# Patient Record
Sex: Female | Born: 1980
Health system: Southern US, Community
[De-identification: ages and names within clinical notes are randomized; demographics above are authoritative.]

## PROBLEM LIST (undated history)

## (undated) DIAGNOSIS — D496 Neoplasm of unspecified behavior of brain: Secondary | ICD-10-CM

## (undated) DIAGNOSIS — R519 Headache, unspecified: Secondary | ICD-10-CM

## (undated) DIAGNOSIS — N390 Urinary tract infection, site not specified: Secondary | ICD-10-CM

## (undated) HISTORY — PX: WISDOM TOOTH EXTRACTION: SHX21

## (undated) HISTORY — PX: ABDOMINAL SURGERY: SHX537

---

## 2003-04-01 DIAGNOSIS — T8859XA Other complications of anesthesia, initial encounter: Secondary | ICD-10-CM

## 2003-04-01 HISTORY — DX: Other complications of anesthesia, initial encounter: T88.59XA

## 2004-09-17 ENCOUNTER — Ambulatory Visit: Payer: Self-pay | Admitting: Internal Medicine

## 2004-09-17 IMAGING — US US PELV - US TRANSVAGINAL
1 series · 14 of 14 positions shown · non-contrast
Comparison: none

REASON FOR EXAM: Lower Abd Pain Bloating
COMMENTS:

[Series 1: us pelv - us transvaginal · 14 of 14 slices shown]
[im 1/14]
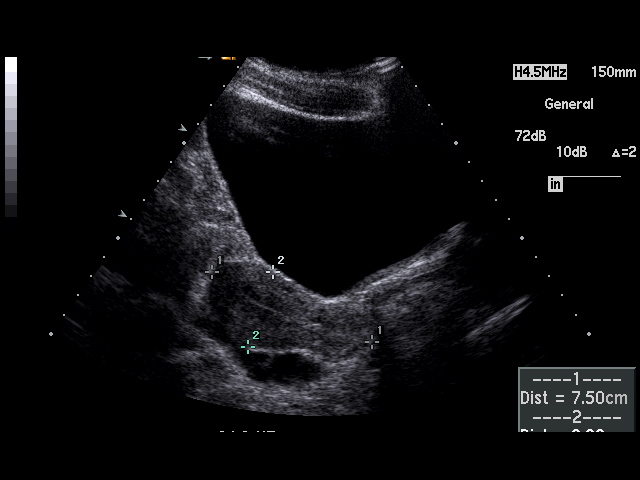
[im 2/14]
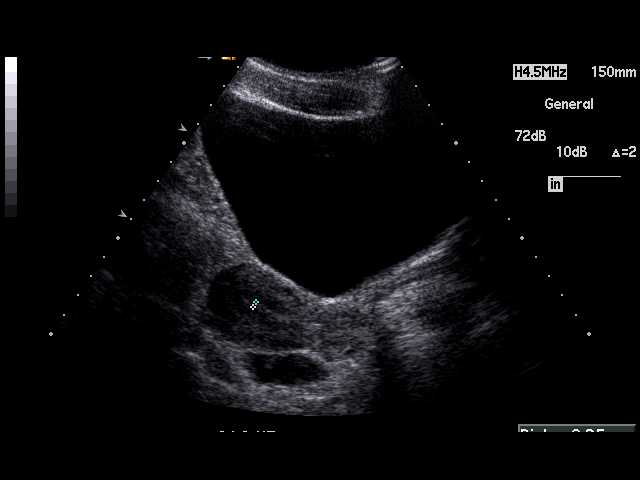
[im 3/14]
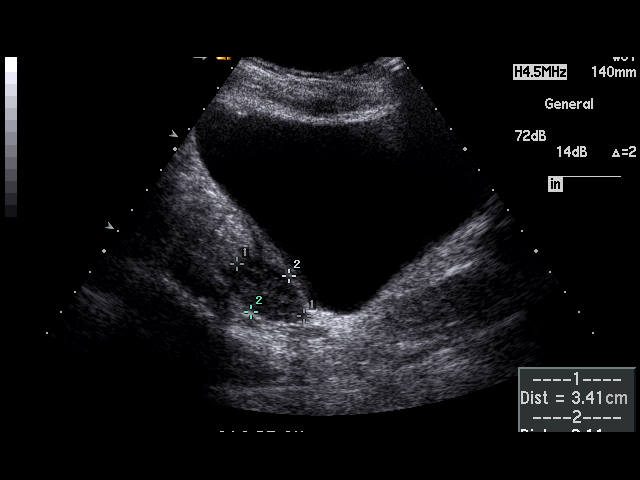
[im 4/14]
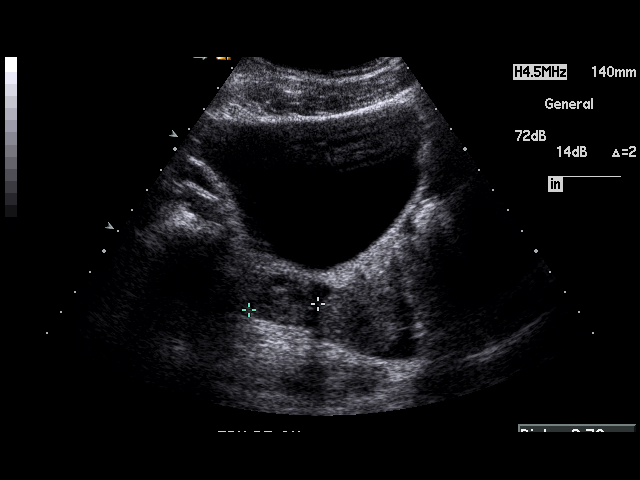
[im 5/14]
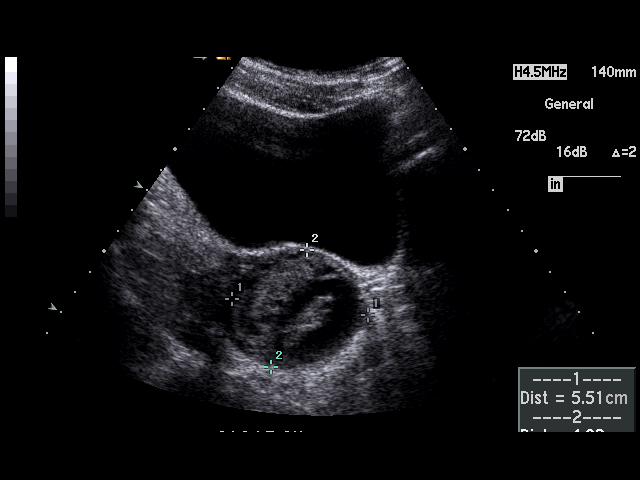
[im 6/14]
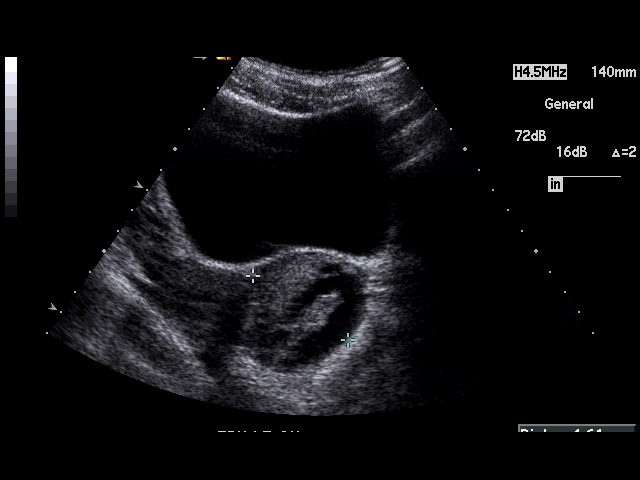
[im 7/14]
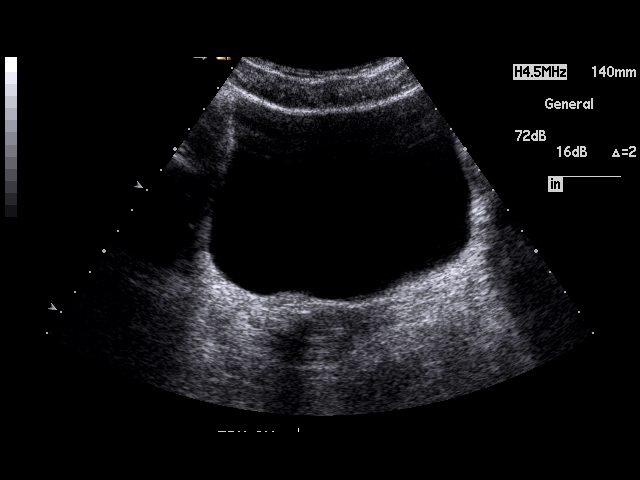
[im 8/14]
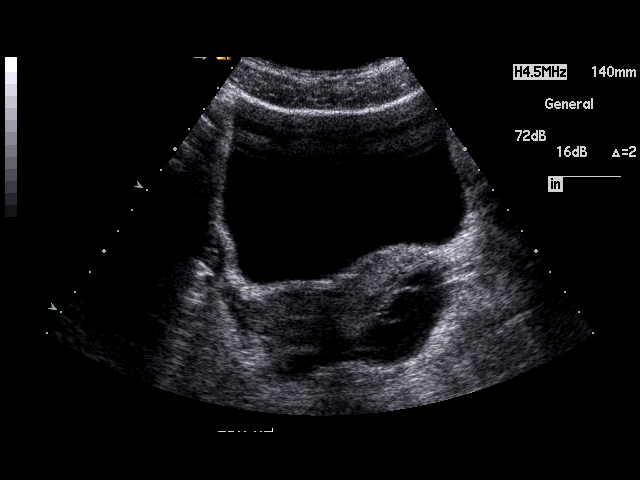
[im 9/14]
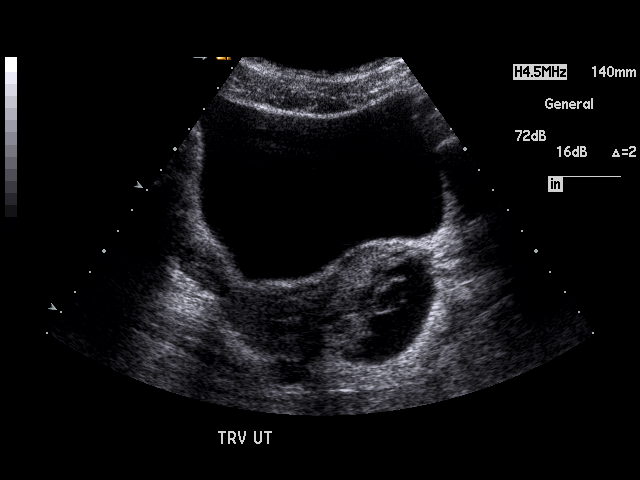
[im 10/14]
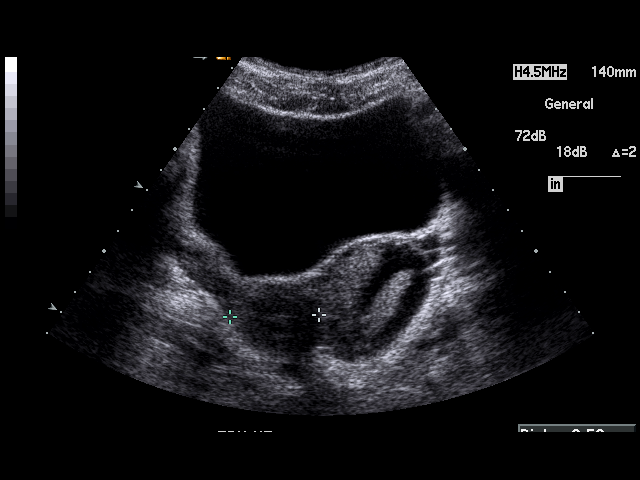
[im 11/14]
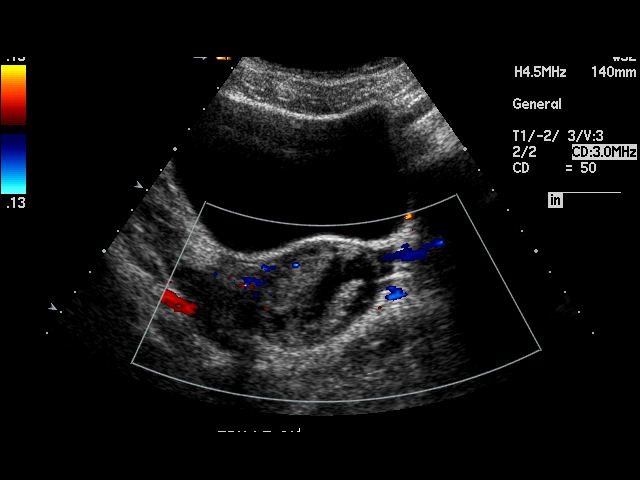
[im 12/14]
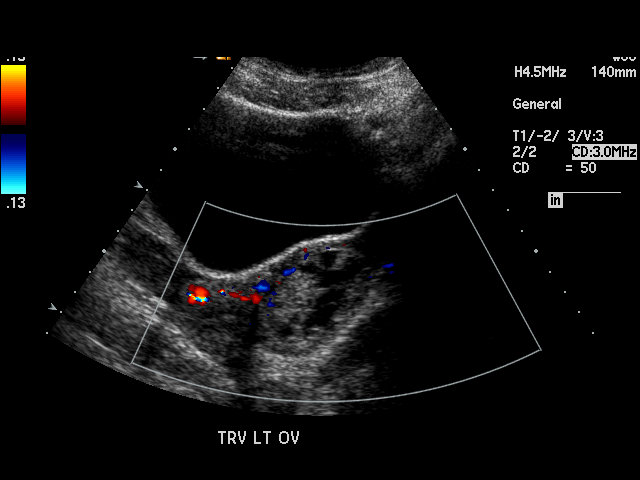
[im 13/14]
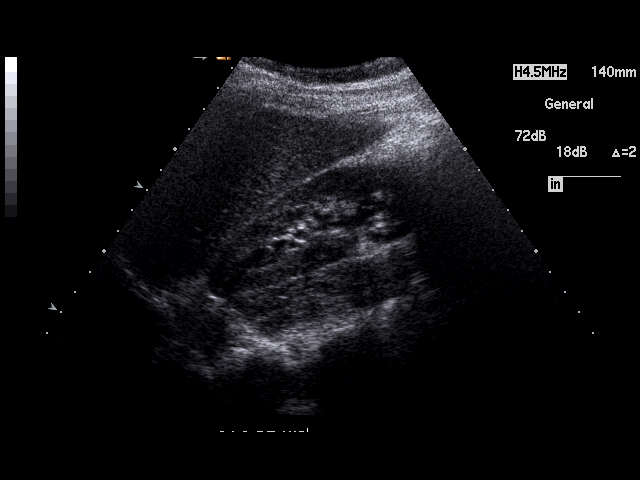
[im 14/14]
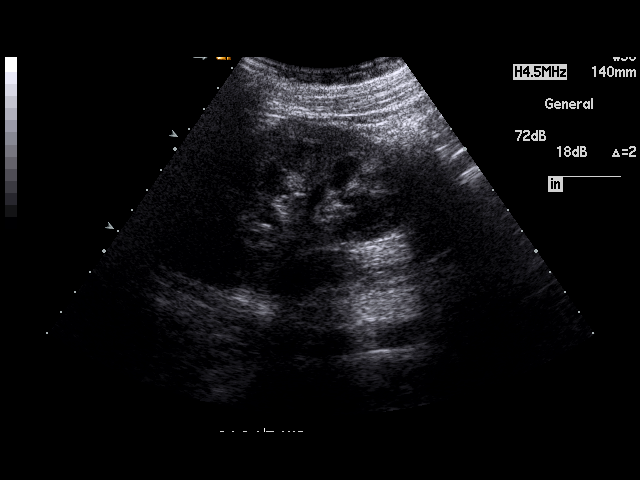

[14 of 14 positions shown; findings below may reference images not displayed]

PROCEDURE:     US  - US PELVIS MASS EXAM  - [DATE] [DATE] [DATE]  [DATE]

RESULT:     Real time imaging was obtained.

The uterus is midline and empty measuring 7.5 x 3.4 cm.  The RIGHT ovary
appears within normal limits.  However, in the region of the LEFT ovary
there is a complex mass measuring 5.5 x 4.9 cm.  The possibility of a large
cyst with some hemorrhage and debris may be of consideration, and a
possibility of possible dermoid although no definitive calcifications are
noted. There is noted some small amount of fluid in the cul-de-sac region.
No hydronephrosis of either kidney is noted.
IMPRESSION: 1)Complex mass identified in the LEFT ovary. One may wish to obtain
follow-up.

## 2004-12-05 ENCOUNTER — Ambulatory Visit: Payer: Self-pay | Admitting: Obstetrics & Gynecology

## 2009-07-19 ENCOUNTER — Inpatient Hospital Stay: Payer: Self-pay | Admitting: Obstetrics & Gynecology

## 2013-08-11 ENCOUNTER — Other Ambulatory Visit: Payer: Self-pay | Admitting: Obstetrics and Gynecology

## 2013-08-11 DIAGNOSIS — E049 Nontoxic goiter, unspecified: Secondary | ICD-10-CM

## 2013-08-16 ENCOUNTER — Ambulatory Visit
Admission: RE | Admit: 2013-08-16 | Discharge: 2013-08-16 | Disposition: A | Discharge status: Home / Self Care | Payer: BC Managed Care – PPO | Source: Ambulatory Visit | Attending: Obstetrics and Gynecology | Admitting: Obstetrics and Gynecology

## 2013-08-16 DIAGNOSIS — E049 Nontoxic goiter, unspecified: Secondary | ICD-10-CM

## 2013-08-16 IMAGING — US US SOFT TISSUE HEAD/NECK
1 series · 14 of 25 positions shown · non-contrast
Comparison: None.

CLINICAL DATA: Enlarged thyroid on physical exam

EXAM:
THYROID ULTRASOUND
TECHNIQUE: Ultrasound examination of the thyroid gland and adjacent soft
tissues was performed.

[Series 1: us soft tissue head/neck · 0.06mm/px · 14 of 33 slices shown]
[im 1/33]
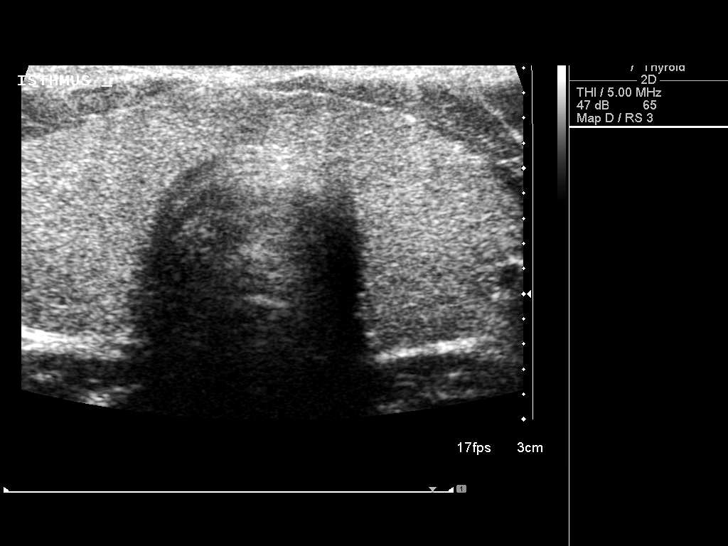
[im 3/33]
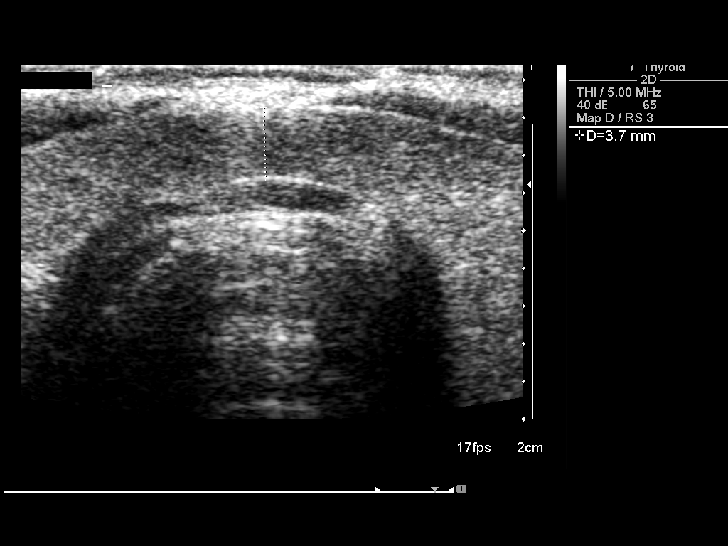
[im 6/33]
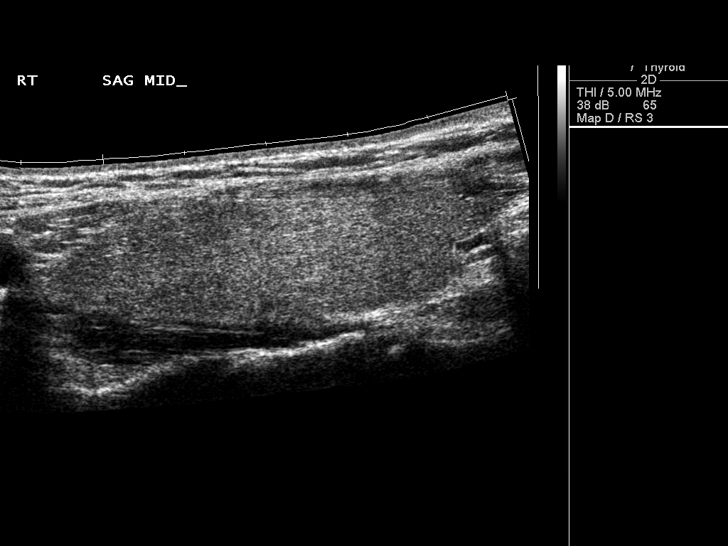
[im 9/33]
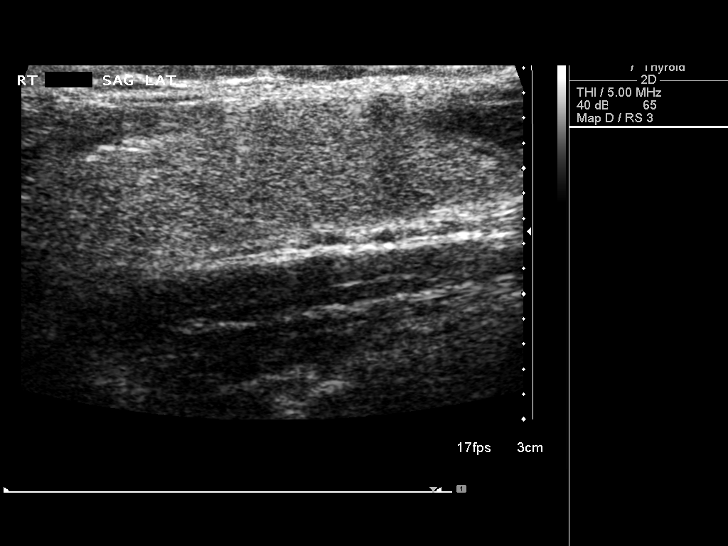
[im 11/33]
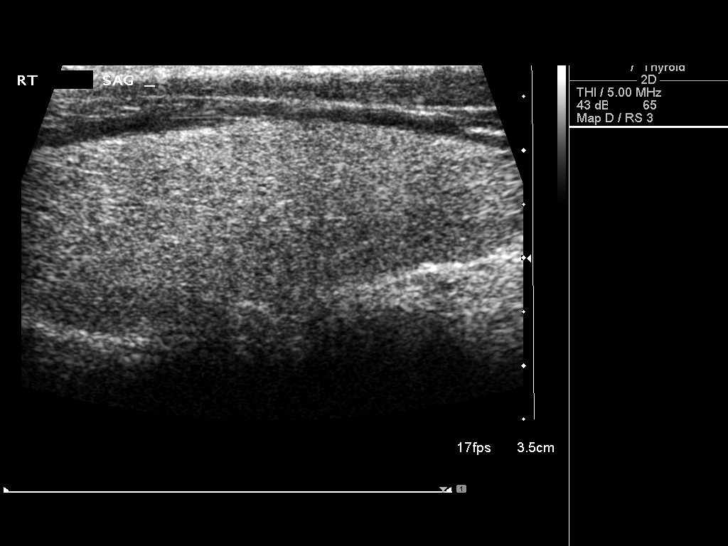
[im 13/33]
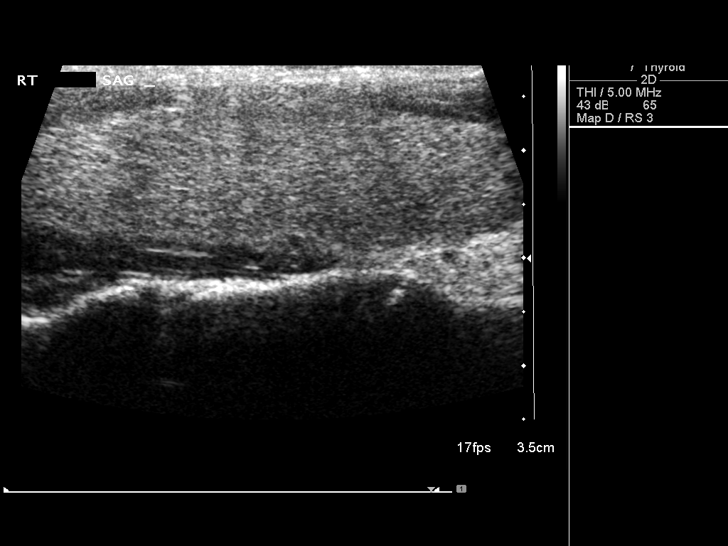
[im 15/33]
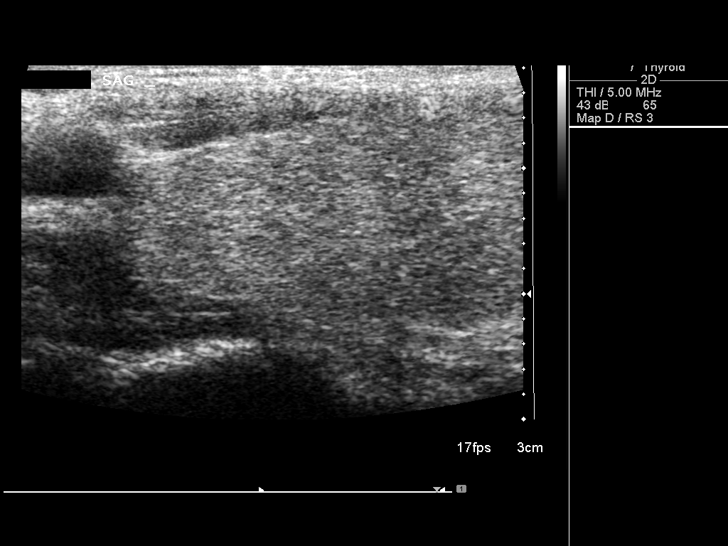
[im 18/33]
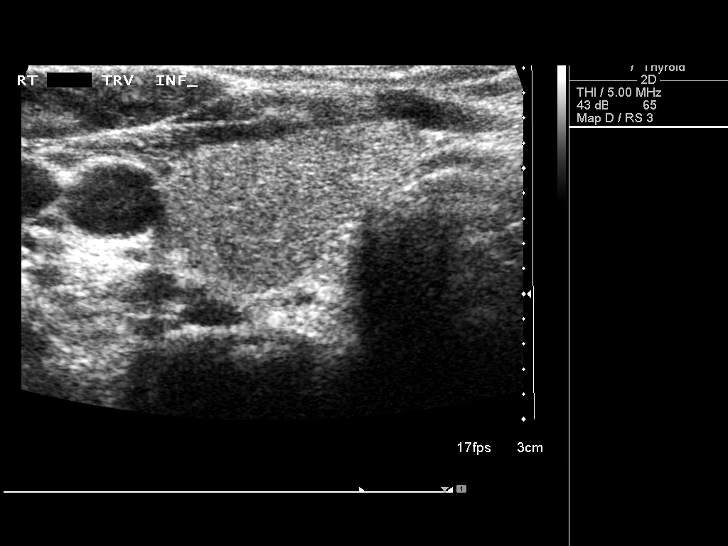
[im 21/33]
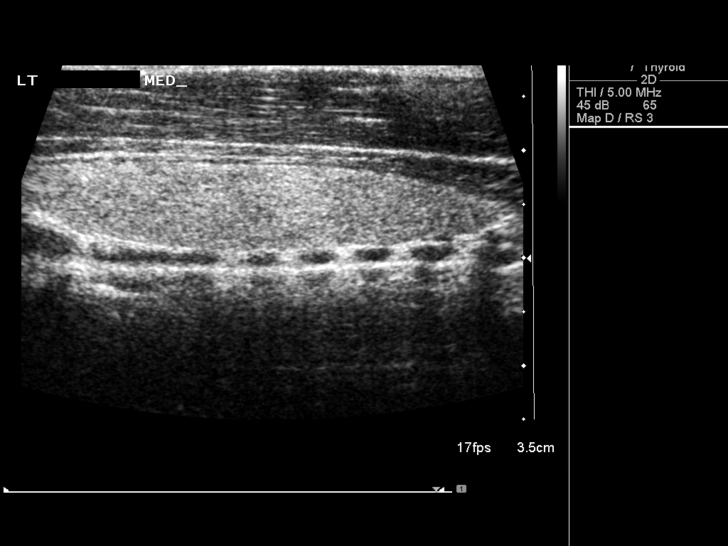
[im 22/33]
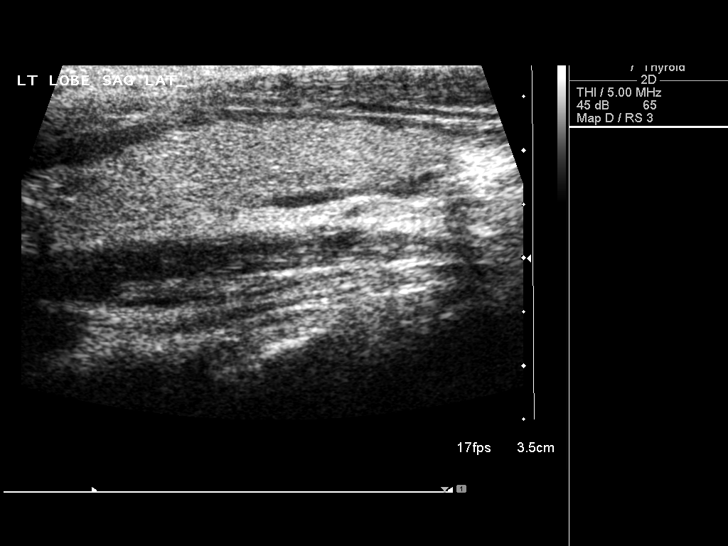
[im 25/33]
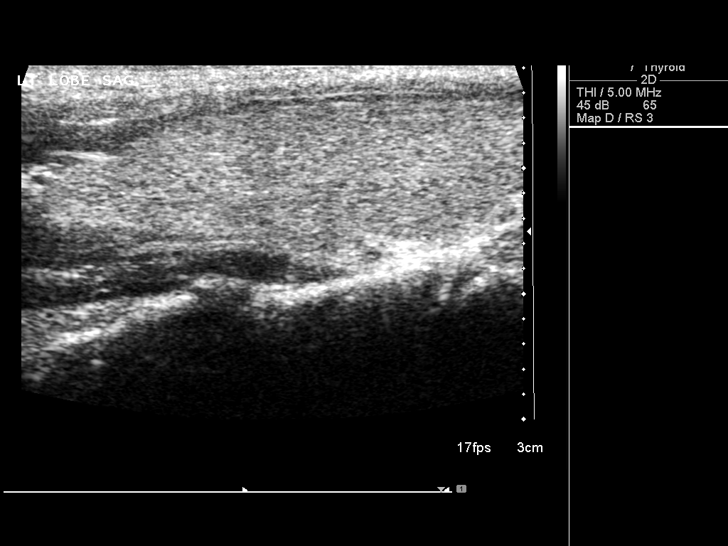
[im 27/33]
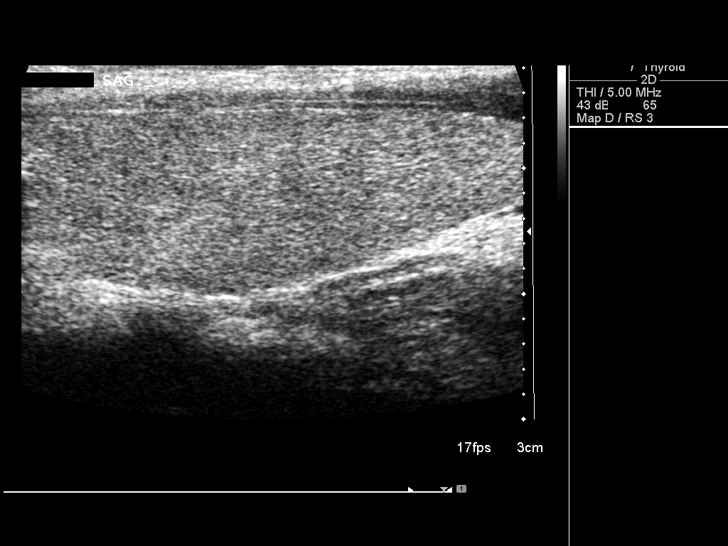
[im 30/33]
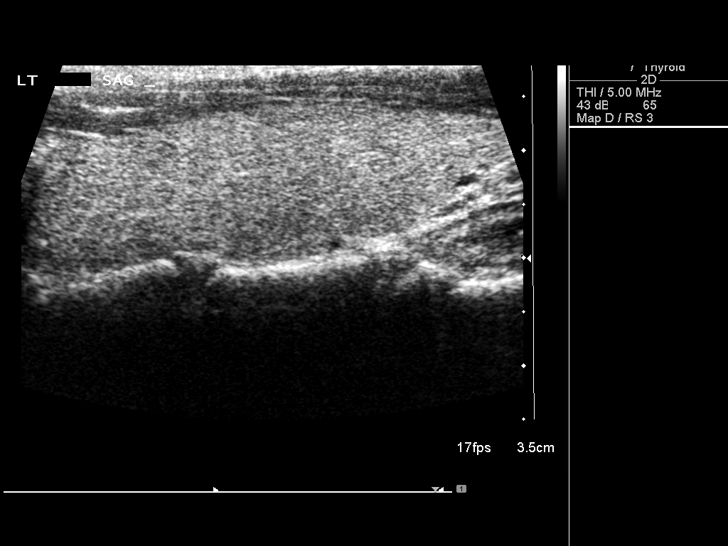
[im 33/33]
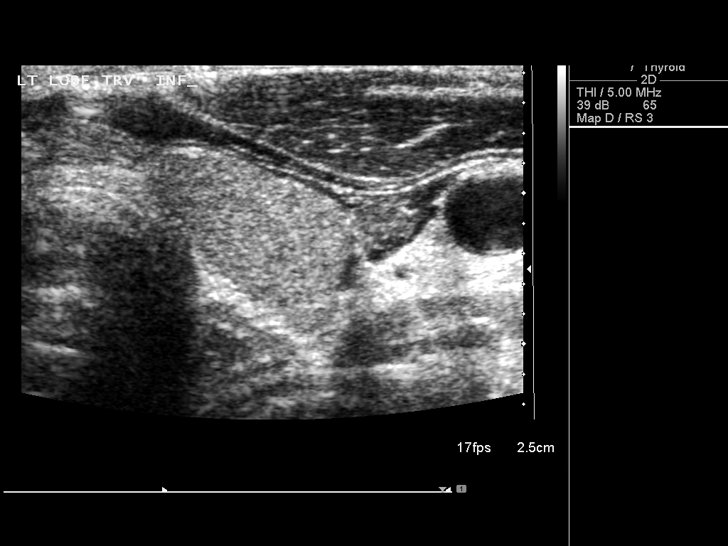

[14 of 25 positions shown; findings below may reference images not displayed]

FINDINGS: Right thyroid lobe

Measurements: 5.8 x 1.6 x 2.0 cm.. No measurable nodules are seen.
The echogenicity of the thyroid parenchyma is homogeneous.

Left thyroid lobe

Measurements: 4.5 x 1.5 x 1.4 cm.. No nodules are evident. The
echogenicity of the thyroid gland is homogeneous.

Isthmus

Thickness: 4 mm in thickness..  No nodules visualized.

Lymphadenopathy

None visualized.
IMPRESSION: The thyroid gland is within upper limits of normal. No solid or
cystic nodules are seen.

## 2018-03-31 DIAGNOSIS — R569 Unspecified convulsions: Secondary | ICD-10-CM

## 2018-03-31 HISTORY — DX: Unspecified convulsions: R56.9

## 2019-12-06 ENCOUNTER — Other Ambulatory Visit: Payer: Self-pay

## 2019-12-06 ENCOUNTER — Emergency Department (HOSPITAL_BASED_OUTPATIENT_CLINIC_OR_DEPARTMENT_OTHER): Payer: Self-pay

## 2019-12-06 ENCOUNTER — Encounter (HOSPITAL_BASED_OUTPATIENT_CLINIC_OR_DEPARTMENT_OTHER): Payer: Self-pay | Admitting: *Deleted

## 2019-12-06 ENCOUNTER — Inpatient Hospital Stay (HOSPITAL_BASED_OUTPATIENT_CLINIC_OR_DEPARTMENT_OTHER)
Admission: EM | Admit: 2019-12-06 | Discharge: 2019-12-09 | DRG: 027 | Disposition: A | Discharge status: Home / Self Care | Payer: Self-pay | Attending: Family Medicine | Admitting: Family Medicine

## 2019-12-06 DIAGNOSIS — Z20822 Contact with and (suspected) exposure to covid-19: Secondary | ICD-10-CM | POA: Diagnosis present

## 2019-12-06 DIAGNOSIS — C711 Malignant neoplasm of frontal lobe: Secondary | ICD-10-CM

## 2019-12-06 DIAGNOSIS — G9389 Other specified disorders of brain: Secondary | ICD-10-CM

## 2019-12-06 DIAGNOSIS — R569 Unspecified convulsions: Secondary | ICD-10-CM | POA: Diagnosis present

## 2019-12-06 DIAGNOSIS — G43909 Migraine, unspecified, not intractable, without status migrainosus: Secondary | ICD-10-CM | POA: Diagnosis present

## 2019-12-06 DIAGNOSIS — D496 Neoplasm of unspecified behavior of brain: Principal | ICD-10-CM | POA: Diagnosis present

## 2019-12-06 DIAGNOSIS — Z880 Allergy status to penicillin: Secondary | ICD-10-CM

## 2019-12-06 DIAGNOSIS — Z8042 Family history of malignant neoplasm of prostate: Secondary | ICD-10-CM

## 2019-12-06 DIAGNOSIS — D649 Anemia, unspecified: Secondary | ICD-10-CM | POA: Diagnosis present

## 2019-12-06 DIAGNOSIS — Z808 Family history of malignant neoplasm of other organs or systems: Secondary | ICD-10-CM

## 2019-12-06 HISTORY — DX: Headache, unspecified: R51.9

## 2019-12-06 LAB — CBC WITH DIFFERENTIAL/PLATELET
Abs Immature Granulocytes: 0.07 10*3/uL (ref 0.00–0.07)
Basophils Absolute: 0 10*3/uL (ref 0.0–0.1)
Basophils Relative: 0 %
Eosinophils Absolute: 0 10*3/uL (ref 0.0–0.5)
Eosinophils Relative: 0 %
HCT: 42.6 % (ref 36.0–46.0)
Hemoglobin: 15.1 g/dL — ABNORMAL HIGH (ref 12.0–15.0)
Immature Granulocytes: 1 %
Lymphocytes Relative: 6 %
Lymphs Abs: 0.8 10*3/uL (ref 0.7–4.0)
MCH: 30.7 pg (ref 26.0–34.0)
MCHC: 35.4 g/dL (ref 30.0–36.0)
MCV: 86.6 fL (ref 80.0–100.0)
Monocytes Absolute: 0.3 10*3/uL (ref 0.1–1.0)
Monocytes Relative: 2 %
Neutro Abs: 11.8 10*3/uL — ABNORMAL HIGH (ref 1.7–7.7)
Neutrophils Relative %: 91 %
Platelets: 295 10*3/uL (ref 150–400)
RBC: 4.92 MIL/uL (ref 3.87–5.11)
RDW: 11.9 % (ref 11.5–15.5)
WBC: 13 10*3/uL — ABNORMAL HIGH (ref 4.0–10.5)
nRBC: 0 % (ref 0.0–0.2)

## 2019-12-06 LAB — COMPREHENSIVE METABOLIC PANEL
ALT: 18 U/L (ref 0–44)
AST: 22 U/L (ref 15–41)
Albumin: 5.1 g/dL — ABNORMAL HIGH (ref 3.5–5.0)
Alkaline Phosphatase: 44 U/L (ref 38–126)
Anion gap: 10 (ref 5–15)
BUN: 13 mg/dL (ref 6–20)
CO2: 24 mmol/L (ref 22–32)
Calcium: 9.2 mg/dL (ref 8.9–10.3)
Chloride: 103 mmol/L (ref 98–111)
Creatinine, Ser: 0.81 mg/dL (ref 0.44–1.00)
GFR calc Af Amer: >60 mL/min (ref >60)
GFR calc non Af Amer: >60 mL/min (ref >60)
Glucose, Bld: 103 mg/dL — ABNORMAL HIGH (ref 70–99)
Potassium: 3.6 mmol/L (ref 3.5–5.1)
Sodium: 137 mmol/L (ref 135–145)
Total Bilirubin: 0.7 mg/dL (ref 0.3–1.2)
Total Protein: 8.2 g/dL — ABNORMAL HIGH (ref 6.5–8.1)

## 2019-12-06 LAB — PREGNANCY, URINE: Preg Test, Ur: NEGATIVE

## 2019-12-06 LAB — SARS CORONAVIRUS 2 BY RT PCR (HOSPITAL ORDER, PERFORMED IN ~~LOC~~ HOSPITAL LAB): SARS Coronavirus 2: NEGATIVE

## 2019-12-06 IMAGING — DX DG CHEST 1V PORT
1 series · 1 of 1 positions shown · non-contrast
Comparison: None.

CLINICAL DATA: Migraine headache, syncope

EXAM:
PORTABLE CHEST 1 VIEW

[chest ap]
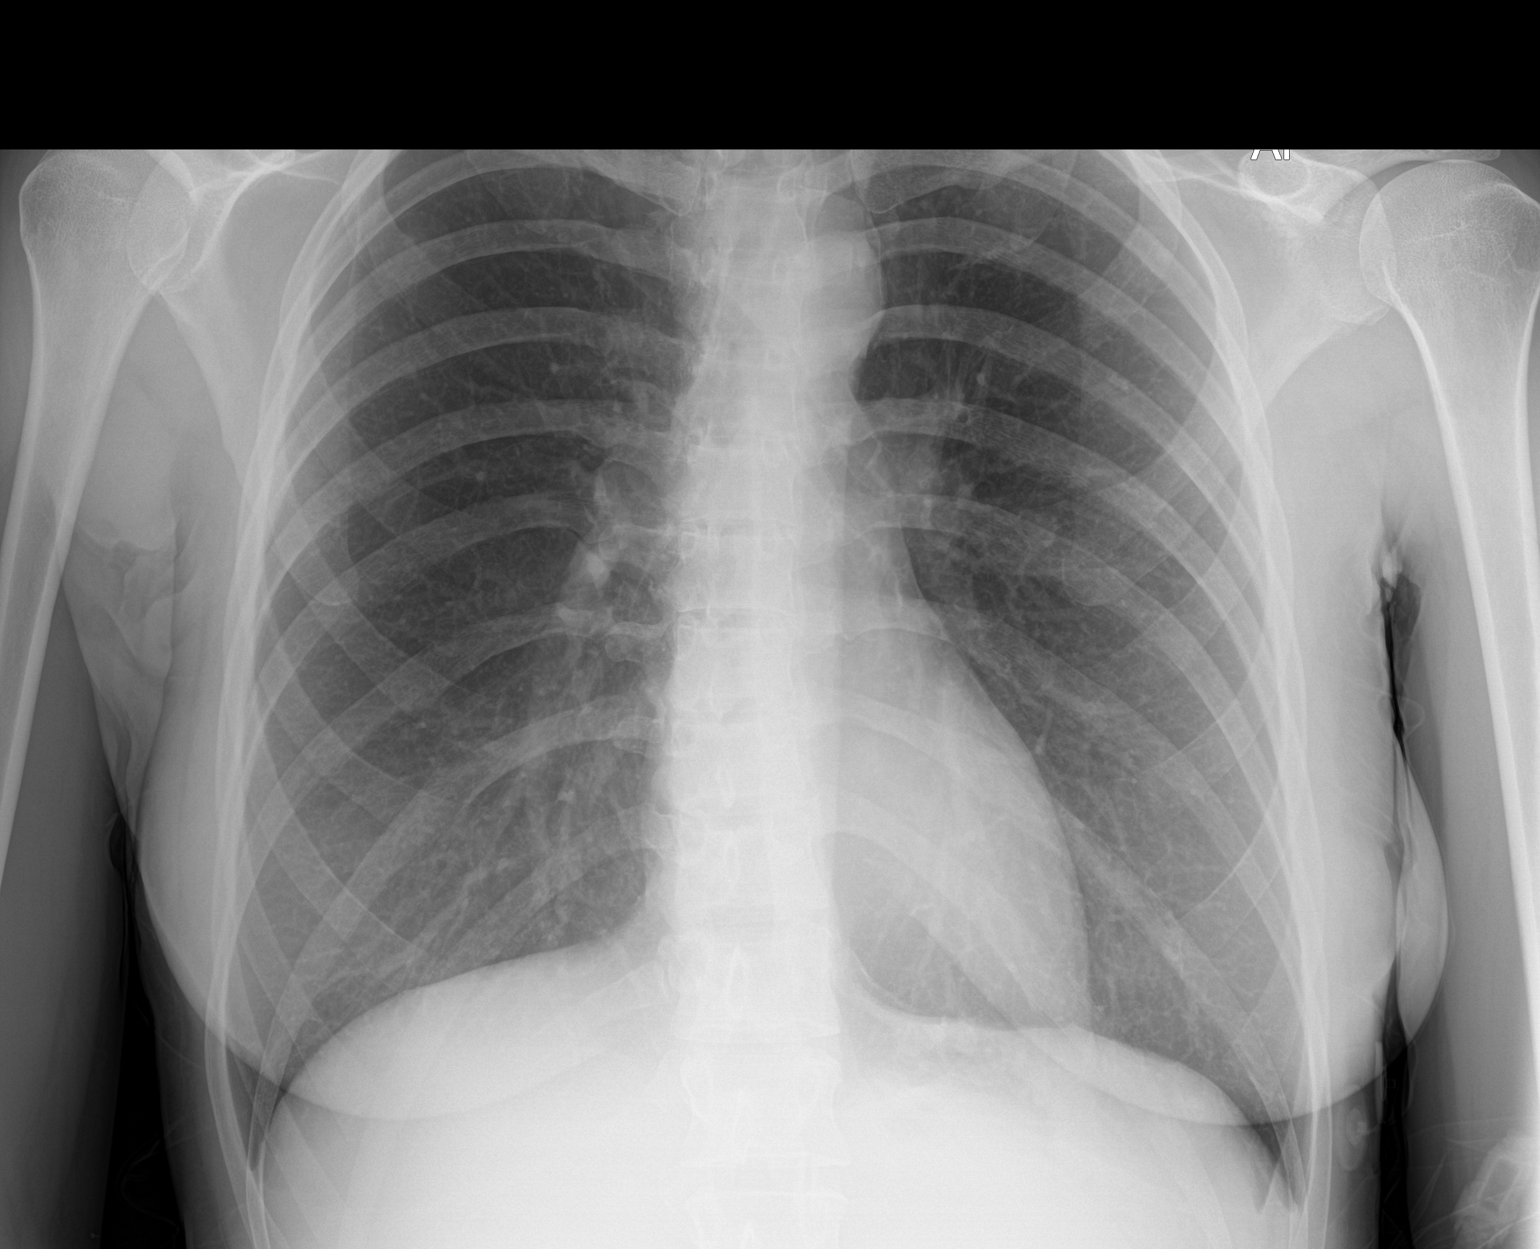

[1 of 1 positions shown; findings below may reference images not displayed]

FINDINGS: The heart size and mediastinal contours are within normal limits.
Both lungs are clear. The visualized skeletal structures are
unremarkable.
IMPRESSION: No active disease.

## 2019-12-06 IMAGING — CT CT HEAD W/O CM
3 series · 16 of 47 positions shown, 19 images · non-contrast
Comparison: None.

CLINICAL DATA: Syncope, slurred speech

EXAM:
CT HEAD WITHOUT CONTRAST
TECHNIQUE: Contiguous axial images were obtained from the base of the skull
through the vertex without intravenous contrast.

[Series 2: head wo · axial · 0.40mm/px · z∈[+1132,+1258]mm · 10 of 30 slices shown, 13 images]
[im 3/30  brain]
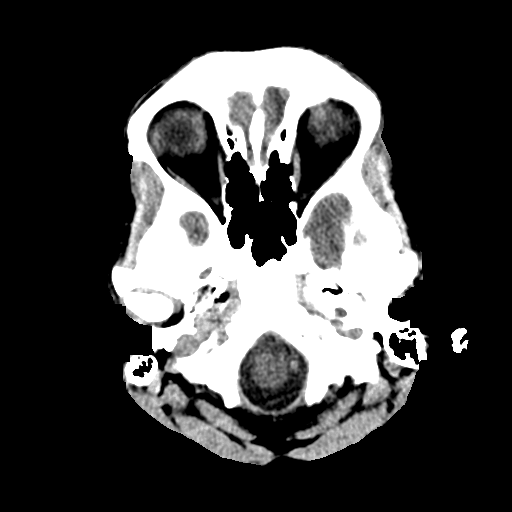
[im 3/30  bone]
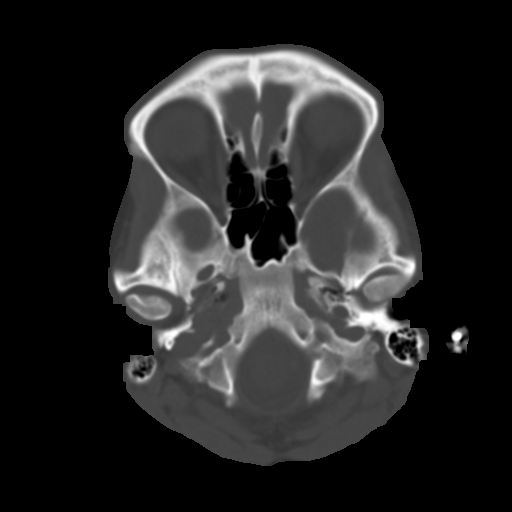
[im 6/30  brain]
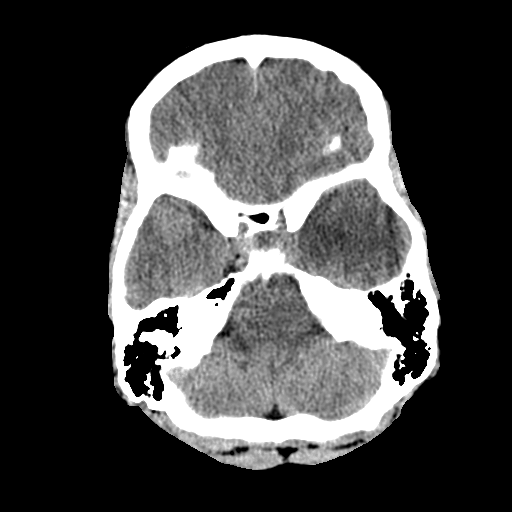
[im 9/30  brain]
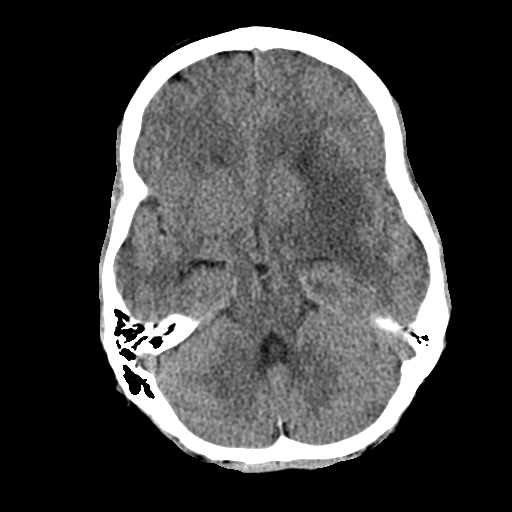
[im 11/30  brain]
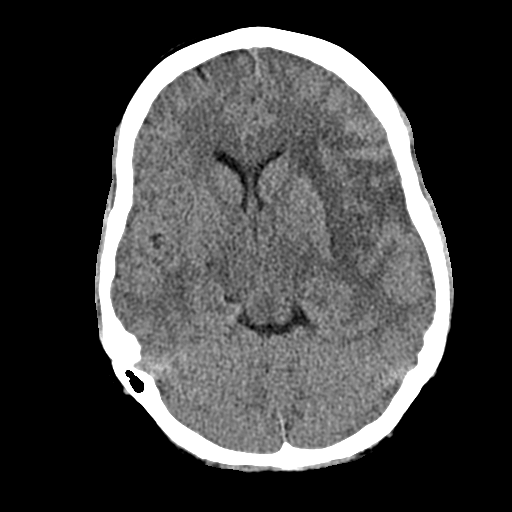
[im 14/30  brain]
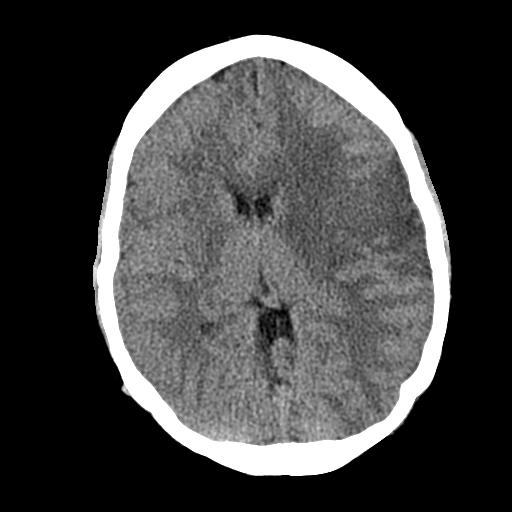
[im 14/30  bone]
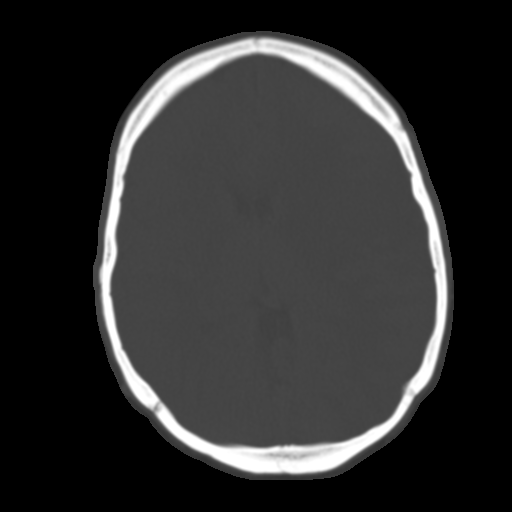
[im 17/30  brain]
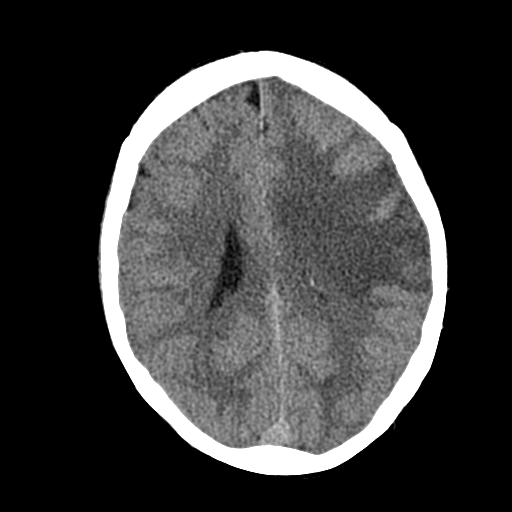
[im 20/30  brain]
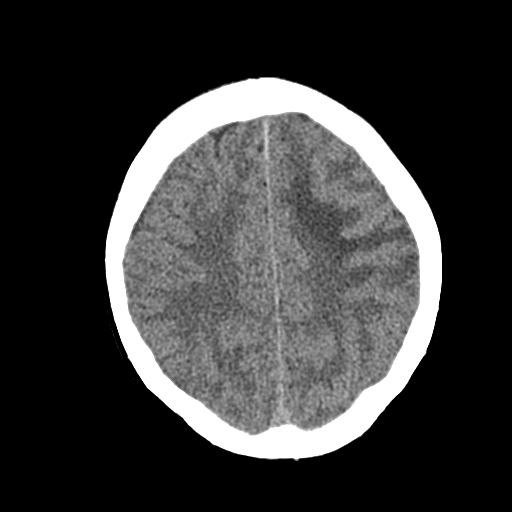
[im 23/30  brain]
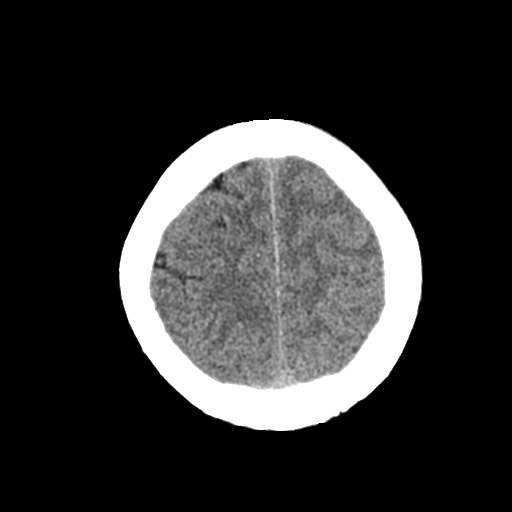
[im 25/30  brain]
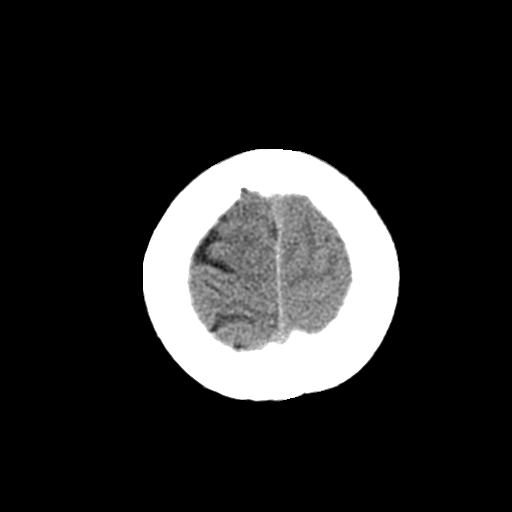
[im 25/30  bone]
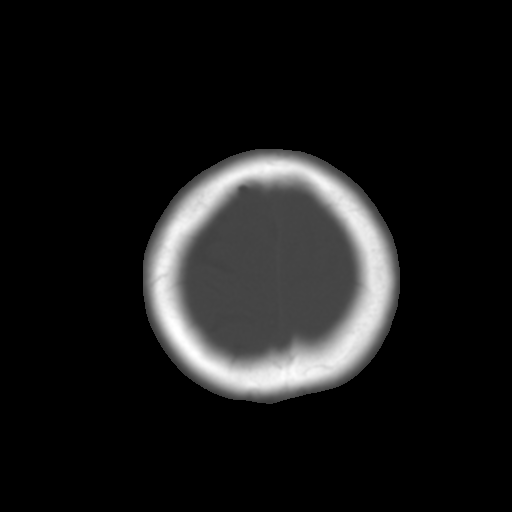
[im 28/30  brain]
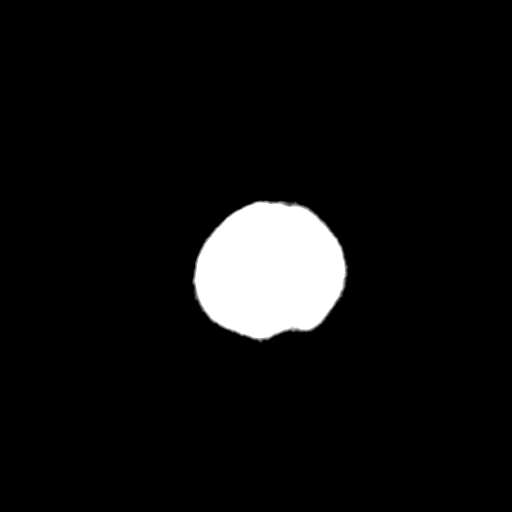

[Series 4: cor soft · coronal · 0.31mm/px · 3 of 63 slices shown]
[im 23/63  brain]
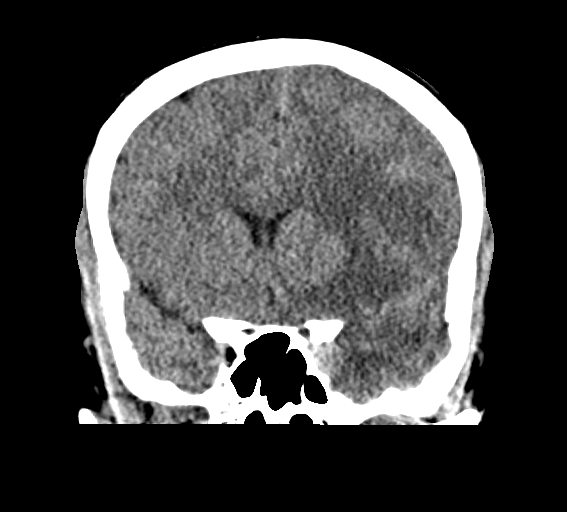
[im 29/63  brain]
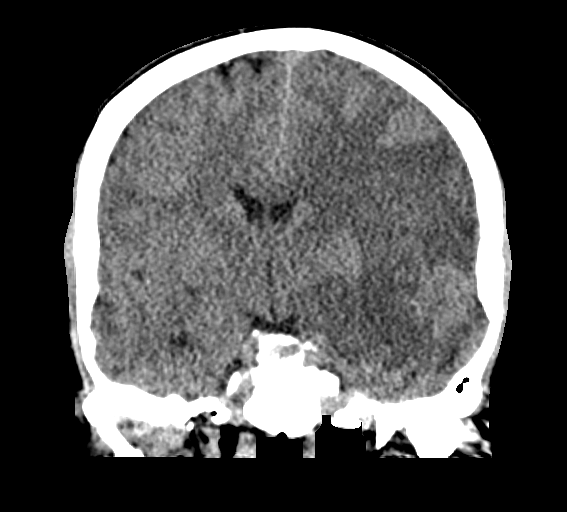
[im 34/63  brain]
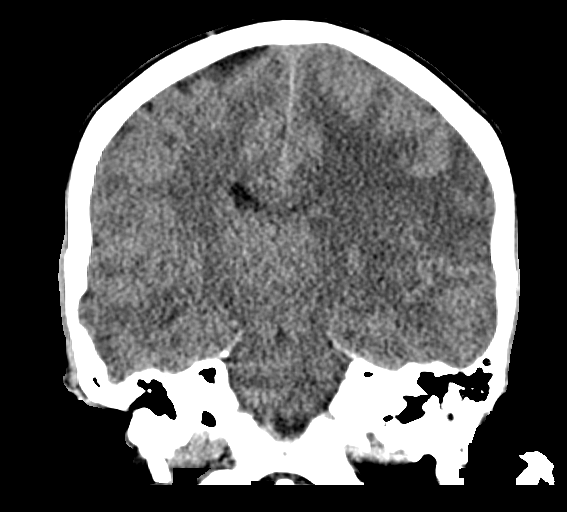

[Series 5: sag soft · sagittal · 0.34mm/px · 3 of 49 slices shown]
[im 17/49  brain]
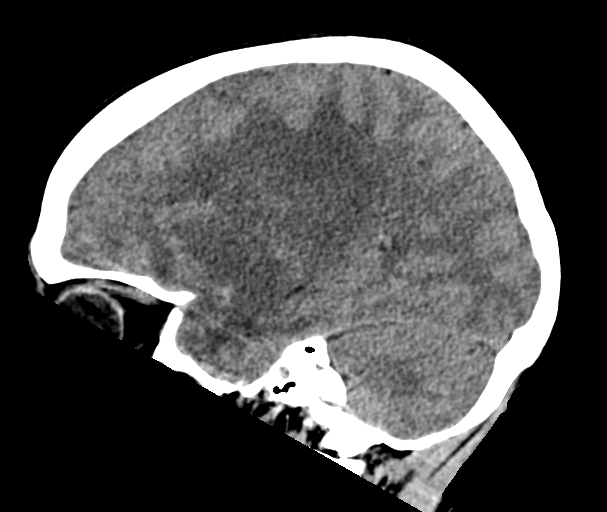
[im 25/49  brain]
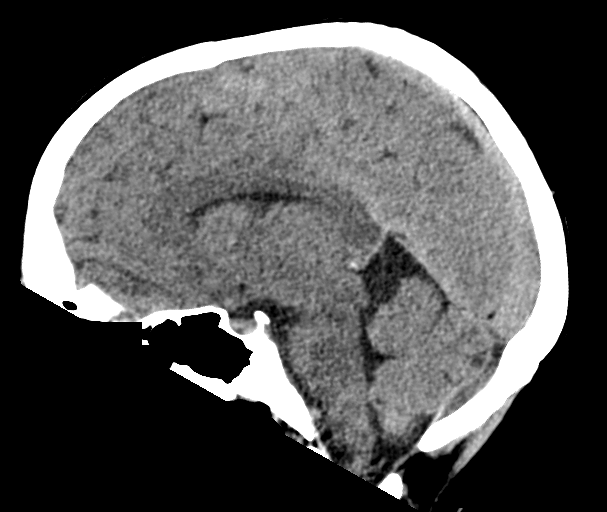
[im 33/49  brain]
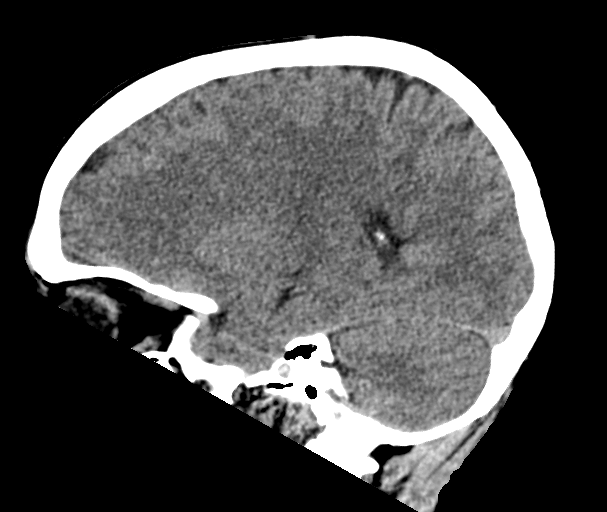

[16 of 47 positions shown; findings below may reference images not displayed]

FINDINGS: Brain: There is low-density/edema throughout the left frontal lobe
and temporal lobe. Appearance is concerning for vasogenic edema
possibly related to underlying mass lesion. Mass effect with 7 mm of
left-to-right midline shift. No hydrocephalus.

Vascular: No hyperdense vessel or unexpected calcification.

Skull: No acute calvarial abnormality.

Sinuses/Orbits: Visualized paranasal sinuses and mastoids clear.
Orbital soft tissues unremarkable.

Other: None
IMPRESSION: Edema throughout the left frontal and temporal lobes concerning for
vasogenic edema and possible underlying mass lesion. Recommend
further evaluation with MRI with and without contrast.

7 mm of left right midline shift.

Critical Value/emergent results were called by telephone at the time
of interpretation on [DATE] at [DATE] to provider ZAFRED
, who verbally acknowledged these results.

## 2019-12-06 MED ORDER — LEVETIRACETAM 500 MG PO TABS
1000.0000 mg | ORAL_TABLET | Freq: Two times a day (BID) | ORAL | Status: DC
  Administered 2019-12-06 – 2019-12-09 (×5): 1000 mg via ORAL
  Filled 2019-12-06 (×6): qty 2

## 2019-12-06 NOTE — ED Triage Notes (Signed)
She was shopping and felt like she was going to have a migraine headache. It is normal for her to get slurred speech and hand numbness during her migraines. She passed out and fell from standing to the carpet covered floor. She is unsure how long she was unresponsive. Nausea after she woke up. Vomiting on the way here. She feels better on arrival. Pale. She never got a migraine headache.

## 2019-12-06 NOTE — ED Provider Notes (Signed)
[facility] EMERGENCY DEPARTMENT Provider Note   CSN: [id number] Arrival date & time: 12/06/19  1753     History No chief complaint on file.   Candace [name] is a 39 y.o. female.  Patient with a history of migraines had an event here today that seem to be similar to her migraines.  Migraines started in her late 1s.  Last episode was in February.  Her usual migraines do not have much of a headache.  But usually have some visual aura beforehand plus or minus slurred speech some face numbness and some hand numbness.  Today patient felt fine until 1545 she was at [employer], in the dressing room.  As she started to get some right leg weakness then right hand numbness and some slurred speech.  She got some facial spasm not sure what side.  She felt as if she was going to pass out and apparently did because she awoke on the floor.  No complaint of headache or neck pain or back pain.  Felt nauseated when she got up on the way here she vomited.  Now feels completely fine.        History reviewed. No pertinent past medical history.  There are no problems to display for this patient.   Past Surgical History:  Procedure Laterality Date  . ABDOMINAL SURGERY    . WISDOM TOOTH EXTRACTION       OB History   No obstetric history on file.     No family history on file.  Social History   Tobacco Use  . Smoking status: Never Smoker  . Smokeless tobacco: Never Used  Substance Use Topics  . Alcohol use: Not Currently  . Drug use: Never    Home Medications Prior to Admission medications   Not on File    Allergies    Patient has no known allergies.  Review of Systems   Review of Systems  Constitutional: Negative for chills and fever.  HENT: Negative for congestion, rhinorrhea and sore throat.   Eyes: Negative for visual disturbance.  Respiratory: Negative for cough and shortness of breath.   Cardiovascular: Negative for chest pain and leg swelling.    Gastrointestinal: Positive for vomiting. Negative for abdominal pain, diarrhea and nausea.  Genitourinary: Negative for dysuria.  Musculoskeletal: Negative for back pain and neck pain.  Skin: Negative for rash.  Neurological: Positive for syncope, speech difficulty, weakness and numbness. Negative for dizziness, light-headedness and headaches.  Hematological: Does not bruise/bleed easily.  Psychiatric/Behavioral: Negative for confusion.    Physical Exam Updated Vital Signs BP 112/62 (BP Location: Left Arm)   Pulse 78   Temp 98.4 F (36.9 C) (Oral)   Resp 15   Ht 1.727 m (5\' 8" )   Wt 60.8 kg   LMP 11/22/2019   SpO2 100%   BMI 20.37 kg/m   Physical Exam Vitals and nursing note reviewed.  Constitutional:      General: She is not in acute distress.    Appearance: Normal appearance. She is well-developed.  HENT:     Head: Normocephalic and atraumatic.  Eyes:     Extraocular Movements: Extraocular movements intact.     Conjunctiva/sclera: Conjunctivae normal.     Pupils: Pupils are equal, round, and reactive to light.  Cardiovascular:     Rate and Rhythm: Normal rate and regular rhythm.     Heart sounds: No murmur heard.   Pulmonary:     Effort: Pulmonary effort is normal. No respiratory distress.  Breath sounds: Normal breath sounds.  Abdominal:     Palpations: Abdomen is soft.     Tenderness: There is no abdominal tenderness.  Musculoskeletal:        General: No swelling. Normal range of motion.     Cervical back: Normal range of motion and neck supple.  Skin:    General: Skin is warm and dry.  Neurological:     General: No focal deficit present.     Mental Status: She is alert and oriented to person, place, and time.     Cranial Nerves: No cranial nerve deficit.     Sensory: No sensory deficit.     Motor: No weakness.     Coordination: Coordination normal.     ED Results / Procedures / Treatments   Labs (all labs ordered are listed, but only abnormal  results are displayed) Labs Reviewed  COMPREHENSIVE METABOLIC PANEL - Abnormal; Notable for the following components:      Result Value   Glucose, Bld 103 (*)    Total Protein 8.2 (*)    Albumin 5.1 (*)    All other components within normal limits  CBC WITH DIFFERENTIAL/PLATELET - Abnormal; Notable for the following components:   WBC 13.0 (*)    Hemoglobin 15.1 (*)    Neutro Abs 11.8 (*)    All other components within normal limits  SARS CORONAVIRUS 2 BY RT PCR [facility] ORDER, [place] [facility])  PREGNANCY, URINE    EKG EKG Interpretation  Date/Time:  Tuesday December 06 2019 18:34:33 EDT Ventricular Rate:  86 PR Interval:  112 QRS Duration: 80 QT Interval:  362 QTC Calculation: 433 R Axis:   89 Text Interpretation: Normal sinus rhythm ST & T wave abnormality, consider inferolateral ischemia Abnormal ECG No previous ECGs available Confirmed by Fredia Sorrow [phone]) on 12/06/2019 9:26:39 PM   Radiology CT Head Wo Contrast  Result Date: 12/06/2019 CLINICAL DATA:  Syncope, slurred speech EXAM: CT HEAD WITHOUT CONTRAST TECHNIQUE: Contiguous axial images were obtained from the base of the skull through the vertex without intravenous contrast. COMPARISON:  None. FINDINGS: Brain: There is low-density/edema throughout the left frontal lobe and temporal lobe. Appearance is concerning for vasogenic edema possibly related to underlying mass lesion. Mass effect with 7 mm of left-to-right midline shift. No hydrocephalus. Vascular: No hyperdense vessel or unexpected calcification. Skull: No acute calvarial abnormality. Sinuses/Orbits: Visualized paranasal sinuses and mastoids clear. Orbital soft tissues unremarkable. Other: None IMPRESSION: Edema throughout the left frontal and temporal lobes concerning for vasogenic edema and possible underlying mass lesion. Recommend further evaluation with MRI with and without contrast. 7 mm of left right midline shift. Critical  Value/emergent results were called by telephone at the time of interpretation on 12/06/2019 at 7:43 pm to provider Fredia Sorrow , who verbally acknowledged these results. Electronically Signed   By: Rolm Baptise M.D.   On: 12/06/2019 19:46   DG Chest Port 1 View  Result Date: 12/06/2019 CLINICAL DATA:  Migraine headache, syncope EXAM: PORTABLE CHEST 1 VIEW COMPARISON:  None. FINDINGS: The heart size and mediastinal contours are within normal limits. Both lungs are clear. The visualized skeletal structures are unremarkable. IMPRESSION: No active disease. Electronically Signed   By: Randa Ngo M.D.   On: 12/06/2019 21:00    Procedures Procedures (including critical care time)  CRITICAL CARE Performed by: Fredia Sorrow Total critical care time: 35 minutes Critical care time was exclusive of separately billable procedures and treating other patients. Critical care  was necessary to treat or prevent imminent or life-threatening deterioration. Critical care was time spent personally by me on the following activities: development of treatment plan with patient and/or surrogate as well as nursing, discussions with consultants, evaluation of patient's response to treatment, examination of patient, obtaining history from patient or surrogate, ordering and performing treatments and interventions, ordering and review of laboratory studies, ordering and review of radiographic studies, pulse oximetry and re-evaluation of patient's condition.   Medications Ordered in ED Medications  levETIRAcetam (KEPPRA) tablet 1,000 mg (1,000 mg Oral Given 12/06/19 2306)    ED Course  I have reviewed the triage vital signs and the nursing notes.  Pertinent labs & imaging results that were available during my care of the patient were reviewed by me and considered in my medical decision making (see chart for details).    MDM Rules/Calculators/A&P                          Discussed with neuro hospitalist on call  Dr. Curly Shores.  CT brain with a lot of edema left frontal and temporal lobes are concerning for vasogenic edema.  Concerning for underlying brain mass.  Neuro hospitalist feels that today's events due to focal seizure.  She recommended 1000 mg of Keppra twice daily and to continue that.  First dose given here.  Patient has an IV.  Based on the CT findings I will do MRI with and without.  Patient will be transferred into [facility] emergency department Dr. Nicholes Stairs excepting.  The neuro hospitalist wants to be called when she arrived so that she can see her right away.  Other labs here today without significant findings other than a white count of 13,000.  Renal function is normal.         Final Clinical Impression(s) / ED Diagnoses Final diagnoses:  Focal seizure [facility])    Rx / DC Orders ED Discharge Orders    None       Fredia Sorrow, MD 12/06/19 2316

## 2019-12-07 ENCOUNTER — Other Ambulatory Visit: Payer: Self-pay | Admitting: Neurosurgery

## 2019-12-07 ENCOUNTER — Encounter (HOSPITAL_COMMUNITY): Payer: Self-pay

## 2019-12-07 ENCOUNTER — Emergency Department (HOSPITAL_COMMUNITY): Payer: Self-pay

## 2019-12-07 ENCOUNTER — Inpatient Hospital Stay (HOSPITAL_COMMUNITY): Payer: Self-pay

## 2019-12-07 ENCOUNTER — Other Ambulatory Visit: Payer: Self-pay | Admitting: Radiation Therapy

## 2019-12-07 DIAGNOSIS — R569 Unspecified convulsions: Secondary | ICD-10-CM

## 2019-12-07 DIAGNOSIS — C711 Malignant neoplasm of frontal lobe: Secondary | ICD-10-CM

## 2019-12-07 LAB — CBC WITH DIFFERENTIAL/PLATELET
Abs Immature Granulocytes: 0.02 10*3/uL (ref 0.00–0.07)
Basophils Absolute: 0 10*3/uL (ref 0.0–0.1)
Basophils Relative: 0 %
Eosinophils Absolute: 0 10*3/uL (ref 0.0–0.5)
Eosinophils Relative: 0 %
HCT: 37.2 % (ref 36.0–46.0)
Hemoglobin: 12.5 g/dL (ref 12.0–15.0)
Immature Granulocytes: 0 %
Lymphocytes Relative: 22 %
Lymphs Abs: 1.6 10*3/uL (ref 0.7–4.0)
MCH: 29.7 pg (ref 26.0–34.0)
MCHC: 33.6 g/dL (ref 30.0–36.0)
MCV: 88.4 fL (ref 80.0–100.0)
Monocytes Absolute: 0.6 10*3/uL (ref 0.1–1.0)
Monocytes Relative: 8 %
Neutro Abs: 4.9 10*3/uL (ref 1.7–7.7)
Neutrophils Relative %: 70 %
Platelets: 260 10*3/uL (ref 150–400)
RBC: 4.21 MIL/uL (ref 3.87–5.11)
RDW: 11.8 % (ref 11.5–15.5)
WBC: 7.1 10*3/uL (ref 4.0–10.5)
nRBC: 0 % (ref 0.0–0.2)

## 2019-12-07 LAB — COMPREHENSIVE METABOLIC PANEL
ALT: 15 U/L (ref 0–44)
AST: 24 U/L (ref 15–41)
Albumin: 3.6 g/dL (ref 3.5–5.0)
Alkaline Phosphatase: 33 U/L — ABNORMAL LOW (ref 38–126)
Anion gap: 9 (ref 5–15)
BUN: 9 mg/dL (ref 6–20)
CO2: 20 mmol/L — ABNORMAL LOW (ref 22–32)
Calcium: 8.3 mg/dL — ABNORMAL LOW (ref 8.9–10.3)
Chloride: 109 mmol/L (ref 98–111)
Creatinine, Ser: 0.91 mg/dL (ref 0.44–1.00)
GFR calc Af Amer: >60 mL/min (ref >60)
GFR calc non Af Amer: >60 mL/min (ref >60)
Glucose, Bld: 90 mg/dL (ref 70–99)
Potassium: 3.9 mmol/L (ref 3.5–5.1)
Sodium: 138 mmol/L (ref 135–145)
Total Bilirubin: 0.9 mg/dL (ref 0.3–1.2)
Total Protein: 6 g/dL — ABNORMAL LOW (ref 6.5–8.1)

## 2019-12-07 LAB — ABO/RH: ABO/RH(D): B POS

## 2019-12-07 LAB — PROTIME-INR
INR: 1.2 (ref 0.8–1.2)
Prothrombin Time: 14.3 seconds (ref 11.4–15.2)

## 2019-12-07 LAB — TYPE AND SCREEN
ABO/RH(D): B POS
Antibody Screen: NEGATIVE

## 2019-12-07 LAB — HIV ANTIBODY (ROUTINE TESTING W REFLEX): HIV Screen 4th Generation wRfx: NONREACTIVE

## 2019-12-07 LAB — APTT: aPTT: 26 seconds (ref 24–36)

## 2019-12-07 IMAGING — MR MR HEAD WO/W CM
14 of 16 series · 40 of 48 positions shown · IV contrast (gadavist)
Comparison: Comparison made with prior CT from [DATE].

CLINICAL DATA: Initial evaluation for acute seizure, possible mass
lesion.

EXAM:
MRI HEAD WITHOUT AND WITH CONTRAST
TECHNIQUE: Multiplanar, multiecho pulse sequences of the brain and surrounding
structures were obtained without and with intravenous contrast.
CONTRAST:  6mL GADAVIST GADOBUTROL 1 MMOL/ML IV SOLN

[Series 5: DWI · axial · 3.0mm · 0.88mm/px · z∈[-81,+65]mm · 7 of 100 slices shown (1 of 4)]
[im 1/100]
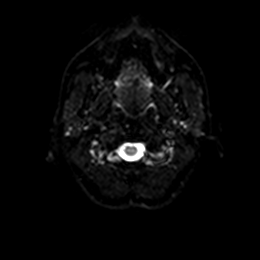
[im 17/100]
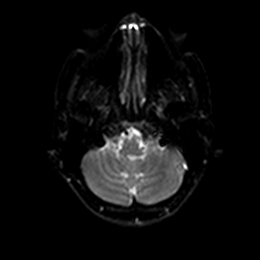
[im 34/100]
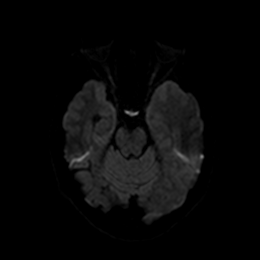
[im 50/100]
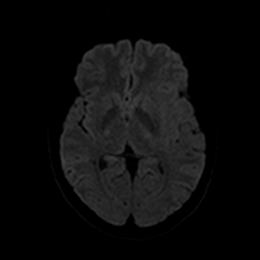
[im 67/100]
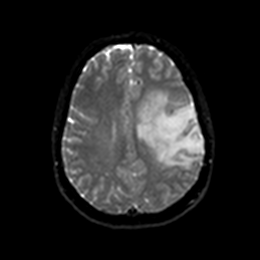
[im 83/100]
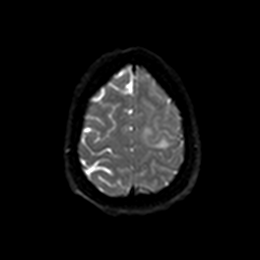
[im 100/100]
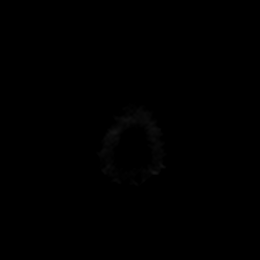

[Series 6: DWI · axial · 3.0mm · 0.88mm/px · z∈[-81,+65]mm · 3 of 50 slices shown (2 of 4)]
[im 1/50]
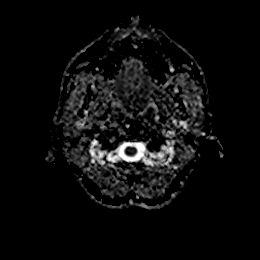
[im 25/50]
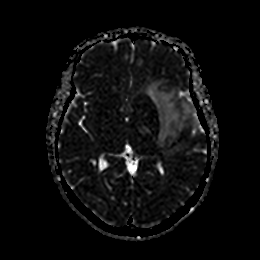
[im 50/50]
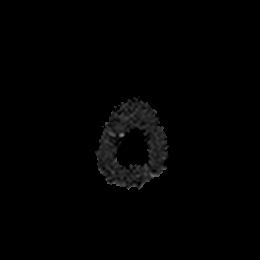

[Series 7: DWI · coronal · 4.0mm · 0.88mm/px · 5 of 66 slices shown (3 of 4)]
[im 1/66]
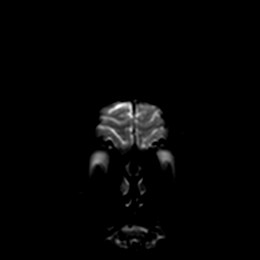
[im 17/66]
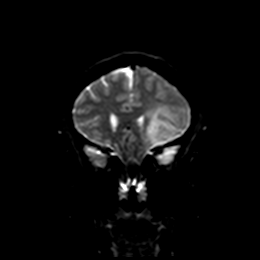
[im 33/66]
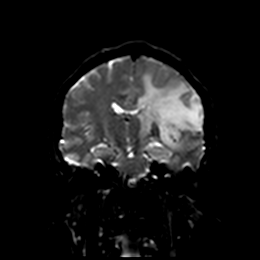
[im 49/66]
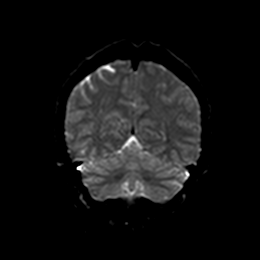
[im 66/66]
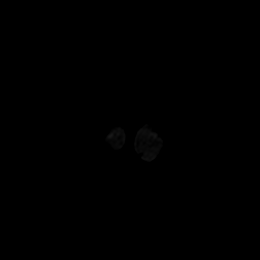

[Series 8: DWI · coronal · 4.0mm · 0.88mm/px · 2 of 33 slices shown (4 of 4)]
[im 1/33]
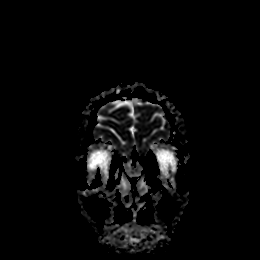
[im 33/33]
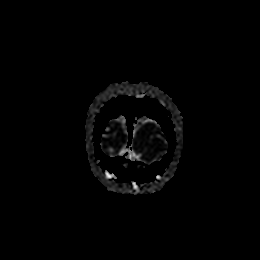

[Series 9: T1 · sagittal · 5.0mm · 0.75mm/px · 2 of 25 slices shown]
[im 1/25]
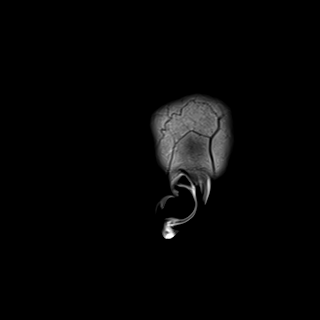
[im 25/25]
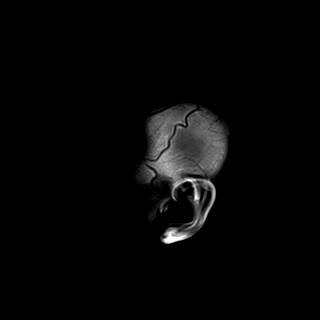

[Series 10: T2 · axial · 5.0mm · 0.72mm/px · z∈[-78,+66]mm · 2 of 25 slices shown (1 of 2)]
[im 1/25]
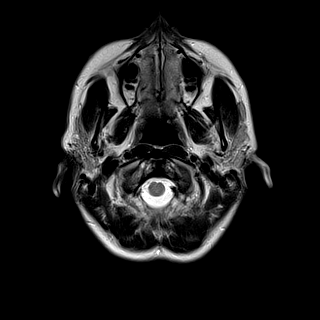
[im 25/25]
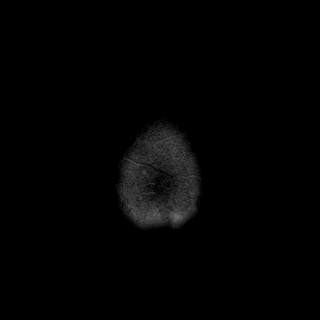

[Series 11: FLAIR · axial · 5.0mm · 0.45mm/px · z∈[-79,+65]mm · 2 of 25 slices shown (1 of 2)]
[im 1/25]
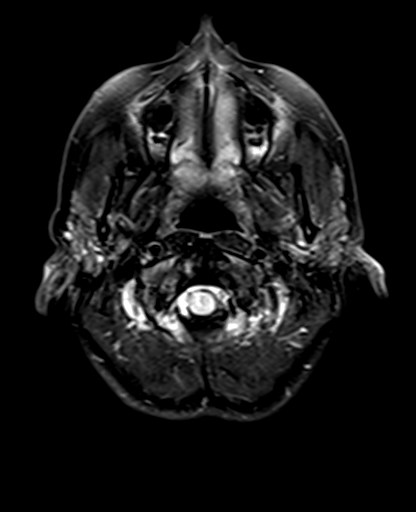
[im 25/25]
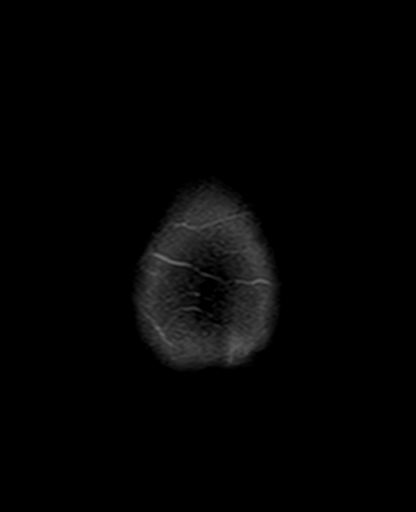

[Series 13: pha_images · axial · 3.0mm · 0.90mm/px · z∈[-95,+70]mm · 4 of 56 slices shown]
[im 1/56]
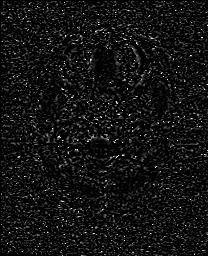
[im 19/56]
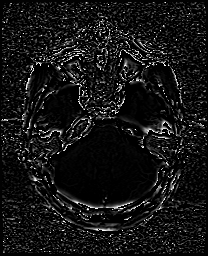
[im 37/56]
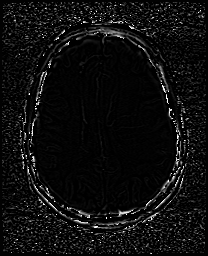
[im 56/56]
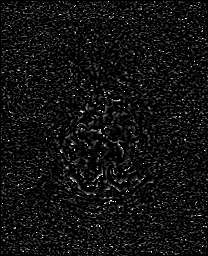

[Series 14: swi_images · axial · 3.0mm · 0.90mm/px · z∈[-95,+82]mm · 4 of 60 slices shown]
[im 1/60]
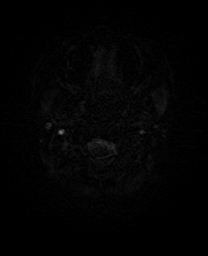
[im 20/60]
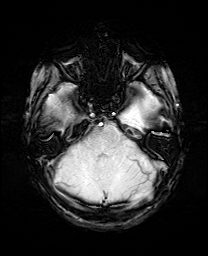
[im 40/60]
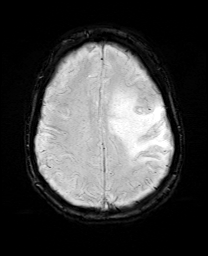
[im 60/60]
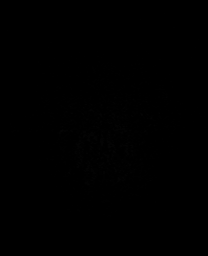

[Series 17: T2 · coronal · 3.0mm · 0.27mm/px · 2 of 32 slices shown (2 of 2)]
[im 1/32]
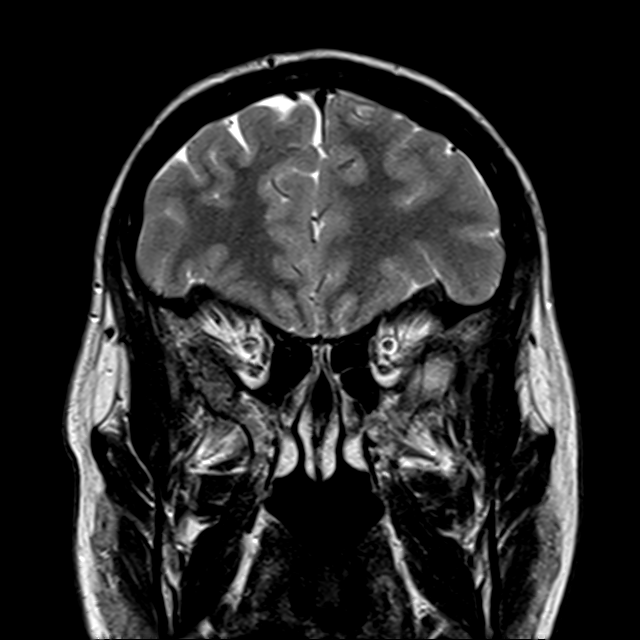
[im 32/32]
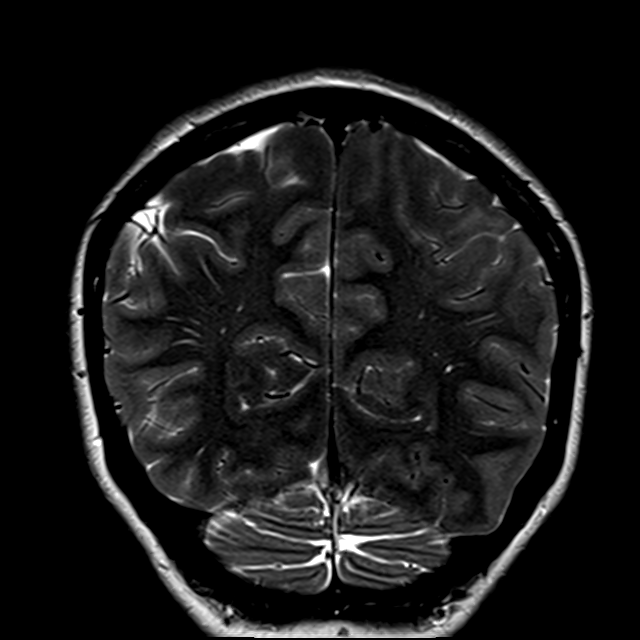

[Series 18: FLAIR · coronal · 3.0mm · 0.56mm/px · 1 of 21 slices shown (2 of 2)]
[im 1/21]
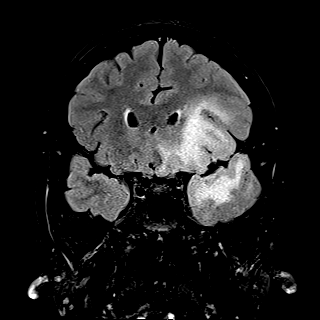

[Series 19: T2 post-contrast · coronal · 5.0mm · 0.72mm/px · 2 of 28 slices shown]
[im 1/28]
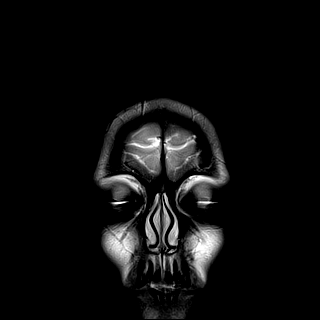
[im 28/28]
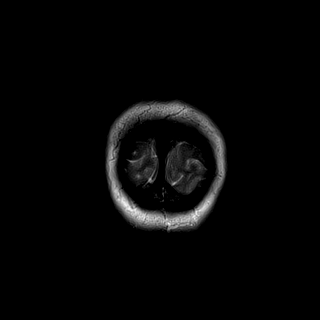

[Series 21: T1 post-contrast · coronal · 5.0mm · 0.34mm/px · 2 of 28 slices shown (1 of 2)]
[im 1/28]
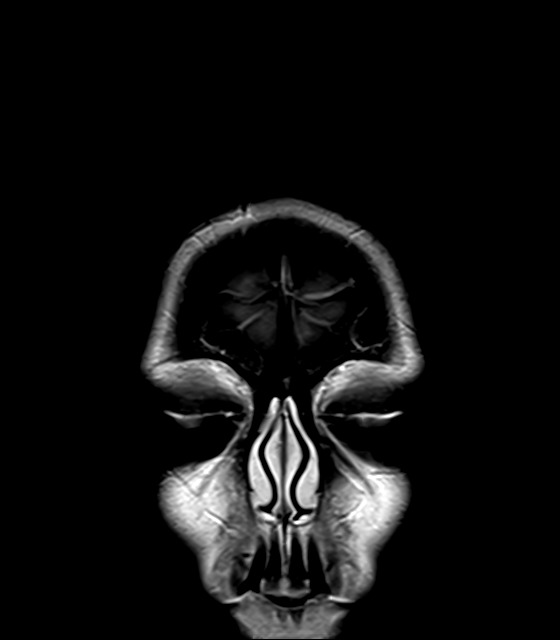
[im 28/28]
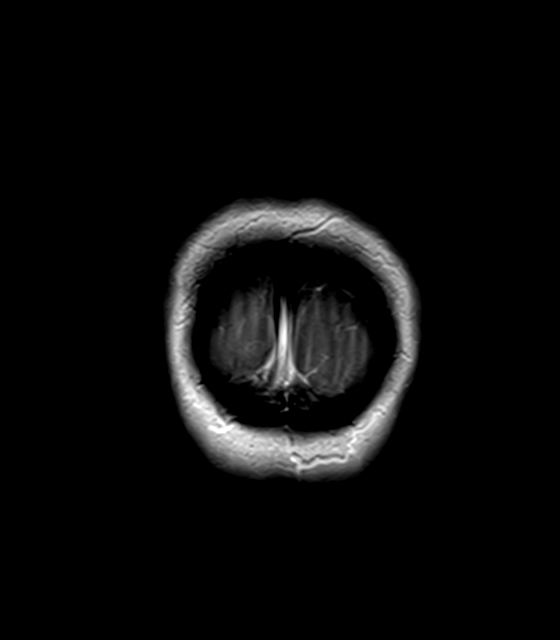

[Series 22: T1 post-contrast · sagittal · 5.0mm · 0.72mm/px · 2 of 25 slices shown (2 of 2)]
[im 1/25]
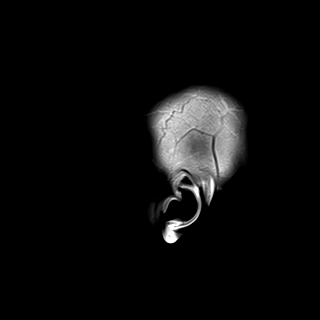
[im 25/25]
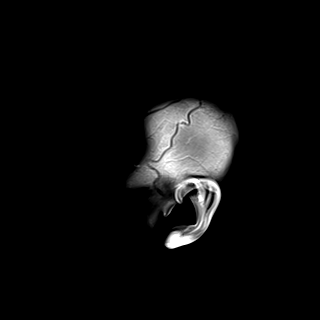

[40 of 48 positions shown; findings below may reference images not displayed]

FINDINGS: Brain: Large confluent area of T2/FLAIR signal abnormality seen
involving the left frontal and temporal lobes, centered at the left
frontal operculum. Involvement of the left insula and anterior left
temporal pole, as well as the anterior left hippocampus. Signal
abnormality involves both the cortical gray matter as well as the
subcortical and deep cerebral white matter. Signal abnormality
extends along the left cortical spinal tract towards the left
cerebral peduncle. No associated restricted diffusion or hemorrhage.
Following contrast administration, patchy somewhat ill-defined
parenchymal enhancement seen along the central and peripheral aspect
of this lesion. Associated regional mass effect with partial
effacement of the left lateral ventricle and 7 mm of left-to-right
shift. No hydrocephalus or ventricular trapping. Basilar cisterns
remain patent. No other focal lesions.

No evidence for acute infarct. No other areas of encephalomalacia to
suggest prior infarction or other insult. No evidence for acute
intracranial hemorrhage. Few small foci of chronic hemosiderin
staining noted associated with a DVA at the subcortical anterior
right frontal lobe (series 20, image 37). No other mass lesion or
abnormal enhancement. No extra-axial fluid collection. Pituitary
gland and suprasellar region within normal limits.

Vascular: Major intracranial vascular flow voids are maintained.

Skull and upper cervical spine: Craniocervical junction within
normal limits. Upper cervical spine normal. Bone marrow signal
intensity within normal limits. No focal marrow replacing lesion. No
scalp soft tissue abnormality.

Sinuses/Orbits: Globes and orbital soft tissues within normal
limits. Paranasal sinuses and mastoid air cells are largely clear.
Inner ear structures grossly normal.

Other: None.
IMPRESSION: 1. Large confluent area of T2/FLAIR signal abnormality involving the
left frontal and temporal lobes with associated patchy post-contrast
enhancement. Finding most concerning for a primary CNS neoplasm.
Possible encephalitis/cerebritis would be the primary differential
consideration, and could be considered in the correct clinical
setting.
2. Associated regional mass effect with 7 mm of left-to-right shift.
No hydrocephalus or ventricular trapping.
3. Incidental small DVA with associated chronic hemosiderin staining
at the anterior right frontal lobe.

## 2019-12-07 IMAGING — MR MR HEAD WO/W CM
14 of 17 series · 27 of 48 positions shown · IV contrast (gadavist)
Comparison: [DATE] and [DATE] head CTs. [DATE] MRI
head.

CLINICAL DATA: Brain mass or lesion.

EXAM:
MRI HEAD WITHOUT AND WITH CONTRAST
TECHNIQUE: Multiplanar, multiecho pulse sequences of the brain and surrounding
structures were obtained without and with intravenous contrast.
CONTRAST:  6mL GADAVIST GADOBUTROL 1 MMOL/ML IV SOLN

[Series 2: FLAIR · sagittal · 3.0mm · 0.47mm/px · 1 of 37 slices shown (1 of 2)]
[im 1/37]
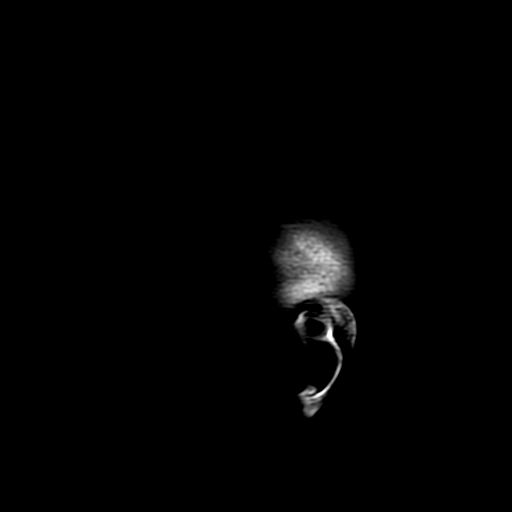

[Series 3: T2 · axial · 5.0mm · 0.43mm/px · 1 of 25 slices shown (1 of 2)]
[im 1/25]
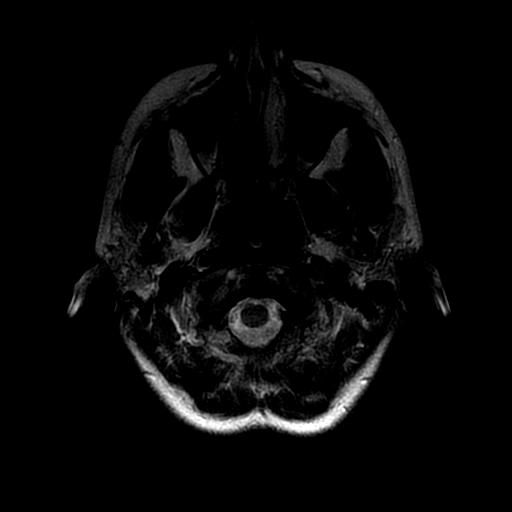

[Series 4: ax dti · axial · 3.0mm · 0.94mm/px · z∈[-67,+68]mm · 8 of 1300 slices shown]
[im 73/1300]
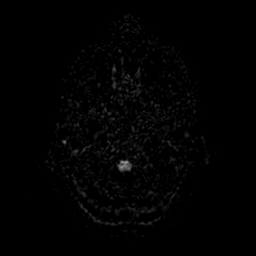
[im 217/1300]
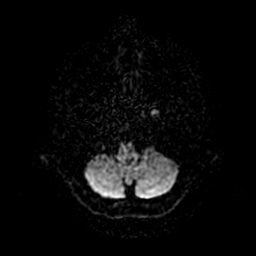
[im 361/1300]
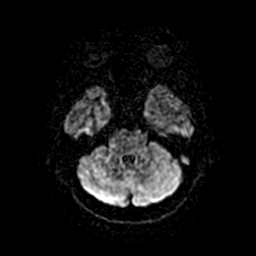
[im 578/1300]
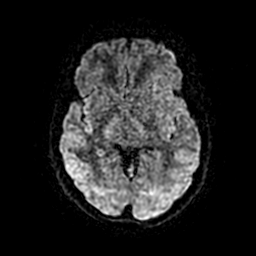
[im 722/1300]
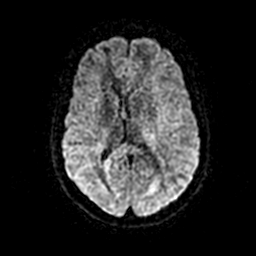
[im 939/1300]
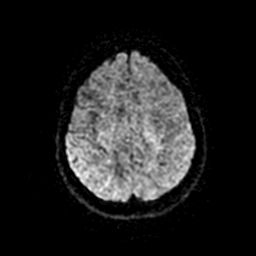
[im 1083/1300]
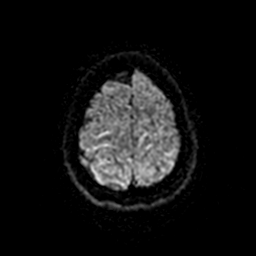
[im 1227/1300]
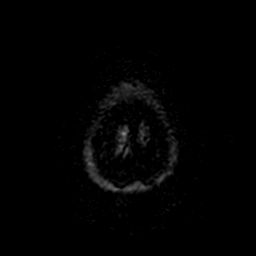

[Series 6: T2 · axial · 5.0mm · 0.23mm/px · 1 of 25 slices shown (2 of 2)]
[im 1/25]
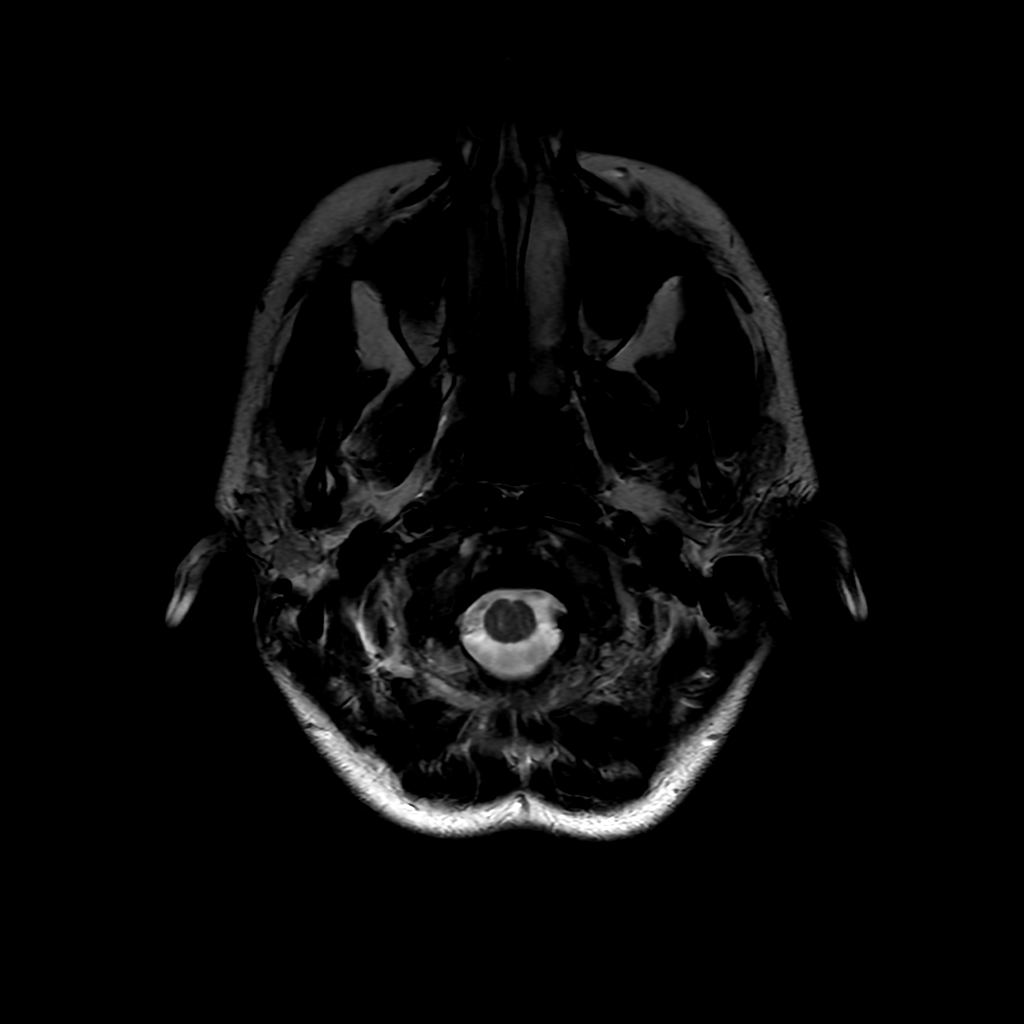

[Series 7: (person_name) · axial · 3.0mm · 0.47mm/px · 1 of 100 slices shown]
[im 1/100]
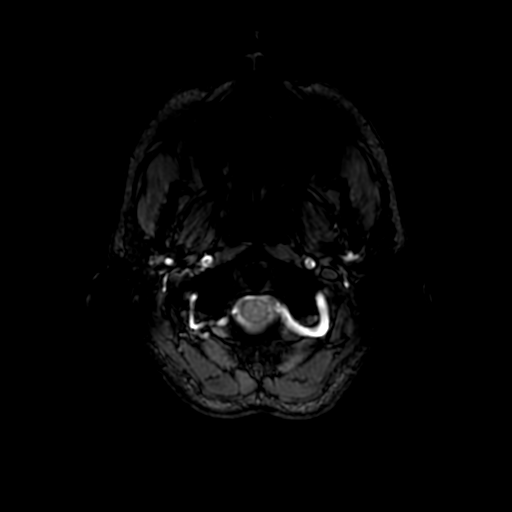

[Series 8: ax 3(person_name) · axial · 1.0mm · 1.02mm/px · z∈[-140,+89]mm · 2 of 230 slices shown (1 of 2)]
[im 1/230]
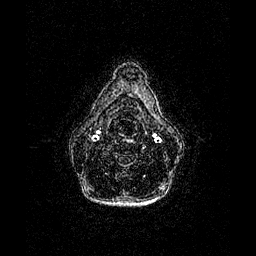
[im 230/230]
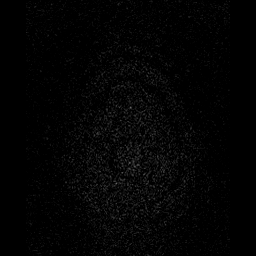

[Series 9: T2 post-contrast · coronal · 3.0mm · 0.39mm/px · 1 of 60 slices shown]
[im 1/60]
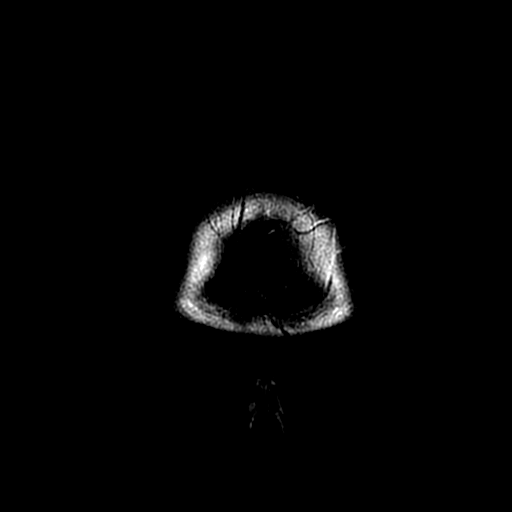

[Series 10: ax 3(person_name) · axial · 1.0mm · 1.02mm/px · z∈[-140,+89]mm · 3 of 230 slices shown (2 of 2)]
[im 1/230]
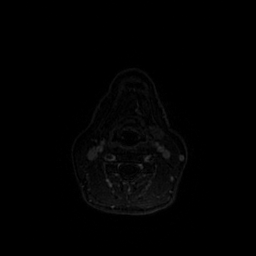
[im 115/230]
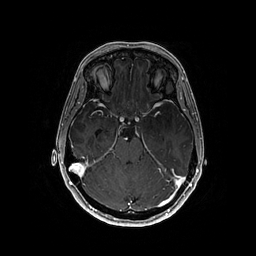
[im 230/230]
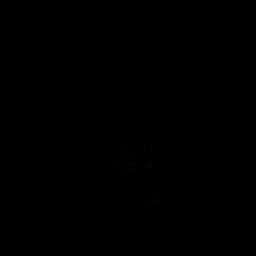

[Series 11: T1 · coronal · 5.0mm · 0.43mm/px · 1 of 30 slices shown]
[im 1/30]
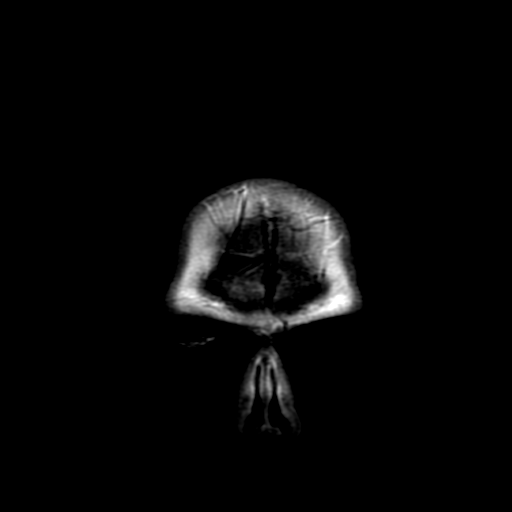

[Series 12: FLAIR · sagittal · 3.0mm · 0.47mm/px · 1 of 37 slices shown (2 of 2)]
[im 1/37]
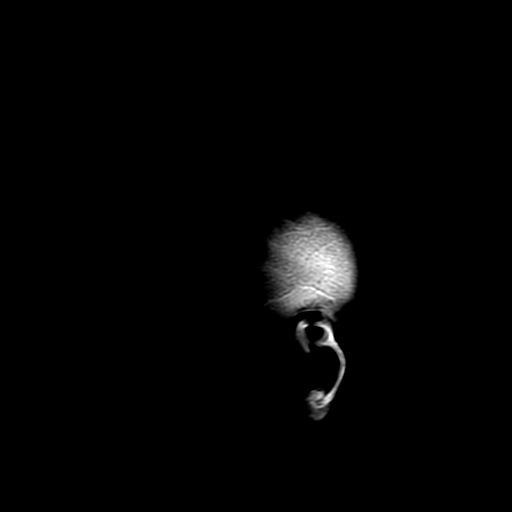

[Series 450: trace · axial · 3.0mm · 0.94mm/px · 1 of 50 slices shown]
[im 1/50]
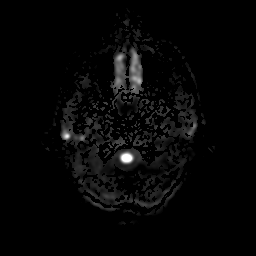

[Series 451: fa(no-q) · axial · 3.0mm · 0.94mm/px · 1 of 47 slices shown]
[im 1/47]
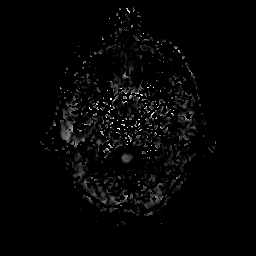

[Series 452: avdc (10^-6 mm²/s)(no-q) · axial · 3.0mm · 0.94mm/px · 1 of 50 slices shown]
[im 1/50]
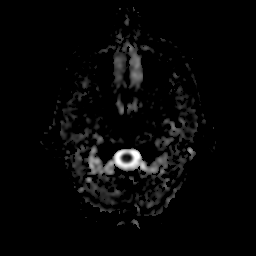

[Series 500: multiplanar reconstruction (mpr) · axial · 1.0mm · 0.50mm/px · z∈[-143,+112]mm · 4 of 256 slices shown]
[im 1/256]
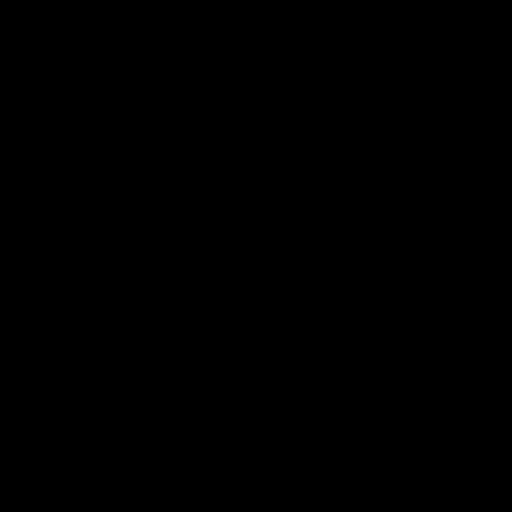
[im 86/256]
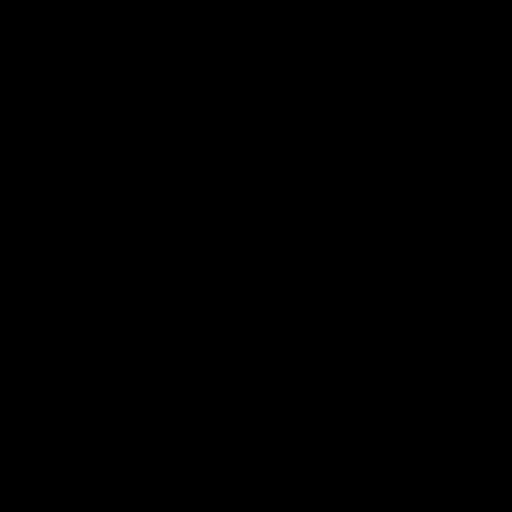
[im 171/256]
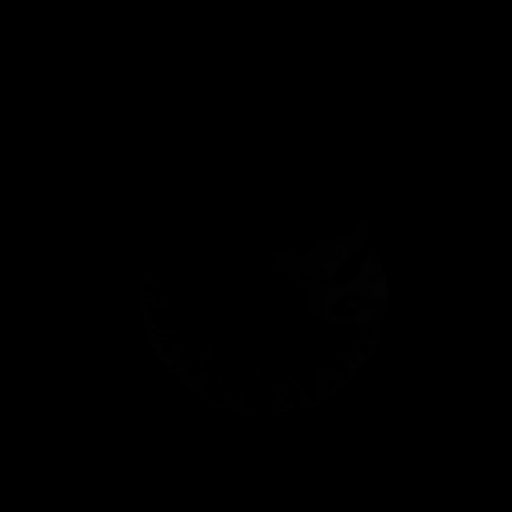
[im 256/256]
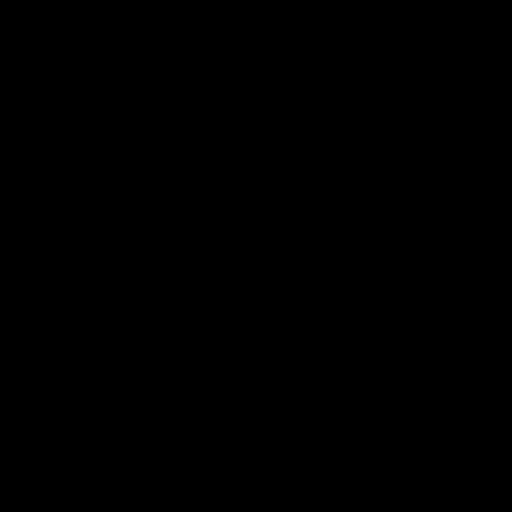

[27 of 48 positions shown; findings below may reference images not displayed]

FINDINGS: Brain: Redemonstration of mass-like confluent T2/FLAIR hyperintense
signal involving the left frontoparietal and temporal regions. There
is unchanged extension of abnormal signal into the insula, thalamus
and basal ganglia. Associated ill-defined patchy enhancement is
unchanged. No restricted diffusion or intracranial hemorrhage.

Rightward midline shift of 6 mm and partial effacement of the left
lateral ventricle, grossly unchanged. No extra-axial fluid
collection.

Vascular: Normal flow voids. Right frontal developmental venous
anomaly.

Skull and upper cervical spine: Normal marrow signal.

Sinuses/Orbits: Normal orbits. Clear paranasal sinuses. No mastoid
effusion.

Other: None.
IMPRESSION: Left frontoparietal and temporal masslike, confluent T2 hyperintense
signal with ill-defined patchy enhancement is unchanged and
concerning for primary neoplasm.

6 mm rightward midline shift with partial left lateral ventricle
effacement, unchanged.

Right frontal developmental venous anomaly.

## 2019-12-07 IMAGING — CT CT CHEST-ABD-PELV W/ CM
2 of 4 series · 13 of 46 positions shown, 15 images · IV contrast (omnipaque)
Comparison: Chest radiograph [DATE]

CLINICAL DATA: Brain mass

EXAM:
CT CHEST, ABDOMEN, AND PELVIS WITH CONTRAST
TECHNIQUE: Multidetector CT imaging of the chest, abdomen and pelvis was
performed following the standard protocol during bolus
administration of intravenous contrast. Oral contrast was
administered for the CT abdomen and pelvis.
CONTRAST:  100mL OMNIPAQUE IOHEXOL 300 MG/ML  SOLN

[Series 3: cap with · axial · 0.65mm/px · z∈[-579,+1]mm · 10 of 138 slices shown, 12 images]
[im 11/138  soft-tissue]
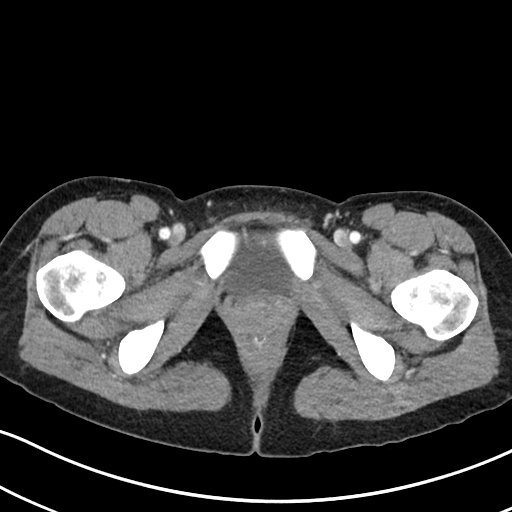
[im 11/138  bone]
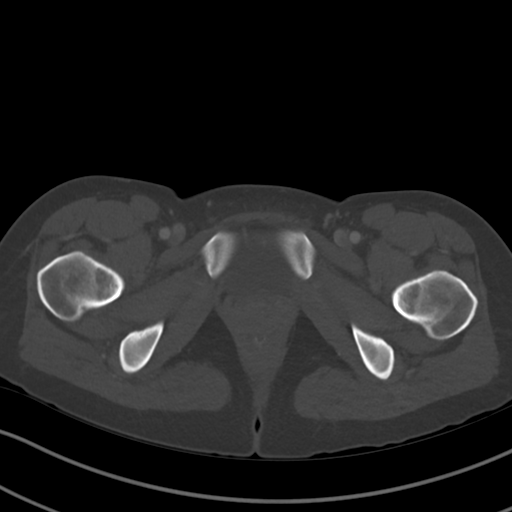
[im 22/138  soft-tissue]
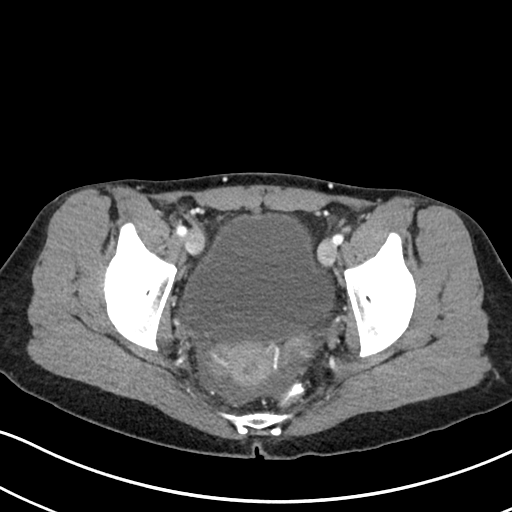
[im 43/138  soft-tissue]
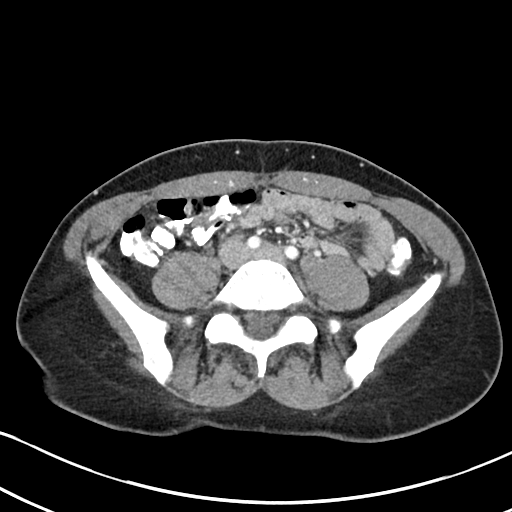
[im 53/138  soft-tissue]
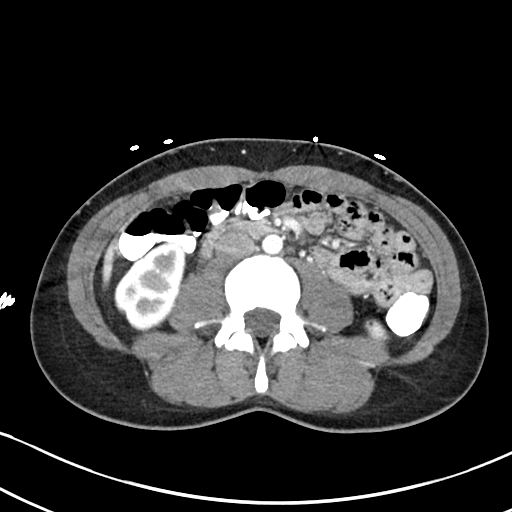
[im 64/138  soft-tissue]
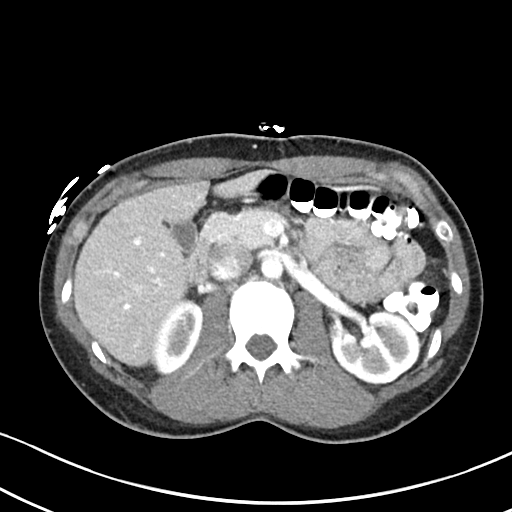
[im 74/138  soft-tissue]
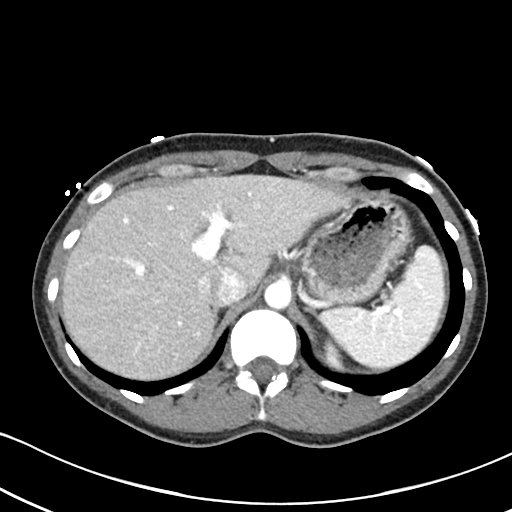
[im 85/138  soft-tissue]
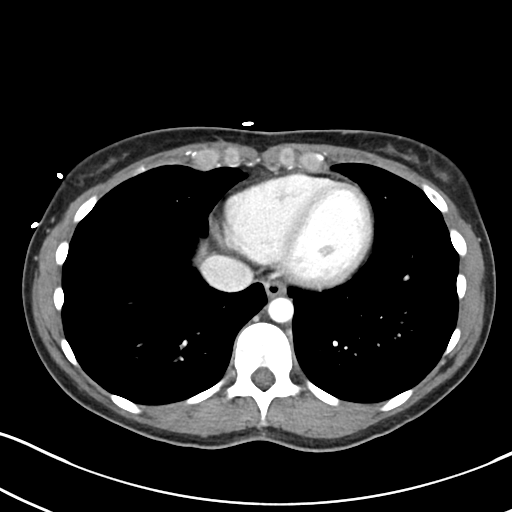
[im 106/138  soft-tissue]
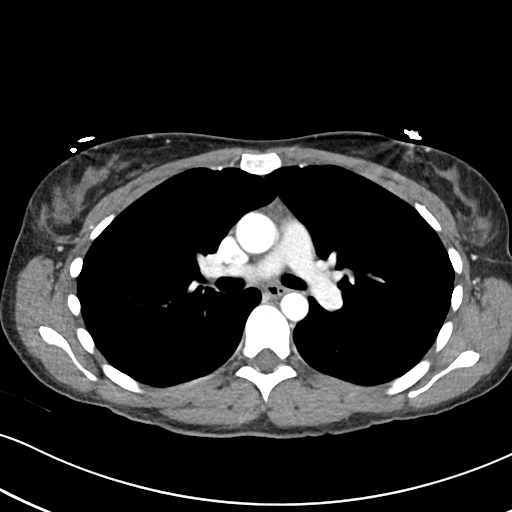
[im 116/138  soft-tissue]
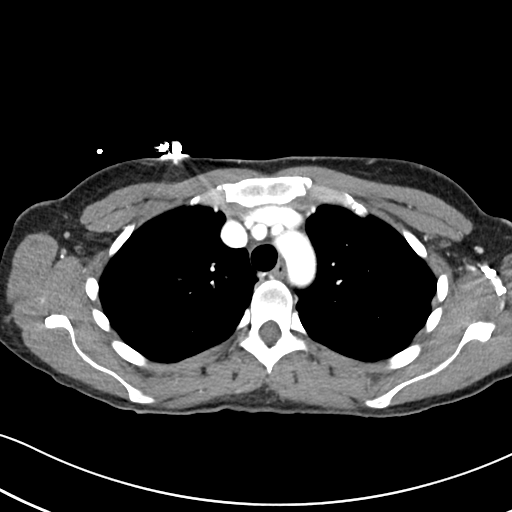
[im 116/138  bone]
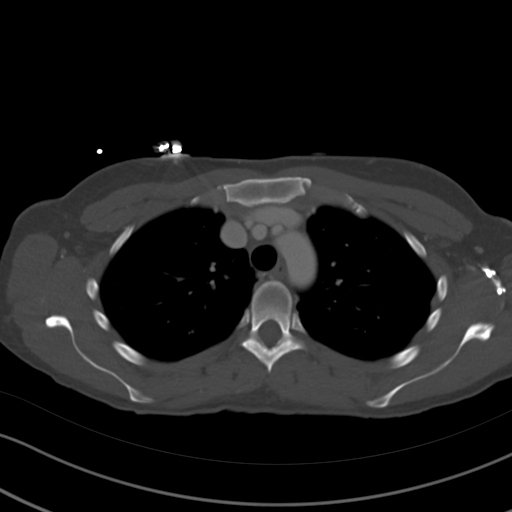
[im 127/138  soft-tissue]
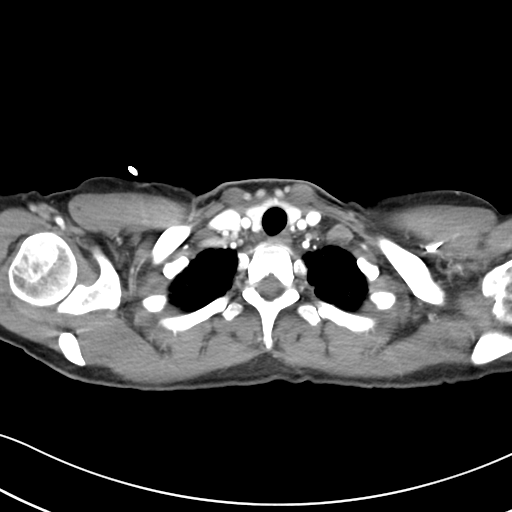

[Series 6: cor · coronal · 0.75mm/px · 3 of 79 slices shown]
[im 27/79  soft-tissue]
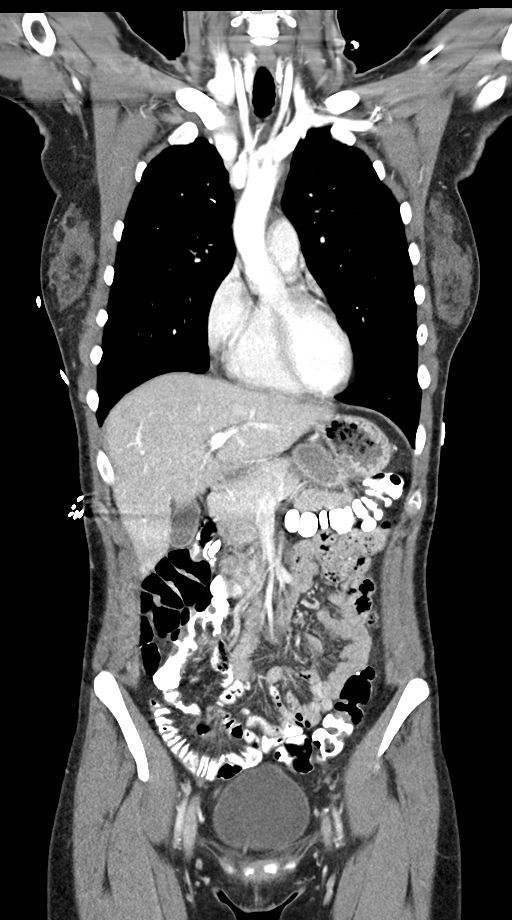
[im 35/79  soft-tissue]
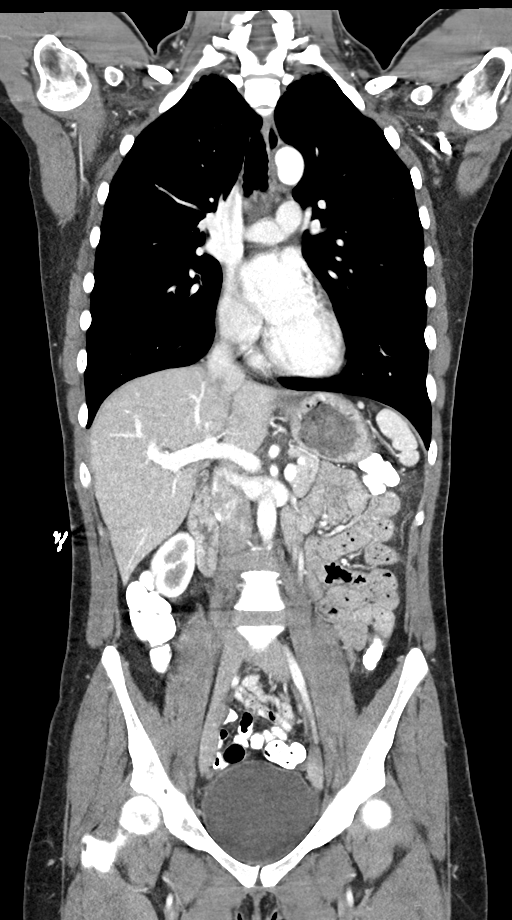
[im 44/79  soft-tissue]
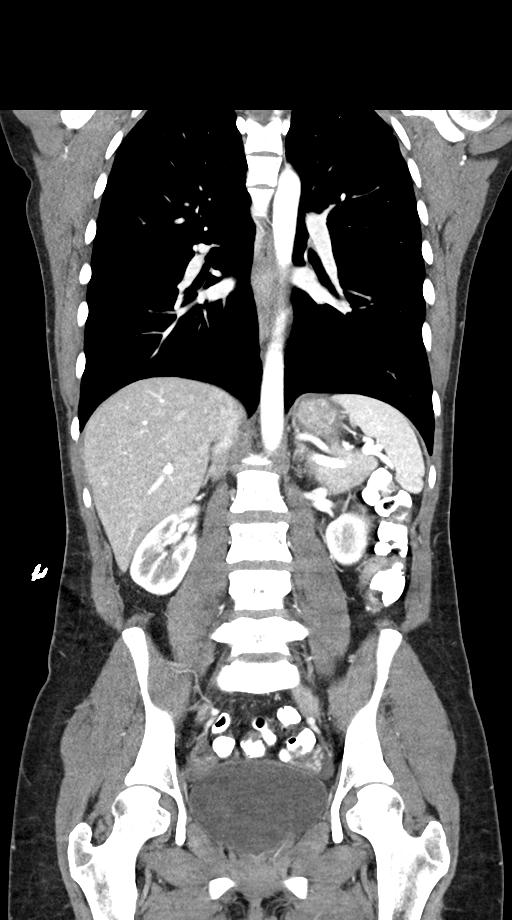

[13 of 46 positions shown; findings below may reference images not displayed]

FINDINGS: CT CHEST FINDINGS

Cardiovascular: There is no evident thoracic aortic aneurysm or
dissection. Visualized great vessels appear normal. No major vessel
pulmonary embolus demonstrable. No pericardial effusion or
pericardial thickening.

Mediastinum/Nodes: Thyroid normal in appearance. There is no
appreciable thoracic adenopathy. No esophageal lesions are
appreciable.

Lungs/Pleura: Slight scarring is noted in the extreme apices
bilaterally. On axial slice 103 series 5, there is a 7 x 4 mm
nodular opacity abutting the pleura in the superior segment of the
right lower lobe. No other nodular appearing opacities evident. No
appreciable edema or airspace opacity. No pleural effusions are
evident.

Musculoskeletal: No blastic or lytic bone lesions. No evident chest
wall lesions.

CT ABDOMEN PELVIS FINDINGS

Hepatobiliary: There is hepatic steatosis. No focal liver lesions
are appreciable. Gallbladder wall is not appreciably thickened.
There is no biliary duct dilatation.

Pancreas: There is no pancreatic mass or inflammatory focus.

Spleen: No splenic lesions are evident.

Adrenals/Urinary Tract: Adrenals bilaterally appear normal. Kidneys
bilaterally show no evident mass or hydronephrosis on either side.
There is no evident renal or ureteral calculus on either side.
Urinary bladder is midline with wall thickness within normal limits.

Stomach/Bowel: There is no appreciable bowel wall or mesenteric
thickening. No evident bowel obstruction. The terminal ileum appears
normal. There is no demonstrable free air or portal venous air.

Vascular/Lymphatic: There is no abdominal aortic aneurysm. No
arterial vascular lesions are evident. Major venous structures
appear patent. There is no appreciable adenopathy in the abdomen or
pelvis.

Reproductive: Uterus is midline. There is evidence suggesting recent
left ovarian cyst rupture with peripheral enhancement of a cystic
mass measuring 2.4 x 2.2 cm. There is fluid tracking from this area
into the cul-de-sac region. A small amount of calcification is noted
in the posterior cul-de-sac region. No other evidence of pelvic mass
by CT.

Other: Appendix appears normal. No abscess in the abdomen or pelvis.
No ascites beyond the fluid in the cul-de-sac.

Musculoskeletal: No blastic or lytic bone lesions. No intramuscular
or abdominal wall lesions evident.
IMPRESSION: Chest CT:

1. 7 x 4 mm nodular opacity abutting the pleura in the superior
segment right lower lobe. Given this finding and the lesion
intracranially, a follow-up chest CT in 3 months to assess for
stability would be advisable. No other nodular appearing opacities
evident.

2.  No evident adenopathy.

CT abdomen and pelvis:

1. Findings suggesting recent ovarian cyst rupture with fluid in the
left adnexa and cul-de-sac regions. Small amount of calcification in
the cul-de-sac region at the mid sacral level likely represents
residua of previous hemorrhage or inflammation. This finding does
not have an appearance suggesting neoplastic focus.
2. Hepatic steatosis.  No focal liver lesions evident.
3. No adenopathy appreciable in the abdomen or pelvis. No findings
suggesting neoplastic focus in the abdomen or pelvis.
4. No bowel obstruction. No abscess in the abdomen pelvis. No
appendiceal region inflammation.

## 2019-12-07 IMAGING — CT CT HEAD W/O CM
3 of 4 series · 14 of 47 positions shown, 16 images · non-contrast
Comparison: Brain MRI [DATE], head CT [DATE].

CLINICAL DATA: Brain mass or lesion, [REDACTED] protocol.

EXAM:
CT HEAD WITHOUT CONTRAST
TECHNIQUE: Contiguous axial images were obtained from the base of the skull
through the vertex without intravenous contrast.

[Series 3: axial thins · axial · 0.40mm/px · z∈[-277,-145]mm · 8 of 160 slices shown, 10 images]
[im 14/160  brain]
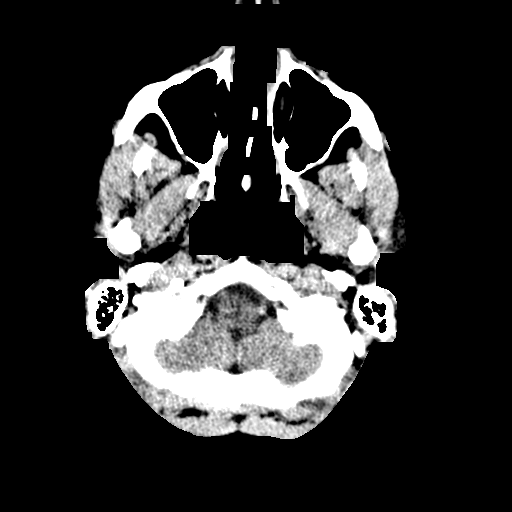
[im 14/160  bone]
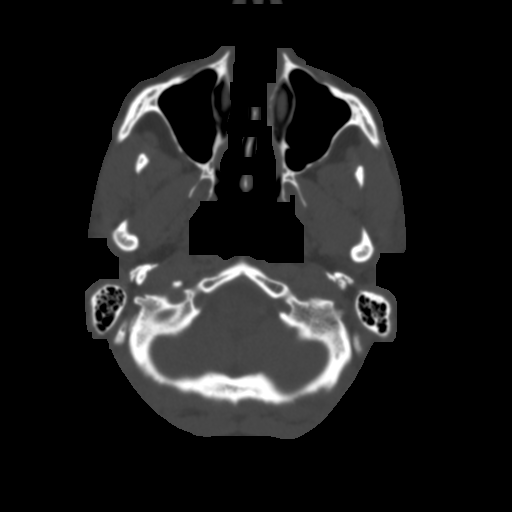
[im 34/160  brain]
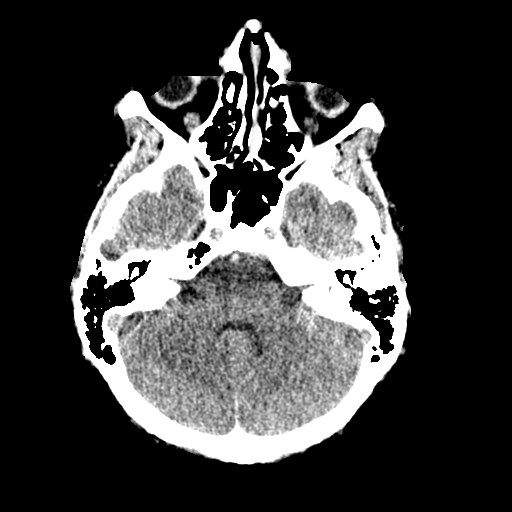
[im 54/160  brain]
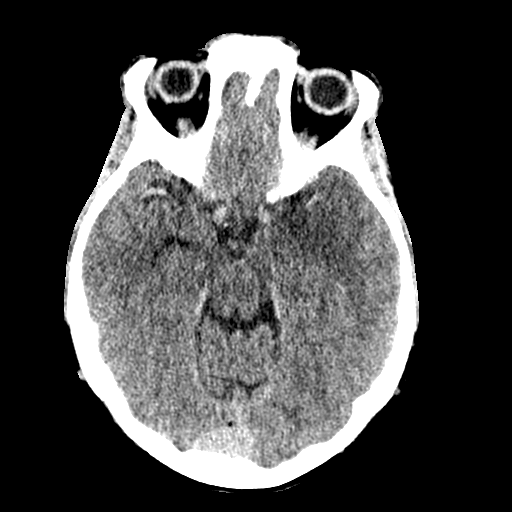
[im 73/160  brain]
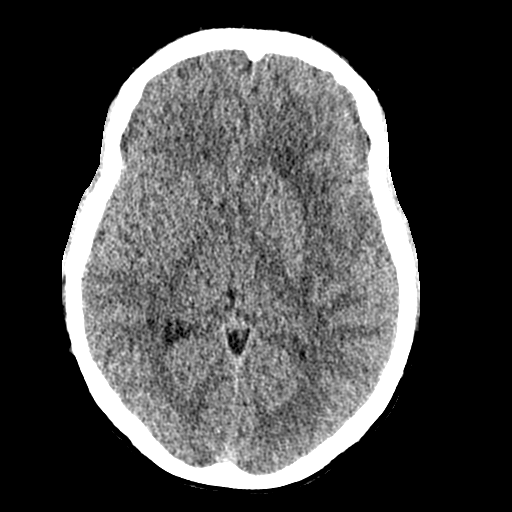
[im 87/160  brain]
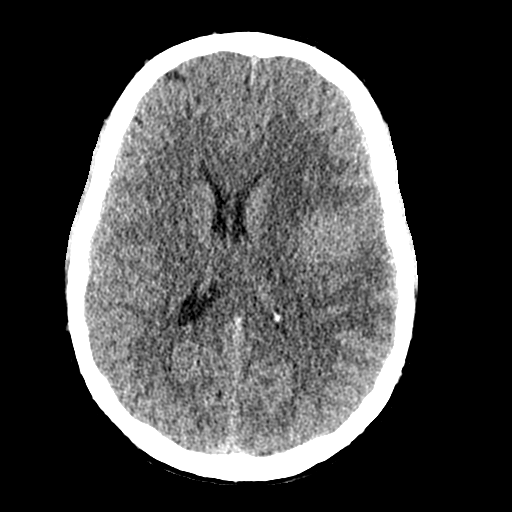
[im 87/160  bone]
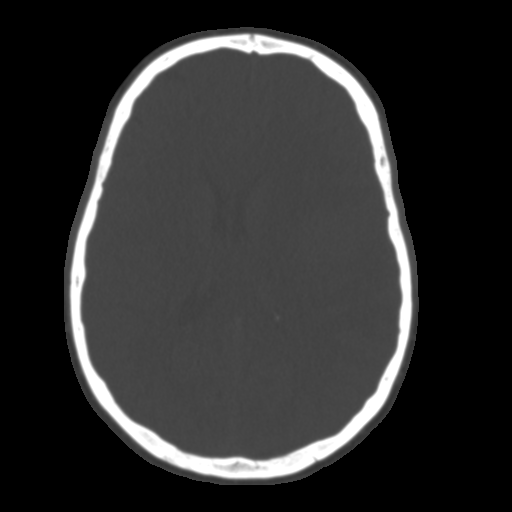
[im 107/160  brain]
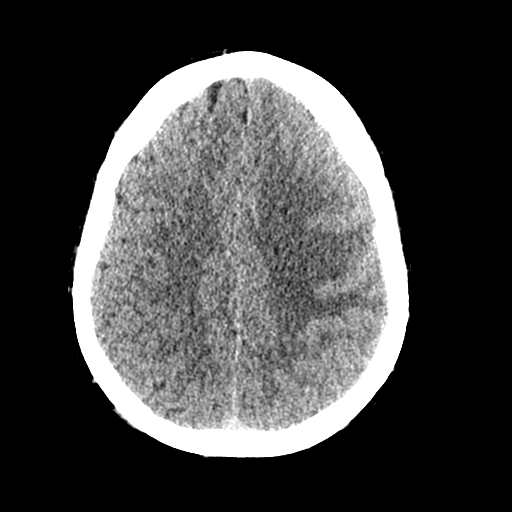
[im 126/160  brain]
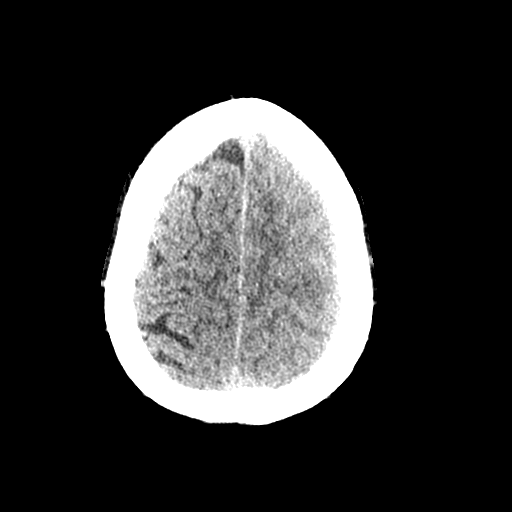
[im 146/160  brain]
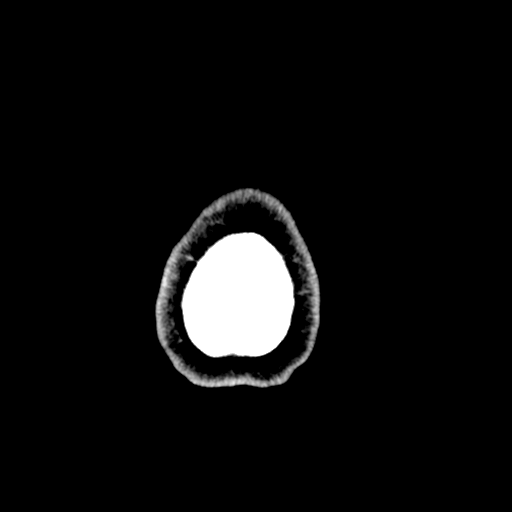

[Series 6: coronal · coronal · 0.30mm/px · 3 of 100 slices shown]
[im 34/100  brain]
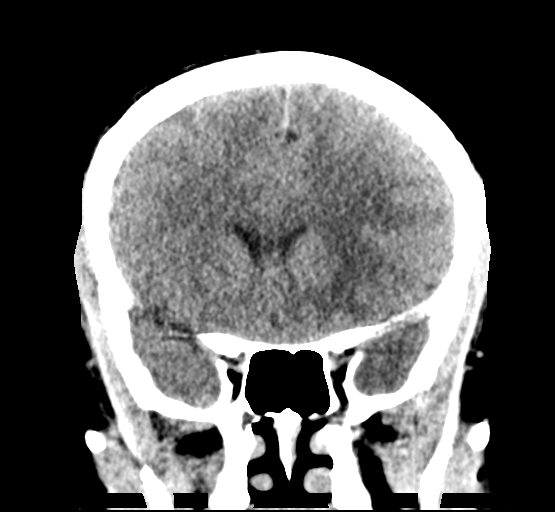
[im 45/100  brain]
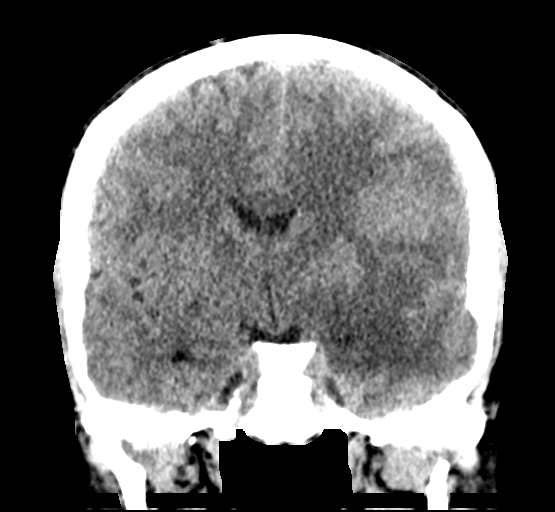
[im 56/100  brain]
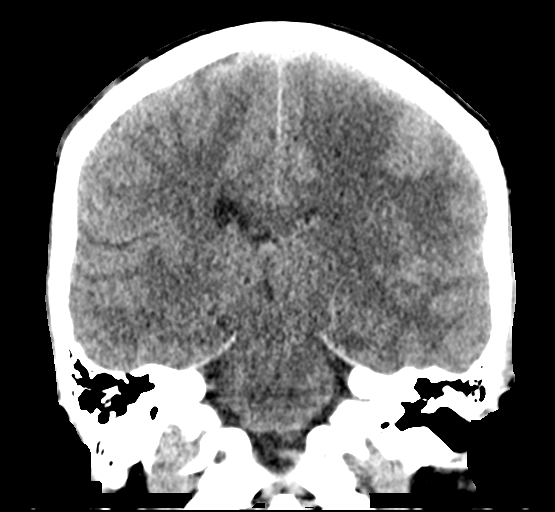

[Series 7: sagittal · sagittal · 0.30mm/px · 3 of 85 slices shown]
[im 29/85  brain]
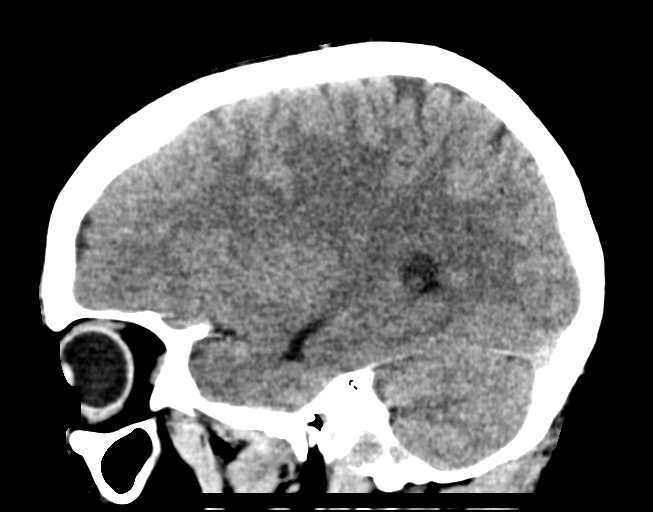
[im 43/85  brain]
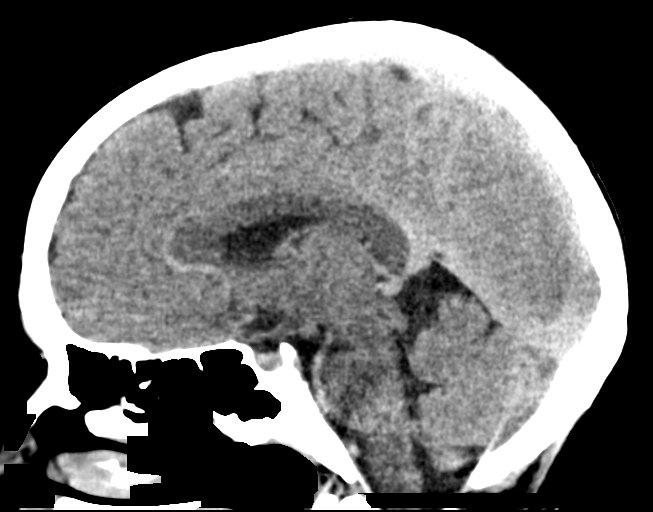
[im 57/85  brain]
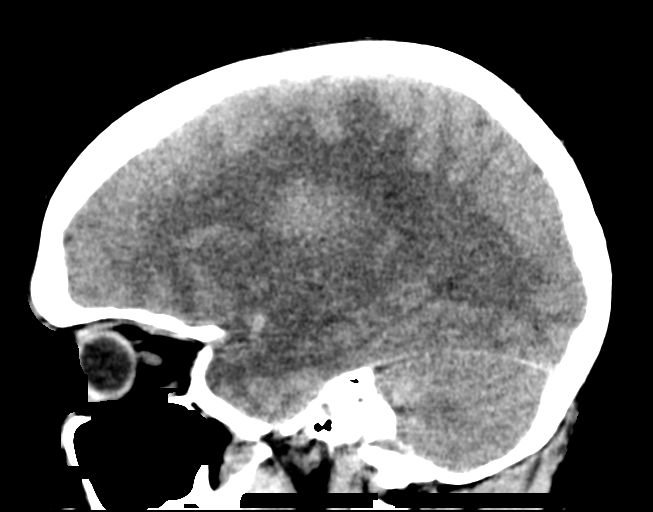

[14 of 47 positions shown; findings below may reference images not displayed]

FINDINGS: Brain:

There is residual circulating contrast material.

Again demonstrated is prominent predominantly vasogenic edema within
the left cerebral hemisphere. This predominantly affects the left
frontal and temporal lobes as well as left internal and external
capsules as well as left insula. However, to a lesser degree,
portions of the left parietal lobe are involved. These findings are
unchanged.

More conspicuous than on the prior head CT of [DATE], there is a
hyperdense ovoid masslike region within the left frontal lobe
centered along the gray-white junction (for instance as seen on
series 4, image 19). This measures 3.4 x 2.7 cm in transaxial
dimensions. Given the residual circulating contrast material, this
is favored to reflect enhancement of an underlying mass. Fatigue
hemorrhage is difficult to definitively exclude.

Unchanged mass effect with partial effacement of the left lateral
ventricle and 7 mm rightward midline shift.

No extra-axial fluid collection.

Vascular: Circulating contrast material limits evaluation for
hyperdense vessels

Skull: Normal. Negative for fracture or focal lesion.

Sinuses/Orbits: Visualized orbits show no acute finding. No
significant paranasal sinus disease or mastoid effusion at the
imaged levels.
IMPRESSION: More conspicuous than on the prior head CT of [DATE], there is a
2.7 x 3.4 cm ovoid mass-like region of hyperdensity within the
mid-to-posterior left frontal lobe centered along the gray-white
junction. Given the presence of residual circulating contrast
material, this is favored to reflect enhancement of an underlying
mass. Petechial hemorrhage at this site is difficult to definitively
exclude.

Unchanged prominent surrounding edema within the left cerebral
hemisphere.

Unchanged mass effect with partial effacement of the left lateral
ventricle and 7 mm rightward midline shift.

## 2019-12-07 MED ORDER — VANCOMYCIN HCL IN DEXTROSE 1-5 GM/200ML-% IV SOLN
1000.0000 mg | INTRAVENOUS | Status: DC
  Filled 2019-12-07: qty 200

## 2019-12-07 MED ORDER — SODIUM CHLORIDE 0.9 % IV BOLUS
1000.0000 mL | Freq: Once | INTRAVENOUS | Status: AC
  Administered 2019-12-07: 1000 mL via INTRAVENOUS

## 2019-12-07 MED ORDER — ONDANSETRON HCL 4 MG/2ML IJ SOLN: 4.0000 mg | Freq: Four times a day (QID) | INTRAMUSCULAR | Status: DC | PRN

## 2019-12-07 MED ORDER — IOHEXOL 9 MG/ML PO SOLN
ORAL | Status: AC
  Administered 2019-12-07: 500 mL
  Filled 2019-12-07: qty 1000

## 2019-12-07 MED ORDER — ONDANSETRON HCL 4 MG PO TABS: 4.0000 mg | ORAL_TABLET | Freq: Four times a day (QID) | ORAL | Status: DC | PRN

## 2019-12-07 MED ORDER — GADOBUTROL 1 MMOL/ML IV SOLN
6.0000 mL | Freq: Once | INTRAVENOUS | Status: AC | PRN
  Administered 2019-12-07: 6 mL via INTRAVENOUS

## 2019-12-07 MED ORDER — ACETAMINOPHEN 500 MG PO TABS
1000.0000 mg | ORAL_TABLET | Freq: Three times a day (TID) | ORAL | Status: DC | PRN
  Administered 2019-12-07: 1000 mg via ORAL
  Filled 2019-12-07: qty 2

## 2019-12-07 MED ORDER — IOHEXOL 300 MG/ML  SOLN
100.0000 mL | Freq: Once | INTRAMUSCULAR | Status: AC | PRN
  Administered 2019-12-07: 100 mL via INTRAVENOUS

## 2019-12-07 MED ORDER — SODIUM CHLORIDE 0.9 % IV SOLN: INTRAVENOUS | Status: DC

## 2019-12-07 NOTE — Progress Notes (Signed)
39 yo F with hx of migraine who presented after numbness of R upper and lower extremity which was followed by R facial twitching.  She had episode of LOC lasting about 30 minutes after which the numbness resolved.  She's had head CT with concern for vasogenic edema and underlying mass lesion with 7 mm L right midline shift.  MRI brain with findings concerning for primary CNS neoplasm.  She's been seen by neurology and neurosurgery.  Neurology recommending 1000 mg keppra BID.  EEG was within normal limits.  Neurosurgery planning for biopsy on 9/9.  CT CAP notable for 7x4 mm nodular opacity in superior segment of right lower lobe (needs follow up imaging in 3 months).  Seen and evaluated at bedside.  See Dr. Moise Boring H&P for today.

## 2019-12-07 NOTE — ED Notes (Signed)
Notified Dr Florene Glen that pt has a slight fever.

## 2019-12-07 NOTE — ED Notes (Signed)
Pt remains at MRI. IVF upon returning.

## 2019-12-07 NOTE — H&P (Signed)
History and Physical    Candace [name] XKG:[id number] DOB: Jun 01, 1980 DOA: 12/06/2019  PCP: Patient, No Pcp Per  Patient coming from: Home.  Chief Complaint: Loss of consciousness.  HPI: Candace [name] is a 39 y.o. female with history of migraine was brought to the ER after patient had an episode of loss of consciousness.  Patient was shopping when patient suddenly started having numbness of the right upper and lower extremity following this patient had right facial twitching which was painful.  Then patient lost consciousness for almost about 30 minutes and following regaining consciousness and numbness resolved.  Patient did not have any palpitation chest pain shortness of breath fever chills.  ED Course: In the ER patient appeared nonfocal CT head was showing left temporal and frontal questioning edema with midline shift 7 mm concerning for underlying mass for which they recommended MRI brain which was done shows features concerning for mass for which neurology was consulted and patient was placed on Keppra for possible focal seizures.  Neurosurgery has been consulted CT chest abdomen pelvis has been ordered to check for any primary.  Review of Systems: As per HPI, rest all negative.   Past Medical History:  Diagnosis Date  . Headache     Past Surgical History:  Procedure Laterality Date  . ABDOMINAL SURGERY    . WISDOM TOOTH EXTRACTION       reports that she has never smoked. She has never used smokeless tobacco. She reports previous alcohol use. She reports that she does not use drugs.  No Known Allergies  Family History  Problem Relation Age of Onset  . Migraines Mother     Prior to Admission medications   Not on File    Physical Exam: Constitutional: Moderately built and nourished. Vitals:   12/07/19 0530 12/07/19 0545 12/07/19 0600 12/07/19 0615  BP: 96/60 99/73 108/75 103/70  Pulse:    86  Resp:    16  Temp:      TempSrc:      SpO2:    99%  Weight:       Height:       Eyes: Anicteric no pallor. ENMT: No discharge from the ears eyes nose or mouth. Neck: No mass felt.  No neck rigidity. Respiratory: No rhonchi or crepitations. Cardiovascular: S1-S2 heard. Abdomen: Soft nontender bowel sounds present. Musculoskeletal: No edema. Skin: No rash. Neurologic: Alert awake oriented to time place and person.  Moves all extremities 5 x 5. Psychiatric: Appears normal.  Normal affect.   Labs on Admission: I have personally reviewed following labs and imaging studies  CBC: Recent Labs  Lab 12/06/19 2043  WBC 13.0*  NEUTROABS 11.8*  HGB 15.1*  HCT 42.6  MCV 86.6  PLT 702   Basic Metabolic Panel: Recent Labs  Lab 12/06/19 2043  NA 137  K 3.6  CL 103  CO2 24  GLUCOSE 103*  BUN 13  CREATININE 0.81  CALCIUM 9.2   GFR: Estimated Creatinine Clearance: 91.3 mL/min (by C-G formula based on SCr of 0.81 mg/dL). Liver Function Tests: Recent Labs  Lab 12/06/19 2043  AST 22  ALT 18  ALKPHOS 44  BILITOT 0.7  PROT 8.2*  ALBUMIN 5.1*   No results for input(s): LIPASE, AMYLASE in the last 168 hours. No results for input(s): AMMONIA in the last 168 hours. Coagulation Profile: No results for input(s): INR, PROTIME in the last 168 hours. Cardiac Enzymes: No results for input(s): CKTOTAL, CKMB, CKMBINDEX, TROPONINI in the last 168 hours. BNP (  last 3 results) No results for input(s): PROBNP in the last 8760 hours. HbA1C: No results for input(s): HGBA1C in the last 72 hours. CBG: No results for input(s): GLUCAP in the last 168 hours. Lipid Profile: No results for input(s): CHOL, HDL, LDLCALC, TRIG, CHOLHDL, LDLDIRECT in the last 72 hours. Thyroid Function Tests: No results for input(s): TSH, T4TOTAL, FREET4, T3FREE, THYROIDAB in the last 72 hours. Anemia Panel: No results for input(s): VITAMINB12, FOLATE, FERRITIN, TIBC, IRON, RETICCTPCT in the last 72 hours. Urine analysis: No results found for: COLORURINE, APPEARANCEUR, LABSPEC,  PHURINE, GLUCOSEU, HGBUR, BILIRUBINUR, KETONESUR, PROTEINUR, UROBILINOGEN, NITRITE, LEUKOCYTESUR Sepsis Labs: @LABRCNTIP (procalcitonin:4,lacticidven:4) ) Recent Results (from the past 240 hour(s))  SARS Coronavirus 2 by RT PCR (hospital order, performed in [facility] hospital lab) Nasopharyngeal Nasopharyngeal Swab     Status: None   Collection Time: 12/06/19  8:43 PM   Specimen: Nasopharyngeal Swab  Result Value Ref Range Status   SARS Coronavirus 2 NEGATIVE NEGATIVE Final    Comment: (NOTE) SARS-CoV-2 target nucleic acids are NOT DETECTED.  The SARS-CoV-2 RNA is generally detectable in upper and lower respiratory specimens during the acute phase of infection. The lowest concentration of SARS-CoV-2 viral copies this assay can detect is 250 copies / mL. A negative result does not preclude SARS-CoV-2 infection and should not be used as the sole basis for treatment or other patient management decisions.  A negative result may occur with improper specimen collection / handling, submission of specimen other than nasopharyngeal swab, presence of viral mutation(s) within the areas targeted by this assay, and inadequate number of viral copies (<250 copies / mL). A negative result must be combined with clinical observations, patient history, and epidemiological information.  Fact Sheet for Patients:   StrictlyIdeas.no  Fact Sheet for Healthcare Providers: BankingDealers.co.za  This test is not yet approved or  cleared by the [place] FDA and has been authorized for detection and/or diagnosis of SARS-CoV-2 by FDA under an Emergency Use Authorization (EUA).  This EUA will remain in effect (meaning this test can be used) for the duration of the COVID-19 declaration under Section 564(b)(1) of the Act, 21 U.S.C. section 360bbb-3(b)(1), unless the authorization is terminated or revoked sooner.  Performed at [facility], [address] [place], [place] [address]      Radiological Exams on Admission: CT Head Wo Contrast  Result Date: 12/06/2019 CLINICAL DATA:  Syncope, slurred speech EXAM: CT HEAD WITHOUT CONTRAST TECHNIQUE: Contiguous axial images were obtained from the base of the skull through the vertex without intravenous contrast. COMPARISON:  None. FINDINGS: Brain: There is low-density/edema throughout the left frontal lobe and temporal lobe. Appearance is concerning for vasogenic edema possibly related to underlying mass lesion. Mass effect with 7 mm of left-to-right midline shift. No hydrocephalus. Vascular: No hyperdense vessel or unexpected calcification. Skull: No acute calvarial abnormality. Sinuses/Orbits: Visualized paranasal sinuses and mastoids clear. Orbital soft tissues unremarkable. Other: None IMPRESSION: Edema throughout the left frontal and temporal lobes concerning for vasogenic edema and possible underlying mass lesion. Recommend further evaluation with MRI with and without contrast. 7 mm of left right midline shift. Critical Value/emergent results were called by telephone at the time of interpretation on 12/06/2019 at 7:43 pm to provider Fredia Sorrow , who verbally acknowledged these results. Electronically Signed   By: Rolm Baptise M.D.   On: 12/06/2019 19:46   MR Brain W and Wo Contrast  Result Date: 12/07/2019 CLINICAL DATA:  Initial evaluation for acute seizure, possible  mass lesion. EXAM: MRI HEAD WITHOUT AND WITH CONTRAST TECHNIQUE: Multiplanar, multiecho pulse sequences of the brain and surrounding structures were obtained without and with intravenous contrast. CONTRAST:  43mL GADAVIST GADOBUTROL 1 MMOL/ML IV SOLN COMPARISON:  Comparison made with prior CT from 12/06/2019. FINDINGS: Brain: Large confluent area of T2/FLAIR signal abnormality seen involving the left frontal and temporal lobes, centered at the left frontal operculum. Involvement of the left insula and anterior left temporal pole,  as well as the anterior left hippocampus. Signal abnormality involves both the cortical gray matter as well as the subcortical and deep cerebral white matter. Signal abnormality extends along the left cortical spinal tract towards the left cerebral peduncle. No associated restricted diffusion or hemorrhage. Following contrast administration, patchy somewhat ill-defined parenchymal enhancement seen along the central and peripheral aspect of this lesion. Associated regional mass effect with partial effacement of the left lateral ventricle and 7 mm of left-to-right shift. No hydrocephalus or ventricular trapping. Basilar cisterns remain patent. No other focal lesions. No evidence for acute infarct. No other areas of encephalomalacia to suggest prior infarction or other insult. No evidence for acute intracranial hemorrhage. Few small foci of chronic hemosiderin staining noted associated with a DVA at the subcortical anterior right frontal lobe (series 20, image 37). No other mass lesion or abnormal enhancement. No extra-axial fluid collection. Pituitary gland and suprasellar region within normal limits. Vascular: Major intracranial vascular flow voids are maintained. Skull and upper cervical spine: Craniocervical junction within normal limits. Upper cervical spine normal. Bone marrow signal intensity within normal limits. No focal marrow replacing lesion. No scalp soft tissue abnormality. Sinuses/Orbits: Globes and orbital soft tissues within normal limits. Paranasal sinuses and mastoid air cells are largely clear. Inner ear structures grossly normal. Other: None. IMPRESSION: 1. Large confluent area of T2/FLAIR signal abnormality involving the left frontal and temporal lobes with associated patchy post-contrast enhancement. Finding most concerning for a primary CNS neoplasm. Possible encephalitis/cerebritis would be the primary differential consideration, and could be considered in the correct clinical setting. 2.  Associated regional mass effect with 7 mm of left-to-right shift. No hydrocephalus or ventricular trapping. 3. Incidental small DVA with associated chronic hemosiderin staining at the anterior right frontal lobe. Electronically Signed   By: Jeannine Boga M.D.   On: 12/07/2019 03:43   DG Chest Port 1 View  Result Date: 12/06/2019 CLINICAL DATA:  Migraine headache, syncope EXAM: PORTABLE CHEST 1 VIEW COMPARISON:  None. FINDINGS: The heart size and mediastinal contours are within normal limits. Both lungs are clear. The visualized skeletal structures are unremarkable. IMPRESSION: No active disease. Electronically Signed   By: Randa Ngo M.D.   On: 12/06/2019 21:00    EKG: Independently reviewed.  EKG shows normal sinus rhythm with nonspecific inferolateral T wave changes.  Assessment/Plan Principal Problem:   Brain mass Active Problems:   Focal seizure ([place])    1. Possible brain mass with possible focal seizures -appreciate neurology consult and recommendations patient is on Keppra we will be checking EEG.  CT chest abdomen pelvis has been ordered to check for any primary.  Neurosurgery will be following patient for possible biopsy. 2. History of migraine. 3. Leukocytosis likely reactionary.  Since patient has possible mass in the brain with seizures will need close monitoring for any further worsening in inpatient status.   DVT prophylaxis: SCDs.  Avoiding anticoagulation anticipation of possible procedure. Code Status: Full code. Family Communication: Patient's husband. Disposition Plan: Home. Consults called: Neurology and neurosurgery. Admission status: Inpatient.  Rise Patience MD [facility] Pager [phone].  If 7PM-7AM, please contact night-coverage [email or web address] Password [facility]  12/07/2019, 6:26 AM

## 2019-12-07 NOTE — Consult Note (Signed)
Chief Complaint   Chief Complaint  Patient presents with  . Loss of Consciousness    HPI   Consult requested by: Dr Stark Jock, EDP [facility] Reason for consult: abnormal brain MRI, left frontotemporoparietal infiltrative lesion  HPI: Candace [name] is a 39 y.o. female with history of migraines who presented to [facility] yesterday after acute onset right upper and lower extremity numbness and tingling, facial spasm and ultimately syncopal episode.  She underwent head CT and ultimately MRI which revealed a large left frontotemporoparietal infiltrative lesion. A neurosurgical consultation was requested.  Her initial presentation was thought to represent seizures and she was therefore started on Keppra 1000 mg b.i.d..   She is currently asymptomatic at this point.  Patient Active Problem List   Diagnosis Date Noted  . Focal seizure ([place]) 12/07/2019  . Brain mass 12/07/2019    PMH: Past Medical History:  Diagnosis Date  . Headache     PSH: Past Surgical History:  Procedure Laterality Date  . ABDOMINAL SURGERY    . WISDOM TOOTH EXTRACTION      (Not in a hospital admission)   SH: Social History   Tobacco Use  . Smoking status: Never Smoker  . Smokeless tobacco: Never Used  Substance Use Topics  . Alcohol use: Not Currently  . Drug use: Never    MEDS: Prior to Admission medications   Medication Sig Start Date End Date Taking? Authorizing Provider  MAGNESIUM PO Take 1 tablet by mouth daily.   Yes [provider]  Nettle, Urtica Dioica, (NETTLE LEAF PO) Take 1 tablet by mouth daily.   Yes [provider]  TURMERIC PO Take 1 tablet by mouth daily.   Yes [provider]  UNABLE TO FIND Take 1 Dose by mouth daily. Med Name: Mushroom Tincture; for immune support   Yes [provider]    ALLERGY: Allergies  Allergen Reactions  . Penicillins Rash and Other (See Comments)    Pt does not recall so well    Social History    Tobacco Use  . Smoking status: Never Smoker  . Smokeless tobacco: Never Used  Substance Use Topics  . Alcohol use: Not Currently     Family History  Problem Relation Age of Onset  . Migraines Mother      ROS   Review of Systems  All other systems reviewed and are negative.   Exam   Vitals:   12/07/19 0600 12/07/19 0615  BP: 108/75 103/70  Pulse:  86  Resp:  16  Temp:    SpO2:  99%   General appearance: WDWN, NAD Eyes: No scleral injection Cardiovascular: Regular rate and rhythm without murmurs, rubs, gallops. No edema or variciosities. Distal pulses normal. Pulmonary: Effort normal, non-labored breathing Musculoskeletal:     Muscle tone upper extremities: Normal    Muscle tone lower extremities: Normal    Motor exam: Upper Extremities Deltoid Bicep Tricep Grip  Right 5/5 5/5 5/5 5/5  Left 5/5 5/5 5/5 5/5   Lower Extremity IP Quad PF DF EHL  Right 5/5 5/5 5/5 5/5 5/5  Left 5/5 5/5 5/5 5/5 5/5   Neurological Mental Status:    - Patient is awake, alert, oriented to person, place, month, year, and situation    - Patient is able to give a clear and coherent history.    - No signs of aphasia or neglect Cranial Nerves    - II: Visual Fields are full. PERRL    -  III/IV/VI: EOMI without ptosis or diploplia.     - V: Facial sensation is grossly normal    - VII: Facial movement is symmetric.     - VIII: hearing is intact to voice    - X: Uvula elevates symmetrically    - XI: Shoulder shrug is symmetric.    - XII: tongue is midline without atrophy or fasciculations.  Sensory: Sensation grossly intact to LT  Results - Imaging/Labs   Results for orders placed or performed during the hospital encounter of 12/06/19 (from the past 48 hour(s))  Pregnancy, urine     Status: None   Collection Time: 12/06/19  6:55 PM  Result Value Ref Range   Preg Test, Ur NEGATIVE NEGATIVE    Comment:        THE SENSITIVITY OF THIS METHODOLOGY IS >20 mIU/mL. Performed at [facility], [address] [place], [place] [address]   Comprehensive metabolic panel     Status: Abnormal   Collection Time: 12/06/19  8:43 PM  Result Value Ref Range   Sodium 137 135 - 145 mmol/L   Potassium 3.6 3.5 - 5.1 mmol/L   Chloride 103 98 - 111 mmol/L   CO2 24 22 - 32 mmol/L   Glucose, Bld 103 (H) 70 - 99 mg/dL    Comment: Glucose reference range applies only to samples taken after fasting for at least 8 hours.   BUN 13 6 - 20 mg/dL   Creatinine, Ser 0.81 0.44 - 1.00 mg/dL   Calcium 9.2 8.9 - 10.3 mg/dL   Total Protein 8.2 (H) 6.5 - 8.1 g/dL   Albumin 5.1 (H) 3.5 - 5.0 g/dL   AST 22 15 - 41 U/L   ALT 18 0 - 44 U/L   Alkaline Phosphatase 44 38 - 126 U/L   Total Bilirubin 0.7 0.3 - 1.2 mg/dL   GFR calc non Af Amer >60 >60 mL/min   GFR calc Af Amer >60 >60 mL/min   Anion gap 10 5 - 15    Comment: Performed at [facility], [address] [place], [place] [address]  CBC with Differential/Platelet     Status: Abnormal   Collection Time: 12/06/19  8:43 PM  Result Value Ref Range   WBC 13.0 (H) 4.0 - 10.5 K/uL   RBC 4.92 3.87 - 5.11 MIL/uL   Hemoglobin 15.1 (H) 12.0 - 15.0 g/dL   HCT 42.6 36 - 46 %   MCV 86.6 80.0 - 100.0 fL   MCH 30.7 26.0 - 34.0 pg   MCHC 35.4 30.0 - 36.0 g/dL   RDW 11.9 11.5 - 15.5 %   Platelets 295 150 - 400 K/uL   nRBC 0.0 0.0 - 0.2 %   Neutrophils Relative % 91 %   Neutro Abs 11.8 (H) 1.7 - 7.7 K/uL   Lymphocytes Relative 6 %   Lymphs Abs 0.8 0.7 - 4.0 K/uL   Monocytes Relative 2 %   Monocytes Absolute 0.3 0 - 1 K/uL   Eosinophils Relative 0 %   Eosinophils Absolute 0.0 0 - 0 K/uL   Basophils Relative 0 %   Basophils Absolute 0.0 0 - 0 K/uL   Immature Granulocytes 1 %   Abs Immature Granulocytes 0.07 0.00 - 0.07 K/uL    Comment: Performed at [facility], [address] [place], [place] [address]  SARS Coronavirus 2 by RT PCR (hospital order, performed in [facility] hospital lab) Nasopharyngeal  Nasopharyngeal Swab     Status: None   Collection Time: 12/06/19  8:43 PM   Specimen: Nasopharyngeal Swab  Result Value Ref Range   SARS Coronavirus 2 NEGATIVE NEGATIVE    Comment: (NOTE) SARS-CoV-2 target nucleic acids are NOT DETECTED.  The SARS-CoV-2 RNA is generally detectable in upper and lower respiratory specimens during the acute phase of infection. The lowest concentration of SARS-CoV-2 viral copies this assay can detect is 250 copies / mL. A negative result does not preclude SARS-CoV-2 infection and should not be used as the sole basis for treatment or other patient management decisions.  A negative result may occur with improper specimen collection / handling, submission of specimen other than nasopharyngeal swab, presence of viral mutation(s) within the areas targeted by this assay, and inadequate number of viral copies (<250 copies / mL). A negative result must be combined with clinical observations, patient history, and epidemiological information.  Fact Sheet for Patients:   StrictlyIdeas.no  Fact Sheet for Healthcare Providers: BankingDealers.co.za  This test is not yet approved or  cleared by the [place] FDA and has been authorized for detection and/or diagnosis of SARS-CoV-2 by FDA under an Emergency Use Authorization (EUA).  This EUA will remain in effect (meaning this test can be used) for the duration of the COVID-19 declaration under Section 564(b)(1) of the Act, 21 U.S.C. section 360bbb-3(b)(1), unless the authorization is terminated or revoked sooner.  Performed at [facility], [address] [place], [place] [address]   CBC with Differential/Platelet     Status: None   Collection Time: 12/07/19  6:16 AM  Result Value Ref Range   WBC 7.1 4.0 - 10.5 K/uL   RBC 4.21 3.87 - 5.11 MIL/uL   Hemoglobin 12.5 12.0 - 15.0 g/dL   HCT 37.2 36 - 46 %   MCV 88.4 80.0 - 100.0 fL   MCH 29.7 26.0 -  34.0 pg   MCHC 33.6 30.0 - 36.0 g/dL   RDW 11.8 11.5 - 15.5 %   Platelets 260 150 - 400 K/uL   nRBC 0.0 0.0 - 0.2 %   Neutrophils Relative % 70 %   Neutro Abs 4.9 1.7 - 7.7 K/uL   Lymphocytes Relative 22 %   Lymphs Abs 1.6 0.7 - 4.0 K/uL   Monocytes Relative 8 %   Monocytes Absolute 0.6 0 - 1 K/uL   Eosinophils Relative 0 %   Eosinophils Absolute 0.0 0 - 0 K/uL   Basophils Relative 0 %   Basophils Absolute 0.0 0 - 0 K/uL   Immature Granulocytes 0 %   Abs Immature Granulocytes 0.02 0.00 - 0.07 K/uL    Comment: Performed at [facility], [address], [place], [place] [address]  Comprehensive metabolic panel     Status: Abnormal   Collection Time: 12/07/19  6:16 AM  Result Value Ref Range   Sodium 138 135 - 145 mmol/L   Potassium 3.9 3.5 - 5.1 mmol/L   Chloride 109 98 - 111 mmol/L   CO2 20 (L) 22 - 32 mmol/L   Glucose, Bld 90 70 - 99 mg/dL    Comment: Glucose reference range applies only to samples taken after fasting for at least 8 hours.   BUN 9 6 - 20 mg/dL   Creatinine, Ser 0.91 0.44 - 1.00 mg/dL   Calcium 8.3 (L) 8.9 - 10.3 mg/dL   Total Protein 6.0 (L) 6.5 - 8.1 g/dL   Albumin 3.6 3.5 - 5.0  g/dL   AST 24 15 - 41 U/L   ALT 15 0 - 44 U/L   Alkaline Phosphatase 33 (L) 38 - 126 U/L   Total Bilirubin 0.9 0.3 - 1.2 mg/dL   GFR calc non Af Amer >60 >60 mL/min   GFR calc Af Amer >60 >60 mL/min   Anion gap 9 5 - 15    Comment: Performed at [facility] [address] [place], [place] [address]  Protime-INR     Status: None   Collection Time: 12/07/19  6:16 AM  Result Value Ref Range   Prothrombin Time 14.3 11.4 - 15.2 seconds   INR 1.2 0.8 - 1.2    Comment: (NOTE) INR goal varies based on device and disease states. Performed at [facility], [place] [address] [place], [place] [address]   APTT     Status: None   Collection Time: 12/07/19  6:16 AM  Result Value Ref Range   aPTT 26 24 - 36 seconds    Comment: Performed at [facility] [address], [place], [place] [address]    EEG  Result Date: 12/07/2019 Lora Havens, MD     12/07/2019  9:13 AM Patient Name: Anavey Coombes MRN: [id number] Epilepsy Attending: Lora Havens Referring Physician/Provider: Dr. Gean Birchwood Date: 12/07/1019 Duration: 25.42 mins Patient history: 39 year old female who presented with a syncopal event with facial spasm.  EEG to evaluate for seizures. Level of alertness: Awake, asleep AEDs during EEG study: Keppra Technical aspects: This EEG study was done with scalp electrodes positioned according to the 10-20 International system of electrode placement. Electrical activity was acquired at a sampling rate of 500Hz  and reviewed with a high frequency filter of 70Hz  and a low frequency filter of 1Hz . EEG data were recorded continuously and digitally stored. Description: The posterior dominant rhythm consists of 10 Hz activity of moderate voltage (25-35 uV) seen predominantly in posterior head regions, symmetric and reactive to eye opening and eye closing. Sleep was characterized by vertex waves, sleep spindles (12 to 14 Hz), maximal frontocentral region, POSTs. Physiologic photic driving was seen during photic stimulation.  Hyperventilation was not performed.   IMPRESSION: This study is within normal limits. No seizures or epileptiform discharges were seen throughout the recording. Lora Havens   CT Head Wo Contrast  Result Date: 12/06/2019 CLINICAL DATA:  Syncope, slurred speech EXAM: CT HEAD WITHOUT CONTRAST TECHNIQUE: Contiguous axial images were obtained from the base of the skull through the vertex without intravenous contrast. COMPARISON:  None. FINDINGS: Brain: There is low-density/edema throughout the left frontal lobe and temporal lobe. Appearance is concerning for vasogenic edema possibly related to underlying mass lesion. Mass effect with 7 mm of left-to-right midline shift. No hydrocephalus. Vascular: No hyperdense vessel or unexpected  calcification. Skull: No acute calvarial abnormality. Sinuses/Orbits: Visualized paranasal sinuses and mastoids clear. Orbital soft tissues unremarkable. Other: None IMPRESSION: Edema throughout the left frontal and temporal lobes concerning for vasogenic edema and possible underlying mass lesion. Recommend further evaluation with MRI with and without contrast. 7 mm of left right midline shift. Critical Value/emergent results were called by telephone at the time of interpretation on 12/06/2019 at 7:43 pm to provider Fredia Sorrow , who verbally acknowledged these results. Electronically Signed   By: Rolm Baptise M.D.   On: 12/06/2019 19:46   MR Brain W and Wo Contrast  Result Date: 12/07/2019 CLINICAL DATA:  Initial evaluation for acute seizure, possible mass lesion. EXAM:  MRI HEAD WITHOUT AND WITH CONTRAST TECHNIQUE: Multiplanar, multiecho pulse sequences of the brain and surrounding structures were obtained without and with intravenous contrast. CONTRAST:  37mL GADAVIST GADOBUTROL 1 MMOL/ML IV SOLN COMPARISON:  Comparison made with prior CT from 12/06/2019. FINDINGS: Brain: Large confluent area of T2/FLAIR signal abnormality seen involving the left frontal and temporal lobes, centered at the left frontal operculum. Involvement of the left insula and anterior left temporal pole, as well as the anterior left hippocampus. Signal abnormality involves both the cortical gray matter as well as the subcortical and deep cerebral white matter. Signal abnormality extends along the left cortical spinal tract towards the left cerebral peduncle. No associated restricted diffusion or hemorrhage. Following contrast administration, patchy somewhat ill-defined parenchymal enhancement seen along the central and peripheral aspect of this lesion. Associated regional mass effect with partial effacement of the left lateral ventricle and 7 mm of left-to-right shift. No hydrocephalus or ventricular trapping. Basilar cisterns remain  patent. No other focal lesions. No evidence for acute infarct. No other areas of encephalomalacia to suggest prior infarction or other insult. No evidence for acute intracranial hemorrhage. Few small foci of chronic hemosiderin staining noted associated with a DVA at the subcortical anterior right frontal lobe (series 20, image 37). No other mass lesion or abnormal enhancement. No extra-axial fluid collection. Pituitary gland and suprasellar region within normal limits. Vascular: Major intracranial vascular flow voids are maintained. Skull and upper cervical spine: Craniocervical junction within normal limits. Upper cervical spine normal. Bone marrow signal intensity within normal limits. No focal marrow replacing lesion. No scalp soft tissue abnormality. Sinuses/Orbits: Globes and orbital soft tissues within normal limits. Paranasal sinuses and mastoid air cells are largely clear. Inner ear structures grossly normal. Other: None. IMPRESSION: 1. Large confluent area of T2/FLAIR signal abnormality involving the left frontal and temporal lobes with associated patchy post-contrast enhancement. Finding most concerning for a primary CNS neoplasm. Possible encephalitis/cerebritis would be the primary differential consideration, and could be considered in the correct clinical setting. 2. Associated regional mass effect with 7 mm of left-to-right shift. No hydrocephalus or ventricular trapping. 3. Incidental small DVA with associated chronic hemosiderin staining at the anterior right frontal lobe. Electronically Signed   By: Jeannine Boga M.D.   On: 12/07/2019 03:43   CT CHEST ABDOMEN PELVIS W CONTRAST  Result Date: 12/07/2019 CLINICAL DATA:  Brain mass EXAM: CT CHEST, ABDOMEN, AND PELVIS WITH CONTRAST TECHNIQUE: Multidetector CT imaging of the chest, abdomen and pelvis was performed following the standard protocol during bolus administration of intravenous contrast. Oral contrast was administered for the CT  abdomen and pelvis. CONTRAST:  141mL OMNIPAQUE IOHEXOL 300 MG/ML  SOLN COMPARISON:  Chest radiograph December 06, 2019 FINDINGS: CT CHEST FINDINGS Cardiovascular: There is no evident thoracic aortic aneurysm or dissection. Visualized great vessels appear normal. No major vessel pulmonary embolus demonstrable. No pericardial effusion or pericardial thickening. Mediastinum/Nodes: Thyroid normal in appearance. There is no appreciable thoracic adenopathy. No esophageal lesions are appreciable. Lungs/Pleura: Slight scarring is noted in the extreme apices bilaterally. On axial slice 034 series 5, there is a 7 x 4 mm nodular opacity abutting the pleura in the superior segment of the right lower lobe. No other nodular appearing opacities evident. No appreciable edema or airspace opacity. No pleural effusions are evident. Musculoskeletal: No blastic or lytic bone lesions. No evident chest wall lesions. CT ABDOMEN PELVIS FINDINGS Hepatobiliary: There is hepatic steatosis. No focal liver lesions are appreciable. Gallbladder wall is not appreciably thickened. There is  no biliary duct dilatation. Pancreas: There is no pancreatic mass or inflammatory focus. Spleen: No splenic lesions are evident. Adrenals/Urinary Tract: Adrenals bilaterally appear normal. Kidneys bilaterally show no evident mass or hydronephrosis on either side. There is no evident renal or ureteral calculus on either side. Urinary bladder is midline with wall thickness within normal limits. Stomach/Bowel: There is no appreciable bowel wall or mesenteric thickening. No evident bowel obstruction. The terminal ileum appears normal. There is no demonstrable free air or portal venous air. Vascular/Lymphatic: There is no abdominal aortic aneurysm. No arterial vascular lesions are evident. Major venous structures appear patent. There is no appreciable adenopathy in the abdomen or pelvis. Reproductive: Uterus is midline. There is evidence suggesting recent left ovarian  cyst rupture with peripheral enhancement of a cystic mass measuring 2.4 x 2.2 cm. There is fluid tracking from this area into the cul-de-sac region. A small amount of calcification is noted in the posterior cul-de-sac region. No other evidence of pelvic mass by CT. Other: Appendix appears normal. No abscess in the abdomen or pelvis. No ascites beyond the fluid in the cul-de-sac. Musculoskeletal: No blastic or lytic bone lesions. No intramuscular or abdominal wall lesions evident. IMPRESSION: Chest CT: 1. 7 x 4 mm nodular opacity abutting the pleura in the superior segment right lower lobe. Given this finding and the lesion intracranially, a follow-up chest CT in 3 months to assess for stability would be advisable. No other nodular appearing opacities evident. 2.  No evident adenopathy. CT abdomen and pelvis: 1. Findings suggesting recent ovarian cyst rupture with fluid in the left adnexa and cul-de-sac regions. Small amount of calcification in the cul-de-sac region at the mid sacral level likely represents residua of previous hemorrhage or inflammation. This finding does not have an appearance suggesting neoplastic focus. 2. Hepatic steatosis.  No focal liver lesions evident. 3. No adenopathy appreciable in the abdomen or pelvis. No findings suggesting neoplastic focus in the abdomen or pelvis. 4. No bowel obstruction. No abscess in the abdomen pelvis. No appendiceal region inflammation. Electronically Signed   By: Lowella Grip III M.D.   On: 12/07/2019 10:10   DG Chest Port 1 View  Result Date: 12/06/2019 CLINICAL DATA:  Migraine headache, syncope EXAM: PORTABLE CHEST 1 VIEW COMPARISON:  None. FINDINGS: The heart size and mediastinal contours are within normal limits. Both lungs are clear. The visualized skeletal structures are unremarkable. IMPRESSION: No active disease. Electronically Signed   By: Randa Ngo M.D.   On: 12/06/2019 21:00   Impression/Plan   39 y.o. female with transient right sided  N/T, facial spasm and syncopal episode who was found to have a large left frontotemporoparietal infiltrative lesion. Lesion highly concerning for primary CNS neoplasm. Lesion is not resectable and will need to be biopsied for diagnosis and treatment. Dr Kathyrn Sheriff to follow up. - CT C/A/P to r/o other malignancy - continue AED per neurology  Ferne Reus, PA-C [facility]

## 2019-12-07 NOTE — ED Notes (Signed)
Patient transported to CT 

## 2019-12-07 NOTE — Progress Notes (Addendum)
Same day progress note  Patient seen and examined No significant change in exam as documented by Dr. Curly Shores  EEG reveals no seizures or epileptiform discharges.  IMPRESSION: Chest CT: 1. 7 x 4 mm nodular opacity abutting the pleura in the superior segment right lower lobe. Given this finding and the lesion intracranially, a follow-up chest CT in 3 months to assess for stability would be advisable. No other nodular appearing opacities evident. 2.  No evident adenopathy.  CT abdomen and pelvis:  1. Findings suggesting recent ovarian cyst rupture with fluid in the left adnexa and cul-de-sac regions. Small amount of calcification in the cul-de-sac region at the mid sacral level likely represents residua of previous hemorrhage or inflammation. This finding does not have an appearance suggesting neoplastic focus. 2. Hepatic steatosis.  No focal liver lesions evident. 3. No adenopathy appreciable in the abdomen or pelvis. No findings suggesting neoplastic focus in the abdomen or pelvis. 4. No bowel obstruction. No abscess in the abdomen pelvis. No appendiceal region inflammation.  Neurosurgery following-appreciate consultation.  I have reached out to Dr. Mickeal Skinner from neuro-oncology for his neuro oncological recommendations as well.  Continue Keppra for now Maintain seizure precautions   We will update once we have more information.  -- Amie Portland, MD [facility] Pager: [phone] If 7pm to 7am, please call on call as listed on AMION.

## 2019-12-07 NOTE — ED Triage Notes (Signed)
Assume care from EMS, Per ems pt is an transfer from [facility] where pt was having migraines and states when pt have migraines she has baseline slurr speech and facial numbness. EMS reports pt was shopping when she had a syncope episode with LOC. Ct scan was done at [facility] and further follow evaluation is needed for an MRI

## 2019-12-07 NOTE — Procedures (Signed)
Patient Name: Candace [name]  MRN: [id number]  Epilepsy Attending: Lora Havens  Referring Physician/Provider: Dr. Gean Birchwood Date: 12/07/1019 Duration: 25.42 mins  Patient history: 39 year old female who presented with a syncopal event with facial spasm.  EEG to evaluate for seizures.  Level of alertness: Awake, asleep  AEDs during EEG study: Keppra  Technical aspects: This EEG study was done with scalp electrodes positioned according to the 10-20 International system of electrode placement. Electrical activity was acquired at a sampling rate of 500Hz  and reviewed with a high frequency filter of 70Hz  and a low frequency filter of 1Hz . EEG data were recorded continuously and digitally stored.   Description: The posterior dominant rhythm consists of 10 Hz activity of moderate voltage (25-35 uV) seen predominantly in posterior head regions, symmetric and reactive to eye opening and eye closing. Sleep was characterized by vertex waves, sleep spindles (12 to 14 Hz), maximal frontocentral region, POSTs. Physiologic photic driving was seen during photic stimulation.  Hyperventilation was not performed.     IMPRESSION: This study is within normal limits. No seizures or epileptiform discharges were seen throughout the recording.  Candace [name] [name]

## 2019-12-07 NOTE — ED Notes (Signed)
Pt reports having low bp normally. She is also resting.

## 2019-12-07 NOTE — ED Provider Notes (Signed)
Patient transferred from [facility] for neurology evaluation.  Patient had an episode today where she developed numbness in her right arm, right leg, and right side of her face followed by a brief loss of consciousness.  She was seen at [facility] and work-up revealed an abnormal head CT.  This finding was discussed with neurology and patient transferred here for MRI and neurology evaluation.  Patient resting comfortably with no complaints at present.  Vitals are stable.  Neurology has been notified and will come evaluate.   Veryl Speak, MD 12/07/19 [phone]

## 2019-12-07 NOTE — ED Notes (Signed)
Pt and her husband wish to have some time with their pastor.

## 2019-12-07 NOTE — ED Notes (Signed)
Pastor at bedside.

## 2019-12-07 NOTE — ED Notes (Signed)
Patient transported to MRI 

## 2019-12-07 NOTE — Progress Notes (Signed)
EEG complete - results pending 

## 2019-12-07 NOTE — Consult Note (Addendum)
Neurology Consultation Reason for Consult: Abnormal HCT Referring Physician: Dr. Veryl Speak  CC: Face twisting, loss of consciousness, fatigue   History is obtained from: Patient, Husband and chart review   HPI: Candace [name] is a 39 y.o. right-handed female with a past medical history of migraine headaches presenting with a spell of loss of consciousness.  She reports that that she was out shopping when she started to feel like her right leg was weak.  This was followed by a sensation of numbness in her right hand and right face that she did not think much of as she had similar numbness with a migraine headache back in February.  After about 20 minutes of numbness, while she was in a changing area, she began to have slurred speech and twisting of her face that was painful and uncontrollable though she was unable to view her face at the time.  She began to feel generally very very unwell and lost consciousness for approximately 30 minutes.  When she awoke she was nauseated and had an episode of emesis in route to the ED. She thinks her numbness had resolved by the time she awoke and her only residual symptoms from this episode where fatigue and generalized weakness  Regarding her prior headache history, she has a strong family history of migraines in her mother as well as her sisters.  She had intense headaches even as a child as early as kindergarten, which have improved gradually over time with lifestyle changes.  In her 17s she began to have visual auras with some of her headaches which she describes as a sensation of being initially blinded followed by squiggly lines at her periphery moving in her central vision lasting about 30 to 60 minutes followed by a mild headache that comes on slowly after the visual symptoms resolve, with a headache additionally resolving in about 1 hour sometimes with use of minor analgesics.  Notably she does not feel like these headaches qualify as intense migraine  headaches, which she does have separately.  Additionally she has some tension headaches.  She does not feel like her headaches are triggered by any position changes, or worse at any typical time of the day.  Her headaches do not wake her out of sleep on a regular basis though it was not unusual for her to wake up with a headache in the morning in the past.  She has not had a severe migraine headache since last February.  Of note that episode was associated with right hand numbness and right face numbness, but given that these quickly resolved she did not think much of it at the time.  She has not had any other episodes of right sided symptoms other than February and on the day of presentation (9/7)  On review of systems she denies double vision, transient loss of vision, does have some pulsatile tinnitus as well as a feeling of bilateral ear fullness/"allergy sinus pressure" that she notices with any change in position (not worse in any particular position).  In the last few months she has started intermittent fasting as she had gained 9 pounds during 2020, and with this dietary change she has lost 11 pounds.  She has not noticed any excessive fatigue and has been able to continue all of her normal exercises (yoga, gym, light weights) without limitation.  She has not had any fevers, chills, rashes, easy bruising.  She has not had any urinary symptoms or bowel symptoms other than constipation she associates  with her dietary changes  Relevant social history:  She is a Therapist, nutritional by trade  Regarding travel history she has not had any extensive exotic travel other than going [place] in 2019. She did go to [place] with her husband on a work trip recently; she does not recall any tick bites.  They did recently hike at [facility] on Saturday.  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  She  additionally takes turmeric, magnesium, and nettle.   Her husband and her do drink on most days, typically one drink.  Her last drink was 2 days ago.  She does not get withdrawal symptoms if she misses a day of drinking.   History reviewed. No pertinent past medical history.  Other than what is detailed above  Family history: Brothers and sisters have migraines Father had prostate cancer at age 59-84 as well as thyroid cancer in 1999; no history of cancer in his siblings Maternal grandfather had prostate cancer as well  Other social history:  reports that she has never smoked. She has never used smokeless tobacco. She reports previous alcohol use. She reports that she does not use drugs.    Exam: Current vital signs: BP 110/65   Pulse 69   Temp 98.5 F (36.9 C) (Oral)   Resp 16   Ht 5\' 8"  (1.727 m)   Wt 60.8 kg   LMP 11/22/2019   SpO2 100%   BMI 20.37 kg/m  Vital signs in last 24 hours: Temp:  [98.4 F (36.9 C)-98.5 F (36.9 C)] 98.5 F (36.9 C) (09/08 0050) Pulse Rate:  [68-88] 69 (09/08 0130) Resp:  [14-16] 16 (09/08 0050) BP: (89-125)/(54-86) 110/65 (09/08 0130) SpO2:  [100 %] 100 % (09/08 0130) Weight:  [60.8 kg] 60.8 kg (09/08 0050)   Physical Exam  Constitutional: Appears well-developed and well-nourished.  Psych: Affect appropriate to situation Eyes: No scleral injection HENT: No OP obstruction, excellent dentition MSK: no joint deformities.  Cardiovascular: Normal rate and regular rhythm.  Respiratory: Effort normal, non-labored breathing GI: Soft.  No distension. There is no tenderness.  Skin: Warm dry and intact without rashes on visible skin though a fully undressed detailed skin examination was not performed  Neuro: Mental Status: Patient is awake, alert, oriented to person, place, month, year, and situation. Patient is able to give a clear and coherent history. No signs of aphasia or neglect.  She is able to identify numbers drawn on her right hand  and copy complex finger positions.  Naming, repetition, and complex command following are all intact.  She is able to correctly complete 5 serial sevens from 100, spell world backwards, and quickly correctly name the date week backwards.  She is able to name the past 3 presidents easily.  On delayed recall she is only able to remember 3 out of 5 words Cranial Nerves: II: Visual Fields are full. Pupils are equal, round, and reactive to light (5 to 2.5 cm), without an afferent pupillary defect.  .  Funduscopic exam reveals no evidence of papilledema III,IV, VI: EOMI without ptosis or diploplia or nystagmus.  V: Facial sensation is symmetric to temperature VII: Facial movement is symmetric.  VIII: hearing is intact to voice and finger rub is equal bilaterally X: Uvula elevates symmetrically XI: Shoulder shrug is symmetric, head turn is mildly weaker to the left (4 out of 5) XII: tongue is midline without atrophy  or fasciculations.  Motor: Tone is normal. Bulk is normal. 5/5 strength was present in all four extremities (deltoids, biceps, triceps, wrist extension and flexion, hip flexion, knee flexion and extension, foot dorsiflexion and plantar flexion) Sensory: Sensation is symmetric to light touch and temperature in the arms and legs.  The numbness she had is fully resolved Deep Tendon Reflexes: 2+ and symmetric in the biceps, brachioradialis, triceps, patellae and ankles.  Negative Hoffmann's though she does have crossed abductors Plantars: Toes are downgoing bilaterally.  Cerebellar: FNF and toe to hand are intact bilaterally Gait: She is able to easily rise on her heels and toes.  Romberg is negative.  Tandem gait is mildly ataxic  I have reviewed labs in epic and the results pertinent to this consultation are: Mildly elevated albumin of 5.1 and total protein at 8.2.  Mild leukocytosis at 13 and hemoglobin increased at 15.1 Negative pregnancy test, negative SARS-CoV-2 testing  I have  reviewed the images obtained:  Head CT personally reviewed with extensive left-sided cerebral edema and midline shift as detailed in the radiology report  Impression: This is a 39 year old woman with no significant past medical history presenting with a syncopal event with facial spasm preceding the syncope concerning for focal seizure with secondary generalization.  While she does not describe a typical jacksonian march of numbness as would be expected along the somatosensory cortex, the spasming of her face is quite concerning for seizure and in combination with imaging findings, her recurrence rate for another event would be expected to be greater than 80%; thus she should be continued on antiseizure medication at this time.  The extent of that the edema seen on her head CT in combination with a 7 mm midline shift with a remarkably benign exam is suggestive of a very slowly progressive process.   Recommendations: - Keppra 1000 mg BID  - MRI brain w and w/o contrast, further recommendations to follow this imaging  - Routine EEG in the morning  Lesleigh Noe MD-PhD [facility] [phone]  Addendum:  Personally reviewed MRI brain.  There is a contrast-enhancing infiltrative lesion in the left frontotemporoparietal lobes, stable 7 mm midline shift, and a small right frontal DVA (indcidental finding).  Appearance is highly concerning for a primary CNS malignancy.  However given the eloquence of the area, I will screen patient with CT chest abdomen pelvis to confirm there is no other site of possible malignancy.  Appreciate neurosurgery's evaluation for potential biopsy.

## 2019-12-07 NOTE — ED Notes (Signed)
Computer in room kept rebooting.

## 2019-12-07 NOTE — Progress Notes (Signed)
Neurosurgery  Pt seen and examined.  See PA Costella's note for details.  I had a long discussion with the patient and her husband.  Given the extensive infiltrative changes with paucity of clinical neurologic findings and her presentation with seizure, my suspicion is that this represents a primary brain tumor such as a glioma.  The lesion involves most of her left inferior frontal gyrus, her left medial temporal structures, temporal stem, her anterior superior temporal gyrus, insula, medial perforated substance, and posterior internal capsule.  Additionally, there are areas of enhancement in her left inferior frontal gyrus.  She is right handed.  Given this infiltrative phenotype, an aggressive resection would have limited utility and carry significant morbidity.  At this point, I am recommending biopsy of the enhancing portion to ascertain a diagnosis.  Risks, benefits, alternatives and expected convalescence were discussed. Risks discussed included but were not limited to bleeding, pain, infection, seizure, stroke, scar, nondiagnostic tissue, neurologic deficit, coma, and death.  They wished to proceed with surgery tomorrow.  Will obtain BrainLab scans in preparation of stereotactic biopsy.  Her brother-in-law is a former ER physician her at [facility] and I will reach out to him as well.

## 2019-12-08 ENCOUNTER — Encounter (HOSPITAL_COMMUNITY): Payer: Self-pay | Admitting: Internal Medicine

## 2019-12-08 ENCOUNTER — Inpatient Hospital Stay (HOSPITAL_COMMUNITY): Payer: Self-pay | Admitting: Registered Nurse

## 2019-12-08 ENCOUNTER — Encounter (HOSPITAL_COMMUNITY)
Admission: EM | Disposition: A | Discharge status: Home / Self Care | Payer: Self-pay | Source: Home / Self Care | Attending: Family Medicine

## 2019-12-08 HISTORY — PX: FRAMELESS  BIOPSY WITH BRAINLAB: SHX6879

## 2019-12-08 HISTORY — PX: APPLICATION OF CRANIAL NAVIGATION: SHX6578

## 2019-12-08 LAB — MRSA PCR SCREENING: MRSA by PCR: NEGATIVE

## 2019-12-08 LAB — CBC WITH DIFFERENTIAL/PLATELET
Abs Immature Granulocytes: 0.02 10*3/uL (ref 0.00–0.07)
Basophils Absolute: 0 10*3/uL (ref 0.0–0.1)
Basophils Relative: 1 %
Eosinophils Absolute: 0.1 10*3/uL (ref 0.0–0.5)
Eosinophils Relative: 1 %
HCT: 34.3 % — ABNORMAL LOW (ref 36.0–46.0)
Hemoglobin: 11.8 g/dL — ABNORMAL LOW (ref 12.0–15.0)
Immature Granulocytes: 0 %
Lymphocytes Relative: 39 %
Lymphs Abs: 2.3 10*3/uL (ref 0.7–4.0)
MCH: 30.6 pg (ref 26.0–34.0)
MCHC: 34.4 g/dL (ref 30.0–36.0)
MCV: 89.1 fL (ref 80.0–100.0)
Monocytes Absolute: 0.5 10*3/uL (ref 0.1–1.0)
Monocytes Relative: 7 %
Neutro Abs: 3.1 10*3/uL (ref 1.7–7.7)
Neutrophils Relative %: 52 %
Platelets: 232 10*3/uL (ref 150–400)
RBC: 3.85 MIL/uL — ABNORMAL LOW (ref 3.87–5.11)
RDW: 11.9 % (ref 11.5–15.5)
WBC: 6.1 10*3/uL (ref 4.0–10.5)
nRBC: 0 % (ref 0.0–0.2)

## 2019-12-08 LAB — COMPREHENSIVE METABOLIC PANEL
ALT: 13 U/L (ref 0–44)
AST: 22 U/L (ref 15–41)
Albumin: 3.1 g/dL — ABNORMAL LOW (ref 3.5–5.0)
Alkaline Phosphatase: 31 U/L — ABNORMAL LOW (ref 38–126)
Anion gap: 6 (ref 5–15)
BUN: 12 mg/dL (ref 6–20)
CO2: 22 mmol/L (ref 22–32)
Calcium: 8.2 mg/dL — ABNORMAL LOW (ref 8.9–10.3)
Chloride: 111 mmol/L (ref 98–111)
Creatinine, Ser: 0.95 mg/dL (ref 0.44–1.00)
GFR calc Af Amer: >60 mL/min (ref >60)
GFR calc non Af Amer: >60 mL/min (ref >60)
Glucose, Bld: 82 mg/dL (ref 70–99)
Potassium: 3.7 mmol/L (ref 3.5–5.1)
Sodium: 139 mmol/L (ref 135–145)
Total Bilirubin: 0.7 mg/dL (ref 0.3–1.2)
Total Protein: 5.2 g/dL — ABNORMAL LOW (ref 6.5–8.1)

## 2019-12-08 LAB — PROTIME-INR
INR: 1.2 (ref 0.8–1.2)
Prothrombin Time: 14.8 seconds (ref 11.4–15.2)

## 2019-12-08 LAB — PHOSPHORUS: Phosphorus: 3.3 mg/dL (ref 2.5–4.6)

## 2019-12-08 LAB — MAGNESIUM: Magnesium: 2 mg/dL (ref 1.7–2.4)

## 2019-12-08 SURGERY — FRAMELESS BIOPSY WITH BRAINLAB
Anesthesia: General | Site: Head | Laterality: Left

## 2019-12-08 MED ORDER — VANCOMYCIN HCL 750 MG/150ML IV SOLN
750.0000 mg | Freq: Two times a day (BID) | INTRAVENOUS | Status: AC
  Administered 2019-12-09 (×2): 750 mg via INTRAVENOUS
  Filled 2019-12-08 (×2): qty 150

## 2019-12-08 MED ORDER — LIDOCAINE 2% (20 MG/ML) 5 ML SYRINGE
INTRAMUSCULAR | Status: AC
  Filled 2019-12-08: qty 5

## 2019-12-08 MED ORDER — ONDANSETRON HCL 4 MG/2ML IJ SOLN
INTRAMUSCULAR | Status: DC | PRN
  Administered 2019-12-08: 4 mg via INTRAVENOUS

## 2019-12-08 MED ORDER — PROPOFOL 10 MG/ML IV BOLUS
INTRAVENOUS | Status: DC | PRN
  Administered 2019-12-08: 150 mg via INTRAVENOUS
  Administered 2019-12-08: 50 mg via INTRAVENOUS

## 2019-12-08 MED ORDER — FENTANYL CITRATE (PF) 250 MCG/5ML IJ SOLN
INTRAMUSCULAR | Status: DC | PRN
Start: 2019-12-08 — End: 2019-12-08
  Administered 2019-12-08: 100 ug via INTRAVENOUS

## 2019-12-08 MED ORDER — LIDOCAINE-EPINEPHRINE 1 %-1:100000 IJ SOLN
INTRAMUSCULAR | Status: DC | PRN
  Administered 2019-12-08: 10 mL

## 2019-12-08 MED ORDER — EPHEDRINE 5 MG/ML INJ
INTRAVENOUS | Status: AC
  Filled 2019-12-08: qty 10

## 2019-12-08 MED ORDER — ONDANSETRON HCL 4 MG PO TABS: 4.0000 mg | ORAL_TABLET | ORAL | Status: DC | PRN

## 2019-12-08 MED ORDER — HYDROMORPHONE HCL 1 MG/ML IJ SOLN: 0.2500 mg | INTRAMUSCULAR | Status: DC | PRN

## 2019-12-08 MED ORDER — 0.9 % SODIUM CHLORIDE (POUR BTL) OPTIME
TOPICAL | Status: DC | PRN
  Administered 2019-12-08: 1000 mL

## 2019-12-08 MED ORDER — FENTANYL CITRATE (PF) 250 MCG/5ML IJ SOLN
INTRAMUSCULAR | Status: AC
  Filled 2019-12-08: qty 5

## 2019-12-08 MED ORDER — MIDAZOLAM HCL 5 MG/5ML IJ SOLN
INTRAMUSCULAR | Status: DC | PRN
  Administered 2019-12-08: 2 mg via INTRAVENOUS

## 2019-12-08 MED ORDER — DEXAMETHASONE 4 MG PO TABS
4.0000 mg | ORAL_TABLET | Freq: Three times a day (TID) | ORAL | Status: DC
  Administered 2019-12-08 – 2019-12-09 (×4): 4 mg via ORAL
  Filled 2019-12-08 (×3): qty 1

## 2019-12-08 MED ORDER — ROCURONIUM BROMIDE 10 MG/ML (PF) SYRINGE
PREFILLED_SYRINGE | INTRAVENOUS | Status: DC | PRN
  Administered 2019-12-08: 50 mg via INTRAVENOUS

## 2019-12-08 MED ORDER — LACTATED RINGERS IV SOLN: INTRAVENOUS | Status: DC

## 2019-12-08 MED ORDER — DOCUSATE SODIUM 100 MG PO CAPS
100.0000 mg | ORAL_CAPSULE | Freq: Two times a day (BID) | ORAL | Status: DC
  Administered 2019-12-08 – 2019-12-09 (×2): 100 mg via ORAL
  Filled 2019-12-08 (×2): qty 1

## 2019-12-08 MED ORDER — CHLORHEXIDINE GLUCONATE 0.12 % MT SOLN: 15.0000 mL | Freq: Once | OROMUCOSAL | Status: AC

## 2019-12-08 MED ORDER — OXYCODONE HCL 5 MG/5ML PO SOLN: 5.0000 mg | Freq: Once | ORAL | Status: DC | PRN

## 2019-12-08 MED ORDER — LABETALOL HCL 5 MG/ML IV SOLN: 10.0000 mg | INTRAVENOUS | Status: DC | PRN

## 2019-12-08 MED ORDER — ORAL CARE MOUTH RINSE: 15.0000 mL | Freq: Once | OROMUCOSAL | Status: AC

## 2019-12-08 MED ORDER — ONDANSETRON HCL 4 MG/2ML IJ SOLN: 4.0000 mg | INTRAMUSCULAR | Status: DC | PRN

## 2019-12-08 MED ORDER — DEXAMETHASONE 4 MG PO TABS: 2.0000 mg | ORAL_TABLET | Freq: Two times a day (BID) | ORAL | Status: DC

## 2019-12-08 MED ORDER — HYDROCODONE-ACETAMINOPHEN 5-325 MG PO TABS
1.0000 | ORAL_TABLET | ORAL | Status: DC | PRN
  Administered 2019-12-08: 1 via ORAL
  Filled 2019-12-08: qty 1

## 2019-12-08 MED ORDER — MIDAZOLAM HCL 2 MG/2ML IJ SOLN
INTRAMUSCULAR | Status: AC
  Filled 2019-12-08: qty 2

## 2019-12-08 MED ORDER — ACETAMINOPHEN 325 MG PO TABS: 650.0000 mg | ORAL_TABLET | ORAL | Status: DC | PRN

## 2019-12-08 MED ORDER — PROPOFOL 10 MG/ML IV BOLUS
INTRAVENOUS | Status: AC
  Filled 2019-12-08: qty 20

## 2019-12-08 MED ORDER — PROMETHAZINE HCL 25 MG/ML IJ SOLN: 6.2500 mg | INTRAMUSCULAR | Status: DC | PRN

## 2019-12-08 MED ORDER — DEXAMETHASONE 0.5 MG PO TABS: 1.0000 mg | ORAL_TABLET | Freq: Two times a day (BID) | ORAL | Status: DC

## 2019-12-08 MED ORDER — FLEET ENEMA 7-19 GM/118ML RE ENEM: 1.0000 | ENEMA | Freq: Once | RECTAL | Status: DC | PRN

## 2019-12-08 MED ORDER — ACETAMINOPHEN 10 MG/ML IV SOLN: 1000.0000 mg | Freq: Once | INTRAVENOUS | Status: DC | PRN

## 2019-12-08 MED ORDER — THROMBIN 5000 UNITS EX SOLR
CUTANEOUS | Status: AC
  Filled 2019-12-08: qty 5000

## 2019-12-08 MED ORDER — DEXAMETHASONE SODIUM PHOSPHATE 10 MG/ML IJ SOLN
INTRAMUSCULAR | Status: DC | PRN
  Administered 2019-12-08: 10 mg via INTRAVENOUS

## 2019-12-08 MED ORDER — BACITRACIN ZINC 500 UNIT/GM EX OINT
TOPICAL_OINTMENT | CUTANEOUS | Status: DC | PRN
  Administered 2019-12-08: 1 via TOPICAL

## 2019-12-08 MED ORDER — ROCURONIUM BROMIDE 10 MG/ML (PF) SYRINGE
PREFILLED_SYRINGE | INTRAVENOUS | Status: AC
  Filled 2019-12-08: qty 10

## 2019-12-08 MED ORDER — VANCOMYCIN HCL IN DEXTROSE 1-5 GM/200ML-% IV SOLN
INTRAVENOUS | Status: AC
  Administered 2019-12-08: 1000 mg via INTRAVENOUS
  Filled 2019-12-08: qty 200

## 2019-12-08 MED ORDER — OXYCODONE HCL 5 MG PO TABS: 5.0000 mg | ORAL_TABLET | Freq: Once | ORAL | Status: DC | PRN

## 2019-12-08 MED ORDER — ONDANSETRON HCL 4 MG/2ML IJ SOLN
INTRAMUSCULAR | Status: AC
  Filled 2019-12-08: qty 2

## 2019-12-08 MED ORDER — SUGAMMADEX SODIUM 200 MG/2ML IV SOLN
INTRAVENOUS | Status: DC | PRN
  Administered 2019-12-08 (×2): 100 mg via INTRAVENOUS

## 2019-12-08 MED ORDER — EPHEDRINE SULFATE-NACL 50-0.9 MG/10ML-% IV SOSY
PREFILLED_SYRINGE | INTRAVENOUS | Status: DC | PRN
  Administered 2019-12-08: 5 mg via INTRAVENOUS

## 2019-12-08 MED ORDER — DEXAMETHASONE 4 MG PO TABS: 2.0000 mg | ORAL_TABLET | Freq: Three times a day (TID) | ORAL | Status: DC

## 2019-12-08 MED ORDER — MORPHINE SULFATE (PF) 2 MG/ML IV SOLN: 1.0000 mg | INTRAVENOUS | Status: DC | PRN

## 2019-12-08 MED ORDER — BACITRACIN ZINC 500 UNIT/GM EX OINT
TOPICAL_OINTMENT | CUTANEOUS | Status: AC
  Filled 2019-12-08: qty 28.35

## 2019-12-08 MED ORDER — POLYETHYLENE GLYCOL 3350 17 G PO PACK: 17.0000 g | PACK | Freq: Every day | ORAL | Status: DC | PRN

## 2019-12-08 MED ORDER — DEXAMETHASONE SODIUM PHOSPHATE 10 MG/ML IJ SOLN
INTRAMUSCULAR | Status: AC
  Filled 2019-12-08: qty 1

## 2019-12-08 MED ORDER — CHLORHEXIDINE GLUCONATE CLOTH 2 % EX PADS: 6.0000 | MEDICATED_PAD | Freq: Every day | CUTANEOUS | Status: DC

## 2019-12-08 MED ORDER — CHLORHEXIDINE GLUCONATE 0.12 % MT SOLN
OROMUCOSAL | Status: AC
  Administered 2019-12-08: 15 mL via OROMUCOSAL
  Filled 2019-12-08: qty 15

## 2019-12-08 MED ORDER — PANTOPRAZOLE SODIUM 40 MG PO TBEC
40.0000 mg | DELAYED_RELEASE_TABLET | Freq: Every day | ORAL | Status: DC
  Administered 2019-12-08 – 2019-12-09 (×2): 40 mg via ORAL
  Filled 2019-12-08 (×2): qty 1

## 2019-12-08 MED ORDER — LIDOCAINE-EPINEPHRINE 1 %-1:100000 IJ SOLN
INTRAMUSCULAR | Status: AC
  Filled 2019-12-08: qty 1

## 2019-12-08 MED ORDER — DEXAMETHASONE 0.5 MG PO TABS: 1.0000 mg | ORAL_TABLET | Freq: Every day | ORAL | Status: DC

## 2019-12-08 MED ORDER — PROMETHAZINE HCL 25 MG PO TABS: 12.5000 mg | ORAL_TABLET | ORAL | Status: DC | PRN

## 2019-12-08 MED ORDER — LIDOCAINE 2% (20 MG/ML) 5 ML SYRINGE
INTRAMUSCULAR | Status: DC | PRN
  Administered 2019-12-08: 60 mg via INTRAVENOUS

## 2019-12-08 MED ORDER — ENOXAPARIN SODIUM 40 MG/0.4ML ~~LOC~~ SOLN: 40.0000 mg | SUBCUTANEOUS | Status: DC

## 2019-12-08 MED ORDER — ACETAMINOPHEN 650 MG RE SUPP: 650.0000 mg | RECTAL | Status: DC | PRN

## 2019-12-08 MED ORDER — THROMBIN 5000 UNITS EX SOLR
OROMUCOSAL | Status: DC | PRN
  Administered 2019-12-08: 5 mL

## 2019-12-08 SURGICAL SUPPLY — 90 items
BAND RUBBER #18 3X1/16 STRL (MISCELLANEOUS) IMPLANT
BENZOIN TINCTURE PRP APPL 2/3 (GAUZE/BANDAGES/DRESSINGS) IMPLANT
BIT DRILL LONG 3.0X30 (BIT) ×2 IMPLANT
BLADE CLIPPER SURG (BLADE) ×2 IMPLANT
BLADE SAW GIGLI 16 STRL (MISCELLANEOUS) IMPLANT
BLADE SURG 15 STRL LF DISP TIS (BLADE) IMPLANT
BLADE SURG 15 STRL SS (BLADE)
BNDG GAUZE ELAST 4 BULKY (GAUZE/BANDAGES/DRESSINGS) IMPLANT
BNDG STRETCH 4X75 STRL LF (GAUZE/BANDAGES/DRESSINGS) IMPLANT
BUR ACORN 9.0 PRECISION (BURR) ×2 IMPLANT
BUR ROUND FLUTED 4 SOFT TCH (BURR) IMPLANT
BUR SPIRAL ROUTER 2.3 (BUR) ×2 IMPLANT
CANISTER SUCT 3000ML PPV (MISCELLANEOUS) ×4 IMPLANT
CATH VENTRIC 35X38 W/TROCAR LG (CATHETERS) IMPLANT
CLIP VESOCCLUDE MED 6/CT (CLIP) IMPLANT
CNTNR URN SCR LID CUP LEK RST (MISCELLANEOUS) ×1 IMPLANT
CONT SPEC 4OZ STRL OR WHT (MISCELLANEOUS) ×1
COVER MAYO STAND STRL (DRAPES) IMPLANT
COVER WAND RF STERILE (DRAPES) ×2 IMPLANT
DECANTER SPIKE VIAL GLASS SM (MISCELLANEOUS) ×2 IMPLANT
DERMABOND ADVANCED (GAUZE/BANDAGES/DRESSINGS) ×1
DERMABOND ADVANCED .7 DNX12 (GAUZE/BANDAGES/DRESSINGS) ×1 IMPLANT
DRAIN SUBARACHNOID (WOUND CARE) IMPLANT
DRAPE HALF SHEET 40X57 (DRAPES) ×2 IMPLANT
DRAPE MICROSCOPE LEICA (MISCELLANEOUS) IMPLANT
DRAPE NEUROLOGICAL W/INCISE (DRAPES) ×2 IMPLANT
DRAPE SHEET LG 3/4 BI-LAMINATE (DRAPES) ×2 IMPLANT
DRAPE STERI IOBAN 125X83 (DRAPES) IMPLANT
DRAPE SURG 17X23 STRL (DRAPES) IMPLANT
DRAPE WARM FLUID 44X44 (DRAPES) ×2 IMPLANT
DRSG ADAPTIC 3X8 NADH LF (GAUZE/BANDAGES/DRESSINGS) IMPLANT
DRSG TELFA 3X8 NADH (GAUZE/BANDAGES/DRESSINGS) ×2 IMPLANT
DURAPREP 6ML APPLICATOR 50/CS (WOUND CARE) ×2 IMPLANT
ELECT REM PT RETURN 9FT ADLT (ELECTROSURGICAL) ×2
ELECTRODE REM PT RTRN 9FT ADLT (ELECTROSURGICAL) ×1 IMPLANT
EVACUATOR 1/8 PVC DRAIN (DRAIN) IMPLANT
EVACUATOR SILICONE 100CC (DRAIN) IMPLANT
FORCEPS BIPO MALIS IRRIG 9X1.5 (NEUROSURGERY SUPPLIES) ×2 IMPLANT
GAUZE 4X4 16PLY RFD (DISPOSABLE) IMPLANT
GAUZE SPONGE 4X4 12PLY STRL (GAUZE/BANDAGES/DRESSINGS) IMPLANT
GLOVE BIO SURGEON STRL SZ7.5 (GLOVE) ×2 IMPLANT
GLOVE BIOGEL PI IND STRL 7.5 (GLOVE) ×1 IMPLANT
GLOVE BIOGEL PI INDICATOR 7.5 (GLOVE) ×1
GLOVE ECLIPSE 7.5 STRL STRAW (GLOVE) IMPLANT
GLOVE EXAM NITRILE LRG STRL (GLOVE) IMPLANT
GLOVE EXAM NITRILE XL STR (GLOVE) IMPLANT
GLOVE EXAM NITRILE XS STR PU (GLOVE) IMPLANT
GOWN STRL REUS W/ TWL LRG LVL3 (GOWN DISPOSABLE) ×2 IMPLANT
GOWN STRL REUS W/ TWL XL LVL3 (GOWN DISPOSABLE) IMPLANT
GOWN STRL REUS W/TWL 2XL LVL3 (GOWN DISPOSABLE) IMPLANT
GOWN STRL REUS W/TWL LRG LVL3 (GOWN DISPOSABLE) ×2
GOWN STRL REUS W/TWL XL LVL3 (GOWN DISPOSABLE)
HEMOSTAT POWDER KIT SURGIFOAM (HEMOSTASIS) ×2 IMPLANT
HEMOSTAT SURGICEL 2X14 (HEMOSTASIS) ×2 IMPLANT
HOOK DURA 1/2IN (MISCELLANEOUS) ×2 IMPLANT
IV NS 1000ML (IV SOLUTION) ×1
IV NS 1000ML BAXH (IV SOLUTION) ×1 IMPLANT
KIT BASIN OR (CUSTOM PROCEDURE TRAY) ×2 IMPLANT
KIT DRAIN CSF ACCUDRAIN (MISCELLANEOUS) IMPLANT
KIT NEEDLE BIOPSY 1.8X235 CRAN (NEEDLE) ×2 IMPLANT
KIT TURNOVER KIT B (KITS) ×2 IMPLANT
KIT VARIOGUIDE DRILL 1.9 (KITS) ×2 IMPLANT
MARKER SPHERE PSV REFLC 13MM (MARKER) ×4 IMPLANT
NEEDLE HYPO 22GX1.5 SAFETY (NEEDLE) ×2 IMPLANT
NEEDLE SPNL 18GX3.5 QUINCKE PK (NEEDLE) IMPLANT
NS IRRIG 1000ML POUR BTL (IV SOLUTION) ×6 IMPLANT
PACK CRANIOTOMY CUSTOM (CUSTOM PROCEDURE TRAY) ×2 IMPLANT
PATTIES SURGICAL .25X.25 (GAUZE/BANDAGES/DRESSINGS) IMPLANT
PATTIES SURGICAL .5 X.5 (GAUZE/BANDAGES/DRESSINGS) IMPLANT
PATTIES SURGICAL .5 X3 (DISPOSABLE) IMPLANT
PATTIES SURGICAL 1/4 X 3 (GAUZE/BANDAGES/DRESSINGS) IMPLANT
PATTIES SURGICAL 1X1 (DISPOSABLE) IMPLANT
PIN MAYFIELD SKULL DISP (PIN) ×2 IMPLANT
SET TUBING IRRIGATION DISP (TUBING) ×2 IMPLANT
SPECIMEN JAR SMALL (MISCELLANEOUS) IMPLANT
SPONGE NEURO XRAY DETECT 1X3 (DISPOSABLE) IMPLANT
SPONGE SURGIFOAM ABS GEL 100 (HEMOSTASIS) ×2 IMPLANT
STAPLER VISISTAT 35W (STAPLE) ×2 IMPLANT
SUT ETHILON 3 0 FSL (SUTURE) IMPLANT
SUT ETHILON 3 0 PS 1 (SUTURE) IMPLANT
SUT MNCRL AB 3-0 PS2 18 (SUTURE) IMPLANT
SUT NURALON 4 0 TR CR/8 (SUTURE) ×6 IMPLANT
SUT SILK 0 TIES 10X30 (SUTURE) IMPLANT
SUT VIC AB 2-0 CP2 18 (SUTURE) ×2 IMPLANT
TOWEL GREEN STERILE (TOWEL DISPOSABLE) ×2 IMPLANT
TOWEL GREEN STERILE FF (TOWEL DISPOSABLE) ×2 IMPLANT
TRAY FOLEY MTR SLVR 16FR STAT (SET/KITS/TRAYS/PACK) ×2 IMPLANT
TUBE CONNECTING 12X1/4 (SUCTIONS) ×2 IMPLANT
UNDERPAD 30X36 HEAVY ABSORB (UNDERPADS AND DIAPERS) ×2 IMPLANT
WATER STERILE IRR 1000ML POUR (IV SOLUTION) ×2 IMPLANT

## 2019-12-08 NOTE — Plan of Care (Signed)

## 2019-12-08 NOTE — Anesthesia Preprocedure Evaluation (Addendum)
Anesthesia Evaluation  Patient identified by MRN, date of birth, ID band Patient awake    Reviewed: Allergy & Precautions, NPO status , Patient's Chart, lab work & pertinent test results  Airway Mallampati: I  TM Distance: >3 FB Neck ROM: Full    Dental no notable dental hx.    Pulmonary neg pulmonary ROS,    Pulmonary exam normal breath sounds clear to auscultation       Cardiovascular negative cardio ROS Normal cardiovascular exam Rhythm:Regular Rate:Normal  ECG: NSR, rate 86   Neuro/Psych  Headaches, Seizures -,  Left frontoparietal and temporal masslike, confluent T2 hyperintense signal with ill-defined patchy enhancement is unchanged and concerning for primary neoplasm. 6 mm rightward midline shift with partial left lateral ventricle effacement, unchanged. Right frontal developmental venous anomaly. negative psych ROS   GI/Hepatic negative GI ROS, Neg liver ROS,   Endo/Other  negative endocrine ROS  Renal/GU negative Renal ROS     Musculoskeletal negative musculoskeletal ROS (+)   Abdominal   Peds  Hematology  (+) anemia ,   Anesthesia Other Findings Brain lesion  Reproductive/Obstetrics hcg negative                            Anesthesia Physical Anesthesia Plan  ASA: II  Anesthesia Plan: General   Post-op Pain Management:    Induction: Intravenous  PONV Risk Score and Plan: 3 and Ondansetron, Dexamethasone and Treatment may vary due to age or medical condition  Airway Management Planned: Oral ETT  Additional Equipment:   Intra-op Plan:   Post-operative Plan: Extubation in OR  Informed Consent: I have reviewed the patients History and Physical, chart, labs and discussed the procedure including the risks, benefits and alternatives for the proposed anesthesia with the patient or authorized representative who has indicated his/her understanding and acceptance.     Dental  advisory given  Plan Discussed with: CRNA  Anesthesia Plan Comments:        Anesthesia Quick Evaluation

## 2019-12-08 NOTE — Anesthesia Postprocedure Evaluation (Signed)
Anesthesia Post Note  Patient: Candace [name]  Procedure(s) Performed: Left stereotactic brain biopsy with brainlab (Left Head) APPLICATION OF CRANIAL NAVIGATION (Left Head)     Patient location during evaluation: PACU Anesthesia Type: General Level of consciousness: awake Pain management: pain level controlled Vital Signs Assessment: post-procedure vital signs reviewed and stable Respiratory status: spontaneous breathing, nonlabored ventilation, respiratory function stable and patient connected to nasal cannula oxygen Cardiovascular status: blood pressure returned to baseline and stable Postop Assessment: no apparent nausea or vomiting Anesthetic complications: no   No complications documented.  Last Vitals:  Vitals:   12/08/19 1900 12/08/19 2000  BP: 105/66   Pulse: 76   Resp: (!) 23   Temp:  37.2 C  SpO2: 98%     Last Pain:  Vitals:   12/08/19 2000  TempSrc: Oral  PainSc:                  Karyl Kinnier Maninder Deboer

## 2019-12-08 NOTE — Plan of Care (Signed)
Briefly seen. No new complaints. Exam stable from yesterday. Scheduled for OR for stereotactic biopsy with Dr. Marcello Moores today. Continue with Keppra. Not on dexamethasone - will defer to [place] for need to start post op. Answered all medical questions.  -- Amie Portland, MD [facility]

## 2019-12-08 NOTE — Progress Notes (Signed)
PROGRESS NOTE    Candace [name]  XBM:[id number] DOB: 07-Apr-1980 DOA: 12/06/2019 PCP: Patient, No Pcp Per   Chief Complaint  Patient presents with  . Loss of Consciousness    Brief Narrative:  39 yo F with hx of migraine who presented after numbness of R upper and lower extremity which was followed by R facial twitching.  She had episode of LOC lasting about 30 minutes after which the numbness resolved.  She's had head CT with concern for vasogenic edema and underlying mass lesion with 7 mm L right midline shift.  MRI brain with findings concerning for primary CNS neoplasm.  She's been seen by neurology and neurosurgery.  Neurology recommending 1000 mg keppra BID.  EEG was within normal limits.  Neurosurgery planning for biopsy on 9/9.  CT CAP notable for 7x4 mm nodular opacity in superior segment of right lower lobe (needs follow up imaging in 3 months).   She's now s/p biopsy with neurosurgery on 9/9.  Assessment & Plan:   Principal Problem:   Brain mass Active Problems:   Focal seizure ([place])  Concern for Primary CNS neoplasm  Possible Focal Seizure with secondary generalization:  MRI with L frontoparietal and temporal masslike, confluent T2 hyperintense signal with ill defined patchy enhancement, concerning for primary neoplasm.   6 mm rightward midline shift with partial L lateral ventricle effacement.  CT chest/abdomen/pelvis with 7x4 mm nodular opacity in the superior segment of the RLL - recommending f/u CT chest in 3 months to assess of stability.  Also notable for findings suggestive of recent ovarian cyst rupture.  Neurosurgery c/s - s/p bx today 9/9 Neurology c/s, appreciate recs - continue keppra, seizure precautions Neurooncology consult pending  Hx migraine Leukocytosis - resolved Anemia- mild, continue to monitor  DVT prophylaxis: SCD Code Status: full  Family Communication: husband Disposition:   Status is: Inpatient  Remains inpatient appropriate  because:Inpatient level of care appropriate due to severity of illness   Dispo: The patient is from: Home              Anticipated d/c is to: Home              Anticipated d/c date is: 2 days              Patient currently is not medically stable to d/c.   Consultants:   Neurology  Neurosurgery  Neuro oncology  Procedures:  Left stereotactic needle biopsy of brain lesion 9/9  Antimicrobials: Anti-infectives (From admission, onward)   Start     Dose/Rate Route Frequency Ordered Stop   12/09/19 0030  vancomycin (VANCOREADY) IVPB 750 mg/150 mL        750 mg 150 mL/hr over 60 Minutes Intravenous Every 12 hours 12/08/19 1802 12/10/19 0029   12/07/19 1309  vancomycin (VANCOCIN) IVPB 1000 mg/200 mL premix  Status:  Discontinued        1,000 mg 200 mL/hr over 60 Minutes Intravenous 60 min pre-op 12/07/19 1309 12/08/19 1342         Subjective: No new complaints Feels tired with seizure meds  Objective: Vitals:   12/08/19 1620 12/08/19 1638 12/08/19 1700 12/08/19 1800  BP: 113/77  105/73 98/64  Pulse: 70  67 81  Resp: 15  11 19   Temp: (!) 97 F (36.1 C) (!) 96.9 F (36.1 C)    TempSrc:  Axillary    SpO2: 100%  100% 99%  Weight:      Height:  Intake/Output Summary (Last 24 hours) at 12/08/2019 1834 Last data filed at 12/08/2019 1800 Gross per 24 hour  Intake 2143.75 ml  Output 350 ml  Net 1793.75 ml   Filed Weights   12/06/19 1841 12/07/19 0050  Weight: 60.8 kg 60.8 kg    Examination:  General exam: Appears calm and comfortable  Respiratory system: Clear to auscultation. Respiratory effort normal. Cardiovascular system: S1 & S2 heard, RRR.  Gastrointestinal system: Abdomen is nondistended, soft and nontender. Central nervous system: Alert and oriented. No focal neurological deficits. Extremities: no LEE Skin: No rashes, lesions or ulcers Psychiatry: Judgement and insight appear normal. Mood & affect appropriate.     Data Reviewed: I have personally  reviewed following labs and imaging studies  CBC: Recent Labs  Lab 12/06/19 2043 12/07/19 0616 12/08/19 0346  WBC 13.0* 7.1 6.1  NEUTROABS 11.8* 4.9 3.1  HGB 15.1* 12.5 11.8*  HCT 42.6 37.2 34.3*  MCV 86.6 88.4 89.1  PLT 295 260 416    Basic Metabolic Panel: Recent Labs  Lab 12/06/19 2043 12/07/19 0616 12/08/19 0346  NA 137 138 139  K 3.6 3.9 3.7  CL 103 109 111  CO2 24 20* 22  GLUCOSE 103* 90 82  BUN 13 9 12   CREATININE 0.81 0.91 0.95  CALCIUM 9.2 8.3* 8.2*  MG  --   --  2.0  PHOS  --   --  3.3    GFR: Estimated Creatinine Clearance: 77.8 mL/min (by C-G formula based on SCr of 0.95 mg/dL).  Liver Function Tests: Recent Labs  Lab 12/06/19 2043 12/07/19 0616 12/08/19 0346  AST 22 24 22   ALT 18 15 13   ALKPHOS 44 33* 31*  BILITOT 0.7 0.9 0.7  PROT 8.2* 6.0* 5.2*  ALBUMIN 5.1* 3.6 3.1*    CBG: No results for input(s): GLUCAP in the last 168 hours.   Recent Results (from the past 240 hour(s))  SARS Coronavirus 2 by RT PCR (hospital order, performed in [facility] hospital lab) Nasopharyngeal Nasopharyngeal Swab     Status: None   Collection Time: 12/06/19  8:43 PM   Specimen: Nasopharyngeal Swab  Result Value Ref Range Status   SARS Coronavirus 2 NEGATIVE NEGATIVE Final    Comment: (NOTE) SARS-CoV-2 target nucleic acids are NOT DETECTED.  The SARS-CoV-2 RNA is generally detectable in upper and lower respiratory specimens during the acute phase of infection. The lowest concentration of SARS-CoV-2 viral copies this assay can detect is 250 copies / mL. Marwin Primmer negative result does not preclude SARS-CoV-2 infection and should not be used as the sole basis for treatment or other patient management decisions.  Kalynn Declercq negative result may occur with improper specimen collection / handling, submission of specimen other than nasopharyngeal swab, presence of viral mutation(s) within the areas targeted by this assay, and inadequate number of viral copies (<250 copies /  mL). Kaleigh Spiegelman negative result must be combined with clinical observations, patient history, and epidemiological information.  Fact Sheet for Patients:   StrictlyIdeas.no  Fact Sheet for Healthcare Providers: BankingDealers.co.za  This test is not yet approved or  cleared by the [place] FDA and has been authorized for detection and/or diagnosis of SARS-CoV-2 by FDA under an Emergency Use Authorization (EUA).  This EUA will remain in effect (meaning this test can be used) for the duration of the COVID-19 declaration under Section 564(b)(1) of the Act, 21 U.S.C. section 360bbb-3(b)(1), unless the authorization is terminated or revoked sooner.  Performed at [facility], [address] [place], [place] [address]          Radiology Studies: EEG  Result Date: 12/07/2019 Lora Havens, MD     12/07/2019  9:13 AM Patient Name: Anjelita Sheahan MRN: [id number] Epilepsy Attending: Lora Havens Referring Physician/Provider: Dr. Gean Birchwood Date: 12/07/1019 Duration: 25.42 mins Patient history: 39 year old female who presented with Liddie Chichester syncopal event with facial spasm.  EEG to evaluate for seizures. Level of alertness: Awake, asleep AEDs during EEG study: Keppra Technical aspects: This EEG study was done with scalp electrodes positioned according to the 10-20 International system of electrode placement. Electrical activity was acquired at Jaydeen Darley sampling rate of 500Hz  and reviewed with Brityn Mastrogiovanni high frequency filter of 70Hz  and Mariapaula Krist low frequency filter of 1Hz . EEG data were recorded continuously and digitally stored. Description: The posterior dominant rhythm consists of 10 Hz activity of moderate voltage (25-35 uV) seen predominantly in posterior head regions, symmetric and reactive to eye opening and eye closing. Sleep was characterized by vertex waves, sleep spindles (12 to 14 Hz), maximal frontocentral region, POSTs. Physiologic photic driving  was seen during photic stimulation.  Hyperventilation was not performed.   IMPRESSION: This study is within normal limits. No seizures or epileptiform discharges were seen throughout the recording. Priyanka [name]   CT HEAD WO CONTRAST  Result Date: 12/07/2019 CLINICAL DATA:  Brain mass or lesion, brain lab protocol. EXAM: CT HEAD WITHOUT CONTRAST TECHNIQUE: Contiguous axial images were obtained from the base of the skull through the vertex without intravenous contrast. COMPARISON:  Brain MRI 12/07/2019, head CT 12/06/2019. FINDINGS: Brain: There is residual circulating contrast material. Again demonstrated is prominent predominantly vasogenic edema within the left cerebral hemisphere. This predominantly affects the left frontal and temporal lobes as well as left internal and external capsules as well as left insula. However, to Delmore Sear lesser degree, portions of the left parietal lobe are involved. These findings are unchanged. More conspicuous than on the prior head CT of 12/06/2019, there is Arshia Spellman hyperdense ovoid masslike region within the left frontal lobe centered along the gray-white junction (for instance as seen on series 4, image 19). This measures 3.4 x 2.7 cm in transaxial dimensions. Given the residual circulating contrast material, this is favored to reflect enhancement of an underlying mass. Fatigue hemorrhage is difficult to definitively exclude. Unchanged mass effect with partial effacement of the left lateral ventricle and 7 mm rightward midline shift. No extra-axial fluid collection. Vascular: Circulating contrast material limits evaluation for hyperdense vessels Skull: Normal. Negative for fracture or focal lesion. Sinuses/Orbits: Visualized orbits show no acute finding. No significant paranasal sinus disease or mastoid effusion at the imaged levels. IMPRESSION: More conspicuous than on the prior head CT of 12/06/2019, there is Bronda Alfred 2.7 x 3.4 cm ovoid mass-like region of hyperdensity within the  mid-to-posterior left frontal lobe centered along the gray-white junction. Given the presence of residual circulating contrast material, this is favored to reflect enhancement of an underlying mass. Petechial hemorrhage at this site is difficult to definitively exclude. Unchanged prominent surrounding edema within the left cerebral hemisphere. Unchanged mass effect with partial effacement of the left lateral ventricle and 7 mm rightward midline shift. Electronically Signed   By: Kellie Simmering DO   On: 12/07/2019 13:34   CT Head Wo Contrast  Result Date: 12/06/2019 CLINICAL DATA:  Syncope, slurred speech EXAM: CT HEAD WITHOUT CONTRAST TECHNIQUE: Contiguous axial images were obtained from the base of the skull through the vertex without intravenous contrast. COMPARISON:  None.  FINDINGS: Brain: There is low-density/edema throughout the left frontal lobe and temporal lobe. Appearance is concerning for vasogenic edema possibly related to underlying mass lesion. Mass effect with 7 mm of left-to-right midline shift. No hydrocephalus. Vascular: No hyperdense vessel or unexpected calcification. Skull: No acute calvarial abnormality. Sinuses/Orbits: Visualized paranasal sinuses and mastoids clear. Orbital soft tissues unremarkable. Other: None IMPRESSION: Edema throughout the left frontal and temporal lobes concerning for vasogenic edema and possible underlying mass lesion. Recommend further evaluation with MRI with and without contrast. 7 mm of left right midline shift. Critical Value/emergent results were called by telephone at the time of interpretation on 12/06/2019 at 7:43 pm to provider Fredia Sorrow , who verbally acknowledged these results. Electronically Signed   By: Rolm Baptise M.D.   On: 12/06/2019 19:46   MR BRAIN W WO CONTRAST  Result Date: 12/07/2019 CLINICAL DATA:  Brain mass or lesion. EXAM: MRI HEAD WITHOUT AND WITH CONTRAST TECHNIQUE: Multiplanar, multiecho pulse sequences of the brain and surrounding  structures were obtained without and with intravenous contrast. CONTRAST:  66mL GADAVIST GADOBUTROL 1 MMOL/ML IV SOLN COMPARISON:  12/06/2019 and 12/07/2019 head CTs. 12/07/2019 MRI head. FINDINGS: Brain: Redemonstration of mass-like confluent T2/FLAIR hyperintense signal involving the left frontoparietal and temporal regions. There is unchanged extension of abnormal signal into the insula, thalamus and basal ganglia. Associated ill-defined patchy enhancement is unchanged. No restricted diffusion or intracranial hemorrhage. Rightward midline shift of 6 mm and partial effacement of the left lateral ventricle, grossly unchanged. No extra-axial fluid collection. Vascular: Normal flow voids. Right frontal developmental venous anomaly. Skull and upper cervical spine: Normal marrow signal. Sinuses/Orbits: Normal orbits. Clear paranasal sinuses. No mastoid effusion. Other: None. 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 6 mm rightward midline shift with partial left lateral ventricle effacement, unchanged. Right frontal developmental venous anomaly. Electronically Signed   By: Primitivo Gauze M.D.   On: 12/07/2019 16:12   MR Brain W and Wo Contrast  Result Date: 12/07/2019 CLINICAL DATA:  Initial evaluation for acute seizure, possible mass lesion. EXAM: MRI HEAD WITHOUT AND WITH CONTRAST TECHNIQUE: Multiplanar, multiecho pulse sequences of the brain and surrounding structures were obtained without and with intravenous contrast. CONTRAST:  9mL GADAVIST GADOBUTROL 1 MMOL/ML IV SOLN COMPARISON:  Comparison made with prior CT from 12/06/2019. FINDINGS: Brain: Large confluent area of T2/FLAIR signal abnormality seen involving the left frontal and temporal lobes, centered at the left frontal operculum. Involvement of the left insula and anterior left temporal pole, as well as the anterior left  hippocampus. Signal abnormality involves both the cortical gray matter as well as the subcortical and deep cerebral white matter. Signal abnormality extends along the left cortical spinal tract towards the left cerebral peduncle. No associated restricted diffusion or hemorrhage. Following contrast administration, patchy somewhat ill-defined parenchymal enhancement seen along the central and peripheral aspect of this lesion. Associated regional mass effect with partial effacement of the left lateral ventricle and 7 mm of left-to-right shift. No hydrocephalus or ventricular trapping. Basilar cisterns remain patent. No other focal lesions. No evidence for acute infarct. No other areas of encephalomalacia to suggest prior infarction or other insult. No evidence for acute intracranial hemorrhage. Few small foci of chronic hemosiderin staining noted associated with Denny Lave DVA at the subcortical anterior right frontal lobe (series 20, image 37). No other mass lesion or abnormal enhancement. No extra-axial fluid collection. Pituitary gland and suprasellar region within normal limits.  Vascular: Major intracranial vascular flow voids are maintained. Skull and upper cervical spine: Craniocervical junction within normal limits. Upper cervical spine normal. Bone marrow signal intensity within normal limits. No focal marrow replacing lesion. No scalp soft tissue abnormality. Sinuses/Orbits: Globes and orbital soft tissues within normal limits. Paranasal sinuses and mastoid air cells are largely clear. Inner ear structures grossly normal. Other: None. IMPRESSION: 1. Large confluent area of T2/FLAIR signal abnormality involving the left frontal and temporal lobes with associated patchy post-contrast enhancement. Finding most concerning for Phoenix Riesen primary CNS neoplasm. Possible encephalitis/cerebritis would be the primary differential consideration, and could be considered in the correct clinical setting. 2. Associated regional mass effect  with 7 mm of left-to-right shift. No hydrocephalus or ventricular trapping. 3. Incidental small DVA with associated chronic hemosiderin staining at the anterior right frontal lobe. Electronically Signed   By: Jeannine Boga M.D.   On: 12/07/2019 03:43   CT CHEST ABDOMEN PELVIS W CONTRAST  Result Date: 12/07/2019 CLINICAL DATA:  Brain mass EXAM: CT CHEST, ABDOMEN, AND PELVIS WITH CONTRAST TECHNIQUE: Multidetector CT imaging of the chest, abdomen and pelvis was performed following the standard protocol during bolus administration of intravenous contrast. Oral contrast was administered for the CT abdomen and pelvis. CONTRAST:  132mL OMNIPAQUE IOHEXOL 300 MG/ML  SOLN COMPARISON:  Chest radiograph December 06, 2019 FINDINGS: CT CHEST FINDINGS Cardiovascular: There is no evident thoracic aortic aneurysm or dissection. Visualized great vessels appear normal. No major vessel pulmonary embolus demonstrable. No pericardial effusion or pericardial thickening. Mediastinum/Nodes: Thyroid normal in appearance. There is no appreciable thoracic adenopathy. No esophageal lesions are appreciable. Lungs/Pleura: Slight scarring is noted in the extreme apices bilaterally. On axial slice 122 series 5, there is Makylee Sanborn 7 x 4 mm nodular opacity abutting the pleura in the superior segment of the right lower lobe. No other nodular appearing opacities evident. No appreciable edema or airspace opacity. No pleural effusions are evident. Musculoskeletal: No blastic or lytic bone lesions. No evident chest wall lesions. CT ABDOMEN PELVIS FINDINGS Hepatobiliary: There is hepatic steatosis. No focal liver lesions are appreciable. Gallbladder wall is not appreciably thickened. There is no biliary duct dilatation. Pancreas: There is no pancreatic mass or inflammatory focus. Spleen: No splenic lesions are evident. Adrenals/Urinary Tract: Adrenals bilaterally appear normal. Kidneys bilaterally show no evident mass or hydronephrosis on either side.  There is no evident renal or ureteral calculus on either side. Urinary bladder is midline with wall thickness within normal limits. Stomach/Bowel: There is no appreciable bowel wall or mesenteric thickening. No evident bowel obstruction. The terminal ileum appears normal. There is no demonstrable free air or portal venous air. Vascular/Lymphatic: There is no abdominal aortic aneurysm. No arterial vascular lesions are evident. Major venous structures appear patent. There is no appreciable adenopathy in the abdomen or pelvis. Reproductive: Uterus is midline. There is evidence suggesting recent left ovarian cyst rupture with peripheral enhancement of Abdulloh Ullom cystic mass measuring 2.4 x 2.2 cm. There is fluid tracking from this area into the cul-de-sac region. Brittinee Risk small amount of calcification is noted in the posterior cul-de-sac region. No other evidence of pelvic mass by CT. Other: Appendix appears normal. No abscess in the abdomen or pelvis. No ascites beyond the fluid in the cul-de-sac. Musculoskeletal: No blastic or lytic bone lesions. No intramuscular or abdominal wall lesions evident. IMPRESSION: Chest CT: 1. 7 x 4 mm nodular opacity abutting the pleura in the superior segment right lower lobe. Given this finding and the lesion intracranially, Kaniel Kiang follow-up chest CT  in 3 months to assess for stability would be advisable. No other nodular appearing opacities evident. 2.  No evident adenopathy. CT abdomen and pelvis: 1. Findings suggesting recent ovarian cyst rupture with fluid in the left adnexa and cul-de-sac regions. Small amount of calcification in the cul-de-sac region at the mid sacral level likely represents residua of previous hemorrhage or inflammation. This finding does not have an appearance suggesting neoplastic focus. 2. Hepatic steatosis.  No focal liver lesions evident. 3. No adenopathy appreciable in the abdomen or pelvis. No findings suggesting neoplastic focus in the abdomen or pelvis. 4. No bowel  obstruction. No abscess in the abdomen pelvis. No appendiceal region inflammation. Electronically Signed   By: Lowella Grip III M.D.   On: 12/07/2019 10:10   DG Chest Port 1 View  Result Date: 12/06/2019 CLINICAL DATA:  Migraine headache, syncope EXAM: PORTABLE CHEST 1 VIEW COMPARISON:  None. FINDINGS: The heart size and mediastinal contours are within normal limits. Both lungs are clear. The visualized skeletal structures are unremarkable. IMPRESSION: No active disease. Electronically Signed   By: Randa Ngo M.D.   On: 12/06/2019 21:00        Scheduled Meds: . Chlorhexidine Gluconate Cloth  6 each Topical Daily  . dexamethasone  4 mg Oral Q8H   Followed by  . [START ON 12/10/2019] dexamethasone  2 mg Oral Q8H   Followed by  . [START ON 12/12/2019] dexamethasone  2 mg Oral Q12H   Followed by  . [START ON 12/14/2019] dexamethasone  1 mg Oral Q12H   Followed by  . [START ON 12/16/2019] dexamethasone  1 mg Oral Daily  . docusate sodium  100 mg Oral BID  . [START ON 12/09/2019] enoxaparin (LOVENOX) injection  40 mg Subcutaneous Q24H  . levETIRAcetam  1,000 mg Oral BID  . pantoprazole  40 mg Oral Daily   Continuous Infusions: . sodium chloride Stopped (12/08/19 1728)  . [START ON 12/09/2019] vancomycin       LOS: 1 day    Time spent: over 30 min    Fayrene Helper, MD [facility]   To contact the attending provider between 7A-7P or the covering provider during after hours 7P-7A, please log into the web site [email or web address] and access using universal [place] password for that web site. If you do not have the password, please call the hospital operator.  12/08/2019, 6:34 PM

## 2019-12-08 NOTE — Op Note (Signed)
Procedure(s): Left stereotactic needle biopsy of brain lesion  Candace [name] female 39 y.o. 12/08/2019   Surgeon(s) and Role:    Marcello Moores, Dorcas Carrow, MD - Primary Dr. Kristeen Miss.   - Assisting   Indications: This is a 39 year old woman who presented with a generalized seizure and was found to have a infiltrative brain lesion with area of hazy enhancement concerning for primary brain tumor.  Metastatic work-up was negative.  Stereotactic biopsy was recommended to ascertain the diagnosis.  Risks, benefits, alternatives, and expected convalescence were discussed with the patient and her husband, as well as her brother-in-law..  Risks discussed included but were not limited to bleeding, pain, infection, seizure, stroke, scar, nondiagnostic sample, neurologic deficit, coma, and death.  Informed consent was obtained.  Surgeon: Vallarie Mare   Assistants: Kristeen Miss, MD.  No qualified trainees were available to assist with the procedure.  Anesthesia: General endotracheal anesthesia   Procedure in detail:  The patient was brought to the operating room.  A timeout was performed.  General anesthesia was induced and patient was intubated by the anesthesia service.  After appropriate lines and monitors were placed, patient's head was turned to the right and placed in a [place] head holder and affixed to the bed.  A preoperative thin cut MRI was fused to a preoperative thin cut CT and used to create a 3D surface image which was used for registration using the [employer] system.  A trajectory avoiding major blood vessels and cortical veins into the enhancing portion of the lesion was planned.  The entry point was marked on the skin.  A few strands of hair were clipped and the scalp was preprepped with alcohol and prepped and draped in sterile fashion.  1% lidocaine with epinephrine was injected to skin.  Preoperative antibiotics and dexamethasone was administered and a timeout was performed.  A  small stab incision was made on the scalp.  The variable guide arm was locked into the appropriate trajectory.  A high-speed drill was placed in the reducing cannula used to drill the skull.  The dura was punctured with a trocar instrument.  A navigated needle was then passed to the target and multiple samples were obtained.  About 1-1/2 cm deeper, additional tissue was obtained.  Some of the superficial tissue was sent for frozen section and returned as lesional tissue, likely representing a primary brain tumor.  There was no significant bleeding encountered during the biopsies.  The needle was withdrawn and the small stab wound was irrigated thoroughly.  Skin was closed with 3-0 Vicryl repeat in interrupted fashion followed by Dermabond.  Patient was then removed from the [place] head holder and extubated by the anesthesia service moving all extremities and following commands.  All counts were correct at the end of surgery.  No complications were noted.    Findings: Lesional tissue, likely primary brain tumor  Estimated Blood Loss:  < 5 ml         Drains: none         Specimens: Left frontal lobe lesion, superficial and deep         Implants: None        Complications:  * No complications entered in OR log *         Disposition: to PACU in stable condition         Condition: stable

## 2019-12-08 NOTE — Progress Notes (Signed)
Pharmacy Antibiotic Note  Leronda Lewers is a 39 y.o. female admitted on 12/06/2019 with concern for brain tumor, s/p stereotactic biopsy 9/9.  Pharmacy has been consulted for vancomycin dosing x 24 hours for surgical prophylaxis post-op. Vancomycin 1g IV pre-op dose given at 1242 today. SCr 0.81 on 9/7, increased a bit to 0.95 today.  Plan: Vancomycin 750mg  IV q12h to complete 24 hrs therapy post-op per consult Monitor renal function Pharmacy will sign off consult and monitor peripherally   Height: 5\' 8"  (172.7 cm) Weight: 60.8 kg (134 lb) IBW/kg (Calculated) : 63.9  Temp (24hrs), Avg:98 F (36.7 C), Min:96.9 F (36.1 C), Max:98.8 F (37.1 C)  Recent Labs  Lab 12/06/19 2043 12/07/19 0616 12/08/19 0346  WBC 13.0* 7.1 6.1  CREATININE 0.81 0.91 0.95    Estimated Creatinine Clearance: 77.8 mL/min (by C-G formula based on SCr of 0.95 mg/dL).    Allergies  Allergen Reactions  . Penicillins Rash and Other (See Comments)    Pt does not recall so well    Arturo Morton, PharmD, BCPS Please check AMION for all [facility] contact numbers Clinical Pharmacist 12/08/2019 6:00 PM

## 2019-12-08 NOTE — Anesthesia Procedure Notes (Signed)
Procedure Name: Intubation Date/Time: 12/08/2019 2:04 PM Performed by: Trinna Post., CRNA Pre-anesthesia Checklist: Patient identified, Emergency Drugs available, Suction available, Patient being monitored and Timeout performed Patient Re-evaluated:Patient Re-evaluated prior to induction Oxygen Delivery Method: Circle system utilized Preoxygenation: Pre-oxygenation with 100% oxygen Induction Type: IV induction Ventilation: Mask ventilation without difficulty Laryngoscope Size: Mac and 3 Grade View: Grade I Tube type: Oral Tube size: 7.0 mm Number of attempts: 1 Airway Equipment and Method: Stylet Placement Confirmation: ETT inserted through vocal cords under direct vision,  positive ETCO2 and breath sounds checked- equal and bilateral Secured at: 22 cm Tube secured with: Tape Dental Injury: Teeth and Oropharynx as per pre-operative assessment

## 2019-12-08 NOTE — Transfer of Care (Signed)
Immediate Anesthesia Transfer of Care Note  Patient: Candace [name]  Procedure(s) Performed: Left stereotactic brain biopsy with brainlab (Left Head) APPLICATION OF CRANIAL NAVIGATION (Left Head)  Patient Location: PACU  Anesthesia Type:General  Level of Consciousness: awake, alert  and oriented  Airway & Oxygen Therapy: Patient Spontanous Breathing and Patient connected to face mask oxygen  Post-op Assessment: Report given to RN and Post -op Vital signs reviewed and stable  Post vital signs: Reviewed and stable  Last Vitals:  Vitals Value Taken Time  BP 111/45 12/08/19 1550  Temp 36.1 C 12/08/19 1550  Pulse 86 12/08/19 1554  Resp 21 12/08/19 1554  SpO2 100 % 12/08/19 1554  Vitals shown include unvalidated device data.  Last Pain:  Vitals:   12/08/19 1141  TempSrc: Oral  PainSc:          Complications: No complications documented.

## 2019-12-08 NOTE — Progress Notes (Signed)
Neurosurgery  Patient seen and examined. Neurologically unchanged. Speech with minimal word finding difficulties. No drift. I did discuss with patient the possibility of non-diagnostic tissue sample as well as possible focal speech deficits following biopsy that would likely be temporary. Risks, benefits, alternatives and expected convalescence were discussed. She wished proceed with surgery. Informed consent was obtained.

## 2019-12-09 ENCOUNTER — Inpatient Hospital Stay (HOSPITAL_COMMUNITY): Payer: Self-pay

## 2019-12-09 ENCOUNTER — Encounter (HOSPITAL_COMMUNITY): Payer: Self-pay | Admitting: Neurosurgery

## 2019-12-09 DIAGNOSIS — G9389 Other specified disorders of brain: Secondary | ICD-10-CM

## 2019-12-09 LAB — CBC WITH DIFFERENTIAL/PLATELET
Abs Immature Granulocytes: 0.05 10*3/uL (ref 0.00–0.07)
Basophils Absolute: 0 10*3/uL (ref 0.0–0.1)
Basophils Relative: 0 %
Eosinophils Absolute: 0 10*3/uL (ref 0.0–0.5)
Eosinophils Relative: 0 %
HCT: 41.9 % (ref 36.0–46.0)
Hemoglobin: 14.4 g/dL (ref 12.0–15.0)
Immature Granulocytes: 1 %
Lymphocytes Relative: 5 %
Lymphs Abs: 0.6 10*3/uL — ABNORMAL LOW (ref 0.7–4.0)
MCH: 30.3 pg (ref 26.0–34.0)
MCHC: 34.4 g/dL (ref 30.0–36.0)
MCV: 88.2 fL (ref 80.0–100.0)
Monocytes Absolute: 0.3 10*3/uL (ref 0.1–1.0)
Monocytes Relative: 3 %
Neutro Abs: 10 10*3/uL — ABNORMAL HIGH (ref 1.7–7.7)
Neutrophils Relative %: 91 %
Platelets: 268 10*3/uL (ref 150–400)
RBC: 4.75 MIL/uL (ref 3.87–5.11)
RDW: 11.6 % (ref 11.5–15.5)
WBC: 10.9 10*3/uL — ABNORMAL HIGH (ref 4.0–10.5)
nRBC: 0 % (ref 0.0–0.2)

## 2019-12-09 LAB — COMPREHENSIVE METABOLIC PANEL
ALT: 19 U/L (ref 0–44)
AST: 25 U/L (ref 15–41)
Albumin: 4.1 g/dL (ref 3.5–5.0)
Alkaline Phosphatase: 39 U/L (ref 38–126)
Anion gap: 10 (ref 5–15)
BUN: 9 mg/dL (ref 6–20)
CO2: 21 mmol/L — ABNORMAL LOW (ref 22–32)
Calcium: 9.3 mg/dL (ref 8.9–10.3)
Chloride: 107 mmol/L (ref 98–111)
Creatinine, Ser: 0.83 mg/dL (ref 0.44–1.00)
GFR calc Af Amer: >60 mL/min (ref >60)
GFR calc non Af Amer: >60 mL/min (ref >60)
Glucose, Bld: 121 mg/dL — ABNORMAL HIGH (ref 70–99)
Potassium: 4 mmol/L (ref 3.5–5.1)
Sodium: 138 mmol/L (ref 135–145)
Total Bilirubin: 0.8 mg/dL (ref 0.3–1.2)
Total Protein: 7.2 g/dL (ref 6.5–8.1)

## 2019-12-09 LAB — PHOSPHORUS: Phosphorus: 3.7 mg/dL (ref 2.5–4.6)

## 2019-12-09 LAB — MAGNESIUM: Magnesium: 2.1 mg/dL (ref 1.7–2.4)

## 2019-12-09 IMAGING — CT CT HEAD W/O CM
3 of 4 series · 13 of 47 positions shown, 15 images · non-contrast
Comparison: Head CT from 2 days ago

CLINICAL DATA: Brain mass or lesion.

EXAM:
CT HEAD WITHOUT CONTRAST
TECHNIQUE: Contiguous axial images were obtained from the base of the skull
through the vertex without intravenous contrast.

[Series 3: head without · axial · non-contrast · 0.40mm/px · z∈[-91,+29]mm · 7 of 32 slices shown, 9 images]
[im 4/32  brain]
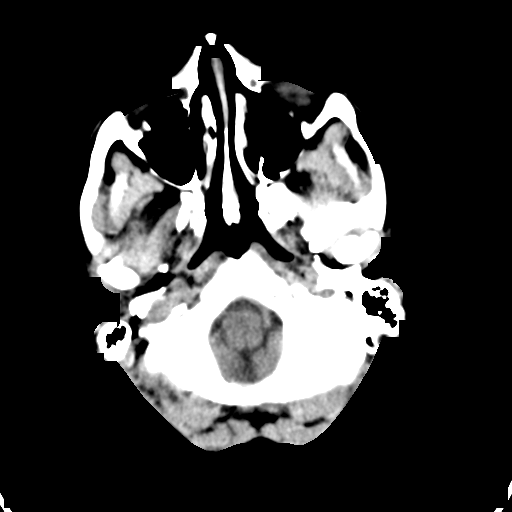
[im 4/32  bone]
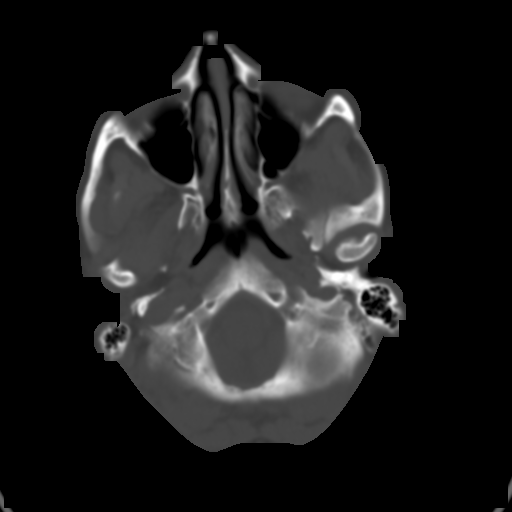
[im 8/32  brain]
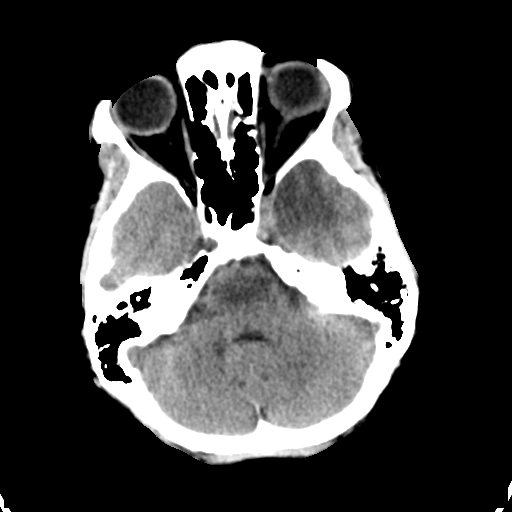
[im 12/32  brain]
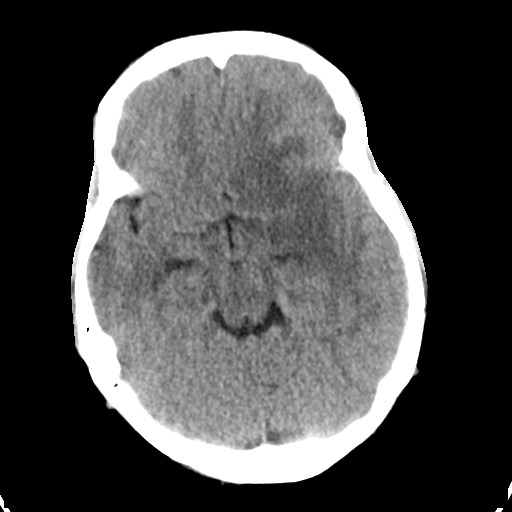
[im 16/32  brain]
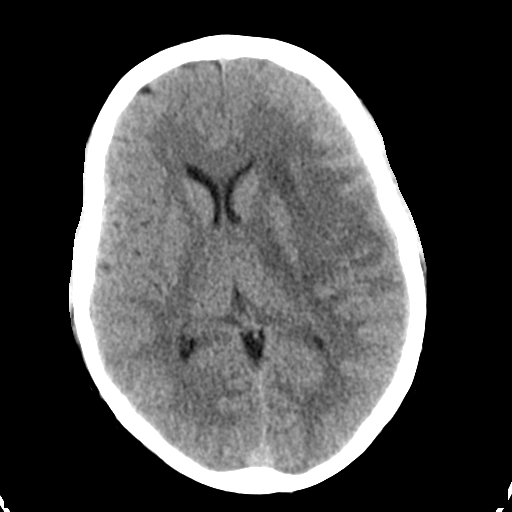
[im 20/32  brain]
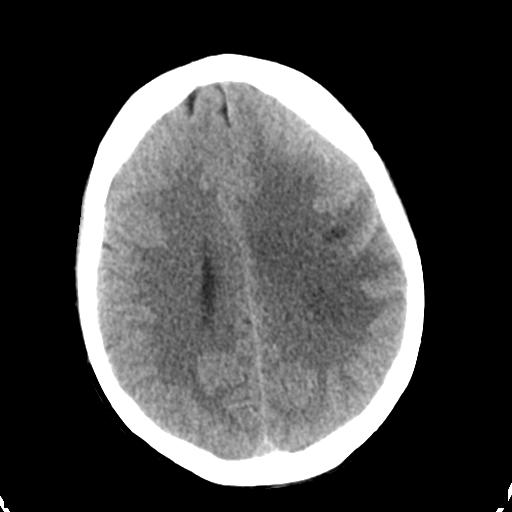
[im 20/32  bone]
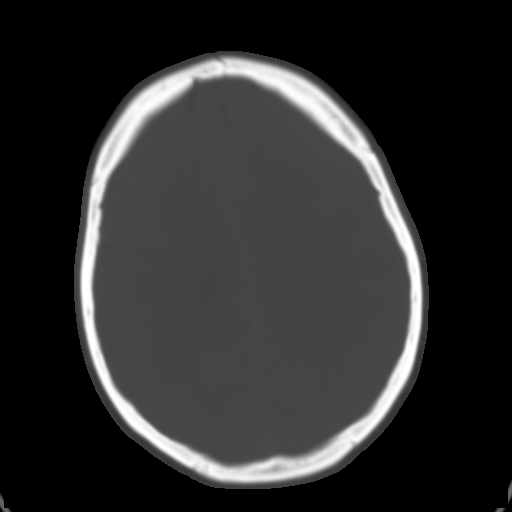
[im 24/32  brain]
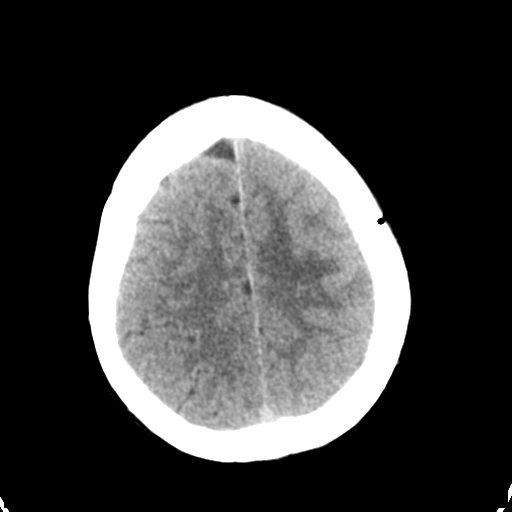
[im 28/32  brain]
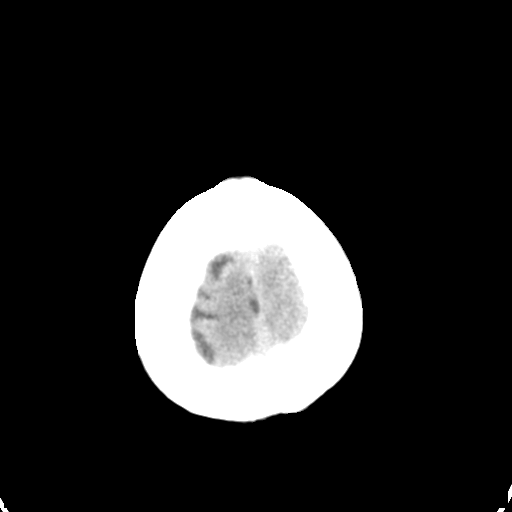

[Series 5: head without cor · coronal · non-contrast · 0.31mm/px · 3 of 79 slices shown]
[im 27/79  brain]
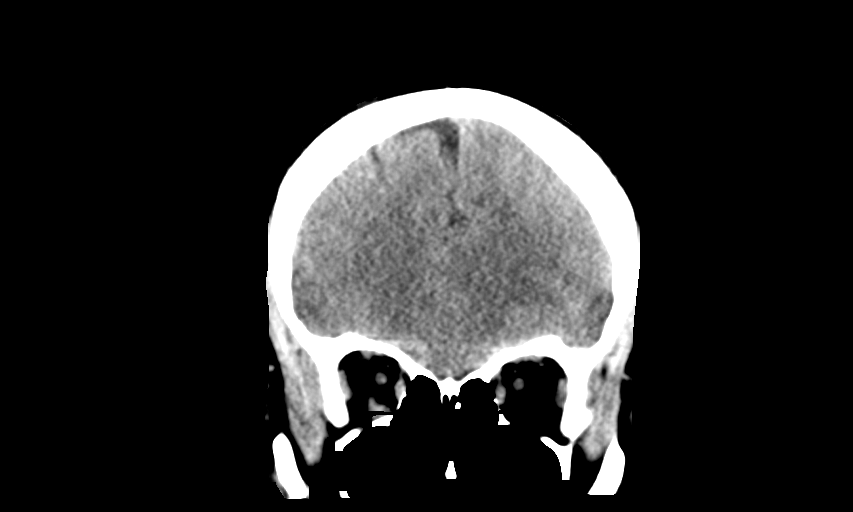
[im 35/79  brain]
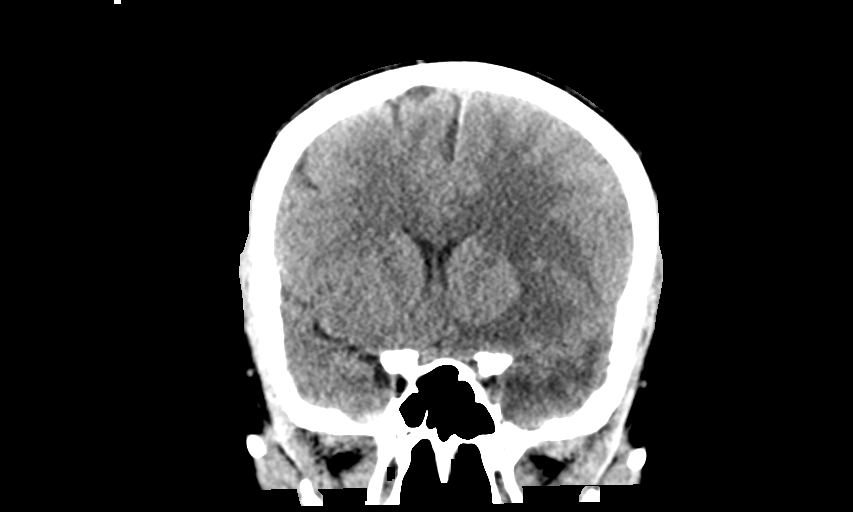
[im 44/79  brain]
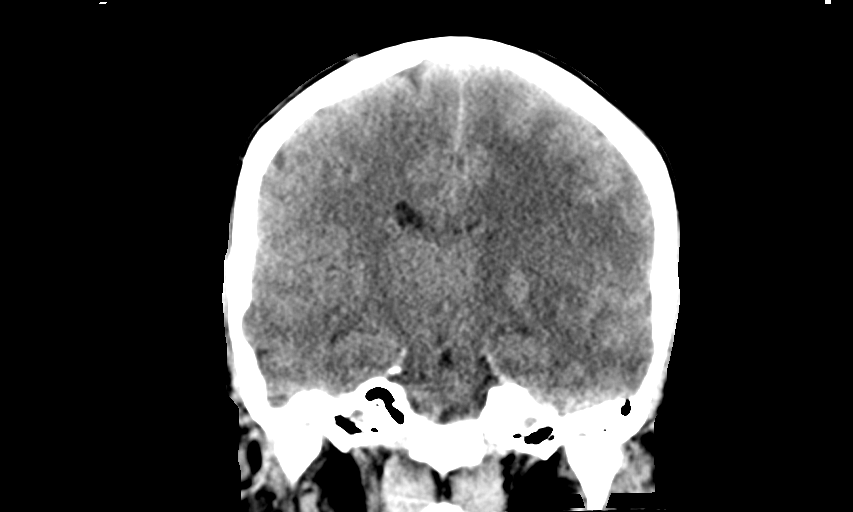

[Series 6: head without sag · sagittal · non-contrast · 0.31mm/px · 3 of 65 slices shown]
[im 22/65  brain]
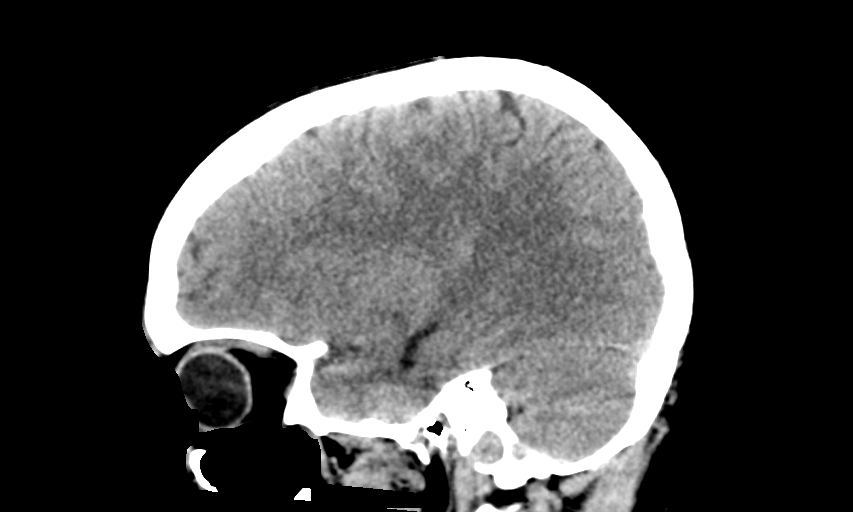
[im 33/65  brain]
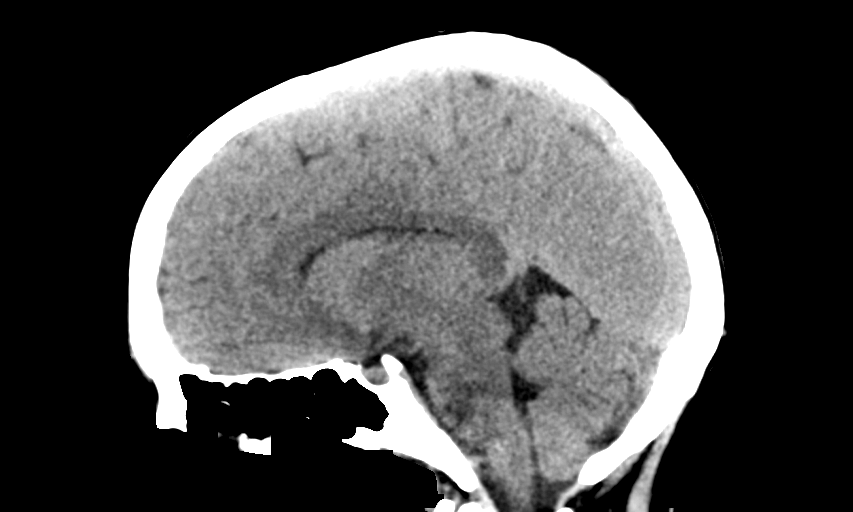
[im 43/65  brain]
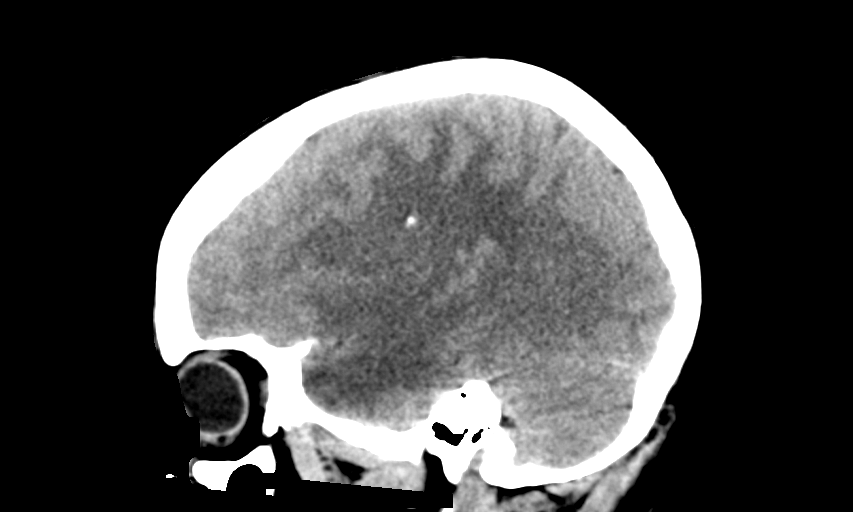

[13 of 47 positions shown; findings below may reference images not displayed]

FINDINGS: Brain: Edema/infiltrating tumor in the left cerebral hemisphere with
interval biopsy and minimal blood clot along the tract. No discrete
hematoma, extra-axial collection, or infarct. No hydrocephalus.
Midline shift is unchanged at 6 mm.

Vascular: No hyperdense vessel or unexpected calcification.

Skull: Tiny left parietal burr hole.

Sinuses/Orbits: Negative
IMPRESSION: No unexpected finding after brain mass biopsy. Midline shift
measures 6 mm.

## 2019-12-09 MED ORDER — POLYETHYLENE GLYCOL 3350 17 G PO PACK
17.0000 g | PACK | Freq: Once | ORAL | Status: AC
  Administered 2019-12-09: 17 g via ORAL
  Filled 2019-12-09: qty 1

## 2019-12-09 MED ORDER — LEVETIRACETAM 750 MG PO TABS: 750.0000 mg | ORAL_TABLET | Freq: Two times a day (BID) | ORAL | 0 refills | Status: DC

## 2019-12-09 MED ORDER — DEXAMETHASONE 2 MG PO TABS: ORAL_TABLET | ORAL | 0 refills | Status: AC

## 2019-12-09 MED ORDER — PANTOPRAZOLE SODIUM 40 MG PO TBEC: 40.0000 mg | DELAYED_RELEASE_TABLET | Freq: Every day | ORAL | 0 refills | Status: DC

## 2019-12-09 MED FILL — levETIRAcetam 750 MG TABS: 750 | 30 days supply | Qty: 60 | Fill #0

## 2019-12-09 MED FILL — PANTOPRAZOLE SOD DR 40 MG T: 40 | 10 days supply | Qty: 10 | Fill #0

## 2019-12-09 MED FILL — DEXAMETHASONE 2 MG TABLET: 2 | 10 days supply | Qty: 25 | Fill #0

## 2019-12-09 NOTE — Discharge Summary (Signed)
Physician Discharge Summary  Candace [name] HUD:[id number] DOB: Nov 14, 1980 DOA: 12/06/2019  PCP: Patient, No Pcp Per  Admit date: 12/06/2019 Discharge date: 12/09/2019  Time spent: 40 minutes  Recommendations for Outpatient Follow-up:  1. Follow outpatient CBC/CMP 2. 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 Follow with neurosurgery in 7-10 days 4. Follow with neuro oncology outpatient 5. 7x4 mm nodular opacity in superior segment of right lower lobe should be followed outpatient with repeat CT within 3 months - follow with neuro oncology whether additional w/u or sooner reimaging needed 6. If persistent speech issues, consider outpatient vs home health speech therapy - follow with neuro oncology outpatient regarding this 7. Seizure precautions  Discharge Diagnoses:  Principal Problem:   Brain mass Active Problems:   Focal seizure [facility])   Discharge Condition: stable  Diet recommendation: regular  Filed Weights   12/06/19 1841 12/07/19 0050  Weight: 60.8 kg 60.8 kg   History of present illness:  39 yo F with hx of migraine who presented after numbness of R upper and lower extremity which was followed by R facial twitching. She had episode of LOC lasting about 30 minutes after which the numbness resolved. She's had head CT with concern for vasogenic edema and underlying mass lesion with 7 mm L right midline shift. MRI brain with findings concerning for primary CNS neoplasm. She's been seen by neurology and neurosurgery. Neurology recommending 750 mg keppra BID. EEG was within normal limits. S/p stereotactic bx with neurosurgery on 9/9. CT CAP notable for 7x4 mm nodular opacity in superior segment of right lower lobe (needs follow up imaging in 3 months).  Path with high grade glial neoplasm, outpatient consultation pending.   See below for additional details  Hospital Course:  Concern for Primary CNS neoplasm  Possible  Focal Seizure with secondary generalization:  MRI with L frontoparietal and temporal masslike, confluent T2 hyperintense signal with ill defined patchy enhancement, concerning for primary neoplasm.   6 mm rightward midline shift with partial L lateral ventricle effacement.  CT chest/abdomen/pelvis with 7x4 mm nodular opacity in the superior segment of the RLL - recommending f/u CT chest in 3 months to assess of stability.  Also notable for findings suggestive of recent ovarian cyst rupture.  Neurosurgery c/s - s/p stereotactic bx 9/9 bx with high grade glial neoplasm, pending outpatient consultation  Neurology c/s, appreciate recs - continue keppra, seizure precautions Neurooncology consult pending - Dr. Mickeal [name] to see prior to d/c Head CT 9/10 without unexpected finding  Hx migraine Leukocytosis - resolved Anemia- mild, continue to monitor  Procedures: 9/10 Left stereotactic needle biopsy of brain lesion  EEG IMPRESSION: This study is within normal limits. No seizures or epileptiform discharges were seen throughout the recording.  Consultations:  Neurology  Neurosurgery  neurooncology  Discharge Exam: Vitals:   12/09/19 1000 12/09/19 1200  BP: 114/72   Pulse: 76 65  Resp: 18 18  Temp:  98.8 F (37.1 C)  SpO2: 97% 99%   Some speech difficulties today Otherwise, no other complaints  General: No acute distress. Cardiovascular: Heart sounds show Candace [name] regular rate, and rhythm. Lungs: Clear to auscultation bilaterally Abdomen: Soft, nontender, nondistended  Neurological: Alert and oriented 3. Moves all extremities 4 . Cranial nerves II through XII grossly intact. Skin: Warm and dry. No rashes or lesions. Extremities: No clubbing or cyanosis. No edema.    Discharge Instructions   Discharge Instructions    Call MD for:  difficulty breathing, headache  or visual disturbances   Complete by: As directed    Call MD for:  extreme fatigue   Complete by: As directed    Call  MD for:  hives   Complete by: As directed    Call MD for:  persistant dizziness or light-headedness   Complete by: As directed    Call MD for:  persistant nausea and vomiting   Complete by: As directed    Call MD for:  redness, tenderness, or signs of infection (pain, swelling, redness, odor or green/yellow discharge around incision site)   Complete by: As directed    Call MD for:  severe uncontrolled pain   Complete by: As directed    Call MD for:  temperature >100.4   Complete by: As directed    Diet - low sodium heart healthy   Complete by: As directed    Discharge instructions   Complete by: As directed    You were seen for Candace [name] brain lesion that we suspect is Candace [name] primary brain tumor.  Candace [name] has performed Candace [name] stereotactic biopsy of the lesion.  You'll need to follow up the biopsy results with Candace [name] and/or Dr. Mickeal [name] (the neuro oncologist).    You'll go home on Candace [name] steroid taper over 10 days.  We'll continue keppra for you 750 mg twice daily.  Please follow up with Dr. Mickeal [name] about these medications.   Since you had Candace [name] seizure, it's important to continue the keppra.  Follow the seizure precautions listed below.  You had Candace [name] lung nodule on the right side that should be followed up outpatient.  Please follow this up with Dr. Mickeal [name] to discuss follow up.  You will at least need repeat imaging within 3 months.  You were seen by speech therapy here in the hospital.  If you have continued issues, you may need to discuss outpatient or home health speech therapy with Dr. Mickeal [name].  Return for new, recurrent, or worsening symptoms.  Please ask your PCP to request records from this hospitalization so they know what was done and what the next steps will be.  Maintain seizure precautions  Per [facility] statutes, patients with seizures are not allowed to drive until they have been seizure-free for six months.   Use caution when using heavy equipment or power tools. Avoid working on  ladders or at heights. Take showers instead of baths. Ensure the water temperature is not too high on the home water heater. Do not go swimming alone. Do not lock yourself in Presley Gora room alone (i.e. bathroom). When caring for infants or small children, sit down when holding, feeding, or changing them to minimize risk of injury to the child in the event you have Taquisha Phung seizure. Maintain good sleep hygiene. Avoid alcohol.   If patienthas another seizure, call 911 and bring them back to the ED if: Kaitlyn Franko. The seizure lasts longer than 5 minutes.  B. The patient doesn't wake shortly after the seizure or has new problems such as difficulty seeing, speaking or moving following the seizure C. The patient was injured during the seizure D. The patient has Rakin Lemelle temperature over 102 F (39C) E. The patient vomited during the seizure and now is having trouble breathing   Discharge wound care:   Complete by: As directed    Per Neurosurgery   Increase activity slowly   Complete by: As directed      Allergies as of 12/09/2019      Reactions   Penicillins Rash,  Other (See Comments)   Pt does not recall so well      Medication List    TAKE these medications   dexamethasone 2 MG tablet Commonly known as: DECADRON Take 2 tablets (4 mg total) by mouth 3 (three) times daily for 2 days, THEN 1 tablet (2 mg total) 3 (three) times daily for 2 days, THEN 1 tablet (2 mg total) 2 (two) times daily for 2 days, THEN 0.5 tablets (1 mg total) 2 (two) times daily for 2 days, THEN 0.5 tablets (1 mg total) daily for 2 days. Start taking on: December 09, 2019   levETIRAcetam 750 MG tablet Commonly known as: Keppra Take 1 tablet (750 mg total) by mouth 2 (two) times daily.   MAGNESIUM PO Take 1 tablet by mouth daily.   NETTLE LEAF PO Take 1 tablet by mouth daily.   pantoprazole 40 MG tablet Commonly known as: PROTONIX Take 1 tablet (40 mg total) by mouth daily for 10 days. Start taking on: December 10, 2019    TURMERIC PO Take 1 tablet by mouth daily.   UNABLE TO FIND Take 1 Dose by mouth daily. Med Name: Mushroom Tincture; for immune support            Discharge Care Instructions  (From admission, onward)         Start     Ordered   12/09/19 0000  Discharge wound care:       Comments: Per Neurosurgery   12/09/19 1326         Allergies  Allergen Reactions  . Penicillins Rash and Other (See Comments)    Pt does not recall so well      The results of significant diagnostics from this hospitalization (including imaging, microbiology, ancillary and laboratory) are listed below for reference.    Significant Diagnostic Studies: EEG  Result Date: 12/07/2019 Lora Havens, MD     12/07/2019  9:13 AM Patient Name: Candace [name] MRN: [id number] Epilepsy Attending: Lora Havens Referring Physician/Provider: Dr. Gean Birchwood Date: 12/07/1019 Duration: 25.42 mins Patient history: 39 year old female who presented with Laurelai Lepp syncopal event with facial spasm.  EEG to evaluate for seizures. Level of alertness: Awake, asleep AEDs during EEG study: Keppra Technical aspects: This EEG study was done with scalp electrodes positioned according to the 10-20 International system of electrode placement. Electrical activity was acquired at Darius Lundberg sampling rate of 500Hz  and reviewed with Takiah Maiden high frequency filter of 70Hz  and Shatana Saxton low frequency filter of 1Hz . EEG data were recorded continuously and digitally stored. Description: The posterior dominant rhythm consists of 10 Hz activity of moderate voltage (25-35 uV) seen predominantly in posterior head regions, symmetric and reactive to eye opening and eye closing. Sleep was characterized by vertex waves, sleep spindles (12 to 14 Hz), maximal frontocentral region, POSTs. Physiologic photic driving was seen during photic stimulation.  Hyperventilation was not performed.   IMPRESSION: This study is within normal limits. No seizures or epileptiform discharges were seen  throughout the recording. Lora Havens   CT HEAD WO CONTRAST  Result Date: 12/09/2019 CLINICAL DATA:  Brain mass or lesion. EXAM: CT HEAD WITHOUT CONTRAST TECHNIQUE: Contiguous axial images were obtained from the base of the skull through the vertex without intravenous contrast. 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 No discrete hematoma, extra-axial collection, or infarct. No hydrocephalus. Midline shift is unchanged at  6 mm. Vascular: No hyperdense vessel or unexpected calcification. Skull: Tiny left parietal burr hole. Sinuses/Orbits: Negative IMPRESSION: No unexpected finding after brain mass biopsy. Midline shift measures 6 mm. Electronically Signed   By: Monte Fantasia M.D.   On: 12/09/2019 06:05   CT HEAD WO CONTRAST  Result Date: 12/07/2019 CLINICAL DATA:  Brain mass or lesion, brain lab protocol. EXAM: CT HEAD WITHOUT CONTRAST TECHNIQUE: Contiguous axial images were obtained from the base of the skull through the vertex without intravenous contrast. COMPARISON:  Brain MRI 12/07/2019, head CT 12/06/2019. FINDINGS: Brain: There is residual circulating contrast material. Again demonstrated is prominent predominantly vasogenic edema within the left cerebral hemisphere. This predominantly affects the left frontal and temporal lobes as well as left internal and external capsules as well as left insula. However, to Behr Cislo lesser degree, portions of the left parietal lobe are involved. These findings are unchanged. More conspicuous than on the prior head CT of 12/06/2019, there is Theresa Wedel hyperdense ovoid masslike region within the left frontal lobe centered along the gray-white junction (for instance as seen on series 4, image 19). This measures 3.4 x 2.7 cm in transaxial dimensions. Given the residual circulating contrast material, this is favored to reflect enhancement of an  underlying mass. Fatigue hemorrhage is difficult to definitively exclude. Unchanged mass effect with partial effacement of the left lateral ventricle and 7 mm rightward midline shift. No extra-axial fluid collection. Vascular: Circulating contrast material limits evaluation for hyperdense vessels Skull: Normal. Negative for fracture or focal lesion. Sinuses/Orbits: Visualized orbits show no acute finding. No significant paranasal sinus disease or mastoid effusion at the imaged levels. IMPRESSION: More conspicuous than on the prior head CT of 12/06/2019, there is Amayah Staheli 2.7 x 3.4 cm ovoid mass-like region of hyperdensity within the mid-to-posterior left frontal lobe centered along the gray-white junction. Given the presence of residual circulating contrast material, this is favored to reflect enhancement of an underlying mass. Petechial hemorrhage at this site is difficult to definitively exclude. Unchanged prominent surrounding edema within the left cerebral hemisphere. Unchanged mass effect with partial effacement of the left lateral ventricle and 7 mm rightward midline shift. Electronically Signed   By: Kellie Simmering DO   On: 12/07/2019 13:34   CT Head Wo Contrast  Result Date: 12/06/2019 CLINICAL DATA:  Syncope, slurred speech EXAM: CT HEAD WITHOUT CONTRAST TECHNIQUE: Contiguous axial images were obtained from the base of the skull through the vertex without intravenous contrast. COMPARISON:  None. FINDINGS: Brain: There is low-density/edema throughout the left frontal lobe and temporal lobe. Appearance is concerning for vasogenic edema possibly related to underlying mass lesion. Mass effect with 7 mm of left-to-right midline shift. No hydrocephalus. Vascular: No hyperdense vessel or unexpected calcification. Skull: No acute calvarial abnormality. Sinuses/Orbits: Visualized paranasal sinuses and mastoids clear. Orbital soft tissues unremarkable. Other: None IMPRESSION: Edema throughout the left frontal and temporal  lobes concerning for vasogenic edema and possible underlying mass lesion. Recommend further evaluation with MRI with and without contrast. 7 mm of left right midline shift. Critical Value/emergent results were called by telephone at the time of interpretation on 12/06/2019 at 7:43 pm to provider Fredia Sorrow , who verbally acknowledged these results. Electronically Signed   By: Rolm Baptise M.D.   On: 12/06/2019 19:46   MR BRAIN W WO CONTRAST  Result Date: 12/07/2019 CLINICAL DATA:  Brain mass or lesion. EXAM: MRI HEAD WITHOUT AND WITH CONTRAST TECHNIQUE: Multiplanar, multiecho pulse sequences of the brain and surrounding structures were obtained without  and with intravenous contrast. CONTRAST:  68mL GADAVIST GADOBUTROL 1 MMOL/ML IV SOLN COMPARISON:  12/06/2019 and 12/07/2019 head CTs. 12/07/2019 MRI head. FINDINGS: Brain: Redemonstration of mass-like confluent T2/FLAIR hyperintense signal involving the left frontoparietal and temporal regions. There is unchanged extension of abnormal signal into the insula, thalamus and basal ganglia. Associated ill-defined patchy enhancement is unchanged. No restricted diffusion or intracranial hemorrhage. Rightward midline shift of 6 mm and partial effacement of the left lateral ventricle, grossly unchanged. No extra-axial fluid collection. Vascular: Normal flow voids. Right frontal developmental venous anomaly. Skull and upper cervical spine: Normal marrow signal. Sinuses/Orbits: Normal orbits. Clear paranasal sinuses. No mastoid effusion. Other: None. 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 6 mm rightward midline shift with partial left lateral ventricle effacement, unchanged. Right frontal developmental venous anomaly. Electronically Signed   By: Primitivo Gauze M.D.   On: 12/07/2019 16:12   MR Brain W and Wo Contrast  Result Date:  12/07/2019 CLINICAL DATA:  Initial evaluation for acute seizure, possible mass lesion. EXAM: MRI HEAD WITHOUT AND WITH CONTRAST TECHNIQUE: Multiplanar, multiecho pulse sequences of the brain and surrounding structures were obtained without and with intravenous contrast. CONTRAST:  40mL GADAVIST GADOBUTROL 1 MMOL/ML IV SOLN COMPARISON:  Comparison made with prior CT from 12/06/2019. FINDINGS: Brain: Large confluent area of T2/FLAIR signal abnormality seen involving the left frontal and temporal lobes, centered at the left frontal operculum. Involvement of the left insula and anterior left temporal pole, as well as the anterior left hippocampus. Signal abnormality involves both the cortical gray matter as well as the subcortical and deep cerebral white matter. Signal abnormality extends along the left cortical spinal tract towards the left cerebral peduncle. No associated restricted diffusion or hemorrhage. Following contrast administration, patchy somewhat ill-defined parenchymal enhancement seen along the central and peripheral aspect of this lesion. Associated regional mass effect with partial effacement of the left lateral ventricle and 7 mm of left-to-right shift. No hydrocephalus or ventricular trapping. Basilar cisterns remain patent. No other focal lesions. No evidence for acute infarct. No other areas of encephalomalacia to suggest prior infarction or other insult. No evidence for acute intracranial hemorrhage. Few small foci of chronic hemosiderin staining noted associated with Jemeka Wagler DVA at the subcortical anterior right frontal lobe (series 20, image 37). No other mass lesion or abnormal enhancement. No extra-axial fluid collection. Pituitary gland and suprasellar region within normal limits. Vascular: Major intracranial vascular flow voids are maintained. Skull and upper cervical spine: Craniocervical junction within normal limits. Upper cervical spine normal. Bone marrow signal intensity within normal limits.  No focal marrow replacing lesion. No scalp soft tissue abnormality. Sinuses/Orbits: Globes and orbital soft tissues within normal limits. Paranasal sinuses and mastoid air cells are largely clear. Inner ear structures grossly normal. Other: None. IMPRESSION: 1. Large confluent area of T2/FLAIR signal abnormality involving the left frontal and temporal lobes with associated patchy post-contrast enhancement. Finding most concerning for Jakaylah Schlafer primary CNS neoplasm. Possible encephalitis/cerebritis would be the primary differential consideration, and could be considered in the correct clinical setting. 2. Associated regional mass effect with 7 mm of left-to-right shift. No hydrocephalus or ventricular trapping. 3. Incidental small DVA with associated chronic hemosiderin staining at the anterior right frontal lobe. Electronically Signed   By: Jeannine Boga M.D.   On: 12/07/2019 03:43   CT CHEST ABDOMEN PELVIS W CONTRAST  Result Date: 12/07/2019 CLINICAL DATA:  Brain mass EXAM: CT CHEST,  ABDOMEN, AND PELVIS WITH CONTRAST TECHNIQUE: Multidetector CT imaging of the chest, abdomen and pelvis was performed following the standard protocol during bolus administration of intravenous contrast. Oral contrast was administered for the CT abdomen and pelvis. CONTRAST:  116mL OMNIPAQUE IOHEXOL 300 MG/ML  SOLN COMPARISON:  Chest radiograph December 06, 2019 FINDINGS: CT CHEST FINDINGS Cardiovascular: There is no evident thoracic aortic aneurysm or dissection. Visualized great vessels appear normal. No major vessel pulmonary embolus demonstrable. No pericardial effusion or pericardial thickening. Mediastinum/Nodes: Thyroid normal in appearance. There is no appreciable thoracic adenopathy. No esophageal lesions are appreciable. Lungs/Pleura: Slight scarring is noted in the extreme apices bilaterally. On axial slice 413 series 5, there is Shirrell Solinger 7 x 4 mm nodular opacity abutting the pleura in the superior segment of the right lower lobe.  No other nodular appearing opacities evident. No appreciable edema or airspace opacity. No pleural effusions are evident. Musculoskeletal: No blastic or lytic bone lesions. No evident chest wall lesions. CT ABDOMEN PELVIS FINDINGS Hepatobiliary: There is hepatic steatosis. No focal liver lesions are appreciable. Gallbladder wall is not appreciably thickened. There is no biliary duct dilatation. Pancreas: There is no pancreatic mass or inflammatory focus. Spleen: No splenic lesions are evident. Adrenals/Urinary Tract: Adrenals bilaterally appear normal. Kidneys bilaterally show no evident mass or hydronephrosis on either side. There is no evident renal or ureteral calculus on either side. Urinary bladder is midline with wall thickness within normal limits. Stomach/Bowel: There is no appreciable bowel wall or mesenteric thickening. No evident bowel obstruction. The terminal ileum appears normal. There is no demonstrable free air or portal venous air. Vascular/Lymphatic: There is no abdominal aortic aneurysm. No arterial vascular lesions are evident. Major venous structures appear patent. There is no appreciable adenopathy in the abdomen or pelvis. Reproductive: Uterus is midline. There is evidence suggesting recent left ovarian cyst rupture with peripheral enhancement of Dmitriy Gair cystic mass measuring 2.4 x 2.2 cm. There is fluid tracking from this area into the cul-de-sac region. Jasani Lengel small amount of calcification is noted in the posterior cul-de-sac region. No other evidence of pelvic mass by CT. Other: Appendix appears normal. No abscess in the abdomen or pelvis. No ascites beyond the fluid in the cul-de-sac. Musculoskeletal: No blastic or lytic bone lesions. No intramuscular or abdominal wall lesions evident. IMPRESSION: Chest CT: 1. 7 x 4 mm nodular opacity abutting the pleura in the superior segment right lower lobe. Given this finding and the lesion intracranially, Verdie Wilms follow-up chest CT in 3 months to assess for stability  would be advisable. No other nodular appearing opacities evident. 2.  No evident adenopathy. CT abdomen and pelvis: 1. Findings suggesting recent ovarian cyst rupture with fluid in the left adnexa and cul-de-sac regions. Small amount of calcification in the cul-de-sac region at the mid sacral level likely represents residua of previous hemorrhage or inflammation. This finding does not have an appearance suggesting neoplastic focus. 2. Hepatic steatosis.  No focal liver lesions evident. 3. No adenopathy appreciable in the abdomen or pelvis. No findings suggesting neoplastic focus in the abdomen or pelvis. 4. No bowel obstruction. No abscess in the abdomen pelvis. No appendiceal region inflammation. Electronically Signed   By: Lowella Grip III M.D.   On: 12/07/2019 10:10   DG Chest Port 1 View  Result Date: 12/06/2019 CLINICAL DATA:  Migraine headache, syncope EXAM: PORTABLE CHEST 1 VIEW COMPARISON:  None. FINDINGS: The heart size and mediastinal contours are within normal limits. Both lungs are clear. The visualized skeletal structures are unremarkable.  IMPRESSION: No active disease. Electronically Signed   By: Randa Ngo M.D.   On: 12/06/2019 21:00    Microbiology: Recent Results (from the past 240 hour(s))  SARS Coronavirus 2 by RT PCR (hospital order, performed in [facility] hospital lab) Nasopharyngeal Nasopharyngeal Swab     Status: None   Collection Time: 12/06/19  8:43 PM   Specimen: Nasopharyngeal Swab  Result Value Ref Range Status   SARS Coronavirus 2 NEGATIVE NEGATIVE Final    Comment: (NOTE) SARS-CoV-2 target nucleic acids are NOT DETECTED.  The SARS-CoV-2 RNA is generally detectable in upper and lower respiratory specimens during the acute phase of infection. The lowest concentration of SARS-CoV-2 viral copies this assay can detect is 250 copies / mL. Ivoree Felmlee negative result does not preclude SARS-CoV-2 infection and should not be used as the sole basis for treatment or  other patient management decisions.  Kerra Guilfoil negative result may occur with improper specimen collection / handling, submission of specimen other than nasopharyngeal swab, presence of viral mutation(s) within the areas targeted by this assay, and inadequate number of viral copies (<250 copies / mL). Lilymarie Scroggins negative result must be combined with clinical observations, patient history, and epidemiological information.  Fact Sheet for Patients:   StrictlyIdeas.no  Fact Sheet for Healthcare Providers: BankingDealers.co.za  This test is not yet approved or  cleared by the [place] FDA and has been authorized for detection and/or diagnosis of SARS-CoV-2 by FDA under an Emergency Use Authorization (EUA).  This EUA will remain in effect (meaning this test can be used) for the duration of the COVID-19 declaration under Section 564(b)(1) of the Act, 21 U.S.C. section 360bbb-3(b)(1), unless the authorization is terminated or revoked sooner.  Performed at [facility], [address] [place], [place] [address]   MRSA PCR Screening     Status: None   Collection Time: 12/08/19  4:39 PM   Specimen: Nasal Mucosa; Nasopharyngeal  Result Value Ref Range Status   MRSA by PCR NEGATIVE NEGATIVE Final    Comment:        The GeneXpert MRSA Assay (FDA approved for NASAL specimens only), is one component of Charnika Herbst comprehensive MRSA colonization surveillance program. It is not intended to diagnose MRSA infection nor to guide or monitor treatment for MRSA infections. Performed at [facility], [address] [place], [place] [address]      Labs: Basic Metabolic Panel: Recent Labs  Lab 12/06/19 2043 12/07/19 0616 12/08/19 0346 12/09/19 0623  NA 137 138 139 138  K 3.6 3.9 3.7 4.0  CL 103 109 111 107  CO2 24 20* 22 21*  GLUCOSE 103* 90 82 121*  BUN 13 9 12 9   CREATININE 0.81 0.91 0.95 0.83  CALCIUM 9.2 8.3* 8.2* 9.3  MG  --   --  2.0  2.1  PHOS  --   --  3.3 3.7   Liver Function Tests: Recent Labs  Lab 12/06/19 2043 12/07/19 0616 12/08/19 0346 12/09/19 0623  AST 22 24 22 25   ALT 18 15 13 19   ALKPHOS 44 33* 31* 39  BILITOT 0.7 0.9 0.7 0.8  PROT 8.2* 6.0* 5.2* 7.2  ALBUMIN 5.1* 3.6 3.1* 4.1   No results for input(s): LIPASE, AMYLASE in the last 168 hours. No results for input(s): AMMONIA in the last 168 hours. CBC: Recent Labs  Lab 12/06/19 2043 12/07/19 0616 12/08/19 0346 12/09/19 0623  WBC 13.0* 7.1 6.1 10.9*  NEUTROABS 11.8* 4.9 3.1 10.0*  HGB  15.1* 12.5 11.8* 14.4  HCT 42.6 37.2 34.3* 41.9  MCV 86.6 88.4 89.1 88.2  PLT 295 260 232 268   Cardiac Enzymes: No results for input(s): CKTOTAL, CKMB, CKMBINDEX, TROPONINI in the last 168 hours. BNP: BNP (last 3 results) No results for input(s): BNP in the last 8760 hours.  ProBNP (last 3 results) No results for input(s): PROBNP in the last 8760 hours.  CBG: No results for input(s): GLUCAP in the last 168 hours.     Signed:  Fayrene Helper MD.  [facility] 12/09/2019, 1:27 PM

## 2019-12-09 NOTE — Progress Notes (Signed)
Subjective: Patient reports no headaches.  Mild word-finding difficulties  Objective: Vital signs in last 24 hours: Temp:  [96.9 F (36.1 C)-99 F (37.2 C)] 99 F (37.2 C) (09/10 0800) Pulse Rate:  [52-82] 78 (09/10 0800) Resp:  [10-23] 14 (09/10 0800) BP: (90-127)/(45-87) 117/78 (09/10 0800) SpO2:  [95 %-100 %] 99 % (09/10 0800)  Intake/Output from previous day: 09/09 0701 - 09/10 0700 In: 2644.2 [I.V.:2494.2; IV Piggyback:150] Out: 350 [Urine:340; Blood:10] Intake/Output this shift: No intake/output data recorded.  Awake, alert, Ox3 PERRL, VFC, no pronator drift.  Speech grossly fluent, no anomia, but slightly halting  Lab Results: Recent Labs    12/08/19 0346 12/09/19 0623  WBC 6.1 10.9*  HGB 11.8* 14.4  HCT 34.3* 41.9  PLT 232 268   BMET Recent Labs    12/08/19 0346 12/09/19 0623  NA 139 138  K 3.7 4.0  CL 111 107  CO2 22 21*  GLUCOSE 82 121*  BUN 12 9  CREATININE 0.95 0.83  CALCIUM 8.2* 9.3    Studies/Results: EEG  Result Date: 12/07/2019 Lora Havens, MD     12/07/2019  9:13 AM Patient Name: Candace [name] MRN: [id number] Epilepsy Attending: Lora Havens Referring Physician/Provider: Dr. Gean Birchwood Date: 12/07/1019 Duration: 25.42 mins Patient history: 39 year old female who presented with a syncopal event with facial spasm.  EEG to evaluate for seizures. Level of alertness: Awake, asleep AEDs during EEG study: Keppra Technical aspects: This EEG study was done with scalp electrodes positioned according to the 10-20 International system of electrode placement. Electrical activity was acquired at a sampling rate of 500Hz  and reviewed with a high frequency filter of 70Hz  and a low frequency filter of 1Hz . EEG data were recorded continuously and digitally stored. Description: The posterior dominant rhythm consists of 10 Hz activity of moderate voltage (25-35 uV) seen predominantly in posterior head regions, symmetric and reactive to eye opening and  eye closing. Sleep was characterized by vertex waves, sleep spindles (12 to 14 Hz), maximal frontocentral region, POSTs. Physiologic photic driving was seen during photic stimulation.  Hyperventilation was not performed.   IMPRESSION: This study is within normal limits. No seizures or epileptiform discharges were seen throughout the recording. Lora Havens   CT HEAD WO CONTRAST  Result Date: 12/09/2019 CLINICAL DATA:  Brain mass or lesion. EXAM: CT HEAD WITHOUT CONTRAST TECHNIQUE: Contiguous axial images were obtained from the base of the skull through the vertex without intravenous contrast. 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 No discrete hematoma, extra-axial collection, or infarct. No hydrocephalus. Midline shift is unchanged at 6 mm. Vascular: No hyperdense vessel or unexpected calcification. Skull: Tiny left parietal burr hole. Sinuses/Orbits: Negative IMPRESSION: No unexpected finding after brain mass biopsy. Midline shift measures 6 mm. Electronically Signed   By: Monte Fantasia M.D.   On: 12/09/2019 06:05   CT HEAD WO CONTRAST  Result Date: 12/07/2019 CLINICAL DATA:  Brain mass or lesion, brain lab protocol. EXAM: CT HEAD WITHOUT CONTRAST TECHNIQUE: Contiguous axial images were obtained from the base of the skull through the vertex without intravenous contrast. COMPARISON:  Brain MRI 12/07/2019, head CT 12/06/2019. FINDINGS: Brain: There is residual circulating contrast material. Again demonstrated is prominent predominantly vasogenic edema within the left cerebral hemisphere. This predominantly affects the left frontal and temporal lobes as well as left internal and external capsules as well as left insula.  However, to a lesser degree, portions of the left parietal lobe are involved. These findings are unchanged. More conspicuous than on the prior head CT of  12/06/2019, there is a hyperdense ovoid masslike region within the left frontal lobe centered along the gray-white junction (for instance as seen on series 4, image 19). This measures 3.4 x 2.7 cm in transaxial dimensions. Given the residual circulating contrast material, this is favored to reflect enhancement of an underlying mass. Fatigue hemorrhage is difficult to definitively exclude. Unchanged mass effect with partial effacement of the left lateral ventricle and 7 mm rightward midline shift. No extra-axial fluid collection. Vascular: Circulating contrast material limits evaluation for hyperdense vessels Skull: Normal. Negative for fracture or focal lesion. Sinuses/Orbits: Visualized orbits show no acute finding. No significant paranasal sinus disease or mastoid effusion at the imaged levels. IMPRESSION: More conspicuous than on the prior head CT of 12/06/2019, there is a 2.7 x 3.4 cm ovoid mass-like region of hyperdensity within the mid-to-posterior left frontal lobe centered along the gray-white junction. Given the presence of residual circulating contrast material, this is favored to reflect enhancement of an underlying mass. Petechial hemorrhage at this site is difficult to definitively exclude. Unchanged prominent surrounding edema within the left cerebral hemisphere. Unchanged mass effect with partial effacement of the left lateral ventricle and 7 mm rightward midline shift. Electronically Signed   By: Kellie Simmering DO   On: 12/07/2019 13:34   MR BRAIN W WO CONTRAST  Result Date: 12/07/2019 CLINICAL DATA:  Brain mass or lesion. EXAM: MRI HEAD WITHOUT AND WITH CONTRAST TECHNIQUE: Multiplanar, multiecho pulse sequences of the brain and surrounding structures were obtained without and with intravenous contrast. CONTRAST:  37mL GADAVIST GADOBUTROL 1 MMOL/ML IV SOLN COMPARISON:  12/06/2019 and 12/07/2019 head CTs. 12/07/2019 MRI head. FINDINGS: Brain: Redemonstration of mass-like confluent T2/FLAIR  hyperintense signal involving the left frontoparietal and temporal regions. There is unchanged extension of abnormal signal into the insula, thalamus and basal ganglia. Associated ill-defined patchy enhancement is unchanged. No restricted diffusion or intracranial hemorrhage. Rightward midline shift of 6 mm and partial effacement of the left lateral ventricle, grossly unchanged. No extra-axial fluid collection. Vascular: Normal flow voids. Right frontal developmental venous anomaly. Skull and upper cervical spine: Normal marrow signal. Sinuses/Orbits: Normal orbits. Clear paranasal sinuses. No mastoid effusion. Other: None. 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 6 mm rightward midline shift with partial left lateral ventricle effacement, unchanged. Right frontal developmental venous anomaly. Electronically Signed   By: Primitivo Gauze M.D.   On: 12/07/2019 16:12   CT CHEST ABDOMEN PELVIS W CONTRAST  Result Date: 12/07/2019 CLINICAL DATA:  Brain mass EXAM: CT CHEST, ABDOMEN, AND PELVIS WITH CONTRAST TECHNIQUE: Multidetector CT imaging of the chest, abdomen and pelvis was performed following the standard protocol during bolus administration of intravenous contrast. Oral contrast was administered for the CT abdomen and pelvis. CONTRAST:  155mL OMNIPAQUE IOHEXOL 300 MG/ML  SOLN COMPARISON:  Chest radiograph December 06, 2019 FINDINGS: CT CHEST FINDINGS Cardiovascular: There is no evident thoracic aortic aneurysm or dissection. Visualized great vessels appear normal. No major vessel pulmonary embolus demonstrable. No pericardial effusion or pericardial thickening. Mediastinum/Nodes: Thyroid normal in appearance. There is no appreciable thoracic adenopathy. No esophageal lesions are appreciable. Lungs/Pleura: Slight scarring is noted in the extreme apices bilaterally. On axial slice  017 series 5, there is a 7 x 4 mm nodular opacity abutting the pleura in  the superior segment of the right lower lobe. No other nodular appearing opacities evident. No appreciable edema or airspace opacity. No pleural effusions are evident. Musculoskeletal: No blastic or lytic bone lesions. No evident chest wall lesions. CT ABDOMEN PELVIS FINDINGS Hepatobiliary: There is hepatic steatosis. No focal liver lesions are appreciable. Gallbladder wall is not appreciably thickened. There is no biliary duct dilatation. Pancreas: There is no pancreatic mass or inflammatory focus. Spleen: No splenic lesions are evident. Adrenals/Urinary Tract: Adrenals bilaterally appear normal. Kidneys bilaterally show no evident mass or hydronephrosis on either side. There is no evident renal or ureteral calculus on either side. Urinary bladder is midline with wall thickness within normal limits. Stomach/Bowel: There is no appreciable bowel wall or mesenteric thickening. No evident bowel obstruction. The terminal ileum appears normal. There is no demonstrable free air or portal venous air. Vascular/Lymphatic: There is no abdominal aortic aneurysm. No arterial vascular lesions are evident. Major venous structures appear patent. There is no appreciable adenopathy in the abdomen or pelvis. Reproductive: Uterus is midline. There is evidence suggesting recent left ovarian cyst rupture with peripheral enhancement of a cystic mass measuring 2.4 x 2.2 cm. There is fluid tracking from this area into the cul-de-sac region. A small amount of calcification is noted in the posterior cul-de-sac region. No other evidence of pelvic mass by CT. Other: Appendix appears normal. No abscess in the abdomen or pelvis. No ascites beyond the fluid in the cul-de-sac. Musculoskeletal: No blastic or lytic bone lesions. No intramuscular or abdominal wall lesions evident. IMPRESSION: Chest CT: 1. 7 x 4 mm nodular opacity abutting the pleura in the superior segment right  lower lobe. Given this finding and the lesion intracranially, a follow-up chest CT in 3 months to assess for stability would be advisable. No other nodular appearing opacities evident. 2.  No evident adenopathy. CT abdomen and pelvis: 1. Findings suggesting recent ovarian cyst rupture with fluid in the left adnexa and cul-de-sac regions. Small amount of calcification in the cul-de-sac region at the mid sacral level likely represents residua of previous hemorrhage or inflammation. This finding does not have an appearance suggesting neoplastic focus. 2. Hepatic steatosis.  No focal liver lesions evident. 3. No adenopathy appreciable in the abdomen or pelvis. No findings suggesting neoplastic focus in the abdomen or pelvis. 4. No bowel obstruction. No abscess in the abdomen pelvis. No appendiceal region inflammation. Electronically Signed   By: Lowella Grip III M.D.   On: 12/07/2019 10:10    Assessment/Plan: S/p stereotactic biopsy of brain lesion, doing well POD#1.  CT head shows expected postoperative changes  - awaiting path - dexamethasone taper over 10 days - speech pathology to see - Keppra per neurlogy - can be discharged today with f/u in 7-10 days - can see Dr. Mickeal Skinner with neuro-oncology as outpatient  Vallarie Mare 12/09/2019, 8:54 AM

## 2019-12-09 NOTE — Consult Note (Signed)
[facility] Consult Note  Patient Care Team: Patient, No Pcp Per as PCP - General (General Practice)  CHIEF COMPLAINTS/PURPOSE OF CONSULTATION:  Seizure Brain Mass  HISTORY OF PRESENTING ILLNESS:  Candace [name] 39 y.o. female presented to medical attention with first ever seizure.  Event was described as "sudden onset left arm numbness" followed by "face twisting, and loss of consciousness for at least 30 minutes".  CNS imaging demonstrated a large left frontal non enhancing mass consistent with primary brain tumor.  She underwent biopsy on 12/08/19 with Dr. Marcello Moores.  Following surgery she has some slurring of speech but no other significant complaints.  Felt very drowsy before surgery on higher dose of Keppra but is better on 750mg  dose level.    MEDICAL HISTORY:  Past Medical History:  Diagnosis Date  . Headache     SURGICAL HISTORY: Past Surgical History:  Procedure Laterality Date  . ABDOMINAL SURGERY    . APPLICATION OF CRANIAL NAVIGATION Left 12/08/2019   Procedure: APPLICATION OF CRANIAL NAVIGATION;  Surgeon: Vallarie Mare, MD;  Location: [place];  Service: Neurosurgery;  Laterality: Left;  APPLICATION OF CRANIAL NAVIGATION  . FRAMELESS  BIOPSY WITH BRAINLAB Left 12/08/2019   Procedure: Left stereotactic brain biopsy with brainlab;  Surgeon: Vallarie Mare, MD;  Location: [place];  Service: Neurosurgery;  Laterality: Left;  Left stereotactic brain biopsy with brainlab  . WISDOM TOOTH EXTRACTION      SOCIAL HISTORY: Social History   Socioeconomic History  . Marital status: Married    Spouse name: Not on file  . Number of children: Not on file  . Years of education: Not on file  . Highest education level: Not on file  Occupational History  . Not on file  Tobacco Use  . Smoking status: Never Smoker  . Smokeless tobacco: Never Used  Substance and Sexual Activity  . Alcohol use: Not Currently  . Drug use: Never  . Sexual activity: Not on  file  Other Topics Concern  . Not on file  Social History Narrative  . Not on file   Social Determinants of Health   Financial Resource Strain:   . Difficulty of Paying Living Expenses: Not on file  Food Insecurity:   . Worried About Charity fundraiser in the Last Year: Not on file  . Ran Out of Food in the Last Year: Not on file  Transportation Needs:   . Lack of Transportation (Medical): Not on file  . Lack of Transportation (Non-Medical): Not on file  Physical Activity:   . Days of Exercise per Week: Not on file  . Minutes of Exercise per Session: Not on file  Stress:   . Feeling of Stress : Not on file  Social Connections:   . Frequency of Communication with Friends and Family: Not on file  . Frequency of Social Gatherings with Friends and Family: Not on file  . Attends Religious Services: Not on file  . Active Member of Clubs or Organizations: Not on file  . Attends Archivist Meetings: Not on file  . Marital Status: Not on file  Intimate Partner Violence:   . Fear of Current or Ex-Partner: Not on file  . Emotionally Abused: Not on file  . Physically Abused: Not on file  . Sexually Abused: Not on file    FAMILY HISTORY: Family History  Problem Relation Age of Onset  . Migraines Mother     ALLERGIES:  is allergic to penicillins.  MEDICATIONS:  Current Facility-Administered Medications  Medication Dose Route Frequency Provider Last Rate Last Admin  . 0.9 %  sodium chloride infusion   Intravenous Continuous Vallarie Mare, MD   Stopped at 12/09/19 0533  . acetaminophen (TYLENOL) tablet 650 mg  650 mg Oral Q4H PRN Vallarie Mare, MD       Or  . acetaminophen (TYLENOL) suppository 650 mg  650 mg Rectal Q4H PRN Vallarie Mare, MD      . Chlorhexidine Gluconate Cloth 2 % PADS 6 each  6 each Topical Daily Elodia Florence., MD      . dexamethasone (DECADRON) tablet 4 mg  4 mg Oral Q8H Vallarie Mare, MD   4 mg at 12/09/19 1335    Followed by  . [START ON 12/10/2019] dexamethasone (DECADRON) tablet 2 mg  2 mg Oral Q8H Vallarie Mare, MD       Followed by  . [START ON 12/12/2019] dexamethasone (DECADRON) tablet 2 mg  2 mg Oral Q12H Vallarie Mare, MD       Followed by  . [START ON 12/14/2019] dexamethasone (DECADRON) tablet 1 mg  1 mg Oral Q12H Vallarie Mare, MD       Followed by  . [START ON 12/16/2019] dexamethasone (DECADRON) tablet 1 mg  1 mg Oral Daily Vallarie Mare, MD      . docusate sodium (COLACE) capsule 100 mg  100 mg Oral BID Vallarie Mare, MD   100 mg at 12/09/19 1004  . HYDROcodone-acetaminophen (NORCO/VICODIN) 5-325 MG per tablet 1 tablet  1 tablet Oral Q4H PRN Vallarie Mare, MD   1 tablet at 12/08/19 2229  . labetalol (NORMODYNE) injection 10-40 mg  10-40 mg Intravenous Q10 min PRN Vallarie Mare, MD      . levETIRAcetam (KEPPRA) tablet 1,000 mg  1,000 mg Oral BID Vallarie Mare, MD   1,000 mg at 12/09/19 1004  . morphine 2 MG/ML injection 1-2 mg  1-2 mg Intravenous Q2H PRN Vallarie Mare, MD      . ondansetron [facility]) tablet 4 mg  4 mg Oral Q4H PRN Vallarie Mare, MD       Or  . ondansetron [facility]) injection 4 mg  4 mg Intravenous Q4H PRN Vallarie Mare, MD      . pantoprazole (PROTONIX) EC tablet 40 mg  40 mg Oral Daily Vallarie Mare, MD   40 mg at 12/09/19 1004  . polyethylene glycol (MIRALAX / GLYCOLAX) packet 17 g  17 g Oral Daily PRN Vallarie Mare, MD      . promethazine (PHENERGAN) tablet 12.5-25 mg  12.5-25 mg Oral Q4H PRN Vallarie Mare, MD      . sodium phosphate (FLEET) 7-19 GM/118ML enema 1 enema  1 enema Rectal Once PRN Vallarie Mare, MD        REVIEW OF SYSTEMS:   Constitutional: Denies fevers, chills or abnormal weight loss Eyes: Denies blurriness of vision Ears, nose, mouth, throat, and face: Denies mucositis or sore throat Respiratory: Denies cough, dyspnea or wheezes Cardiovascular: Denies palpitation, chest discomfort  or lower extremity swelling Gastrointestinal:  Denies nausea, constipation, diarrhea GU: Denies dysuria or incontinence Skin: Denies abnormal skin rashes Neurological: Per HPI Musculoskeletal: Denies joint pain, back or neck discomfort. No decrease in ROM Behavioral/Psych: Denies anxiety, disturbance in thought content, and mood instability   PHYSICAL EXAMINATION: Vitals:   12/09/19 1330 12/09/19 1345  BP:    Pulse:  70 80  Resp: (!) 23 18  Temp:    SpO2: 100% 100%   KPS: 90. General: Alert, cooperative, pleasant, in no acute distress Head: Craniotomy scar noted, dry and intact. EENT: No conjunctival injection or scleral icterus. Oral mucosa moist Lungs: Resp effort normal Cardiac: Regular rate and rhythm Abdomen: Soft, non-distended abdomen Skin: No rashes cyanosis or petechiae. Extremities: No clubbing or edema  NEUROLOGIC EXAM: Mental Status: Awake, alert, attentive to examiner. Oriented to self and environment. Language is fluent with intact comprehension.  Cranial Nerves: Visual acuity is grossly normal. Visual fields are full. Extra-ocular movements intact. No ptosis. Face is symmetric, tongue midline.  Modestl dysarthria Motor: Tone and bulk are normal. Power is full in both arms and legs. Reflexes are symmetric, no pathologic reflexes present. Intact finger to nose bilaterally Sensory: Intact to light touch and temperature Gait: Deferred   LABORATORY DATA:  I have reviewed the data as listed Lab Results  Component Value Date   WBC 10.9 (H) 12/09/2019   HGB 14.4 12/09/2019   HCT 41.9 12/09/2019   MCV 88.2 12/09/2019   PLT 268 12/09/2019   Recent Labs    12/07/19 0616 12/08/19 0346 12/09/19 0623  NA 138 139 138  K 3.9 3.7 4.0  CL 109 111 107  CO2 20* 22 21*  GLUCOSE 90 82 121*  BUN 9 12 9   CREATININE 0.91 0.95 0.83  CALCIUM 8.3* 8.2* 9.3  GFRNONAA >60 >60 >60  GFRAA >60 >60 >60  PROT 6.0* 5.2* 7.2  ALBUMIN 3.6 3.1* 4.1  AST 24 22 25   ALT 15 13 19    ALKPHOS 33* 31* 39  BILITOT 0.9 0.7 0.8    RADIOGRAPHIC STUDIES: I have personally reviewed the radiological images as listed and agreed with the findings in the report. EEG  Result Date: 12/07/2019 Lora Havens, MD     12/07/2019  9:13 AM Patient Name: Copelyn Widmer MRN: [id number] Epilepsy Attending: Lora Havens Referring Physician/Provider: Dr. Gean Birchwood Date: 12/07/1019 Duration: 25.42 mins Patient history: 39 year old female who presented with a syncopal event with facial spasm.  EEG to evaluate for seizures. Level of alertness: Awake, asleep AEDs during EEG study: Keppra Technical aspects: This EEG study was done with scalp electrodes positioned according to the 10-20 International system of electrode placement. Electrical activity was acquired at a sampling rate of 500Hz  and reviewed with a high frequency filter of 70Hz  and a low frequency filter of 1Hz . EEG data were recorded continuously and digitally stored. Description: The posterior dominant rhythm consists of 10 Hz activity of moderate voltage (25-35 uV) seen predominantly in posterior head regions, symmetric and reactive to eye opening and eye closing. Sleep was characterized by vertex waves, sleep spindles (12 to 14 Hz), maximal frontocentral region, POSTs. Physiologic photic driving was seen during photic stimulation.  Hyperventilation was not performed.   IMPRESSION: This study is within normal limits. No seizures or epileptiform discharges were seen throughout the recording. Lora Havens   CT HEAD WO CONTRAST  Result Date: 12/09/2019 CLINICAL DATA:  Brain mass or lesion. EXAM: CT HEAD WITHOUT CONTRAST TECHNIQUE: Contiguous axial images were obtained from the base of the skull through the vertex without intravenous contrast. 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 No discrete hematoma,  extra-axial collection, or infarct. No hydrocephalus. Midline shift is unchanged at 6 mm. Vascular: No hyperdense vessel or  unexpected calcification. Skull: Tiny left parietal burr hole. Sinuses/Orbits: Negative IMPRESSION: No unexpected finding after brain mass biopsy. Midline shift measures 6 mm. Electronically Signed   By: Monte Fantasia M.D.   On: 12/09/2019 06:05   CT HEAD WO CONTRAST  Result Date: 12/07/2019 CLINICAL DATA:  Brain mass or lesion, brain lab protocol. EXAM: CT HEAD WITHOUT CONTRAST TECHNIQUE: Contiguous axial images were obtained from the base of the skull through the vertex without intravenous contrast. COMPARISON:  Brain MRI 12/07/2019, head CT 12/06/2019. FINDINGS: Brain: There is residual circulating contrast material. Again demonstrated is prominent predominantly vasogenic edema within the left cerebral hemisphere. This predominantly affects the left frontal and temporal lobes as well as left internal and external capsules as well as left insula. However, to a lesser degree, portions of the left parietal lobe are involved. These findings are unchanged. More conspicuous than on the prior head CT of 12/06/2019, there is a hyperdense ovoid masslike region within the left frontal lobe centered along the gray-white junction (for instance as seen on series 4, image 19). This measures 3.4 x 2.7 cm in transaxial dimensions. Given the residual circulating contrast material, this is favored to reflect enhancement of an underlying mass. Fatigue hemorrhage is difficult to definitively exclude. Unchanged mass effect with partial effacement of the left lateral ventricle and 7 mm rightward midline shift. No extra-axial fluid collection. Vascular: Circulating contrast material limits evaluation for hyperdense vessels Skull: Normal. Negative for fracture or focal lesion. Sinuses/Orbits: Visualized orbits show no acute finding. No significant paranasal sinus disease or mastoid effusion at the imaged  levels. IMPRESSION: More conspicuous than on the prior head CT of 12/06/2019, there is a 2.7 x 3.4 cm ovoid mass-like region of hyperdensity within the mid-to-posterior left frontal lobe centered along the gray-white junction. Given the presence of residual circulating contrast material, this is favored to reflect enhancement of an underlying mass. Petechial hemorrhage at this site is difficult to definitively exclude. Unchanged prominent surrounding edema within the left cerebral hemisphere. Unchanged mass effect with partial effacement of the left lateral ventricle and 7 mm rightward midline shift. Electronically Signed   By: Kellie Simmering DO   On: 12/07/2019 13:34   CT Head Wo Contrast  Result Date: 12/06/2019 CLINICAL DATA:  Syncope, slurred speech EXAM: CT HEAD WITHOUT CONTRAST TECHNIQUE: Contiguous axial images were obtained from the base of the skull through the vertex without intravenous contrast. COMPARISON:  None. FINDINGS: Brain: There is low-density/edema throughout the left frontal lobe and temporal lobe. Appearance is concerning for vasogenic edema possibly related to underlying mass lesion. Mass effect with 7 mm of left-to-right midline shift. No hydrocephalus. Vascular: No hyperdense vessel or unexpected calcification. Skull: No acute calvarial abnormality. Sinuses/Orbits: Visualized paranasal sinuses and mastoids clear. Orbital soft tissues unremarkable. Other: None IMPRESSION: Edema throughout the left frontal and temporal lobes concerning for vasogenic edema and possible underlying mass lesion. Recommend further evaluation with MRI with and without contrast. 7 mm of left right midline shift. Critical Value/emergent results were called by telephone at the time of interpretation on 12/06/2019 at 7:43 pm to provider Fredia Sorrow , who verbally acknowledged these results. Electronically Signed   By: Rolm Baptise M.D.   On: 12/06/2019 19:46   MR BRAIN W WO CONTRAST  Result Date:  12/07/2019 CLINICAL DATA:  Brain mass or lesion. EXAM: MRI HEAD WITHOUT AND WITH CONTRAST TECHNIQUE: Multiplanar, multiecho pulse sequences of the brain and surrounding structures were obtained without and with intravenous contrast. CONTRAST:  64mL  GADAVIST GADOBUTROL 1 MMOL/ML IV SOLN COMPARISON:  12/06/2019 and 12/07/2019 head CTs. 12/07/2019 MRI head. FINDINGS: Brain: Redemonstration of mass-like confluent T2/FLAIR hyperintense signal involving the left frontoparietal and temporal regions. There is unchanged extension of abnormal signal into the insula, thalamus and basal ganglia. Associated ill-defined patchy enhancement is unchanged. No restricted diffusion or intracranial hemorrhage. Rightward midline shift of 6 mm and partial effacement of the left lateral ventricle, grossly unchanged. No extra-axial fluid collection. Vascular: Normal flow voids. Right frontal developmental venous anomaly. Skull and upper cervical spine: Normal marrow signal. Sinuses/Orbits: Normal orbits. Clear paranasal sinuses. No mastoid effusion. Other: None. 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 6 mm rightward midline shift with partial left lateral ventricle effacement, unchanged. Right frontal developmental venous anomaly. Electronically Signed   By: Primitivo Gauze M.D.   On: 12/07/2019 16:12   MR Brain W and Wo Contrast  Result Date: 12/07/2019 CLINICAL DATA:  Initial evaluation for acute seizure, possible mass lesion. EXAM: MRI HEAD WITHOUT AND WITH CONTRAST TECHNIQUE: Multiplanar, multiecho pulse sequences of the brain and surrounding structures were obtained without and with intravenous contrast. CONTRAST:  50mL GADAVIST GADOBUTROL 1 MMOL/ML IV SOLN COMPARISON:  Comparison made with prior CT from 12/06/2019. FINDINGS: Brain: Large confluent area of T2/FLAIR signal abnormality seen involving  the left frontal and temporal lobes, centered at the left frontal operculum. Involvement of the left insula and anterior left temporal pole, as well as the anterior left hippocampus. Signal abnormality involves both the cortical gray matter as well as the subcortical and deep cerebral white matter. Signal abnormality extends along the left cortical spinal tract towards the left cerebral peduncle. No associated restricted diffusion or hemorrhage. Following contrast administration, patchy somewhat ill-defined parenchymal enhancement seen along the central and peripheral aspect of this lesion. Associated regional mass effect with partial effacement of the left lateral ventricle and 7 mm of left-to-right shift. No hydrocephalus or ventricular trapping. Basilar cisterns remain patent. No other focal lesions. No evidence for acute infarct. No other areas of encephalomalacia to suggest prior infarction or other insult. No evidence for acute intracranial hemorrhage. Few small foci of chronic hemosiderin staining noted associated with a DVA at the subcortical anterior right frontal lobe (series 20, image 37). No other mass lesion or abnormal enhancement. No extra-axial fluid collection. Pituitary gland and suprasellar region within normal limits. Vascular: Major intracranial vascular flow voids are maintained. Skull and upper cervical spine: Craniocervical junction within normal limits. Upper cervical spine normal. Bone marrow signal intensity within normal limits. No focal marrow replacing lesion. No scalp soft tissue abnormality. Sinuses/Orbits: Globes and orbital soft tissues within normal limits. Paranasal sinuses and mastoid air cells are largely clear. Inner ear structures grossly normal. Other: None. IMPRESSION: 1. Large confluent area of T2/FLAIR signal abnormality involving the left frontal and temporal lobes with associated patchy post-contrast enhancement. Finding most concerning for a primary CNS neoplasm.  Possible encephalitis/cerebritis would be the primary differential consideration, and could be considered in the correct clinical setting. 2. Associated regional mass effect with 7 mm of left-to-right shift. No hydrocephalus or ventricular trapping. 3. Incidental small DVA with associated chronic hemosiderin staining at the anterior right frontal lobe. Electronically Signed   By: Jeannine Boga M.D.   On: 12/07/2019 03:43   CT CHEST ABDOMEN PELVIS W CONTRAST  Result Date: 12/07/2019 CLINICAL DATA:  Brain mass EXAM: CT CHEST, ABDOMEN, AND PELVIS WITH CONTRAST TECHNIQUE: Multidetector  CT imaging of the chest, abdomen and pelvis was performed following the standard protocol during bolus administration of intravenous contrast. Oral contrast was administered for the CT abdomen and pelvis. CONTRAST:  188mL OMNIPAQUE IOHEXOL 300 MG/ML  SOLN COMPARISON:  Chest radiograph December 06, 2019 FINDINGS: CT CHEST FINDINGS Cardiovascular: There is no evident thoracic aortic aneurysm or dissection. Visualized great vessels appear normal. No major vessel pulmonary embolus demonstrable. No pericardial effusion or pericardial thickening. Mediastinum/Nodes: Thyroid normal in appearance. There is no appreciable thoracic adenopathy. No esophageal lesions are appreciable. Lungs/Pleura: Slight scarring is noted in the extreme apices bilaterally. On axial slice 086 series 5, there is a 7 x 4 mm nodular opacity abutting the pleura in the superior segment of the right lower lobe. No other nodular appearing opacities evident. No appreciable edema or airspace opacity. No pleural effusions are evident. Musculoskeletal: No blastic or lytic bone lesions. No evident chest wall lesions. CT ABDOMEN PELVIS FINDINGS Hepatobiliary: There is hepatic steatosis. No focal liver lesions are appreciable. Gallbladder wall is not appreciably thickened. There is no biliary duct dilatation. Pancreas: There is no pancreatic mass or inflammatory focus.  Spleen: No splenic lesions are evident. Adrenals/Urinary Tract: Adrenals bilaterally appear normal. Kidneys bilaterally show no evident mass or hydronephrosis on either side. There is no evident renal or ureteral calculus on either side. Urinary bladder is midline with wall thickness within normal limits. Stomach/Bowel: There is no appreciable bowel wall or mesenteric thickening. No evident bowel obstruction. The terminal ileum appears normal. There is no demonstrable free air or portal venous air. Vascular/Lymphatic: There is no abdominal aortic aneurysm. No arterial vascular lesions are evident. Major venous structures appear patent. There is no appreciable adenopathy in the abdomen or pelvis. Reproductive: Uterus is midline. There is evidence suggesting recent left ovarian cyst rupture with peripheral enhancement of a cystic mass measuring 2.4 x 2.2 cm. There is fluid tracking from this area into the cul-de-sac region. A small amount of calcification is noted in the posterior cul-de-sac region. No other evidence of pelvic mass by CT. Other: Appendix appears normal. No abscess in the abdomen or pelvis. No ascites beyond the fluid in the cul-de-sac. Musculoskeletal: No blastic or lytic bone lesions. No intramuscular or abdominal wall lesions evident. IMPRESSION: Chest CT: 1. 7 x 4 mm nodular opacity abutting the pleura in the superior segment right lower lobe. Given this finding and the lesion intracranially, a follow-up chest CT in 3 months to assess for stability would be advisable. No other nodular appearing opacities evident. 2.  No evident adenopathy. CT abdomen and pelvis: 1. Findings suggesting recent ovarian cyst rupture with fluid in the left adnexa and cul-de-sac regions. Small amount of calcification in the cul-de-sac region at the mid sacral level likely represents residua of previous hemorrhage or inflammation. This finding does not have an appearance suggesting neoplastic focus. 2. Hepatic steatosis.   No focal liver lesions evident. 3. No adenopathy appreciable in the abdomen or pelvis. No findings suggesting neoplastic focus in the abdomen or pelvis. 4. No bowel obstruction. No abscess in the abdomen pelvis. No appendiceal region inflammation. Electronically Signed   By: Lowella Grip III M.D.   On: 12/07/2019 10:10   DG Chest Port 1 View  Result Date: 12/06/2019 CLINICAL DATA:  Migraine headache, syncope EXAM: PORTABLE CHEST 1 VIEW COMPARISON:  None. FINDINGS: The heart size and mediastinal contours are within normal limits. Both lungs are clear. The visualized skeletal structures are unremarkable. IMPRESSION: No active disease. Electronically Signed  By: Randa Ngo M.D.   On: 12/06/2019 21:00    ASSESSMENT & PLAN:  Left Frontal Mass Focal Seizures  Brizeyda Holtmeyer presents with clinical and radiographic syndrome consistent with left frontal primary brain neoplasm.  She is doing well following biopsy, lesion is within eloquent anatomic region and was not amenable to resection.  Preliminary pathology suggests indeterminate glioma, with no genetic markers.  Tissue will be sent to [facility] for further grading and characterization.   Had early discussions with patient and her husband regarding potential treatment pathways for the tumor, including radiation and chemotherapy.  Some of this will depend on finalizing pathology and tumor genetics.    We recommended she complete decadron taper and continue Keppra 750mg  BID.  We will arrange follow up in the brain tumor clinic for ~2 weeks from now.  We encouraged them to maintain plans to travel to [place] in 3 weeks for long planned 10 year anniversary trip.  All questions were answered. The patient knows to call the clinic with any problems, questions or concerns.  Our contact information was provided.  The total time spent in the encounter was 55 minutes and more than 50% was on counseling and review of test results     Ventura Sellers,  MD 12/09/2019 3:17 PM

## 2019-12-09 NOTE — Progress Notes (Signed)
Neuro intact, moving all extremities independently. Reviewed Discharge Summary with patient and her husband. Answered all questions ; patient wheeled down from unit

## 2019-12-09 NOTE — Progress Notes (Signed)
Patient experiencing blurry vision in left eye. This eye is usually blurry, but it is blurrier than normal this morning. Spoke with Dr Marcello Moores MD. He advised that patient has chronic corneal disease and should follow up with her opthalmologist.

## 2019-12-09 NOTE — Evaluation (Signed)
Speech Language Pathology Evaluation Patient Details Name: Candace [name] MRN: [id number] DOB: April 02, 1980 Today's Date: 12/09/2019 Time: 3704-8889 SLP Time Calculation (min) (ACUTE ONLY): 30 min  Problem List:  Patient Active Problem List   Diagnosis Date Noted  . Focal seizure ([place]) 12/07/2019  . Brain mass 12/07/2019   Past Medical History:  Past Medical History:  Diagnosis Date  . Headache    Past Surgical History:  Past Surgical History:  Procedure Laterality Date  . ABDOMINAL SURGERY    . APPLICATION OF CRANIAL NAVIGATION Left 12/08/2019   Procedure: APPLICATION OF CRANIAL NAVIGATION;  Surgeon: Vallarie Mare, MD;  Location: [facility];  Service: Neurosurgery;  Laterality: Left;  APPLICATION OF CRANIAL NAVIGATION  . FRAMELESS  BIOPSY WITH BRAINLAB Left 12/08/2019   Procedure: Left stereotactic brain biopsy with brainlab;  Surgeon: Vallarie Mare, MD;  Location: [facility];  Service: Neurosurgery;  Laterality: Left;  Left stereotactic brain biopsy with brainlab  . WISDOM TOOTH EXTRACTION     HPI:  39yo female admitted 12/06/19 with numbness of RUE, RLE and right facial twitching, as well as loss of consciousness x30 minutes. PMH: migraine. MRI = large Left frontoparietal and temporal masslike, confluent T2 hyperintense signal with ill-defined patchy enhancement is unchanged and concerning for primary neoplasm. CTHead = Edema/infiltrating tumor in the left cerebral hemisphere with interval biopsy and minimal blood clot along the tract. No discrete hematoma, extra-axial collection, or infarct. No hydrocephalus. Midline shift is unchanged at 79mm.    Assessment / Plan / Recommendation Clinical Impression  Pt seen at bedside for assessment of speech and language function due to mild word retrieval deficits and dysfluency. CN exam unremarkable. Pt's receptive and expressive language skills appear intact. She exhibits very mild dysarthria with occasional dysfluencies, but is able to utilize  strategy of decreased rate of speech to increase clarity of speech. Pt was also encouraged to overarticulate to maximize intelligibility and clarity. Reading tongue twisters aloud may facilitate articulatory precision. Hopefuly with continued decrease in swelling her speech issues will resolve, however, if needs arise, home health ST is recommended.    SLP Assessment  SLP Recommendation/Assessment: All further Speech Language Pathology needs can be addressed in the next venue of care  SLP Visit Diagnosis: Dysarthria and anarthria (R47.1)    Follow Up Recommendations  Home health SLP;Outpatient SLP (if needs arise)       SLP Evaluation Cognition  Overall Cognitive Status: Within Functional Limits for tasks assessed       Comprehension  Auditory Comprehension Overall Auditory Comprehension: Appears within functional limits for tasks assessed    Expression Expression Primary Mode of Expression: Verbal Verbal Expression Overall Verbal Expression: Appears within functional limits for tasks assessed Written Expression Dominant Hand: Right   Oral / Motor  Oral Motor/Sensory Function Overall Oral Motor/Sensory Function: Within functional limits Motor Speech Overall Motor Speech: Impaired Respiration: Within functional limits Resonance: Within functional limits Articulation: Impaired Level of Impairment: Conversation Intelligibility: Intelligible Motor Planning: Witnin functional limits Effective Techniques: Over-articulate;Slow rate   GO                   Candace [name] B. Quentin Ore, [facility], [place] Speech Language Pathologist Office: [phone]  Shonna Chock 12/09/2019, 1:09 PM

## 2019-12-09 NOTE — Progress Notes (Signed)
Neurology Progress Note   S:// Seen and examined Status posterior tactic biopsy Reports word finding difficulty/slow speech   O:// Current vital signs: BP 117/78   Pulse 78   Temp 99 F (37.2 C) (Axillary)   Resp 14   Ht 5\' 8"  (1.727 m)   Wt 60.8 kg   LMP 11/22/2019   SpO2 99%   BMI 20.37 kg/m  Vital signs in last 24 hours: Temp:  [96.9 F (36.1 C)-99 F (37.2 C)] 99 F (37.2 C) (09/10 0800) Pulse Rate:  [52-82] 78 (09/10 0800) Resp:  [10-23] 14 (09/10 0800) BP: (90-127)/(45-87) 117/78 (09/10 0800) SpO2:  [95 %-100 %] 99 % (09/10 0800) Awake alert oriented x3 Speech is not dysarthric She does have some halting and word finding difficulty and longer sentences but has no difficulty with naming comprehension repetition.  Fluency is mildly impaired. Able to follow all commands Cranial nerves II to XII intact Motor exam does not reveal any obvious weakness Sensory exam intact to light touch No dysmetria Gait testing was deferred   Medications  Current Facility-Administered Medications:  .  0.9 %  sodium chloride infusion, , Intravenous, Continuous, Vallarie Mare, MD, Stopped at 12/09/19 0533 .  acetaminophen (TYLENOL) tablet 650 mg, 650 mg, Oral, Q4H PRN **OR** acetaminophen (TYLENOL) suppository 650 mg, 650 mg, Rectal, Q4H PRN, Vallarie Mare, MD .  Chlorhexidine Gluconate Cloth 2 % PADS 6 each, 6 each, Topical, Daily, Elodia Florence., MD .  dexamethasone (DECADRON) tablet 4 mg, 4 mg, Oral, Q8H, 4 mg at 12/09/19 0609 **FOLLOWED BY** [START ON 12/10/2019] dexamethasone (DECADRON) tablet 2 mg, 2 mg, Oral, Q8H **FOLLOWED BY** [START ON 12/12/2019] dexamethasone (DECADRON) tablet 2 mg, 2 mg, Oral, Q12H **FOLLOWED BY** [START ON 12/14/2019] dexamethasone (DECADRON) tablet 1 mg, 1 mg, Oral, Q12H **FOLLOWED BY** [START ON 12/16/2019] dexamethasone (DECADRON) tablet 1 mg, 1 mg, Oral, Daily, Vallarie Mare, MD .  docusate sodium (COLACE) capsule 100 mg, 100 mg,  Oral, BID, Vallarie Mare, MD, 100 mg at 12/08/19 2230 .  HYDROcodone-acetaminophen (NORCO/VICODIN) 5-325 MG per tablet 1 tablet, 1 tablet, Oral, Q4H PRN, Vallarie Mare, MD, 1 tablet at 12/08/19 2229 .  labetalol (NORMODYNE) injection 10-40 mg, 10-40 mg, Intravenous, Q10 min PRN, Vallarie Mare, MD .  levETIRAcetam (KEPPRA) tablet 1,000 mg, 1,000 mg, Oral, BID, Vallarie Mare, MD, 1,000 mg at 12/08/19 2230 .  morphine 2 MG/ML injection 1-2 mg, 1-2 mg, Intravenous, Q2H PRN, Vallarie Mare, MD .  ondansetron [facility]) tablet 4 mg, 4 mg, Oral, Q4H PRN **OR** ondansetron (ZOFRAN) injection 4 mg, 4 mg, Intravenous, Q4H PRN, Vallarie Mare, MD .  pantoprazole (PROTONIX) EC tablet 40 mg, 40 mg, Oral, Daily, Vallarie Mare, MD, 40 mg at 12/08/19 1810 .  polyethylene glycol (MIRALAX / GLYCOLAX) packet 17 g, 17 g, Oral, Daily PRN, Vallarie Mare, MD .  promethazine (PHENERGAN) tablet 12.5-25 mg, 12.5-25 mg, Oral, Q4H PRN, Vallarie Mare, MD .  sodium phosphate (FLEET) 7-19 GM/118ML enema 1 enema, 1 enema, Rectal, Once PRN, Vallarie Mare, MD .  vancomycin (VANCOREADY) IVPB 750 mg/150 mL, 750 mg, Intravenous, Q12H, von Caren Griffins, RPH, Stopped at 12/09/19 0126 Labs CBC    Component Value Date/Time   WBC 10.9 (H) 12/09/2019 0623   RBC 4.75 12/09/2019 0623   HGB 14.4 12/09/2019 0623   HCT 41.9 12/09/2019 0623   PLT 268 12/09/2019 0623   MCV 88.2 12/09/2019 0623   [place]  30.3 12/09/2019 0623   MCHC 34.4 12/09/2019 0623   RDW 11.6 12/09/2019 0623   LYMPHSABS 0.6 (L) 12/09/2019 0623   MONOABS 0.3 12/09/2019 0623   EOSABS 0.0 12/09/2019 0623   BASOSABS 0.0 12/09/2019 0623    CMP     Component Value Date/Time   NA 138 12/09/2019 0623   K 4.0 12/09/2019 0623   CL 107 12/09/2019 0623   CO2 21 (L) 12/09/2019 0623   GLUCOSE 121 (H) 12/09/2019 0623   BUN 9 12/09/2019 0623   CREATININE 0.83 12/09/2019 0623   CALCIUM 9.3 12/09/2019 0623   PROT 7.2 12/09/2019  0623   ALBUMIN 4.1 12/09/2019 0623   AST 25 12/09/2019 0623   ALT 19 12/09/2019 0623   ALKPHOS 39 12/09/2019 0623   BILITOT 0.8 12/09/2019 0623   GFRNONAA >60 12/09/2019 0623   GFRAA >60 12/09/2019 7262   Imaging I have reviewed images in epic and the results pertinent to this consultation are: Postoperative CT head consistent with postoperative changes and no unexpected findings.  Assessment: 39 year old with no significant past medical history presenting with syncopal event with facial spasms preceding syncope concerning for focal seizure with secondary generalization. Noted to have a left frontotemporal lesion consistent with a primary brain neoplasm with surrounding vasogenic edema status post stereotactic biopsy. Was started on antiepileptics-reporting some sedation with Keppra.   Recommendations: Reduce Keppra from 1000 twice daily to 750 twice daily. Continue Keppra until seen by neurology-decisions to discontinue or taper will be in time based on clinical course and imaging along with other treatment plan for the tumor after the biopsy results become available Await biopsy result Follow-up with Dr. Cecil Cobbs, neuro oncology at [facility].  Is already aware of the patient. Dexamethasone per neurosurgery/oncology  Maintain seizure precautions  Per [facility] statutes, patients with seizures are not allowed to drive until they have been seizure-free for six months.   Use caution when using heavy equipment or power tools. Avoid working on ladders or at heights. Take showers instead of baths. Ensure the water temperature is not too high on the home water heater. Do not go swimming alone. Do not lock yourself in a room alone (i.e. bathroom). When caring for infants or small children, sit down when holding, feeding, or changing them to minimize risk of injury to the child in the event you have a seizure. Maintain good sleep hygiene. Avoid alcohol.   If patient  has another seizure, call 911 and bring them back to the ED if: A. The seizure lasts longer than 5 minutes.  B. The patient doesn't wake shortly after the seizure or has new problems such as difficulty seeing, speaking or moving following the seizure C. The patient was injured during the seizure D. The patient has a temperature over 102 F (39C) E. The patient vomited during the seizure and now is having trouble breathing    -- Amie Portland, MD [facility] Pager: [phone] If 7pm to 7am, please call on call as listed on AMION.

## 2019-12-13 ENCOUNTER — Encounter: Payer: Self-pay | Admitting: Physician Assistant

## 2019-12-14 ENCOUNTER — Telehealth: Payer: Self-pay

## 2019-12-14 NOTE — Telephone Encounter (Signed)
Called patient back and let her know that Per Dr Mickeal Skinner We need to wait on path, case will be discussed in tumor board Monday and we can determine follow up at that time. Patient verbalized understanding.

## 2019-12-15 ENCOUNTER — Other Ambulatory Visit: Payer: Self-pay | Admitting: *Deleted

## 2019-12-19 ENCOUNTER — Inpatient Hospital Stay: Payer: Self-pay

## 2019-12-20 LAB — SURGICAL PATHOLOGY

## 2019-12-22 ENCOUNTER — Encounter (HOSPITAL_COMMUNITY): Payer: Self-pay

## 2019-12-23 ENCOUNTER — Inpatient Hospital Stay: Payer: Self-pay | Attending: Internal Medicine | Admitting: Internal Medicine

## 2019-12-23 ENCOUNTER — Other Ambulatory Visit: Payer: Self-pay

## 2019-12-23 VITALS — BP 119/77 | HR 66 | Temp 97.7°F | Resp 18 | Ht 68.0 in | Wt 138.2 lb

## 2019-12-23 DIAGNOSIS — R609 Edema, unspecified: Secondary | ICD-10-CM | POA: Insufficient documentation

## 2019-12-23 DIAGNOSIS — R5383 Other fatigue: Secondary | ICD-10-CM | POA: Insufficient documentation

## 2019-12-23 DIAGNOSIS — K760 Fatty (change of) liver, not elsewhere classified: Secondary | ICD-10-CM | POA: Insufficient documentation

## 2019-12-23 DIAGNOSIS — Z79899 Other long term (current) drug therapy: Secondary | ICD-10-CM | POA: Insufficient documentation

## 2019-12-23 DIAGNOSIS — C719 Malignant neoplasm of brain, unspecified: Secondary | ICD-10-CM | POA: Insufficient documentation

## 2019-12-23 DIAGNOSIS — R4781 Slurred speech: Secondary | ICD-10-CM | POA: Insufficient documentation

## 2019-12-23 DIAGNOSIS — R233 Spontaneous ecchymoses: Secondary | ICD-10-CM | POA: Insufficient documentation

## 2019-12-23 DIAGNOSIS — R918 Other nonspecific abnormal finding of lung field: Secondary | ICD-10-CM | POA: Insufficient documentation

## 2019-12-23 DIAGNOSIS — G43909 Migraine, unspecified, not intractable, without status migrainosus: Secondary | ICD-10-CM | POA: Insufficient documentation

## 2019-12-23 DIAGNOSIS — R200 Anesthesia of skin: Secondary | ICD-10-CM | POA: Insufficient documentation

## 2019-12-23 DIAGNOSIS — Z8249 Family history of ischemic heart disease and other diseases of the circulatory system: Secondary | ICD-10-CM | POA: Insufficient documentation

## 2019-12-23 DIAGNOSIS — R55 Syncope and collapse: Secondary | ICD-10-CM | POA: Insufficient documentation

## 2019-12-23 DIAGNOSIS — R569 Unspecified convulsions: Secondary | ICD-10-CM | POA: Insufficient documentation

## 2019-12-23 DIAGNOSIS — J984 Other disorders of lung: Secondary | ICD-10-CM | POA: Insufficient documentation

## 2019-12-23 NOTE — Progress Notes (Signed)
[facility] [place], [place] [address] [phone]   New Patient Evaluation  Date of Service: 12/23/19 Patient Name: Candace [name] Patient MRN: [id number] Patient DOB: Dec 28, 1980 Provider: Ventura Sellers, MD  Identifying Statement:  Candace [name] is a 39 y.o. female with left temporal grade 3 astrocytoma who presents for initial consultation and evaluation.    Referring Provider: No referring provider defined for this encounter.  Oncologic History: Oncology History  Grade III astrocytoma ([place])  12/08/2019 Surgery   Stereotactic R temporal biopsy by Dr. Marcello Moores.  Path demonstrates Astrocytoma, IDH-mt, WHO grade 3     Biomarkers:  MGMT Unknown.  IDH 1/2 Mutated.  EGFR Unknown  TERT Unknown   History of Present Illness: The patient's records from the referring physician were obtained and reviewed and the patient interviewed to confirm this HPI.  Candace [name] presented to medical attention early in September with first ever seizure.  Event was described as "sudden onset left arm numbness" followed by "face twisting, and loss of consciousness for at least 30 minutes".  CNS imaging demonstrated a large left frontal non enhancing mass consistent with primary brain tumor.  She underwent biopsy on 12/08/19 with Dr. Marcello Moores.  Following surgery she has some slurring of speech but no other significant complaints, returned to prior baseline upon discharge.  Now home, she has had no breathrough events and no focal neurologic complaints.    Medications: Current Outpatient Medications on File Prior to Visit  Medication Sig Dispense Refill  . levETIRAcetam (KEPPRA) 750 MG tablet Take 1 tablet (750 mg total) by mouth 2 (two) times daily. 60 tablet 0  . MAGNESIUM PO Take 1 tablet by mouth daily.    . Nettle, Urtica Dioica, (NETTLE LEAF PO) Take 1 tablet by mouth daily.    . pantoprazole (PROTONIX) 40 MG tablet Take 1 tablet (40 mg  total) by mouth daily for 10 days. 10 tablet 0  . TURMERIC PO Take 1 tablet by mouth daily.    Marland Kitchen UNABLE TO FIND Take 1 Dose by mouth daily. Med Name: Mushroom Tincture; for immune support     No current facility-administered medications on file prior to visit.    Allergies:  Allergies  Allergen Reactions  . Penicillins Rash and Other (See Comments)    Pt does not recall so well   Past Medical History:  Past Medical History:  Diagnosis Date  . Headache    Past Surgical History:  Past Surgical History:  Procedure Laterality Date  . ABDOMINAL SURGERY    . APPLICATION OF CRANIAL NAVIGATION Left 12/08/2019   Procedure: APPLICATION OF CRANIAL NAVIGATION;  Surgeon: Vallarie Mare, MD;  Location: [facility];  Service: Neurosurgery;  Laterality: Left;  APPLICATION OF CRANIAL NAVIGATION  . FRAMELESS  BIOPSY WITH BRAINLAB Left 12/08/2019   Procedure: Left stereotactic brain biopsy with brainlab;  Surgeon: Vallarie Mare, MD;  Location: [facility];  Service: Neurosurgery;  Laterality: Left;  Left stereotactic brain biopsy with brainlab  . WISDOM TOOTH EXTRACTION     Social History:  Social History   Socioeconomic History  . Marital status: Married    Spouse name: Not on file  . Number of children: Not on file  . Years of education: Not on file  . Highest education level: Not on file  Occupational History  . Not on file  Tobacco Use  . Smoking status: Never Smoker  . Smokeless tobacco: Never Used  Substance and  Sexual Activity  . Alcohol use: Not Currently  . Drug use: Never  . Sexual activity: Not on file  Other Topics Concern  . Not on file  Social History Narrative  . Not on file   Social Determinants of Health   Financial Resource Strain:   . Difficulty of Paying Living Expenses: Not on file  Food Insecurity:   . Worried About Charity fundraiser in the Last Year: Not on file  . Ran Out of Food in the Last Year: Not on file  Transportation Needs:   . Lack of Transportation  (Medical): Not on file  . Lack of Transportation (Non-Medical): Not on file  Physical Activity:   . Days of Exercise per Week: Not on file  . Minutes of Exercise per Session: Not on file  Stress:   . Feeling of Stress : Not on file  Social Connections:   . Frequency of Communication with Friends and Family: Not on file  . Frequency of Social Gatherings with Friends and Family: Not on file  . Attends Religious Services: Not on file  . Active Member of Clubs or Organizations: Not on file  . Attends Archivist Meetings: Not on file  . Marital Status: Not on file  Intimate Partner Violence:   . Fear of Current or Ex-Partner: Not on file  . Emotionally Abused: Not on file  . Physically Abused: Not on file  . Sexually Abused: Not on file   Family History:  Family History  Problem Relation Age of Onset  . Migraines Mother     Review of Systems: Constitutional: Doesn't report fevers, chills or abnormal weight loss Eyes: Doesn't report blurriness of vision Ears, nose, mouth, throat, and face: Doesn't report sore throat Respiratory: Doesn't report cough, dyspnea or wheezes Cardiovascular: Doesn't report palpitation, chest discomfort  Gastrointestinal:  Doesn't report nausea, constipation, diarrhea GU: Doesn't report incontinence Skin: Doesn't report skin rashes Neurological: Per HPI Musculoskeletal: Doesn't report joint pain Behavioral/Psych: Doesn't report anxiety  Physical Exam: Vitals:   12/23/19 1048  BP: 119/77  Pulse: 66  Resp: 18  Temp: 97.7 F (36.5 C)  SpO2: 100%   KPS: 90. General: Alert, cooperative, pleasant, in no acute distress Head: Normal EENT: No conjunctival injection or scleral icterus.  Lungs: Resp effort normal Cardiac: Regular rate Abdomen: Non-distended abdomen Skin: No rashes cyanosis or petechiae. Extremities: No clubbing or edema  Neurologic Exam: Mental Status: Awake, alert, attentive to examiner. Oriented to self and environment.  Language is fluent with intact comprehension.  Cranial Nerves: Visual acuity is grossly normal. Visual fields are full. Extra-ocular movements intact. No ptosis. Face is symmetric Motor: Tone and bulk are normal. Power is full in both arms and legs. Reflexes are symmetric, no pathologic reflexes present.  Sensory: Intact to light touch Gait: Normal.   Labs: I have reviewed the data as listed    Component Value Date/Time   NA 138 12/09/2019 0623   K 4.0 12/09/2019 0623   CL 107 12/09/2019 0623   CO2 21 (L) 12/09/2019 0623   GLUCOSE 121 (H) 12/09/2019 0623   BUN 9 12/09/2019 0623   CREATININE 0.83 12/09/2019 0623   CALCIUM 9.3 12/09/2019 0623   PROT 7.2 12/09/2019 0623   ALBUMIN 4.1 12/09/2019 0623   AST 25 12/09/2019 0623   ALT 19 12/09/2019 0623   ALKPHOS 39 12/09/2019 0623   BILITOT 0.8 12/09/2019 0623   GFRNONAA >60 12/09/2019 0623   GFRAA >60 12/09/2019 0539   Lab  Results  Component Value Date   WBC 10.9 (H) 12/09/2019   NEUTROABS 10.0 (H) 12/09/2019   HGB 14.4 12/09/2019   HCT 41.9 12/09/2019   MCV 88.2 12/09/2019   PLT 268 12/09/2019    Imaging:  EEG  Result Date: 12/07/2019 Lora Havens, MD     12/07/2019  9:13 AM Patient Name: Kia Varnadore MRN: [id number] Epilepsy Attending: Lora Havens Referring Physician/Provider: Dr. Gean Birchwood Date: 12/07/1019 Duration: 25.42 mins Patient history: 39 year old female who presented with a syncopal event with facial spasm.  EEG to evaluate for seizures. Level of alertness: Awake, asleep AEDs during EEG study: Keppra Technical aspects: This EEG study was done with scalp electrodes positioned according to the 10-20 International system of electrode placement. Electrical activity was acquired at a sampling rate of 500Hz and reviewed with a high frequency filter of 70Hz and a low frequency filter of 1Hz. EEG data were recorded continuously and digitally stored. Description: The posterior dominant rhythm consists of 10 Hz  activity of moderate voltage (25-35 uV) seen predominantly in posterior head regions, symmetric and reactive to eye opening and eye closing. Sleep was characterized by vertex waves, sleep spindles (12 to 14 Hz), maximal frontocentral region, POSTs. Physiologic photic driving was seen during photic stimulation.  Hyperventilation was not performed.   IMPRESSION: This study is within normal limits. No seizures or epileptiform discharges were seen throughout the recording. Lora Havens   CT HEAD WO CONTRAST  Result Date: 12/09/2019 CLINICAL DATA:  Brain mass or lesion. EXAM: CT HEAD WITHOUT CONTRAST TECHNIQUE: Contiguous axial images were obtained from the base of the skull through the vertex without intravenous contrast. 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 No discrete hematoma, extra-axial collection, or infarct. No hydrocephalus. Midline shift is unchanged at 6 mm. Vascular: No hyperdense vessel or unexpected calcification. Skull: Tiny left parietal burr hole. Sinuses/Orbits: Negative IMPRESSION: No unexpected finding after brain mass biopsy. Midline shift measures 6 mm. Electronically Signed   By: Monte Fantasia M.D.   On: 12/09/2019 06:05   CT HEAD WO CONTRAST  Result Date: 12/07/2019 CLINICAL DATA:  Brain mass or lesion, brain lab protocol. EXAM: CT HEAD WITHOUT CONTRAST TECHNIQUE: Contiguous axial images were obtained from the base of the skull through the vertex without intravenous contrast. COMPARISON:  Brain MRI 12/07/2019, head CT 12/06/2019. FINDINGS: Brain: There is residual circulating contrast material. Again demonstrated is prominent predominantly vasogenic edema within the left cerebral hemisphere. This predominantly affects the left frontal and temporal lobes as well as left internal and external capsules as well as left insula. However, to a lesser degree,  portions of the left parietal lobe are involved. These findings are unchanged. More conspicuous than on the prior head CT of 12/06/2019, there is a hyperdense ovoid masslike region within the left frontal lobe centered along the gray-white junction (for instance as seen on series 4, image 19). This measures 3.4 x 2.7 cm in transaxial dimensions. Given the residual circulating contrast material, this is favored to reflect enhancement of an underlying mass. Fatigue hemorrhage is difficult to definitively exclude. Unchanged mass effect with partial effacement of the left lateral ventricle and 7 mm rightward midline shift. No extra-axial fluid collection. Vascular: Circulating contrast material limits evaluation for hyperdense vessels Skull: Normal. Negative for fracture or focal lesion. Sinuses/Orbits: Visualized orbits show no acute finding. No significant paranasal sinus disease or  mastoid effusion at the imaged levels. IMPRESSION: More conspicuous than on the prior head CT of 12/06/2019, there is a 2.7 x 3.4 cm ovoid mass-like region of hyperdensity within the mid-to-posterior left frontal lobe centered along the gray-white junction. Given the presence of residual circulating contrast material, this is favored to reflect enhancement of an underlying mass. Petechial hemorrhage at this site is difficult to definitively exclude. Unchanged prominent surrounding edema within the left cerebral hemisphere. Unchanged mass effect with partial effacement of the left lateral ventricle and 7 mm rightward midline shift. Electronically Signed   By: Kellie Simmering DO   On: 12/07/2019 13:34   CT Head Wo Contrast  Result Date: 12/06/2019 CLINICAL DATA:  Syncope, slurred speech EXAM: CT HEAD WITHOUT CONTRAST TECHNIQUE: Contiguous axial images were obtained from the base of the skull through the vertex without intravenous contrast. COMPARISON:  None. FINDINGS: Brain: There is low-density/edema throughout the left frontal lobe and  temporal lobe. Appearance is concerning for vasogenic edema possibly related to underlying mass lesion. Mass effect with 7 mm of left-to-right midline shift. No hydrocephalus. Vascular: No hyperdense vessel or unexpected calcification. Skull: No acute calvarial abnormality. Sinuses/Orbits: Visualized paranasal sinuses and mastoids clear. Orbital soft tissues unremarkable. Other: None IMPRESSION: Edema throughout the left frontal and temporal lobes concerning for vasogenic edema and possible underlying mass lesion. Recommend further evaluation with MRI with and without contrast. 7 mm of left right midline shift. Critical Value/emergent results were called by telephone at the time of interpretation on 12/06/2019 at 7:43 pm to provider Fredia Sorrow , who verbally acknowledged these results. Electronically Signed   By: Rolm Baptise M.D.   On: 12/06/2019 19:46   MR BRAIN W WO CONTRAST  Result Date: 12/07/2019 CLINICAL DATA:  Brain mass or lesion. EXAM: MRI HEAD WITHOUT AND WITH CONTRAST TECHNIQUE: Multiplanar, multiecho pulse sequences of the brain and surrounding structures were obtained without and with intravenous contrast. CONTRAST:  37m GADAVIST GADOBUTROL 1 MMOL/ML IV SOLN COMPARISON:  12/06/2019 and 12/07/2019 head CTs. 12/07/2019 MRI head. FINDINGS: Brain: Redemonstration of mass-like confluent T2/FLAIR hyperintense signal involving the left frontoparietal and temporal regions. There is unchanged extension of abnormal signal into the insula, thalamus and basal ganglia. Associated ill-defined patchy enhancement is unchanged. No restricted diffusion or intracranial hemorrhage. Rightward midline shift of 6 mm and partial effacement of the left lateral ventricle, grossly unchanged. No extra-axial fluid collection. Vascular: Normal flow voids. Right frontal developmental venous anomaly. Skull and upper cervical spine: Normal marrow signal. Sinuses/Orbits: Normal orbits. Clear paranasal sinuses. No mastoid  effusion. Other: None. 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 6 mm rightward midline shift with partial left lateral ventricle effacement, unchanged. Right frontal developmental venous anomaly. Electronically Signed   By: CPrimitivo GauzeM.D.   On: 12/07/2019 16:12   MR Brain W and Wo Contrast  Result Date: 12/07/2019 CLINICAL DATA:  Initial evaluation for acute seizure, possible mass lesion. EXAM: MRI HEAD WITHOUT AND WITH CONTRAST TECHNIQUE: Multiplanar, multiecho pulse sequences of the brain and surrounding structures were obtained without and with intravenous contrast. CONTRAST:  678mGADAVIST GADOBUTROL 1 MMOL/ML IV SOLN COMPARISON:  Comparison made with prior CT from 12/06/2019. FINDINGS: Brain: Large confluent area of T2/FLAIR signal abnormality seen involving the left frontal and temporal lobes, centered at the left frontal operculum. Involvement of the left insula and anterior left temporal pole, as well as the anterior left hippocampus. Signal abnormality  involves both the cortical gray matter as well as the subcortical and deep cerebral white matter. Signal abnormality extends along the left cortical spinal tract towards the left cerebral peduncle. No associated restricted diffusion or hemorrhage. Following contrast administration, patchy somewhat ill-defined parenchymal enhancement seen along the central and peripheral aspect of this lesion. Associated regional mass effect with partial effacement of the left lateral ventricle and 7 mm of left-to-right shift. No hydrocephalus or ventricular trapping. Basilar cisterns remain patent. No other focal lesions. No evidence for acute infarct. No other areas of encephalomalacia to suggest prior infarction or other insult. No evidence for acute intracranial hemorrhage. Few small foci of chronic hemosiderin staining noted  associated with a DVA at the subcortical anterior right frontal lobe (series 20, image 37). No other mass lesion or abnormal enhancement. No extra-axial fluid collection. Pituitary gland and suprasellar region within normal limits. Vascular: Major intracranial vascular flow voids are maintained. Skull and upper cervical spine: Craniocervical junction within normal limits. Upper cervical spine normal. Bone marrow signal intensity within normal limits. No focal marrow replacing lesion. No scalp soft tissue abnormality. Sinuses/Orbits: Globes and orbital soft tissues within normal limits. Paranasal sinuses and mastoid air cells are largely clear. Inner ear structures grossly normal. Other: None. IMPRESSION: 1. Large confluent area of T2/FLAIR signal abnormality involving the left frontal and temporal lobes with associated patchy post-contrast enhancement. Finding most concerning for a primary CNS neoplasm. Possible encephalitis/cerebritis would be the primary differential consideration, and could be considered in the correct clinical setting. 2. Associated regional mass effect with 7 mm of left-to-right shift. No hydrocephalus or ventricular trapping. 3. Incidental small DVA with associated chronic hemosiderin staining at the anterior right frontal lobe. Electronically Signed   By: Jeannine Boga M.D.   On: 12/07/2019 03:43   CT CHEST ABDOMEN PELVIS W CONTRAST  Result Date: 12/07/2019 CLINICAL DATA:  Brain mass EXAM: CT CHEST, ABDOMEN, AND PELVIS WITH CONTRAST TECHNIQUE: Multidetector CT imaging of the chest, abdomen and pelvis was performed following the standard protocol during bolus administration of intravenous contrast. Oral contrast was administered for the CT abdomen and pelvis. CONTRAST:  171m OMNIPAQUE IOHEXOL 300 MG/ML  SOLN COMPARISON:  Chest radiograph December 06, 2019 FINDINGS: CT CHEST FINDINGS Cardiovascular: There is no evident thoracic aortic aneurysm or dissection. Visualized great vessels  appear normal. No major vessel pulmonary embolus demonstrable. No pericardial effusion or pericardial thickening. Mediastinum/Nodes: Thyroid normal in appearance. There is no appreciable thoracic adenopathy. No esophageal lesions are appreciable. Lungs/Pleura: Slight scarring is noted in the extreme apices bilaterally. On axial slice 1621series 5, there is a 7 x 4 mm nodular opacity abutting the pleura in the superior segment of the right lower lobe. No other nodular appearing opacities evident. No appreciable edema or airspace opacity. No pleural effusions are evident. Musculoskeletal: No blastic or lytic bone lesions. No evident chest wall lesions. CT ABDOMEN PELVIS FINDINGS Hepatobiliary: There is hepatic steatosis. No focal liver lesions are appreciable. Gallbladder wall is not appreciably thickened. There is no biliary duct dilatation. Pancreas: There is no pancreatic mass or inflammatory focus. Spleen: No splenic lesions are evident. Adrenals/Urinary Tract: Adrenals bilaterally appear normal. Kidneys bilaterally show no evident mass or hydronephrosis on either side. There is no evident renal or ureteral calculus on either side. Urinary bladder is midline with wall thickness within normal limits. Stomach/Bowel: There is no appreciable bowel wall or mesenteric thickening. No evident bowel obstruction. The terminal ileum appears normal. There is no demonstrable free air or portal  venous air. Vascular/Lymphatic: There is no abdominal aortic aneurysm. No arterial vascular lesions are evident. Major venous structures appear patent. There is no appreciable adenopathy in the abdomen or pelvis. Reproductive: Uterus is midline. There is evidence suggesting recent left ovarian cyst rupture with peripheral enhancement of a cystic mass measuring 2.4 x 2.2 cm. There is fluid tracking from this area into the cul-de-sac region. A small amount of calcification is noted in the posterior cul-de-sac region. No other evidence of  pelvic mass by CT. Other: Appendix appears normal. No abscess in the abdomen or pelvis. No ascites beyond the fluid in the cul-de-sac. Musculoskeletal: No blastic or lytic bone lesions. No intramuscular or abdominal wall lesions evident. IMPRESSION: Chest CT: 1. 7 x 4 mm nodular opacity abutting the pleura in the superior segment right lower lobe. Given this finding and the lesion intracranially, a follow-up chest CT in 3 months to assess for stability would be advisable. No other nodular appearing opacities evident. 2.  No evident adenopathy. CT abdomen and pelvis: 1. Findings suggesting recent ovarian cyst rupture with fluid in the left adnexa and cul-de-sac regions. Small amount of calcification in the cul-de-sac region at the mid sacral level likely represents residua of previous hemorrhage or inflammation. This finding does not have an appearance suggesting neoplastic focus. 2. Hepatic steatosis.  No focal liver lesions evident. 3. No adenopathy appreciable in the abdomen or pelvis. No findings suggesting neoplastic focus in the abdomen or pelvis. 4. No bowel obstruction. No abscess in the abdomen pelvis. No appendiceal region inflammation. Electronically Signed   By: Lowella Grip III M.D.   On: 12/07/2019 10:10   DG Chest Port 1 View  Result Date: 12/06/2019 CLINICAL DATA:  Migraine headache, syncope EXAM: PORTABLE CHEST 1 VIEW COMPARISON:  None. FINDINGS: The heart size and mediastinal contours are within normal limits. Both lungs are clear. The visualized skeletal structures are unremarkable. IMPRESSION: No active disease. Electronically Signed   By: Randa Ngo M.D.   On: 12/06/2019 21:00    Pathology: SURGICAL PATHOLOGY  * THIS IS AN AMENDED REPORT *  CASE: [phone]  PATIENT: Yuridia Kashuba  Surgical Pathology Report   Reason for Amendment #1: Outside consultation   Clinical History: brain lesion (cm)    FINAL MICROSCOPIC DIAGNOSIS:   A. BRAIN, LEFT FRONTAL LOBE, BIOPSY:   - Astrocytoma, IDH-mutant, WHO grade 3  - See comment   B. BRAIN, LEFT FRONTAL LOBE, BIOPSY:  - Astrocytoma, IDH-mutant, WHO grade 3  - See comment   C. BRAIN, LEFT FRONTAL LOBE, BIOPSY:  - Astrocytoma, IDH-mutant, WHO grade 3  - See comment    Assessment/Plan Grade III astrocytoma ([facility]) [C71.9]  We appreciate the opportunity to participate in the care of Dinah Beers.  She presents today with clinical and radiographic syndrome consistent with left frontotemporal grade 3 astrocytoma, IDH mutant.   We had an extensive conversation with her and her husband regarding pathology, prognosis, and available treatment pathways.    We ultimately recommended proceeding with course of intensity modulated radiation therapy and concurrent daily Temozolomide.  Radiation will be administered Mon-Fri over 6 weeks, Temodar will be dosed at 20m/m2 to be given daily over 42 days.  We reviewed side effects of temodar, including fatigue, nausea/vomiting, constipation, and cytopenias.  Chemotherapy should be held for the following:  ANC less than 1,000  Platelets less than 100,000  LFT or creatinine greater than 2x ULN  If clinical concerns/contraindications develop  Every 2 weeks during radiation, labs will be  checked accompanied by a clinical evaluation in the brain tumor clinic.  In the interim she will continue Keppra 783m BID.  No need for corticosteroids.  Screening for potential clinical trials was performed and discussed using eligibility criteria for active protocols [facility], as well as national database available on [email or web address]    The patient is not a candidate for a research protocol at this time due to no suitable study identified.   We spent twenty additional minutes teaching regarding the natural history, biology, and historical experience in the treatment of brain tumors. We then discussed in detail the current  recommendations for therapy focusing on the mode of administration, mechanism of action, anticipated toxicities, and quality of life issues associated with this plan. We also provided teaching sheets for the patient to take home as an additional resource.  All questions were answered. The patient knows to call the clinic with any problems, questions or concerns. No barriers to learning were detected.  The total time spent in the encounter was 60 minutes and more than 50% was on counseling and review of test results   ZVentura Sellers MD Medical Director of Neuro-Oncology [facility] [place]09/24/21 2:14 PM

## 2020-01-08 ENCOUNTER — Encounter: Payer: Self-pay | Admitting: Internal Medicine

## 2020-01-09 ENCOUNTER — Other Ambulatory Visit: Payer: Self-pay | Admitting: *Deleted

## 2020-01-09 MED ORDER — LEVETIRACETAM 750 MG PO TABS: 750.0000 mg | ORAL_TABLET | Freq: Two times a day (BID) | ORAL | 3 refills | Status: DC

## 2020-01-09 NOTE — Progress Notes (Signed)
Radiation Oncology         [phone] ________________________________  Initial Outpatient Consultation  Name: Candace [name] MRN: [id number]  Date: 01/10/2020  DOB: April 04, 1980  ZT:[id number], No Pcp Per  Mickeal Skinner Acey Lav, MD   REFERRING PHYSICIAN: Ventura Sellers, MD  DIAGNOSIS: C71.1   ICD-10-CM   1. 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 Primary malignant neoplasm of frontal lobe (HCC)  C71.1     CHIEF COMPLAINT: Here to discuss management of brain cancer  HISTORY OF PRESENT ILLNESS::Candace [name] is a 39 y.o. female who presented to the ED for evaluation of unusual headache, slurred speech, right leg weakness, left hand numbness, facial spasms, and syncopal episode. CT of head was performed at that time and showed edema throughout the left frontal and temporal lobes that was concerning for vasogenic edema and possible underlying mass lesion. It also showed a 7 mm midline shift.  Subsequently, the patient was admitted to the hospital and was evaluated by neurology/neurosurgery. Biopsy of brain lesion on 12/08/2019 revealed: astrocytoma, IDH-mutant, WHO grade 3.  Pertinent imaging thus far includes: 1. MRI of brain on 12/07/2019 revealing a large confluent area of T2/FLAIR signal abnormality involving the left frontal and temporal lobes with associated pathy post-contrast enhancement. Findings were most concerning for primary CNS neoplasm. Possible encephalitis/cerebritis was the primary differential consideration. There was also associated regional mass effect with 7 mm of left-to-right shift and an incidental small DVA with associated chronic hemosiderin staining at the anterior right frontal lobe. There was no hydrocephalus or ventricular trapping. 2. CT scan of chest/abdomen/pelvis on 12/07/2019 revealing a 7x 4 mm nodular opacity abutting the pleura in the superior segment of the right lower lobe. A follow-up chest CT scan in three months was advised to  assess stability. Additionally, there were findings to suggest a recent ovarian cyst rupture with fluid in the left adnexa and cul-de-sac regions. There was a small amount of calcification in the cul-de-sac region at the mid sacral level that likely represented residua of previous hemorrhage or inflammation. The finding did not have an appearance suggesting neoplastic focus. There were no other nodular appearing opacities evident and there was no evident adenopathy in the chest, abdomen, or pelvis. 3. CT scan of head on 12/07/2019 revealed a 2.7 x 3.4 cm ovoid mass-like region of hyperdensity within the mid-to-posterior left frontal lobe that was centered along the gray-white junction. Given the presence of residual circulating contrast material, it was favored to reflect enhancement of an underlying mass. Petechial hemorrhage at that site was difficult to definitively exclude. The prominent surrounding edema within the left cerebral hemisphere was unchanged and the mass effect with partial effacement of the left lateral ventricle and 7 mm rightward midline shift was also unchanged. 4. MRI of brain on 12/07/2019 revealed a left frontoparietal and temporal mass-like, confluent T2 hyperintense signal with ill-defined patchy enhancement that was unchanged and concerning for primary neoplasm. It also showed an unchanged 6 mm rightward midline shift with partial left lateral ventricle effacement. Finally, there was a right frontal developmental venous anomaly.  5. CT scan of head on 12/09/2019 did not reveal any unexpected findings after brain mass biopsy. Midline shift measured 6 mm.  She has been seen and evaluated by Dr. Mickeal Skinner, who suggests proceeding with a course of intensity modulated radiation therapy and concurrent daily Temozolomide.  She is on Keppra 755m BID.  No need for corticosteroids per Dr VMickeal Skinner    Recent neurologic symptoms, if any:  Seizures: Initial seizure. Patient reports a "mini"  seizure in the airport on her way back from a recent trip to [place].  Experienced "wavy vision" in her right eye, followed by intense headache on left side of head, that resolved and then led to numbness in right hand and down right side of face. She says she sat somewhere quietly and symptoms resolved on their own  Headaches: Has history of headaches/migraines when younger, but states overall they seem to have improved   Nausea: Not since initial seizure  Dizziness/ataxia: Occasionally (especially right after biopsy). Some balance difficulty during hike on recent vacation  Difficulty with hand coordination: Patient denies  Focal numbness/weakness: Ocacsional right side numbness to right hand and face; Reports drooling out of right side of mouth (like her mouth/cheek can't close fully); denies any weakness in extrmities  Visual deficits/changes: Some visual waviness (mainly notices in right eye but may be in both). When she has a migraine she experiences visual symptoms like when you stare into a bright light (trouble seeing, focusing, sensitivity, etc)   Confusion/Memory deficits: Since seizure/biopsy patient reports trouble remembering words/finding what she wants to say. Gets more pronounced with fatigue/end of day  Painful bone metastases at present, if any: Patient denies  SAFETY ISSUES:  Prior radiation? No  Pacemaker/ICD? No  Possible current pregnancy? No  Is the patient on methotrexate? No  Additional Complaints / other details:  Missed F/U appointment with Dr. Marcello Moores for biopsy site assessment, but states she is scheduled to see him next Monday 01/16/20.    PAST MEDICAL HISTORY:  has a past medical history of Headache.    PAST SURGICAL HISTORY: Past Surgical History:  Procedure Laterality Date  . ABDOMINAL SURGERY    . APPLICATION OF CRANIAL NAVIGATION Left 12/08/2019   Procedure: APPLICATION OF CRANIAL NAVIGATION;  Surgeon: Vallarie Mare, MD;  Location: [facility];   Service: Neurosurgery;  Laterality: Left;  APPLICATION OF CRANIAL NAVIGATION  . FRAMELESS  BIOPSY WITH BRAINLAB Left 12/08/2019   Procedure: Left stereotactic brain biopsy with brainlab;  Surgeon: Vallarie Mare, MD;  Location: [facility];  Service: Neurosurgery;  Laterality: Left;  Left stereotactic brain biopsy with brainlab  . WISDOM TOOTH EXTRACTION      FAMILY HISTORY: family history includes Migraines in her mother.  SOCIAL HISTORY:  reports that she has never smoked. She has never used smokeless tobacco. She reports previous alcohol use. She reports that she does not use drugs.  ALLERGIES: Penicillins  MEDICATIONS:  Current Outpatient Medications  Medication Sig Dispense Refill  . levETIRAcetam (KEPPRA) 750 MG tablet Take 1 tablet (750 mg total) by mouth 2 (two) times daily. 60 tablet 3  . MAGNESIUM PO Take 1 tablet by mouth daily.    . TURMERIC PO Take 1 tablet by mouth daily.    Marland Kitchen UNABLE TO FIND Take 1 Dose by mouth daily. Med Name: Mushroom Tincture; for immune support     No current facility-administered medications for this encounter.    REVIEW OF SYSTEMS:  Notable for that above.   PHYSICAL EXAM:  weight is 138 lb 4 oz (62.7 kg). Her oral temperature is 98.8 F (37.1 C). Her blood pressure is 115/88 and her pulse is 62. Her respiration is 16 and oxygen saturation is 100%.   General: Alert and oriented, in no acute distress HEENT: Head is normocephalic.  There is some serous sanguinous crusting at biopsy site. Extraocular movements are intact. Oropharynx is notable for no lesions, no thrush. Heart:  Regular in rate and rhythm with no murmurs, rubs, or gallops. Chest: Clear to auscultation bilaterally, with no rhonchi, wheezes, or rales. Extremities: No cyanosis or edema. Skin: No concerning lesions. Musculoskeletal: symmetric strength and muscle tone throughout. Neurologic: Cranial nerves II through XII are grossly intact. No obvious focalities. Speech is fluent.  Coordination is intact.  Object recall is 3 out of 3 at 5 minutes. Psychiatric: Judgment and insight are intact. Affect is appropriate.   KPS = 90  100 - Normal; no complaints; no evidence of disease. 90   - Able to carry on normal activity; minor signs or symptoms of disease. 80   - Normal activity with effort; some signs or symptoms of disease. 74   - Cares for self; unable to carry on normal activity or to do active work. 60   - Requires occasional assistance, but is able to care for most of his personal needs. 50   - Requires considerable assistance and frequent medical care. 35   - Disabled; requires special care and assistance. 66   - Severely disabled; hospital admission is indicated although death not imminent. 26   - Very sick; hospital admission necessary; active supportive treatment necessary. 10   - Moribund; fatal processes progressing rapidly. 0     - Dead  Karnofsky DA, [place], [place] and [place] [phone]) The use of the nitrogen mustards in the palliative treatment of carcinoma: with particular reference to bronchogenic carcinoma Cancer 1 634-56  LABORATORY DATA:  Lab Results  Component Value Date   WBC 10.9 (H) 12/09/2019   HGB 14.4 12/09/2019   HCT 41.9 12/09/2019   MCV 88.2 12/09/2019   PLT 268 12/09/2019   CMP     Component Value Date/Time   NA 138 12/09/2019 0623   K 4.0 12/09/2019 0623   CL 107 12/09/2019 0623   CO2 21 (L) 12/09/2019 0623   GLUCOSE 121 (H) 12/09/2019 0623   BUN 9 12/09/2019 0623   CREATININE 0.83 12/09/2019 0623   CALCIUM 9.3 12/09/2019 0623   PROT 7.2 12/09/2019 0623   ALBUMIN 4.1 12/09/2019 0623   AST 25 12/09/2019 0623   ALT 19 12/09/2019 0623   ALKPHOS 39 12/09/2019 0623   BILITOT 0.8 12/09/2019 0623   GFRNONAA >60 12/09/2019 0623   GFRAA >60 12/09/2019 0623      No results found for: TSH    RADIOGRAPHY: As above, I personally reviewed her images   IMPRESSION/PLAN:   This is a delightful patient with grade  3 astrocytoma, IDH mutant. I recommend 6-1/2 weeks of intensity modulated radiotherapy for this patient and will collaborate with Dr. Mickeal Skinner so that this is given concurrently with temozolomide.  We discussed the potential risks, benefits, and side effects of radiotherapy. We talked in detail about acute and late effects. We discussed that some of the most bothersome acute effects may be fatigue, headache, nausea, temporary and potentially permanent hair loss, worsening neurologic symptoms, seizures.  We discussed the potential for cognitive slowing after radiation therapy.  We discussed the possibility of pituitary dysfunction and possible permanent injury to the brain long term. No guarantees of treatment were given. A consent form was signed and placed in the patient's medical record. The patient is enthusiastic about proceeding with treatment. I look forward to participating in the patient's care.    The patient and her significant other are enthusiastic about proceeding with treatment locally, at the [facility].  However, they expressed that their family has  encouraged him to obtain a second opinion at a University.  I support them in doing this as I think it will help them move forward with clarity; that said, they would like to proceed with treatment planning and treatment as soon as possible, so we will facilitate that.  Simulation (treatment planning) will take place today -anticipate starting treatment on Tuesday, October 19.  She understands the importance of avoiding pregnancy during treatment.  We will obtain a urine pregnancy test today out of caution.  On date of service, in total, I spent 65 minutes on this encounter. Patient was seen in person.  __________________________________________   Eppie Gibson, MD  This document serves as a record of services personally performed by Eppie Gibson, MD. It was created on his behalf by Clerance Lav, a trained medical scribe. The  creation of this record is based on the scribe's personal observations and the provider's statements to them. This document has been checked and approved by the attending provider.

## 2020-01-09 NOTE — Progress Notes (Signed)
Location/Histology of Brain Tumor:  Astrocytoma, IDH-mutant, WHO grade 3.  Patient presented with symptoms of:   ED for evaluation of unusual headache, slurred speech, right leg weakness, left hand numbness, facial spasms, and syncopal episode. CT of head was performed at that time and showed edema throughout the left frontal and temporal lobes that was concerning for vasogenic edema and possible underlying mass lesion. It also showed a 7 mm midline shift. Subsequently, the patient was admitted to the hospital and was evaluated by neurology/neurosurgery. Biopsy of brain lesion on 12/08/2019 revealed: astrocytoma, IDH-mutant, WHO grade 3.  Past or anticipated interventions, if any, per neurosurgery:  12/08/2019 Dr. Duffy Rhody Left stereotactic needle biopsy of brain lesion  Past or anticipated interventions, if any, per medical oncology:  Under care of Dr. Cecil Cobbs 12/23/2019 -We ultimately recommended proceeding with course of intensity modulated radiation therapy and concurrent daily Temozolomide.  Radiation will be administered Mon-Fri over 6 weeks, Temodar will be dosed at 69m/m2 to be given daily over 42 days. -Every 2 weeks during radiation, labs will be checked accompanied by a clinical evaluation in the brain tumor clinic. -In the interim she will continue Keppra 7561mBID.  No need for corticosteroids -The patient is not a candidate for a research protocol at this time due to no suitable study identified  Dose of Decadron, if applicable: Per Dr. VaMickeal SkinnerNo need for corticosteroids  Recent neurologic symptoms, if any:   Seizures: Initial seizure. Patient reports a "mini" seizure in the [place] Experienced "wavy vision" in her right eye, followed by intense headache on left side of head, that resolved and then led to numbness in right hand and down right side of face. She says she sat somewhere quietly and symptoms resolved on their  own  Headaches: Has history of headaches/migraines when younger, but states overall they seem to have improved   Nausea: Not since initial seizure  Dizziness/ataxia: Occasionally (especially right after biopsy). Some balance difficulty during hike on recent vacation  Difficulty with hand coordination: Patient denies  Focal numbness/weakness: Ocacsional right side numbness to right hand and face; Reports drooling out of right side of mouth (like her mouth/cheek can't close fully); denies any weakness in extrmities  Visual deficits/changes: Some visual waviness (mainly notices in right eye but may be in both). When she has a migraine she experiences visual symptoms like when you stare into a bright light (trouble seeing, focusing, sensitivity, etc)   Confusion/Memory deficits: Since seizure/biopsy patient reports trouble remembering words/finding what she wants to say. Gets more pronounced with fatigue/end of day  Painful bone metastases at present, if any: Patient denies  SAFETY ISSUES:  Prior radiation? No  Pacemaker/ICD? No  Possible current pregnancy? No  Is the patient on methotrexate? No  Additional Complaints / other details:  Missed F/U appointment with Dr. ThMarcello Mooresor biopsy site assessment, but states she is scheduled to see him next Monday 01/16/20.

## 2020-01-10 ENCOUNTER — Ambulatory Visit
Admission: RE | Admit: 2020-01-10 | Discharge: 2020-01-10 | Disposition: A | Discharge status: Home / Self Care | Payer: Self-pay | Source: Ambulatory Visit | Attending: Radiation Oncology | Admitting: Radiation Oncology

## 2020-01-10 ENCOUNTER — Other Ambulatory Visit: Payer: Self-pay

## 2020-01-10 VITALS — BP 115/88 | HR 62 | Temp 98.8°F | Resp 16 | Wt 138.2 lb

## 2020-01-10 DIAGNOSIS — C719 Malignant neoplasm of brain, unspecified: Secondary | ICD-10-CM

## 2020-01-10 DIAGNOSIS — C711 Malignant neoplasm of frontal lobe: Secondary | ICD-10-CM | POA: Insufficient documentation

## 2020-01-10 DIAGNOSIS — Z510 Encounter for antineoplastic radiation therapy: Secondary | ICD-10-CM | POA: Insufficient documentation

## 2020-01-10 LAB — PREGNANCY, URINE: Preg Test, Ur: NEGATIVE

## 2020-01-13 ENCOUNTER — Telehealth: Payer: Self-pay | Admitting: Pharmacist

## 2020-01-13 ENCOUNTER — Other Ambulatory Visit: Payer: Self-pay | Admitting: Internal Medicine

## 2020-01-13 ENCOUNTER — Encounter: Payer: Self-pay | Admitting: Radiation Oncology

## 2020-01-13 ENCOUNTER — Encounter: Payer: Self-pay | Admitting: Internal Medicine

## 2020-01-13 DIAGNOSIS — C711 Malignant neoplasm of frontal lobe: Secondary | ICD-10-CM | POA: Insufficient documentation

## 2020-01-13 MED ORDER — ONDANSETRON HCL 8 MG PO TABS: 8.0000 mg | ORAL_TABLET | Freq: Three times a day (TID) | ORAL | 1 refills | Status: DC | PRN

## 2020-01-13 MED ORDER — TEMOZOLOMIDE 20 MG PO CAPS: 20.0000 mg | ORAL_CAPSULE | Freq: Every day | ORAL | 0 refills | Status: DC

## 2020-01-13 MED ORDER — TEMOZOLOMIDE 100 MG PO CAPS: 100.0000 mg | ORAL_CAPSULE | Freq: Every day | ORAL | 0 refills | Status: DC

## 2020-01-13 MED ORDER — ONDANSETRON HCL 8 MG PO TABS: 8.0000 mg | ORAL_TABLET | Freq: Two times a day (BID) | ORAL | 1 refills | Status: DC | PRN

## 2020-01-13 MED ORDER — TEMOZOLOMIDE 100 MG PO CAPS
100.0000 mg | ORAL_CAPSULE | Freq: Every day | ORAL | 0 refills | Status: DC
Start: 2020-01-13 — End: 2020-01-16

## 2020-01-13 NOTE — Telephone Encounter (Signed)
Oral Oncology Pharmacist Encounter  Received new prescription for Temodar (temozolomide) for the treatment of grade III astrocytoma, IDH mutant in conjunction with radiation, planned duration 42 days.  Prescription dose and frequency assessed for appropriateness. Appropriate for therapy initiation.   CBC w/ Diff, CMP, phos and magnesium from 12/09/19 assessed, labs OK for treatment initiation. Noted patient with negative pregnancy test from 01/10/20.   Current medication list in Epic reviewed, no relevant/significant DDIs with Temodar identified.  Evaluated chart and no patient barriers to medication adherence noted.   Prescription has been e-scribed to the [facility] for benefits analysis and approval.  Oral Oncology Clinic will continue to follow for insurance authorization, copayment issues, initial counseling and start date.  Leron Croak, PharmD, BCPS Hematology/Oncology Clinical Pharmacist [facility] [phone] 01/13/2020 11:35 AM

## 2020-01-13 NOTE — Progress Notes (Signed)
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 

## 2020-01-13 NOTE — Progress Notes (Signed)
I emailed Ailene Ravel and Vincente Liberty in the radiation department requesting they reach out to the pt regarding the [employer].

## 2020-01-14 ENCOUNTER — Encounter: Payer: Self-pay | Admitting: Internal Medicine

## 2020-01-16 MED ORDER — TEMOZOLOMIDE 20 MG PO CAPS: 20.0000 mg | ORAL_CAPSULE | Freq: Every day | ORAL | 0 refills | Status: DC

## 2020-01-16 MED ORDER — TEMOZOLOMIDE 100 MG PO CAPS: 100.0000 mg | ORAL_CAPSULE | Freq: Every day | ORAL | 0 refills | Status: DC

## 2020-01-16 MED ORDER — ONDANSETRON HCL 8 MG PO TABS: 8.0000 mg | ORAL_TABLET | Freq: Three times a day (TID) | ORAL | 1 refills | Status: DC | PRN

## 2020-01-16 NOTE — Telephone Encounter (Signed)
Oral Chemotherapy Pharmacist Encounter   Attempted to reach patient to provide update and offer for initial counseling on oral medication: Temodar (temozolomide).   No answer. Left voicemail for patient to call back for initial counseling session.   Leron Croak, PharmD, BCPS Hematology/Oncology Clinical Pharmacist [facility] [phone] 01/16/2020 4:41 PM

## 2020-01-16 NOTE — Telephone Encounter (Signed)
Oral Oncology Pharmacist Encounter  Prescription for Temodar (temozolomide) redirected to [facility].  Called and notified Candace [name] that Temodar has been sent to [facility] for initial fill.   Oral Oncology Clinic will continue to follow.   Leron Croak, PharmD, BCPS Hematology/Oncology Clinical Pharmacist [facility] [phone] 01/16/2020 11:17 AM

## 2020-01-17 ENCOUNTER — Ambulatory Visit
Admission: RE | Admit: 2020-01-17 | Discharge: 2020-01-17 | Disposition: A | Discharge status: Home / Self Care | Payer: Self-pay | Source: Ambulatory Visit | Attending: Radiation Oncology | Admitting: Radiation Oncology

## 2020-01-17 ENCOUNTER — Other Ambulatory Visit: Payer: Self-pay

## 2020-01-17 NOTE — Telephone Encounter (Signed)
Oral Chemotherapy Pharmacist Encounter  I spoke with patient for overview of: Temodar (temozolomide) for the treatment of grade III astrocytoma, IDH mutant in conjunction with radiation, planned duration of concomitant phase 42 days of therapy.   Counseled patient on administration, dosing, side effects, monitoring, drug-food interactions, safe handling, storage, and disposal.  Patient will take Temodar 174m capsules and Temodar 2170mcapsules, 120 mg total daily dose, by mouth once daily, may take at bedtime and on an empty stomach to decrease nausea and vomiting.  Patient will take Temodar concurrent with radiation for 42 days straight.  Temodar and radiation start date: 01/17/20   Patient will take Zofran 70m81mablet, 1 tablet by mouth 30-60 min prior to Temodar dose to help decrease N/V once starting adjuvant therapy. Prophylactic Zofran will not be used at initiation of concurrent phase, but will be initiated if nausea develops despite Temodar administration on an empty stomach and at bedtime.   Adverse effects include but are not limited to: nausea, vomiting, anorexia, GI upset, and fatigue. Rare but serious adverse effects of pneumocystis pneumonia and secondary malignancy also discussed.  PCP prophylaxis will not be initiated at this time, but may be added based on lymphocyte count in the future.  Reviewed with patient importance of keeping a medication schedule and plan for any missed doses. No barriers to medication adherence identified.  Medication reconciliation performed and medication/allergy list updated.  [facility]. Patient [facility]   All questions answered.  Ms. YelRausiced understanding and appreciation.   Medication education handout and patient medication calendar [facility]. Patient knows to call the office with questions or concerns. Oral Chemotherapy Clinic phone number provided to patient.   RebLeron CroakharmD, BCPS Hematology/Oncology Clinical Pharmacist [phone]/19/2021 11:04 AM

## 2020-01-18 ENCOUNTER — Telehealth: Payer: Self-pay | Admitting: *Deleted

## 2020-01-18 ENCOUNTER — Ambulatory Visit
Admission: RE | Admit: 2020-01-18 | Discharge: 2020-01-18 | Disposition: A | Discharge status: Home / Self Care | Payer: Self-pay | Source: Ambulatory Visit | Attending: Radiation Oncology | Admitting: Radiation Oncology

## 2020-01-18 NOTE — Telephone Encounter (Signed)
Called husband, Fara Olden in response to N/V complaint over night per [employer] line. Confirmed last night was her 1st Temodar dose and she did not pre-med for that dose. Took Zofran this morning and nausea has resolved. Instructed him to start having her take the Zofran 1 hour prior to each Temodar dose and continue Q8hr prn. Call if nausea persists, can add low dose Compazine as well per pharmacy suggestion.

## 2020-01-19 ENCOUNTER — Ambulatory Visit
Admission: RE | Admit: 2020-01-19 | Discharge: 2020-01-19 | Disposition: A | Discharge status: Home / Self Care | Payer: Self-pay | Source: Ambulatory Visit | Attending: Radiation Oncology | Admitting: Radiation Oncology

## 2020-01-19 ENCOUNTER — Telehealth: Payer: Self-pay | Admitting: Internal Medicine

## 2020-01-19 ENCOUNTER — Other Ambulatory Visit: Payer: Self-pay

## 2020-01-19 NOTE — Telephone Encounter (Signed)
Scheduled appointments per 10/21 sch msg. Spoke to patient who is aware of appointment date and time.

## 2020-01-20 ENCOUNTER — Ambulatory Visit
Admission: RE | Admit: 2020-01-20 | Discharge: 2020-01-20 | Disposition: A | Discharge status: Home / Self Care | Payer: Self-pay | Source: Ambulatory Visit | Attending: Radiation Oncology | Admitting: Radiation Oncology

## 2020-01-23 ENCOUNTER — Other Ambulatory Visit: Payer: Self-pay

## 2020-01-23 ENCOUNTER — Ambulatory Visit
Admission: RE | Admit: 2020-01-23 | Discharge: 2020-01-23 | Disposition: A | Discharge status: Home / Self Care | Payer: Self-pay | Source: Ambulatory Visit | Attending: Radiation Oncology | Admitting: Radiation Oncology

## 2020-01-23 DIAGNOSIS — C711 Malignant neoplasm of frontal lobe: Secondary | ICD-10-CM

## 2020-01-23 MED ORDER — SONAFINE EX EMUL
1.0000 "application " | Freq: Two times a day (BID) | CUTANEOUS | Status: DC
  Administered 2020-01-23: 1 via TOPICAL

## 2020-01-23 NOTE — Progress Notes (Signed)
Pt here for patient teaching.    Pt given Radiation and You booklet, skin care instructions and Sonafine.    Reviewed areas of pertinence such as fatigue, hair loss, nausea and vomiting, skin changes, headache and blurry vision .   Pt able to give teach back of to pat skin, use unscented/gentle soap and drink plenty of water,apply Sonafine bid and avoid applying anything to skin within 4 hours of treatment.   Pt demonstrated understanding and verbalizes understanding of information given and will contact nursing with any questions or concerns.    [email or web address]

## 2020-01-24 ENCOUNTER — Other Ambulatory Visit: Payer: Self-pay

## 2020-01-24 ENCOUNTER — Ambulatory Visit
Admission: RE | Admit: 2020-01-24 | Discharge: 2020-01-24 | Disposition: A | Discharge status: Home / Self Care | Payer: Self-pay | Source: Ambulatory Visit | Attending: Radiation Oncology | Admitting: Radiation Oncology

## 2020-01-25 ENCOUNTER — Ambulatory Visit
Admission: RE | Admit: 2020-01-25 | Discharge: 2020-01-25 | Disposition: A | Discharge status: Home / Self Care | Payer: Self-pay | Source: Ambulatory Visit | Attending: Radiation Oncology | Admitting: Radiation Oncology

## 2020-01-26 ENCOUNTER — Other Ambulatory Visit: Payer: Self-pay

## 2020-01-26 ENCOUNTER — Ambulatory Visit
Admission: RE | Admit: 2020-01-26 | Discharge: 2020-01-26 | Disposition: A | Discharge status: Home / Self Care | Payer: Self-pay | Source: Ambulatory Visit | Attending: Radiation Oncology | Admitting: Radiation Oncology

## 2020-01-27 ENCOUNTER — Other Ambulatory Visit: Payer: Self-pay

## 2020-01-27 ENCOUNTER — Ambulatory Visit
Admission: RE | Admit: 2020-01-27 | Discharge: 2020-01-27 | Disposition: A | Discharge status: Home / Self Care | Payer: Self-pay | Source: Ambulatory Visit | Attending: Radiation Oncology | Admitting: Radiation Oncology

## 2020-01-30 ENCOUNTER — Ambulatory Visit
Admission: RE | Admit: 2020-01-30 | Discharge: 2020-01-30 | Disposition: A | Discharge status: Home / Self Care | Payer: Self-pay | Source: Ambulatory Visit | Attending: Radiation Oncology | Admitting: Radiation Oncology

## 2020-01-30 ENCOUNTER — Other Ambulatory Visit: Payer: Self-pay

## 2020-01-30 DIAGNOSIS — C711 Malignant neoplasm of frontal lobe: Secondary | ICD-10-CM | POA: Insufficient documentation

## 2020-01-30 DIAGNOSIS — Z510 Encounter for antineoplastic radiation therapy: Secondary | ICD-10-CM | POA: Insufficient documentation

## 2020-01-31 ENCOUNTER — Ambulatory Visit
Admission: RE | Admit: 2020-01-31 | Discharge: 2020-01-31 | Disposition: A | Discharge status: Home / Self Care | Payer: Self-pay | Source: Ambulatory Visit | Attending: Radiation Oncology | Admitting: Radiation Oncology

## 2020-01-31 ENCOUNTER — Inpatient Hospital Stay (HOSPITAL_BASED_OUTPATIENT_CLINIC_OR_DEPARTMENT_OTHER): Payer: Self-pay | Admitting: Internal Medicine

## 2020-01-31 ENCOUNTER — Other Ambulatory Visit: Payer: Self-pay

## 2020-01-31 ENCOUNTER — Inpatient Hospital Stay: Payer: Self-pay | Attending: Internal Medicine

## 2020-01-31 VITALS — BP 116/73 | HR 61 | Temp 97.5°F | Resp 18 | Ht 68.0 in | Wt 138.0 lb

## 2020-01-31 DIAGNOSIS — R42 Dizziness and giddiness: Secondary | ICD-10-CM | POA: Insufficient documentation

## 2020-01-31 DIAGNOSIS — C711 Malignant neoplasm of frontal lobe: Secondary | ICD-10-CM | POA: Insufficient documentation

## 2020-01-31 DIAGNOSIS — Z8249 Family history of ischemic heart disease and other diseases of the circulatory system: Secondary | ICD-10-CM | POA: Insufficient documentation

## 2020-01-31 DIAGNOSIS — D696 Thrombocytopenia, unspecified: Secondary | ICD-10-CM | POA: Insufficient documentation

## 2020-01-31 DIAGNOSIS — R569 Unspecified convulsions: Secondary | ICD-10-CM

## 2020-01-31 DIAGNOSIS — R55 Syncope and collapse: Secondary | ICD-10-CM | POA: Insufficient documentation

## 2020-01-31 DIAGNOSIS — R200 Anesthesia of skin: Secondary | ICD-10-CM | POA: Insufficient documentation

## 2020-01-31 DIAGNOSIS — K5900 Constipation, unspecified: Secondary | ICD-10-CM | POA: Insufficient documentation

## 2020-01-31 DIAGNOSIS — Z79899 Other long term (current) drug therapy: Secondary | ICD-10-CM | POA: Insufficient documentation

## 2020-01-31 DIAGNOSIS — R4781 Slurred speech: Secondary | ICD-10-CM | POA: Insufficient documentation

## 2020-01-31 DIAGNOSIS — Z880 Allergy status to penicillin: Secondary | ICD-10-CM | POA: Insufficient documentation

## 2020-01-31 LAB — CBC WITH DIFFERENTIAL (CANCER CENTER ONLY)
Abs Immature Granulocytes: 0.01 10*3/uL (ref 0.00–0.07)
Basophils Absolute: 0 10*3/uL (ref 0.0–0.1)
Basophils Relative: 1 %
Eosinophils Absolute: 0.1 10*3/uL (ref 0.0–0.5)
Eosinophils Relative: 2 %
HCT: 39.4 % (ref 36.0–46.0)
Hemoglobin: 13.8 g/dL (ref 12.0–15.0)
Immature Granulocytes: 0 %
Lymphocytes Relative: 24 %
Lymphs Abs: 1 10*3/uL (ref 0.7–4.0)
MCH: 30.5 pg (ref 26.0–34.0)
MCHC: 35 g/dL (ref 30.0–36.0)
MCV: 87.2 fL (ref 80.0–100.0)
Monocytes Absolute: 0.4 10*3/uL (ref 0.1–1.0)
Monocytes Relative: 10 %
Neutro Abs: 2.5 10*3/uL (ref 1.7–7.7)
Neutrophils Relative %: 63 %
Platelet Count: 244 10*3/uL (ref 150–400)
RBC: 4.52 MIL/uL (ref 3.87–5.11)
RDW: 11.6 % (ref 11.5–15.5)
WBC Count: 4 10*3/uL (ref 4.0–10.5)
nRBC: 0 % (ref 0.0–0.2)

## 2020-01-31 LAB — CMP (CANCER CENTER ONLY)
ALT: 22 U/L (ref 0–44)
AST: 17 U/L (ref 15–41)
Albumin: 4 g/dL (ref 3.5–5.0)
Alkaline Phosphatase: 46 U/L (ref 38–126)
Anion gap: 7 (ref 5–15)
BUN: 17 mg/dL (ref 6–20)
CO2: 24 mmol/L (ref 22–32)
Calcium: 8.8 mg/dL — ABNORMAL LOW (ref 8.9–10.3)
Chloride: 107 mmol/L (ref 98–111)
Creatinine: 1.01 mg/dL — ABNORMAL HIGH (ref 0.44–1.00)
GFR, Estimated: >60 mL/min (ref >60)
Glucose, Bld: 94 mg/dL (ref 70–99)
Potassium: 3.8 mmol/L (ref 3.5–5.1)
Sodium: 138 mmol/L (ref 135–145)
Total Bilirubin: 0.9 mg/dL (ref 0.3–1.2)
Total Protein: 6.6 g/dL (ref 6.5–8.1)

## 2020-01-31 NOTE — Progress Notes (Signed)
[facility] at [place], [place] [address] [phone]   Interval Evaluation  Date of Service: 01/31/20 Patient Name: Candace [name] Patient MRN: [id number] Patient DOB: [birth year] Provider: Ventura Sellers, MD  Identifying Statement:  Candace [name] is a 39 y.o. female with left temporal grade 3 astrocytoma    Oncologic History: Oncology History  Astrocytoma of frontal lobe ([facility])  12/08/2019 Surgery   Stereotactic R temporal biopsy by Dr. Marcello Moores.  Path demonstrates Astrocytoma, IDH-mt, WHO grade 3   01/17/2020 -  Chemotherapy   The patient had temozolomide (TEMODAR) 20 MG capsule, 20 mg (100 % of original dose 20 mg), Oral, Daily, 0 of 1 cycle, Start date: 01/13/2020, End date: -- Dose modification: 20 mg (original dose 20 mg, Cycle 1) temozolomide (TEMODAR) 100 MG capsule, 100 mg (100 % of original dose 100 mg), Oral, Daily, 0 of 1 cycle, Start date: 01/13/2020, End date: -- Dose modification: 100 mg (original dose 100 mg, Cycle 1)  for chemotherapy treatment.      Biomarkers:  MGMT Unknown.  IDH 1/2 Mutated.  EGFR Unknown  TERT Unknown   Interval History:  Candace [name] present to clinic today now having completed first two weeks of IMRT and Temozolomide.  She is handling chemo well aside from nausea/vomiting the first night without zofran.  Constipation is main problem currently, and occasional word finding difficulty.  Otherwise fully independent and active.  H+P (12/23/19) Patient presented to medical attention early in September with first ever seizure.  Event was described as "sudden onset left arm numbness" followed by "face twisting, and loss of consciousness for at least 30 minutes".  CNS imaging demonstrated a large left frontal non enhancing mass consistent with primary brain tumor.  She underwent biopsy on 12/08/19 with Dr. Marcello Moores.  Following surgery she has some slurring of speech but no other significant  complaints, returned to prior baseline upon discharge.  Now home, she has had no breathrough events and no focal neurologic complaints.    Medications: Current Outpatient Medications on File Prior to Visit  Medication Sig Dispense Refill  . levETIRAcetam (KEPPRA) 750 MG tablet Take 1 tablet (750 mg total) by mouth 2 (two) times daily. 60 tablet 3  . MAGNESIUM PO Take 1 tablet by mouth daily.    . ondansetron (ZOFRAN) 8 MG tablet Take 1 tablet (8 mg total) by mouth every 8 (eight) hours as needed for nausea or vomiting. Take 30-36mn before temodar 60 tablet 1  . temozolomide (TEMODAR) 100 MG capsule Take 1 capsule (100 mg total) by mouth daily. May take on an empty stomach to decrease nausea & vomiting. 42 capsule 0  . temozolomide (TEMODAR) 20 MG capsule Take 1 capsule (20 mg total) by mouth daily. May take on an empty stomach to decrease nausea & vomiting. 42 capsule 0  . TURMERIC PO Take 1 tablet by mouth daily.    .Marland KitchenUNABLE TO FIND Take 1 Dose by mouth daily. Med Name: Mushroom Tincture; for immune support     No current facility-administered medications on file prior to visit.    Allergies:  Allergies  Allergen Reactions  . Penicillins Rash and Other (See Comments)    Pt does not recall so well   Past Medical History:  Past Medical History:  Diagnosis Date  . Headache    Past Surgical History:  Past Surgical History:  Procedure Laterality Date  . ABDOMINAL SURGERY    . APPLICATION OF CRANIAL  NAVIGATION Left 12/08/2019   Procedure: APPLICATION OF CRANIAL NAVIGATION;  Surgeon: Vallarie Mare, MD;  Location: [facility];  Service: Neurosurgery;  Laterality: Left;  APPLICATION OF CRANIAL NAVIGATION  . FRAMELESS  BIOPSY WITH BRAINLAB Left 12/08/2019   Procedure: Left stereotactic brain biopsy with brainlab;  Surgeon: Vallarie Mare, MD;  Location: [facility];  Service: Neurosurgery;  Laterality: Left;  Left stereotactic brain biopsy with brainlab  . WISDOM TOOTH EXTRACTION     Social  History:  Social History   Socioeconomic History  . Marital status: Married    Spouse name: Not on file  . Number of children: Not on file  . Years of education: Not on file  . Highest education level: Not on file  Occupational History  . Not on file  Tobacco Use  . Smoking status: Never Smoker  . Smokeless tobacco: Never Used  Substance and Sexual Activity  . Alcohol use: Not Currently  . Drug use: Never  . Sexual activity: Not on file  Other Topics Concern  . Not on file  Social History Narrative  . Not on file   Social Determinants of Health   Financial Resource Strain:   . Difficulty of Paying Living Expenses: Not on file  Food Insecurity:   . Worried About Charity fundraiser in the Last Year: Not on file  . Ran Out of Food in the Last Year: Not on file  Transportation Needs:   . Lack of Transportation (Medical): Not on file  . Lack of Transportation (Non-Medical): Not on file  Physical Activity:   . Days of Exercise per Week: Not on file  . Minutes of Exercise per Session: Not on file  Stress:   . Feeling of Stress : Not on file  Social Connections:   . Frequency of Communication with Friends and Family: Not on file  . Frequency of Social Gatherings with Friends and Family: Not on file  . Attends Religious Services: Not on file  . Active Member of Clubs or Organizations: Not on file  . Attends Archivist Meetings: Not on file  . Marital Status: Not on file  Intimate Partner Violence:   . Fear of Current or Ex-Partner: Not on file  . Emotionally Abused: Not on file  . Physically Abused: Not on file  . Sexually Abused: Not on file   Family History:  Family History  Problem Relation Age of Onset  . Migraines Mother     Review of Systems: Constitutional: Doesn't report fevers, chills or abnormal weight loss Eyes: Doesn't report blurriness of vision Ears, nose, mouth, throat, and face: Doesn't report sore throat Respiratory: Doesn't report  cough, dyspnea or wheezes Cardiovascular: Doesn't report palpitation, chest discomfort  Gastrointestinal:  Doesn't report nausea, constipation, diarrhea GU: Doesn't report incontinence Skin: Doesn't report skin rashes Neurological: Per HPI Musculoskeletal: Doesn't report joint pain Behavioral/Psych: Doesn't report anxiety  Physical Exam: Vitals:   01/31/20 0836  BP: 116/73  Pulse: 61  Resp: 18  Temp: (!) 97.5 F (36.4 C)  SpO2: 100%   KPS: 90. General: Alert, cooperative, pleasant, in no acute distress Head: Normal EENT: No conjunctival injection or scleral icterus.  Lungs: Resp effort normal Cardiac: Regular rate Abdomen: Non-distended abdomen Skin: No rashes cyanosis or petechiae. Extremities: No clubbing or edema  Neurologic Exam: Mental Status: Awake, alert, attentive to examiner. Oriented to self and environment. Language is fluent with intact comprehension.  Cranial Nerves: Visual acuity is grossly normal. Visual fields are full.  Extra-ocular movements intact. No ptosis. Face is symmetric Motor: Tone and bulk are normal. Power is full in both arms and legs. Reflexes are symmetric, no pathologic reflexes present.  Sensory: Intact to light touch Gait: Normal.   Labs: I have reviewed the data as listed    Component Value Date/Time   NA 138 01/31/2020 0823   K 3.8 01/31/2020 0823   CL 107 01/31/2020 0823   CO2 24 01/31/2020 0823   GLUCOSE 94 01/31/2020 0823   BUN 17 01/31/2020 0823   CREATININE 1.01 (H) 01/31/2020 0823   CALCIUM 8.8 (L) 01/31/2020 0823   PROT 6.6 01/31/2020 0823   ALBUMIN 4.0 01/31/2020 0823   AST 17 01/31/2020 0823   ALT 22 01/31/2020 0823   ALKPHOS 46 01/31/2020 0823   BILITOT 0.9 01/31/2020 0823   GFRNONAA >60 01/31/2020 0823   GFRAA >60 12/09/2019 0623   Lab Results  Component Value Date   WBC 4.0 01/31/2020   NEUTROABS 2.5 01/31/2020   HGB 13.8 01/31/2020   HCT 39.4 01/31/2020   MCV 87.2 01/31/2020   PLT 244 01/31/2020     Assessment/Plan Astrocytoma of frontal lobe (HCC) [C71.1]  We appreciate the opportunity to participate in the care of Dinah Beers.  She is clinically stable today, having completed 2 weeks of IMRT+TMZ.  We  recommended continuing with course of intensity modulated radiation therapy and concurrent daily Temozolomide.  Radiation will be administered Mon-Fri over 6 weeks, Temodar will be dosed at 57m/m2 to be given daily over 42 days.  We reviewed side effects of temodar, including fatigue, nausea/vomiting, constipation, and cytopenias.  Chemotherapy should be held for the following:  ANC less than 1,000  Platelets less than 100,000  LFT or creatinine greater than 2x ULN  If clinical concerns/contraindications develop  Every 2 weeks during radiation, labs will be checked accompanied by a clinical evaluation in the brain tumor clinic.  Will continue Keppra 7529mBID.    All questions were answered. The patient knows to call the clinic with any problems, questions or concerns. No barriers to learning were detected.  The total time spent in the encounter was 30 minutes and more than 50% was on counseling and review of test results   ZaVentura SellersMD Medical Director of Neuro-Oncology [facility] [place] We[place]alcon1/02/21 9:03 AM

## 2020-02-01 ENCOUNTER — Ambulatory Visit
Admission: RE | Admit: 2020-02-01 | Discharge: 2020-02-01 | Disposition: A | Discharge status: Home / Self Care | Payer: Self-pay | Source: Ambulatory Visit | Attending: Radiation Oncology | Admitting: Radiation Oncology

## 2020-02-01 ENCOUNTER — Other Ambulatory Visit: Payer: Self-pay

## 2020-02-02 ENCOUNTER — Ambulatory Visit
Admission: RE | Admit: 2020-02-02 | Discharge: 2020-02-02 | Disposition: A | Discharge status: Home / Self Care | Payer: Self-pay | Source: Ambulatory Visit | Attending: Radiation Oncology | Admitting: Radiation Oncology

## 2020-02-03 ENCOUNTER — Ambulatory Visit
Admission: RE | Admit: 2020-02-03 | Discharge: 2020-02-03 | Disposition: A | Discharge status: Home / Self Care | Payer: Self-pay | Source: Ambulatory Visit | Attending: Radiation Oncology | Admitting: Radiation Oncology

## 2020-02-06 ENCOUNTER — Ambulatory Visit
Admission: RE | Admit: 2020-02-06 | Discharge: 2020-02-06 | Disposition: A | Discharge status: Home / Self Care | Payer: Self-pay | Source: Ambulatory Visit | Attending: Radiation Oncology | Admitting: Radiation Oncology

## 2020-02-07 ENCOUNTER — Ambulatory Visit
Admission: RE | Admit: 2020-02-07 | Discharge: 2020-02-07 | Disposition: A | Discharge status: Home / Self Care | Payer: Self-pay | Source: Ambulatory Visit | Attending: Radiation Oncology | Admitting: Radiation Oncology

## 2020-02-07 ENCOUNTER — Other Ambulatory Visit: Payer: Self-pay

## 2020-02-08 ENCOUNTER — Ambulatory Visit
Admission: RE | Admit: 2020-02-08 | Discharge: 2020-02-08 | Disposition: A | Discharge status: Home / Self Care | Payer: Self-pay | Source: Ambulatory Visit | Attending: Radiation Oncology | Admitting: Radiation Oncology

## 2020-02-09 ENCOUNTER — Ambulatory Visit
Admission: RE | Admit: 2020-02-09 | Discharge: 2020-02-09 | Disposition: A | Discharge status: Home / Self Care | Payer: Self-pay | Source: Ambulatory Visit | Attending: Radiation Oncology | Admitting: Radiation Oncology

## 2020-02-09 ENCOUNTER — Other Ambulatory Visit: Payer: Self-pay

## 2020-02-10 ENCOUNTER — Ambulatory Visit
Admission: RE | Admit: 2020-02-10 | Discharge: 2020-02-10 | Disposition: A | Discharge status: Home / Self Care | Payer: Self-pay | Source: Ambulatory Visit | Attending: Radiation Oncology | Admitting: Radiation Oncology

## 2020-02-13 ENCOUNTER — Ambulatory Visit
Admission: RE | Admit: 2020-02-13 | Discharge: 2020-02-13 | Disposition: A | Discharge status: Home / Self Care | Payer: Self-pay | Source: Ambulatory Visit | Attending: Radiation Oncology | Admitting: Radiation Oncology

## 2020-02-14 ENCOUNTER — Other Ambulatory Visit: Payer: Self-pay

## 2020-02-14 ENCOUNTER — Inpatient Hospital Stay (HOSPITAL_BASED_OUTPATIENT_CLINIC_OR_DEPARTMENT_OTHER): Payer: Self-pay | Admitting: Internal Medicine

## 2020-02-14 ENCOUNTER — Ambulatory Visit
Admission: RE | Admit: 2020-02-14 | Discharge: 2020-02-14 | Disposition: A | Discharge status: Home / Self Care | Payer: Self-pay | Source: Ambulatory Visit | Attending: Radiation Oncology | Admitting: Radiation Oncology

## 2020-02-14 ENCOUNTER — Inpatient Hospital Stay: Payer: Self-pay

## 2020-02-14 VITALS — BP 112/74 | HR 62 | Temp 97.7°F | Resp 18 | Ht 68.0 in | Wt 135.6 lb

## 2020-02-14 DIAGNOSIS — R569 Unspecified convulsions: Secondary | ICD-10-CM

## 2020-02-14 DIAGNOSIS — C711 Malignant neoplasm of frontal lobe: Secondary | ICD-10-CM

## 2020-02-14 LAB — CMP (CANCER CENTER ONLY)
ALT: 26 U/L (ref 0–44)
AST: 21 U/L (ref 15–41)
Albumin: 4.1 g/dL (ref 3.5–5.0)
Alkaline Phosphatase: 43 U/L (ref 38–126)
Anion gap: 7 (ref 5–15)
BUN: 15 mg/dL (ref 6–20)
CO2: 25 mmol/L (ref 22–32)
Calcium: 8.8 mg/dL — ABNORMAL LOW (ref 8.9–10.3)
Chloride: 107 mmol/L (ref 98–111)
Creatinine: 1.03 mg/dL — ABNORMAL HIGH (ref 0.44–1.00)
GFR, Estimated: >60 mL/min (ref >60)
Glucose, Bld: 90 mg/dL (ref 70–99)
Potassium: 4 mmol/L (ref 3.5–5.1)
Sodium: 139 mmol/L (ref 135–145)
Total Bilirubin: 0.9 mg/dL (ref 0.3–1.2)
Total Protein: 7.1 g/dL (ref 6.5–8.1)

## 2020-02-14 LAB — CBC WITH DIFFERENTIAL (CANCER CENTER ONLY)
Abs Immature Granulocytes: 0.01 10*3/uL (ref 0.00–0.07)
Basophils Absolute: 0 10*3/uL (ref 0.0–0.1)
Basophils Relative: 1 %
Eosinophils Absolute: 0.2 10*3/uL (ref 0.0–0.5)
Eosinophils Relative: 7 %
HCT: 38.1 % (ref 36.0–46.0)
Hemoglobin: 13.5 g/dL (ref 12.0–15.0)
Immature Granulocytes: 0 %
Lymphocytes Relative: 18 %
Lymphs Abs: 0.6 10*3/uL — ABNORMAL LOW (ref 0.7–4.0)
MCH: 30.5 pg (ref 26.0–34.0)
MCHC: 35.4 g/dL (ref 30.0–36.0)
MCV: 86.2 fL (ref 80.0–100.0)
Monocytes Absolute: 0.3 10*3/uL (ref 0.1–1.0)
Monocytes Relative: 10 %
Neutro Abs: 2.2 10*3/uL (ref 1.7–7.7)
Neutrophils Relative %: 64 %
Platelet Count: 151 10*3/uL (ref 150–400)
RBC: 4.42 MIL/uL (ref 3.87–5.11)
RDW: 11.9 % (ref 11.5–15.5)
WBC Count: 3.4 10*3/uL — ABNORMAL LOW (ref 4.0–10.5)
nRBC: 0 % (ref 0.0–0.2)

## 2020-02-14 NOTE — Progress Notes (Signed)
[facility] at [place] [place], [place] [address] [phone]   Interval Evaluation  Date of Service: 02/14/20 Patient Name: Candace [name] Patient MRN: [id number] Patient DOB: [birth year] Provider: Ventura Sellers, MD  Identifying Statement:  Candace [name] is a 39 y.o. female with left temporal grade 3 astrocytoma    Oncologic History: Oncology History  Astrocytoma of frontal lobe ([facility])  12/08/2019 Surgery   Stereotactic R temporal biopsy by Dr. Marcello Moores.  Path demonstrates Astrocytoma, IDH-mt, WHO grade 3   01/17/2020 -  Chemotherapy   The patient had temozolomide (TEMODAR) 20 MG capsule, 20 mg (100 % of original dose 20 mg), Oral, Daily, 0 of 1 cycle, Start date: 01/13/2020, End date: -- Dose modification: 20 mg (original dose 20 mg, Cycle 1) temozolomide (TEMODAR) 100 MG capsule, 100 mg (100 % of original dose 100 mg), Oral, Daily, 0 of 1 cycle, Start date: 01/13/2020, End date: -- Dose modification: 100 mg (original dose 100 mg, Cycle 1)  for chemotherapy treatment.      Biomarkers:  MGMT Unknown.  IDH 1/2 Mutated.  EGFR Unknown  TERT Unknown   Interval History:  Candace [name] present to clinic today now having completed four weeks of IMRT and Temozolomide.  No issues with Temodar the past two weeks.  She does describe some mild dizziness with vertigo qualities, typically positional. Otherwise fully independent and active.  H+P (12/23/19) Patient presented to medical attention early in September with first ever seizure.  Event was described as "sudden onset left arm numbness" followed by "face twisting, and loss of consciousness for at least 30 minutes".  CNS imaging demonstrated a large left frontal non enhancing mass consistent with primary brain tumor.  She underwent biopsy on 12/08/19 with Dr. Marcello Moores.  Following surgery she has some slurring of speech but no other significant complaints, returned to prior baseline upon  discharge.  Now home, she has had no breathrough events and no focal neurologic complaints.    Medications: Current Outpatient Medications on File Prior to Visit  Medication Sig Dispense Refill  . levETIRAcetam (KEPPRA) 750 MG tablet Take 1 tablet (750 mg total) by mouth 2 (two) times daily. 60 tablet 3  . MAGNESIUM PO Take 1 tablet by mouth daily.    . NON FORMULARY Take 1 Dose by mouth See admin instructions. [place] Mushroom    . NON FORMULARY Take 1 Dose by mouth See admin instructions. Frankincense    . NON FORMULARY Take 1 Dose by mouth See admin instructions. Berberine    . NON FORMULARY Take 1 Dose by mouth See admin instructions. Melantonin    . NON FORMULARY Take 1 Dose by mouth See admin instructions. Resveratrol    . NON FORMULARY Take 1 Dose by mouth See admin instructions. Green Tea Extract    . NON FORMULARY Take 1 Dose by mouth See admin instructions. Milk Thistle    . NON FORMULARY Take 1 Dose by mouth See admin instructions. Soy isoflavones    . NON FORMULARY Take 1 Dose by mouth See admin instructions. lycopene    . ondansetron (ZOFRAN) 8 MG tablet Take 1 tablet (8 mg total) by mouth every 8 (eight) hours as needed for nausea or vomiting. Take 30-59mn before temodar 60 tablet 1  . temozolomide (TEMODAR) 100 MG capsule Take 1 capsule (100 mg total) by mouth daily. May take on an empty stomach to decrease nausea & vomiting. 42 capsule 0  . temozolomide (TEMODAR) 20  MG capsule Take 1 capsule (20 mg total) by mouth daily. May take on an empty stomach to decrease nausea & vomiting. 42 capsule 0  . TURMERIC PO Take 1 tablet by mouth daily.    Marland Kitchen UNABLE TO FIND Take 1 Dose by mouth daily. Med Name: Mushroom Tincture; for immune support     No current facility-administered medications on file prior to visit.    Allergies:  Allergies  Allergen Reactions  . Penicillins Rash and Other (See Comments)    Pt does not recall so well   Past Medical History:  Past Medical History:    Diagnosis Date  . Headache    Past Surgical History:  Past Surgical History:  Procedure Laterality Date  . ABDOMINAL SURGERY    . APPLICATION OF CRANIAL NAVIGATION Left 12/08/2019   Procedure: APPLICATION OF CRANIAL NAVIGATION;  Surgeon: Vallarie Mare, MD;  Location: [facility];  Service: Neurosurgery;  Laterality: Left;  APPLICATION OF CRANIAL NAVIGATION  . FRAMELESS  BIOPSY WITH BRAINLAB Left 12/08/2019   Procedure: Left stereotactic brain biopsy with brainlab;  Surgeon: Vallarie Mare, MD;  Location: [facility];  Service: Neurosurgery;  Laterality: Left;  Left stereotactic brain biopsy with brainlab  . WISDOM TOOTH EXTRACTION     Social History:  Social History   Socioeconomic History  . Marital status: Married    Spouse name: Not on file  . Number of children: Not on file  . Years of education: Not on file  . Highest education level: Not on file  Occupational History  . Not on file  Tobacco Use  . Smoking status: Never Smoker  . Smokeless tobacco: Never Used  Substance and Sexual Activity  . Alcohol use: Not Currently  . Drug use: Never  . Sexual activity: Not on file  Other Topics Concern  . Not on file  Social History Narrative  . Not on file   Social Determinants of Health   Financial Resource Strain:   . Difficulty of Paying Living Expenses: Not on file  Food Insecurity:   . Worried About Charity fundraiser in the Last Year: Not on file  . Ran Out of Food in the Last Year: Not on file  Transportation Needs:   . Lack of Transportation (Medical): Not on file  . Lack of Transportation (Non-Medical): Not on file  Physical Activity:   . Days of Exercise per Week: Not on file  . Minutes of Exercise per Session: Not on file  Stress:   . Feeling of Stress : Not on file  Social Connections:   . Frequency of Communication with Friends and Family: Not on file  . Frequency of Social Gatherings with Friends and Family: Not on file  . Attends Religious Services: Not on  file  . Active Member of Clubs or Organizations: Not on file  . Attends Archivist Meetings: Not on file  . Marital Status: Not on file  Intimate Partner Violence:   . Fear of Current or Ex-Partner: Not on file  . Emotionally Abused: Not on file  . Physically Abused: Not on file  . Sexually Abused: Not on file   Family History:  Family History  Problem Relation Age of Onset  . Migraines Mother     Review of Systems: Constitutional: Doesn't report fevers, chills or abnormal weight loss Eyes: Doesn't report blurriness of vision Ears, nose, mouth, throat, and face: Doesn't report sore throat Respiratory: Doesn't report cough, dyspnea or wheezes Cardiovascular: Doesn't report  palpitation, chest discomfort  Gastrointestinal:  Doesn't report nausea, constipation, diarrhea GU: Doesn't report incontinence Skin: Doesn't report skin rashes Neurological: Per HPI Musculoskeletal: Doesn't report joint pain Behavioral/Psych: Doesn't report anxiety  Physical Exam: Vitals:   02/14/20 0857  BP: 112/74  Pulse: 62  Resp: 18  Temp: 97.7 F (36.5 C)  SpO2: 99%   KPS: 90. General: Alert, cooperative, pleasant, in no acute distress Head: Normal EENT: No conjunctival injection or scleral icterus.  Lungs: Resp effort normal Cardiac: Regular rate Abdomen: Non-distended abdomen Skin: No rashes cyanosis or petechiae. Extremities: No clubbing or edema  Neurologic Exam: Mental Status: Awake, alert, attentive to examiner. Oriented to self and environment. Language is fluent with intact comprehension.  Cranial Nerves: Visual acuity is grossly normal. Visual fields are full. Extra-ocular movements intact. No ptosis. Face is symmetric Motor: Tone and bulk are normal. Power is full in both arms and legs. Reflexes are symmetric, no pathologic reflexes present.  Sensory: Intact to light touch Gait: Normal.   Labs: I have reviewed the data as listed    Component Value Date/Time   NA  139 02/14/2020 0822   K 4.0 02/14/2020 0822   CL 107 02/14/2020 0822   CO2 25 02/14/2020 0822   GLUCOSE 90 02/14/2020 0822   BUN 15 02/14/2020 0822   CREATININE 1.03 (H) 02/14/2020 0822   CALCIUM 8.8 (L) 02/14/2020 0822   PROT 7.1 02/14/2020 0822   ALBUMIN 4.1 02/14/2020 0822   AST 21 02/14/2020 0822   ALT 26 02/14/2020 0822   ALKPHOS 43 02/14/2020 0822   BILITOT 0.9 02/14/2020 0822   GFRNONAA >60 02/14/2020 0822   GFRAA >60 12/09/2019 0623   Lab Results  Component Value Date   WBC 3.4 (L) 02/14/2020   NEUTROABS 2.2 02/14/2020   HGB 13.5 02/14/2020   HCT 38.1 02/14/2020   MCV 86.2 02/14/2020   PLT 151 02/14/2020    Assessment/Plan Astrocytoma of frontal lobe (HCC) [C71.1]  We appreciate the opportunity to participate in the care of Candace [name].  She is clinically stable today, having completed 4 weeks of IMRT+TMZ.  Mild vertiginous symptoms are not intrusive into daily functioning at this time; if worsens can consider meclizine or valium.  We  recommended continuing with course of intensity modulated radiation therapy and concurrent daily Temozolomide.  Radiation will be administered Mon-Fri over 6 weeks, Temodar will be dosed at 9m/m2 to be given daily over 42 days.  We reviewed side effects of temodar, including fatigue, nausea/vomiting, constipation, and cytopenias.  Chemotherapy should be held for the following:  ANC less than 1,000  Platelets less than 100,000  LFT or creatinine greater than 2x ULN  If clinical concerns/contraindications develop  Every 2 weeks during radiation, labs will be checked accompanied by a clinical evaluation in the brain tumor clinic.  Will continue Keppra 7542mBID.    All questions were answered. The patient knows to call the clinic with any problems, questions or concerns. No barriers to learning were detected.  The total time spent in the encounter was 30 minutes and more than 50% was on counseling and review of test  results   ZaVentura SellersMD Medical Director of Neuro-Oncology [facility] We[facility]1/16/21 9:03 AM

## 2020-02-15 ENCOUNTER — Ambulatory Visit
Admission: RE | Admit: 2020-02-15 | Discharge: 2020-02-15 | Disposition: A | Discharge status: Home / Self Care | Payer: Self-pay | Source: Ambulatory Visit | Attending: Radiation Oncology | Admitting: Radiation Oncology

## 2020-02-16 ENCOUNTER — Ambulatory Visit
Admission: RE | Admit: 2020-02-16 | Discharge: 2020-02-16 | Disposition: A | Discharge status: Home / Self Care | Payer: Self-pay | Source: Ambulatory Visit | Attending: Radiation Oncology | Admitting: Radiation Oncology

## 2020-02-17 ENCOUNTER — Ambulatory Visit
Admission: RE | Admit: 2020-02-17 | Discharge: 2020-02-17 | Disposition: A | Discharge status: Home / Self Care | Payer: Self-pay | Source: Ambulatory Visit | Attending: Radiation Oncology | Admitting: Radiation Oncology

## 2020-02-19 ENCOUNTER — Other Ambulatory Visit: Payer: Self-pay

## 2020-02-19 ENCOUNTER — Ambulatory Visit: Payer: Self-pay

## 2020-02-20 ENCOUNTER — Other Ambulatory Visit: Payer: Self-pay

## 2020-02-20 ENCOUNTER — Ambulatory Visit: Payer: Self-pay

## 2020-02-21 ENCOUNTER — Ambulatory Visit
Admission: RE | Admit: 2020-02-21 | Discharge: 2020-02-21 | Disposition: A | Discharge status: Home / Self Care | Payer: Self-pay | Source: Ambulatory Visit | Attending: Radiation Oncology | Admitting: Radiation Oncology

## 2020-02-22 ENCOUNTER — Ambulatory Visit
Admission: RE | Admit: 2020-02-22 | Discharge: 2020-02-22 | Disposition: A | Discharge status: Home / Self Care | Payer: Self-pay | Source: Ambulatory Visit | Attending: Radiation Oncology | Admitting: Radiation Oncology

## 2020-02-27 ENCOUNTER — Ambulatory Visit
Admission: RE | Admit: 2020-02-27 | Discharge: 2020-02-27 | Disposition: A | Discharge status: Home / Self Care | Payer: Self-pay | Source: Ambulatory Visit | Attending: Radiation Oncology | Admitting: Radiation Oncology

## 2020-02-28 ENCOUNTER — Ambulatory Visit
Admission: RE | Admit: 2020-02-28 | Discharge: 2020-02-28 | Disposition: A | Discharge status: Home / Self Care | Payer: Self-pay | Source: Ambulatory Visit | Attending: Radiation Oncology | Admitting: Radiation Oncology

## 2020-02-28 ENCOUNTER — Other Ambulatory Visit: Payer: Self-pay

## 2020-02-28 ENCOUNTER — Inpatient Hospital Stay (HOSPITAL_BASED_OUTPATIENT_CLINIC_OR_DEPARTMENT_OTHER): Payer: Self-pay | Admitting: Internal Medicine

## 2020-02-28 ENCOUNTER — Inpatient Hospital Stay: Payer: Self-pay

## 2020-02-28 VITALS — BP 118/82 | HR 68 | Temp 98.4°F | Resp 18 | Ht 68.0 in | Wt 133.9 lb

## 2020-02-28 DIAGNOSIS — C711 Malignant neoplasm of frontal lobe: Secondary | ICD-10-CM

## 2020-02-28 DIAGNOSIS — R569 Unspecified convulsions: Secondary | ICD-10-CM

## 2020-02-28 LAB — CBC WITH DIFFERENTIAL (CANCER CENTER ONLY)
Abs Immature Granulocytes: 0.01 10*3/uL (ref 0.00–0.07)
Basophils Absolute: 0 10*3/uL (ref 0.0–0.1)
Basophils Relative: 0 %
Eosinophils Absolute: 0.2 10*3/uL (ref 0.0–0.5)
Eosinophils Relative: 9 %
HCT: 32.6 % — ABNORMAL LOW (ref 36.0–46.0)
Hemoglobin: 12 g/dL (ref 12.0–15.0)
Immature Granulocytes: 0 %
Lymphocytes Relative: 14 %
Lymphs Abs: 0.3 10*3/uL — ABNORMAL LOW (ref 0.7–4.0)
MCH: 31.3 pg (ref 26.0–34.0)
MCHC: 36.8 g/dL — ABNORMAL HIGH (ref 30.0–36.0)
MCV: 84.9 fL (ref 80.0–100.0)
Monocytes Absolute: 0.3 10*3/uL (ref 0.1–1.0)
Monocytes Relative: 12 %
Neutro Abs: 1.5 10*3/uL — ABNORMAL LOW (ref 1.7–7.7)
Neutrophils Relative %: 65 %
Platelet Count: 42 10*3/uL — ABNORMAL LOW (ref 150–400)
RBC: 3.84 MIL/uL — ABNORMAL LOW (ref 3.87–5.11)
RDW: 12.6 % (ref 11.5–15.5)
WBC Count: 2.3 10*3/uL — ABNORMAL LOW (ref 4.0–10.5)
nRBC: 0 % (ref 0.0–0.2)

## 2020-02-28 LAB — CMP (CANCER CENTER ONLY)
ALT: 27 U/L (ref 0–44)
AST: 18 U/L (ref 15–41)
Albumin: 3.9 g/dL (ref 3.5–5.0)
Alkaline Phosphatase: 45 U/L (ref 38–126)
Anion gap: 6 (ref 5–15)
BUN: 15 mg/dL (ref 6–20)
CO2: 24 mmol/L (ref 22–32)
Calcium: 9.3 mg/dL (ref 8.9–10.3)
Chloride: 108 mmol/L (ref 98–111)
Creatinine: 1.07 mg/dL — ABNORMAL HIGH (ref 0.44–1.00)
GFR, Estimated: >60 mL/min (ref >60)
Glucose, Bld: 95 mg/dL (ref 70–99)
Potassium: 4.6 mmol/L (ref 3.5–5.1)
Sodium: 138 mmol/L (ref 135–145)
Total Bilirubin: 1 mg/dL (ref 0.3–1.2)
Total Protein: 6.8 g/dL (ref 6.5–8.1)

## 2020-02-28 NOTE — Progress Notes (Signed)
[facility] [place], [place] [address] [phone]   Interval Evaluation  Date of Service: 02/28/20 Patient Name: Candace [name] Patient MRN: [id number] Patient DOB: [birth year] Provider: Ventura Sellers, MD  Identifying Statement:  Candace [name] is a 39 y.o. female with left temporal grade 3 astrocytoma    Oncologic History: Oncology History  Astrocytoma of frontal lobe ([place])  12/08/2019 Surgery   Stereotactic R temporal biopsy by Dr. Marcello Moores.  Path demonstrates Astrocytoma, IDH-mt, WHO grade 3   01/17/2020 -  Chemotherapy   The patient had temozolomide (TEMODAR) 20 MG capsule, 20 mg (100 % of original dose 20 mg), Oral, Daily, 0 of 1 cycle, Start date: 01/13/2020, End date: -- Dose modification: 20 mg (original dose 20 mg, Cycle 1) temozolomide (TEMODAR) 100 MG capsule, 100 mg (100 % of original dose 100 mg), Oral, Daily, 0 of 1 cycle, Start date: 01/13/2020, End date: -- Dose modification: 100 mg (original dose 100 mg, Cycle 1)  for chemotherapy treatment.      Biomarkers:  MGMT Unknown.  IDH 1/2 Mutated.  EGFR Unknown  TERT Unknown   Interval History:  Candace [name] present to clinic today now having completed four weeks of IMRT and Temozolomide.  No issues with Temodar the past two weeks.  She does describe some mild dizziness with vertigo qualities, typically positional. Otherwise fully independent and active.  H+P (12/23/19) Patient presented to medical attention early in September with first ever seizure.  Event was described as "sudden onset left arm numbness" followed by "face twisting, and loss of consciousness for at least 30 minutes".  CNS imaging demonstrated a large left frontal non enhancing mass consistent with primary brain tumor.  She underwent biopsy on 12/08/19 with Dr. Marcello Moores.  Following surgery she has some slurring of speech but no other significant complaints, returned to prior baseline upon  discharge.  Now home, she has had no breathrough events and no focal neurologic complaints.    Medications: Current Outpatient Medications on File Prior to Visit  Medication Sig Dispense Refill  . levETIRAcetam (KEPPRA) 750 MG tablet Take 1 tablet (750 mg total) by mouth 2 (two) times daily. 60 tablet 3  . NON FORMULARY Take 1 Dose by mouth See admin instructions. [place] Mushroom    . NON FORMULARY Take 1 Dose by mouth See admin instructions. Melantonin    . ondansetron (ZOFRAN) 8 MG tablet Take 1 tablet (8 mg total) by mouth every 8 (eight) hours as needed for nausea or vomiting. Take 30-35mn before temodar 60 tablet 1  . temozolomide (TEMODAR) 100 MG capsule Take 1 capsule (100 mg total) by mouth daily. May take on an empty stomach to decrease nausea & vomiting. 42 capsule 0  . temozolomide (TEMODAR) 20 MG capsule Take 1 capsule (20 mg total) by mouth daily. May take on an empty stomach to decrease nausea & vomiting. 42 capsule 0  . TURMERIC PO Take 1 tablet by mouth daily.    .Marland KitchenMAGNESIUM PO Take 1 tablet by mouth daily. (Patient not taking: Reported on 02/28/2020)    . NON FORMULARY Take 1 Dose by mouth See admin instructions. Frankincense (Patient not taking: Reported on 02/28/2020)    . NON FORMULARY Take 1 Dose by mouth See admin instructions. Berberine (Patient not taking: Reported on 02/28/2020)    . NON FORMULARY Take 1 Dose by mouth See admin instructions. Resveratrol (Patient not taking: Reported on 02/28/2020)    . NON  FORMULARY Take 1 Dose by mouth See admin instructions. Green Tea Extract (Patient not taking: Reported on 02/28/2020)    . NON FORMULARY Take 1 Dose by mouth See admin instructions. Milk Thistle (Patient not taking: Reported on 02/28/2020)    . NON FORMULARY Take 1 Dose by mouth See admin instructions. Soy isoflavones (Patient not taking: Reported on 02/28/2020)    . NON FORMULARY Take 1 Dose by mouth See admin instructions. lycopene (Patient not taking: Reported on  02/28/2020)    . UNABLE TO FIND Take 1 Dose by mouth daily. Med Name: Mushroom Tincture; for immune support (Patient not taking: Reported on 02/28/2020)     No current facility-administered medications on file prior to visit.    Allergies:  Allergies  Allergen Reactions  . Penicillins Rash and Other (See Comments)    Pt does not recall so well   Past Medical History:  Past Medical History:  Diagnosis Date  . Headache    Past Surgical History:  Past Surgical History:  Procedure Laterality Date  . ABDOMINAL SURGERY    . APPLICATION OF CRANIAL NAVIGATION Left 12/08/2019   Procedure: APPLICATION OF CRANIAL NAVIGATION;  Surgeon: Vallarie Mare, MD;  Location: [facility];  Service: Neurosurgery;  Laterality: Left;  APPLICATION OF CRANIAL NAVIGATION  . FRAMELESS  BIOPSY WITH BRAINLAB Left 12/08/2019   Procedure: Left stereotactic brain biopsy with brainlab;  Surgeon: Vallarie Mare, MD;  Location: [facility];  Service: Neurosurgery;  Laterality: Left;  Left stereotactic brain biopsy with brainlab  . WISDOM TOOTH EXTRACTION     Social History:  Social History   Socioeconomic History  . Marital status: Married    Spouse name: Not on file  . Number of children: Not on file  . Years of education: Not on file  . Highest education level: Not on file  Occupational History  . Not on file  Tobacco Use  . Smoking status: Never Smoker  . Smokeless tobacco: Never Used  Substance and Sexual Activity  . Alcohol use: Not Currently  . Drug use: Never  . Sexual activity: Not on file  Other Topics Concern  . Not on file  Social History Narrative  . Not on file   Social Determinants of Health   Financial Resource Strain:   . Difficulty of Paying Living Expenses: Not on file  Food Insecurity:   . Worried About Charity fundraiser in the Last Year: Not on file  . Ran Out of Food in the Last Year: Not on file  Transportation Needs:   . Lack of Transportation (Medical): Not on file  . Lack of  Transportation (Non-Medical): Not on file  Physical Activity:   . Days of Exercise per Week: Not on file  . Minutes of Exercise per Session: Not on file  Stress:   . Feeling of Stress : Not on file  Social Connections:   . Frequency of Communication with Friends and Family: Not on file  . Frequency of Social Gatherings with Friends and Family: Not on file  . Attends Religious Services: Not on file  . Active Member of Clubs or Organizations: Not on file  . Attends Archivist Meetings: Not on file  . Marital Status: Not on file  Intimate Partner Violence:   . Fear of Current or Ex-Partner: Not on file  . Emotionally Abused: Not on file  . Physically Abused: Not on file  . Sexually Abused: Not on file   Family History:  Family  History  Problem Relation Age of Onset  . Migraines Mother     Review of Systems: Constitutional: Doesn't report fevers, chills or abnormal weight loss Eyes: Doesn't report blurriness of vision Ears, nose, mouth, throat, and face: Doesn't report sore throat Respiratory: Doesn't report cough, dyspnea or wheezes Cardiovascular: Doesn't report palpitation, chest discomfort  Gastrointestinal:  Doesn't report nausea, constipation, diarrhea GU: Doesn't report incontinence Skin: Doesn't report skin rashes Neurological: Per HPI Musculoskeletal: Doesn't report joint pain Behavioral/Psych: Doesn't report anxiety  Physical Exam: Vitals:   02/28/20 0846  BP: 118/82  Pulse: 68  Resp: 18  Temp: 98.4 F (36.9 C)  SpO2: 100%   KPS: 90. General: Alert, cooperative, pleasant, in no acute distress Head: Normal EENT: No conjunctival injection or scleral icterus.  Lungs: Resp effort normal Cardiac: Regular rate Abdomen: Non-distended abdomen Skin: No rashes cyanosis or petechiae. Extremities: No clubbing or edema  Neurologic Exam: Mental Status: Awake, alert, attentive to examiner. Oriented to self and environment. Language is fluent with intact  comprehension.  Cranial Nerves: Visual acuity is grossly normal. Visual fields are full. Extra-ocular movements intact. No ptosis. Face is symmetric Motor: Tone and bulk are normal. Power is full in both arms and legs. Reflexes are symmetric, no pathologic reflexes present.  Sensory: Intact to light touch Gait: Normal.   Labs: I have reviewed the data as listed    Component Value Date/Time   NA 138 02/28/2020 0811   K 4.6 02/28/2020 0811   CL 108 02/28/2020 0811   CO2 24 02/28/2020 0811   GLUCOSE 95 02/28/2020 0811   BUN 15 02/28/2020 0811   CREATININE 1.07 (H) 02/28/2020 0811   CALCIUM 9.3 02/28/2020 0811   PROT 6.8 02/28/2020 0811   ALBUMIN 3.9 02/28/2020 0811   AST 18 02/28/2020 0811   ALT 27 02/28/2020 0811   ALKPHOS 45 02/28/2020 0811   BILITOT 1.0 02/28/2020 0811   GFRNONAA >60 02/28/2020 0811   GFRAA >60 12/09/2019 0623   Lab Results  Component Value Date   WBC 2.3 (L) 02/28/2020   NEUTROABS 1.5 (L) 02/28/2020   HGB 12.0 02/28/2020   HCT 32.6 (L) 02/28/2020   MCV 84.9 02/28/2020   PLT 42 (L) 02/28/2020    Assessment/Plan Astrocytoma of frontal lobe (HCC) [C71.1]  We appreciate the opportunity to participate in the care of Dinah Beers.  She is clinically stable today, having completed 6 weeks of IMRT+TMZ.  No new or progressive deficits today.  Labs demonstrate thrombocytopenia as effect of Temozolomide.  We  recommended continuing and completing course of intensity modulated radiation therapy but without Temozolomide due to cytopenias.  Radiation will be administered Mon-Fri over 6 weeks.  Chemotherapy should be held for the following:  ANC less than 1,000  Platelets less than 100,000  LFT or creatinine greater than 2x ULN  If clinical concerns/contraindications develop  Will continue Keppra 768m BID.    We ask that JTammy Wickliffereturn to clinic in 1 months following next brain MRI, or sooner as needed.  All questions were answered. The  patient knows to call the clinic with any problems, questions or concerns. No barriers to learning were detected.  The total time spent in the encounter was 30 minutes and more than 50% was on counseling and review of test results   ZVentura Sellers MD Medical Director of Neuro-Oncology [facility] [place]11/30/21 2:27 PM

## 2020-02-29 ENCOUNTER — Ambulatory Visit
Admission: RE | Admit: 2020-02-29 | Discharge: 2020-02-29 | Disposition: A | Discharge status: Home / Self Care | Payer: Self-pay | Source: Ambulatory Visit | Attending: Radiation Oncology | Admitting: Radiation Oncology

## 2020-02-29 DIAGNOSIS — Z510 Encounter for antineoplastic radiation therapy: Secondary | ICD-10-CM | POA: Insufficient documentation

## 2020-02-29 DIAGNOSIS — C711 Malignant neoplasm of frontal lobe: Secondary | ICD-10-CM | POA: Insufficient documentation

## 2020-03-01 ENCOUNTER — Ambulatory Visit
Admission: RE | Admit: 2020-03-01 | Discharge: 2020-03-01 | Disposition: A | Discharge status: Home / Self Care | Payer: Self-pay | Source: Ambulatory Visit | Attending: Radiation Oncology | Admitting: Radiation Oncology

## 2020-03-02 ENCOUNTER — Ambulatory Visit
Admission: RE | Admit: 2020-03-02 | Discharge: 2020-03-02 | Disposition: A | Discharge status: Home / Self Care | Payer: Self-pay | Source: Ambulatory Visit | Attending: Radiation Oncology | Admitting: Radiation Oncology

## 2020-03-02 ENCOUNTER — Encounter: Payer: Self-pay | Admitting: Radiation Oncology

## 2020-03-05 ENCOUNTER — Ambulatory Visit: Payer: Self-pay

## 2020-03-15 NOTE — Progress Notes (Signed)
Candace [name] presents today for follow-up after completing radiation to her brain/frontal lobe on 03/02/2020  Dose of Decadron, if applicable:none  Recent neurologic symptoms, if any:   Seizures: no  Headaches: one time after last radiation tx  Nausea: intermittent  Dizziness/ataxia: no  Difficulty with hand coordination: no  Focal numbness/weakness: right side of mouth droops and has occ drooling per pt.  Visual deficits/changes: has had a few days of blurriness and then it goes away.  Confusion/Memory deficits: has occ problems finding the right words and forming them. States she is focusing on getting right and left correct like when giving directions.  Other issues of note: Scheduled for MRI of brain on 04/04/2019 and F/U with Dr. Cecil Cobbs on 04/10/2019  BP 120/72   Pulse 65   Temp 97.7 F (36.5 C) (Temporal)   Resp 20   Ht 5\' 8"  (1.727 m)   Wt 132 lb 12.8 oz (60.2 kg)   SpO2 100%   BMI 20.19 kg/m    Wt Readings from Last 3 Encounters:  03/16/20 132 lb 12.8 oz (60.2 kg)  02/28/20 133 lb 14.4 oz (60.7 kg)  02/14/20 135 lb 9.6 oz (61.5 kg)

## 2020-03-16 ENCOUNTER — Inpatient Hospital Stay: Payer: Self-pay | Attending: Internal Medicine

## 2020-03-16 ENCOUNTER — Other Ambulatory Visit: Payer: Self-pay

## 2020-03-16 ENCOUNTER — Encounter: Payer: Self-pay | Admitting: Radiation Oncology

## 2020-03-16 ENCOUNTER — Ambulatory Visit
Admission: RE | Admit: 2020-03-16 | Discharge: 2020-03-16 | Disposition: A | Discharge status: Home / Self Care | Payer: Self-pay | Source: Ambulatory Visit | Attending: Radiation Oncology | Admitting: Radiation Oncology

## 2020-03-16 VITALS — BP 120/72 | HR 65 | Temp 97.7°F | Resp 20 | Ht 68.0 in | Wt 132.8 lb

## 2020-03-16 DIAGNOSIS — Z923 Personal history of irradiation: Secondary | ICD-10-CM | POA: Insufficient documentation

## 2020-03-16 DIAGNOSIS — Z79899 Other long term (current) drug therapy: Secondary | ICD-10-CM | POA: Insufficient documentation

## 2020-03-16 DIAGNOSIS — C711 Malignant neoplasm of frontal lobe: Secondary | ICD-10-CM | POA: Insufficient documentation

## 2020-03-16 LAB — CBC WITH DIFFERENTIAL (CANCER CENTER ONLY)
Abs Immature Granulocytes: 0 10*3/uL (ref 0.00–0.07)
Basophils Absolute: 0 10*3/uL (ref 0.0–0.1)
Basophils Relative: 1 %
Eosinophils Absolute: 0.1 10*3/uL (ref 0.0–0.5)
Eosinophils Relative: 3 %
HCT: 31.1 % — ABNORMAL LOW (ref 36.0–46.0)
Hemoglobin: 11.1 g/dL — ABNORMAL LOW (ref 12.0–15.0)
Immature Granulocytes: 0 %
Lymphocytes Relative: 21 %
Lymphs Abs: 0.4 10*3/uL — ABNORMAL LOW (ref 0.7–4.0)
MCH: 32.4 pg (ref 26.0–34.0)
MCHC: 35.7 g/dL (ref 30.0–36.0)
MCV: 90.7 fL (ref 80.0–100.0)
Monocytes Absolute: 0.3 10*3/uL (ref 0.1–1.0)
Monocytes Relative: 16 %
Neutro Abs: 1.2 10*3/uL — ABNORMAL LOW (ref 1.7–7.7)
Neutrophils Relative %: 59 %
Platelet Count: 155 10*3/uL (ref 150–400)
RBC: 3.43 MIL/uL — ABNORMAL LOW (ref 3.87–5.11)
RDW: 17.7 % — ABNORMAL HIGH (ref 11.5–15.5)
WBC Count: 1.9 10*3/uL — ABNORMAL LOW (ref 4.0–10.5)
nRBC: 0 % (ref 0.0–0.2)

## 2020-03-16 LAB — CMP (CANCER CENTER ONLY)
ALT: 40 U/L (ref 0–44)
AST: 26 U/L (ref 15–41)
Albumin: 4.1 g/dL (ref 3.5–5.0)
Alkaline Phosphatase: 46 U/L (ref 38–126)
Anion gap: 7 (ref 5–15)
BUN: 16 mg/dL (ref 6–20)
CO2: 24 mmol/L (ref 22–32)
Calcium: 9.1 mg/dL (ref 8.9–10.3)
Chloride: 109 mmol/L (ref 98–111)
Creatinine: 1.08 mg/dL — ABNORMAL HIGH (ref 0.44–1.00)
GFR, Estimated: >60 mL/min (ref >60)
Glucose, Bld: 95 mg/dL (ref 70–99)
Potassium: 3.9 mmol/L (ref 3.5–5.1)
Sodium: 140 mmol/L (ref 135–145)
Total Bilirubin: 0.7 mg/dL (ref 0.3–1.2)
Total Protein: 7.1 g/dL (ref 6.5–8.1)

## 2020-03-16 NOTE — Progress Notes (Signed)
Radiation Oncology         [phone] ________________________________  Name: Candace [name] MRN: [id number]  Date: 03/16/2020  DOB: April 06, 1980  Follow-Up Visit Note  Outpatient  CC: Patient, No Pcp Per  Candace Sellers, MD  Diagnosis and Prior Radiotherapy:    ICD-10-CM   1. Astrocytoma of frontal lobe (HCC)  C71.1   2. Primary malignant neoplasm of frontal lobe ([place])  C71.1     CHIEF COMPLAINT: Here for follow-up and surveillance of brain cancer  Narrative:  The patient returns today for routine follow-up.   Candace [name] presents today for follow-up after completing radiation to her brain/frontal lobe on 03/02/2020  Dose of Decadron, if applicable:none  Recent neurologic symptoms, if any:   Seizures: no  Headaches: one time after last radiation tx  Nausea: intermittent  Dizziness/ataxia: no  Difficulty with hand coordination: no  Focal numbness/weakness: right side of mouth droops and has occ drooling per pt - stable to new post op baseline and subtle, husband does not notice.  Visual deficits/changes: has had a few days of blurriness and then it goes away.  Confusion/Memory deficits: has occ problems finding the right words and forming them. States she is focusing on getting right and left correct like when giving directions. More thirsty than usual, and when she is thirsty it's intense but satisfied by a tall glass of water. Urinating more, not excessively.  Other issues of note: Scheduled for MRI of brain on 04/04/2019 and F/U with Dr. Cecil [name] on 04/10/2019  BP 120/72   Pulse 65   Temp 97.7 F (36.5 C) (Temporal)   Resp 20   Ht 5\' 8"  (1.727 m)   Wt 132 lb 12.8 oz (60.2 kg)   SpO2 100%   BMI 20.19 kg/m    Wt Readings from Last 3 Encounters:  03/16/20 132 lb 12.8 oz (60.2 kg)  02/28/20 133 lb 14.4 oz (60.7 kg)  02/14/20 135 lb 9.6 oz (61.5 kg)                                   ALLERGIES:  is allergic to  penicillins.  Meds: Current Outpatient Medications  Medication Sig Dispense Refill  . TURMERIC PO Take 1 tablet by mouth daily.    Marland Kitchen levETIRAcetam (KEPPRA) 750 MG tablet Take 1 tablet (750 mg total) by mouth 2 (two) times daily. 60 tablet 3  . MAGNESIUM PO Take 1 tablet by mouth daily. (Patient not taking: Reported on 02/28/2020)    . NON FORMULARY Take 1 Dose by mouth See admin instructions. [place] Mushroom    . NON FORMULARY Take 1 Dose by mouth See admin instructions. Frankincense (Patient not taking: Reported on 02/28/2020)    . NON FORMULARY Take 1 Dose by mouth See admin instructions. Berberine (Patient not taking: Reported on 02/28/2020)    . NON FORMULARY Take 1 Dose by mouth See admin instructions. Melantonin    . NON FORMULARY Take 1 Dose by mouth See admin instructions. Resveratrol (Patient not taking: Reported on 02/28/2020)    . NON FORMULARY Take 1 Dose by mouth See admin instructions. Green Tea Extract (Patient not taking: Reported on 02/28/2020)    . NON FORMULARY Take 1 Dose by mouth See admin instructions. Milk Thistle (Patient not taking: Reported on 02/28/2020)    . NON FORMULARY Take 1 Dose by mouth See admin instructions. Soy isoflavones (Patient not taking: Reported on  02/28/2020)    . NON FORMULARY Take 1 Dose by mouth See admin instructions. lycopene (Patient not taking: Reported on 02/28/2020)    . ondansetron (ZOFRAN) 8 MG tablet Take 1 tablet (8 mg total) by mouth every 8 (eight) hours as needed for nausea or vomiting. Take 30-39min before temodar (Patient not taking: Reported on 03/16/2020) 60 tablet 1  . temozolomide (TEMODAR) 100 MG capsule Take 1 capsule (100 mg total) by mouth daily. May take on an empty stomach to decrease nausea & vomiting. (Patient not taking: Reported on 03/16/2020) 42 capsule 0  . temozolomide (TEMODAR) 20 MG capsule Take 1 capsule (20 mg total) by mouth daily. May take on an empty stomach to decrease nausea & vomiting. (Patient not taking:  Reported on 03/16/2020) 42 capsule 0  . UNABLE TO FIND Take 1 Dose by mouth daily. Med Name: Mushroom Tincture; for immune support (Patient not taking: No sig reported)     No current facility-administered medications for this encounter.    Physical Findings: The patient is in no acute distress. Patient is alert and oriented.  height is 5\' 8"  (1.727 m) and weight is 132 lb 12.8 oz (60.2 kg). Her temporal temperature is 97.7 F (36.5 C). Her blood pressure is 120/72 and her pulse is 65. Her respiration is 20 and oxygen saturation is 100%. .    Neck without any palpable adenopathy Throat without any thrush.  Mouth without any thrush. Extraocular movements are intact.  Strength is symmetric throughout all extremities.  She is alert and oriented.  Speech is fluent.  Lab Findings: Lab Results  Component Value Date   WBC 1.9 (L) 03/16/2020   HGB 11.1 (L) 03/16/2020   HCT 31.1 (L) 03/16/2020   MCV 90.7 03/16/2020   PLT 155 03/16/2020    Radiographic Findings: No results found.  Impression/Plan:   She is healing from radiation therapy.  Lab work reviewed today as ordered by medical oncology.  Her electrolytes do not show any obvious imbalances but I did notify Candace [name] of her new onset  thirst which is out of the ordinary for her.  She will let us know if the symptoms become more noticeable.  Otherwise she will follow up in a couple weeks with Candace [name] after her restaging MRI.  She is pleased with this plan.  We discussed measures to reduce the risk of infection during the COVID-19 pandemic.  She has not yet received any Covid vaccines.  She is adamant that she does not wish to get vaccinated.  I discussed the rationale for getting vaccinated and let her know I strongly recommend it.  We talked about the contagiousness of the recent variants and the rising cases in the state as well as the risk of hospitalization if she does get infected with a depressed immune system.  She knows to contact  us if she changes her mind and wants to get a vaccine here.  Otherwise she knows how to get a vaccine at a drugstore in the community.  I wished her the very, very best.  I will see her back on an as-needed basis.  On date of service, in total, I spent 25 minutes on this encounter. Patient was seen in person.  _____________________________________   Eppie Gibson, MD

## 2020-03-22 NOTE — Progress Notes (Signed)
  Patient Name: Candace [name] MRN: [id number] DOB: [birth year] Referring Physician: Cecil Cobbs Date of Service: 03/02/2020 [facility],                                                         End Of Treatment Note  Diagnoses: C71.1-Malignant neoplasm of frontal lobe  Astrocytoma, IDH-mutant, WHO grade 3.  Intent: Curative  Radiation Treatment Dates: 01/17/2020 through 03/02/2020 Site Technique Total Dose (Gy) Dose per Fx (Gy) Completed Fx Beam Energies  Frontal Lobe: Brain_initial IMRT 46.8/46.8 1.8 26/26 6X  Frontal Lobe: Brain_Bst IMRT 12.6/12.6 1.8 7/7 6X   Narrative: The patient tolerated radiation therapy relatively well with fatigue and partial scalp alopecia.  Plan: The patient will follow-up with radiation oncology in 2 weeks.  -----------------------------------  Eppie Gibson, MD

## 2020-04-03 ENCOUNTER — Ambulatory Visit
Admission: RE | Admit: 2020-04-03 | Discharge: 2020-04-03 | Disposition: A | Discharge status: Home / Self Care | Payer: BC Managed Care – PPO | Source: Ambulatory Visit | Attending: Internal Medicine | Admitting: Internal Medicine

## 2020-04-03 DIAGNOSIS — C711 Malignant neoplasm of frontal lobe: Secondary | ICD-10-CM

## 2020-04-03 IMAGING — MR MR HEAD WO/W CM
12 series · 48 of 48 positions shown · IV contrast (12 ML MULTIHANCE)
Comparison: MRI head with contrast [DATE]

CLINICAL DATA: Grade 3 astrocytoma left frontal lobe. Post biopsy
and radiation.

EXAM:
MRI HEAD WITHOUT AND WITH CONTRAST
TECHNIQUE: Multiplanar, multiecho pulse sequences of the brain and surrounding
structures were obtained without and with intravenous contrast.
CONTRAST:  12mL MULTIHANCE GADOBENATE DIMEGLUMINE 529 MG/ML IV SOLN

[Series 9: T1 · sagittal · 4.0mm · 0.75mm/px · 1 of 29 slices shown (1 of 3)]
[im 1/29]
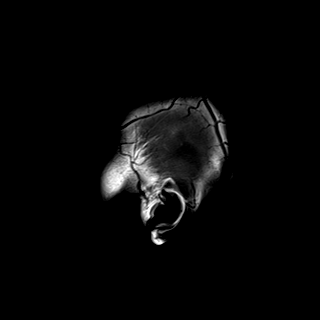

[Series 10: DWI · axial · 3.0mm · 0.94mm/px · z∈[-85,+61]mm · 9 of 160 slices shown]
[im 1/160]
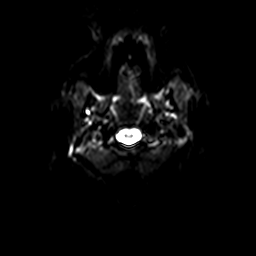
[im 20/160]
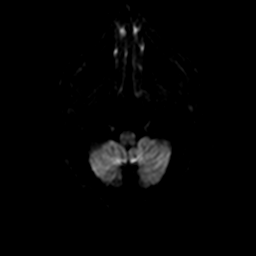
[im 40/160]
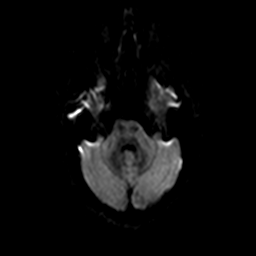
[im 60/160]
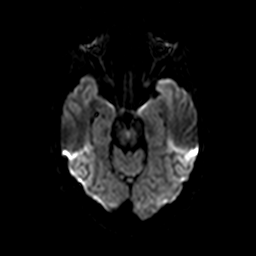
[im 80/160]
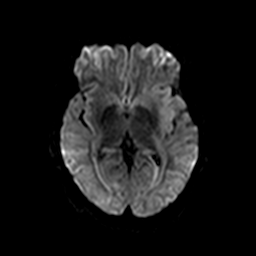
[im 100/160]
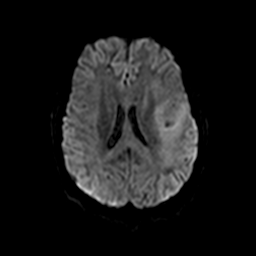
[im 120/160]
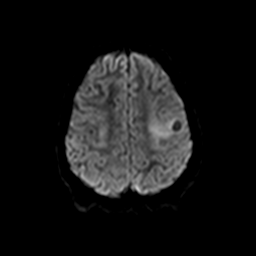
[im 140/160]
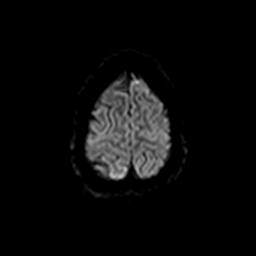
[im 160/160]
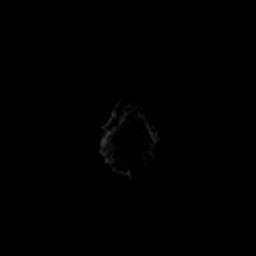

[Series 11: ax dwi_tracew · axial · 3.0mm · 0.94mm/px · z∈[-85,+61]mm · 5 of 80 slices shown]
[im 1/80]
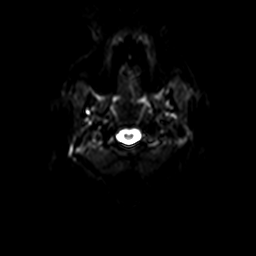
[im 20/80]
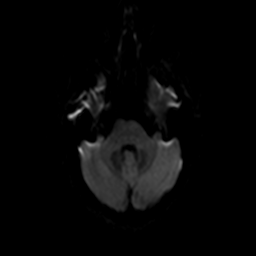
[im 40/80]
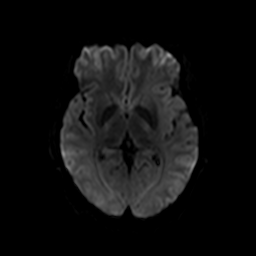
[im 60/80]
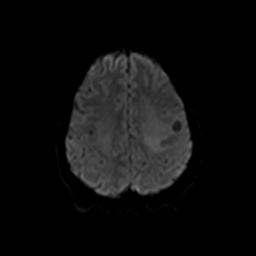
[im 80/80]
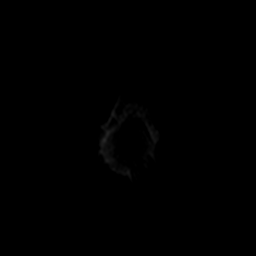

[Series 12: ax dwi_adc · axial · 3.0mm · 0.94mm/px · z∈[-85,+61]mm · 2 of 40 slices shown]
[im 1/40]
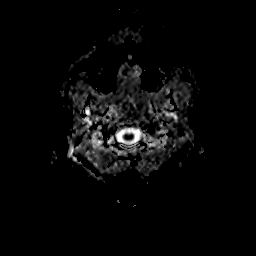
[im 40/40]
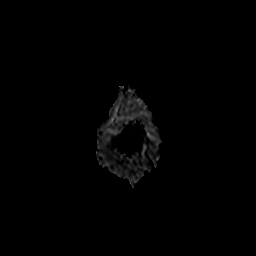

[Series 13: T2 · axial · 4.0mm · 0.36mm/px · z∈[-84,+61]mm · 2 of 29 slices shown]
[im 1/29]
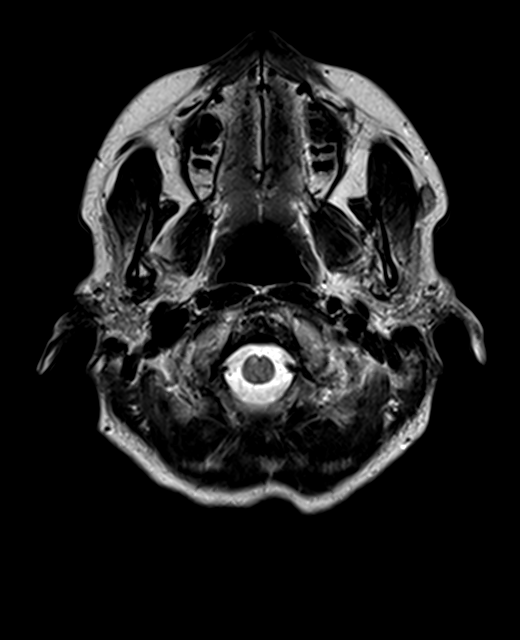
[im 29/29]
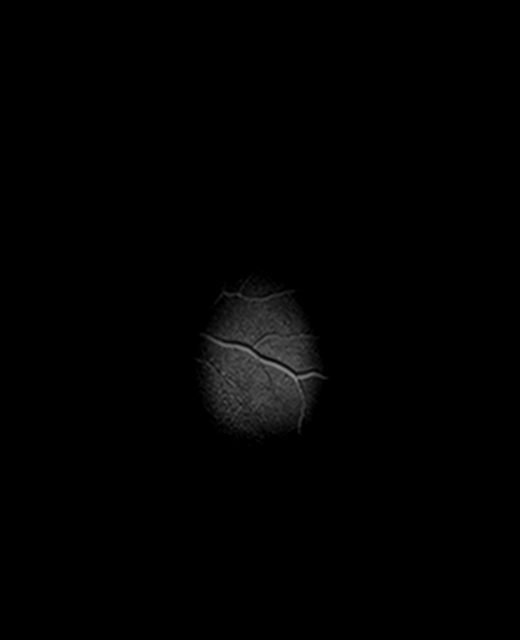

[Series 14: FLAIR · axial · 3.0mm · 0.72mm/px · 1 of 26 slices shown]
[im 1/26]
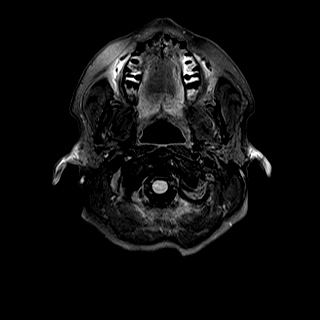

[Series 16: swi_images · axial · 2.2mm · 0.90mm/px · z∈[-81,+57]mm · 4 of 64 slices shown]
[im 1/64]
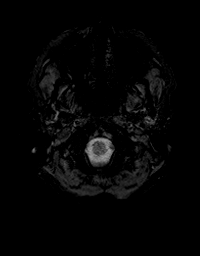
[im 22/64]
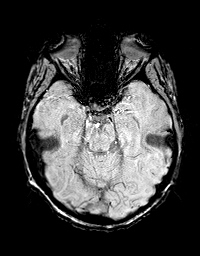
[im 43/64]
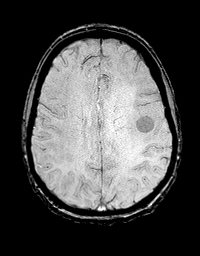
[im 64/64]
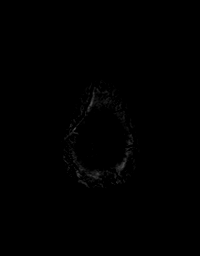

[Series 17: T1 · axial · 1.0mm · 0.94mm/px · z∈[-91,+68]mm · 9 of 160 slices shown (2 of 3)]
[im 1/160]
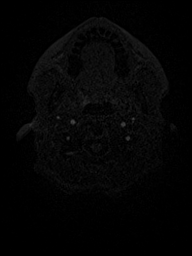
[im 20/160]
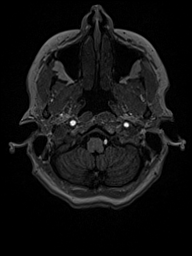
[im 40/160]
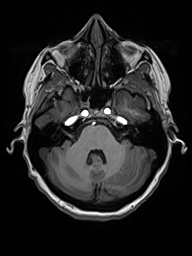
[im 60/160]
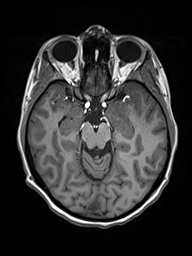
[im 80/160]
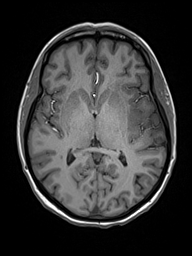
[im 100/160]
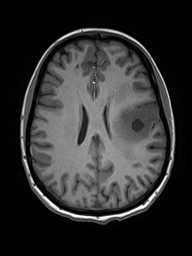
[im 120/160]
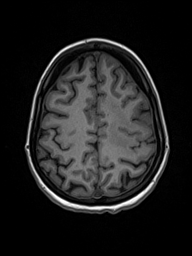
[im 140/160]
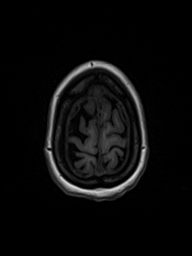
[im 160/160]
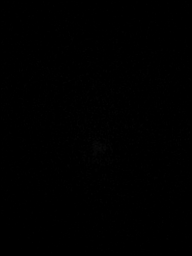

[Series 18: T2 post-contrast · coronal · 4.0mm · 0.36mm/px · 2 of 34 slices shown]
[im 1/34]
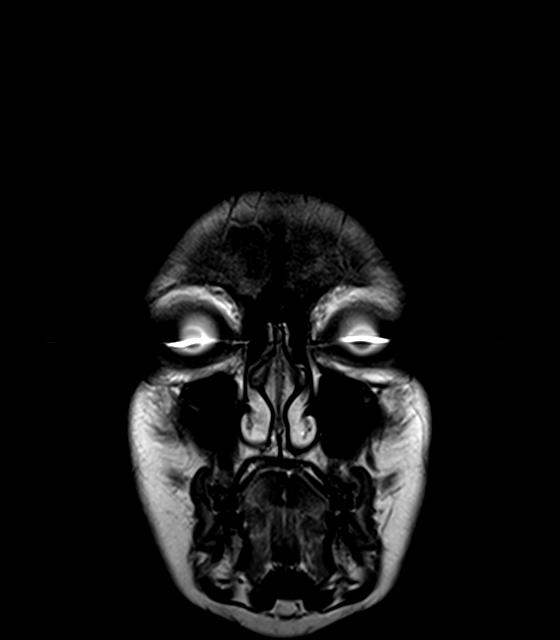
[im 34/34]
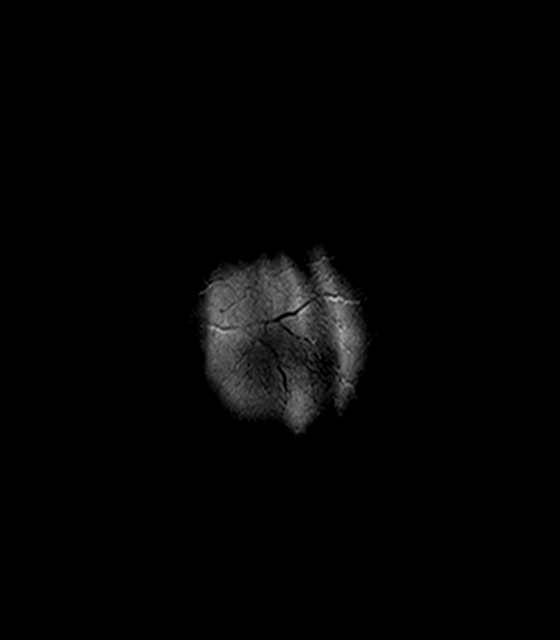

[Series 19: T1 · axial · 1.0mm · 0.94mm/px · z∈[-91,+68]mm · 9 of 160 slices shown (3 of 3)]
[im 1/160]
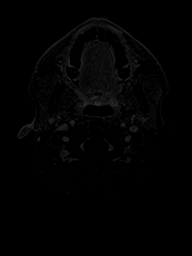
[im 20/160]
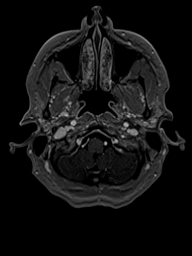
[im 40/160]
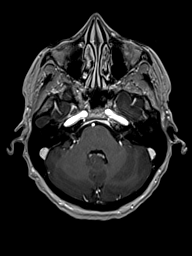
[im 60/160]
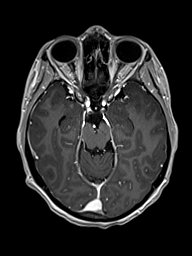
[im 80/160]
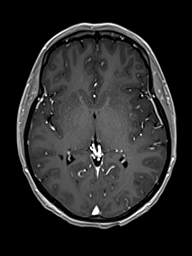
[im 100/160]
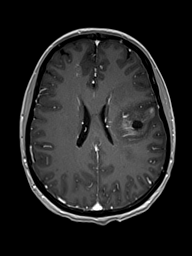
[im 120/160]
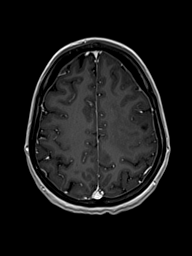
[im 140/160]
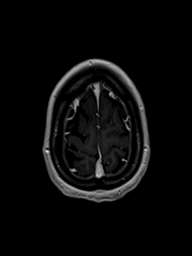
[im 160/160]
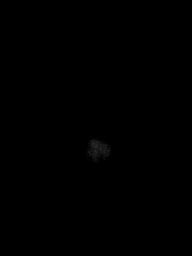

[Series 20: T1 post-contrast · coronal · 4.0mm · 0.72mm/px · 2 of 34 slices shown (1 of 2)]
[im 1/34]
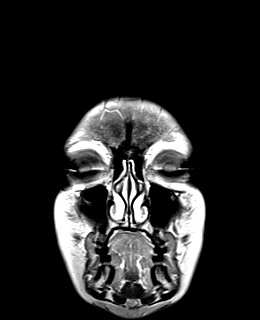
[im 34/34]
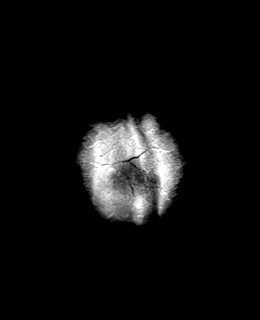

[Series 21: T1 post-contrast · sagittal · 4.0mm · 0.75mm/px · 2 of 29 slices shown (2 of 2)]
[im 1/29]
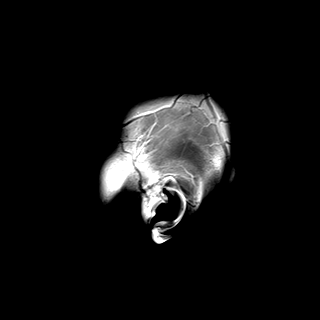
[im 29/29]
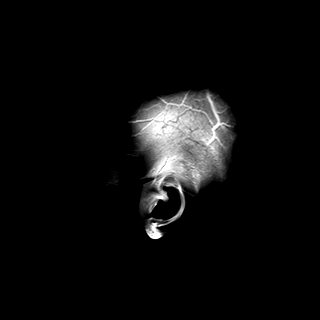

[48 of 48 positions shown; findings below may reference images not displayed]

FINDINGS: Brain: Biopsy cavity is present within the left posterior frontal
tumor with small amount of hemorrhage. T2 and FLAIR hyperintensity
is seen surrounding the surgical cavity extending to the insula and
into the left medial and anterior temporal lobe. T2 and FLAIR
hyperintensity shows interval improvement since [DATE].
Ill-defined enhancement is present surrounding the biopsy cavity
extending medially toward the left lateral ventricle similar to the
prior study. No new area of enhancement.

Ventricle size normal. 2 mm midline shift to the right has improved
due to decreased edema.

Developmental venous anomaly with mild chronic hemosiderin staining
in the right frontal lobe is unchanged.

Vascular: Normal arterial flow voids

Skull and upper cervical spine: Burr hole on the left. No acute
skeletal abnormality.

Sinuses/Orbits: Paranasal sinuses clear.  Negative orbit

Other: None
IMPRESSION: Left frontal lobe infiltrating tumor shows a positive response to
treatment. Decreased nonenhancing T2 and FLAIR hyperintensity in the
left frontal lobe and left temporal lobe. Enhancement in the
superior portion of the tumor surrounding the biopsy cavity is
similar in size to the pre treatment MRI. There is decreased edema
and mass-effect with less midline shift to the right.

## 2020-04-03 MED ORDER — GADOBENATE DIMEGLUMINE 529 MG/ML IV SOLN
12.0000 mL | Freq: Once | INTRAVENOUS | Status: AC | PRN
  Administered 2020-04-03: 12 mL via INTRAVENOUS

## 2020-04-09 ENCOUNTER — Encounter: Payer: Self-pay | Admitting: Internal Medicine

## 2020-04-09 ENCOUNTER — Inpatient Hospital Stay: Payer: BC Managed Care – PPO

## 2020-04-09 ENCOUNTER — Inpatient Hospital Stay: Payer: BC Managed Care – PPO | Attending: Internal Medicine | Admitting: Internal Medicine

## 2020-04-09 ENCOUNTER — Telehealth: Payer: Self-pay

## 2020-04-09 ENCOUNTER — Other Ambulatory Visit: Payer: Self-pay

## 2020-04-09 VITALS — BP 130/78 | HR 73 | Temp 98.9°F | Resp 18 | Ht 68.0 in | Wt 131.4 lb

## 2020-04-09 DIAGNOSIS — C711 Malignant neoplasm of frontal lobe: Secondary | ICD-10-CM | POA: Diagnosis present

## 2020-04-09 DIAGNOSIS — Z880 Allergy status to penicillin: Secondary | ICD-10-CM | POA: Diagnosis not present

## 2020-04-09 DIAGNOSIS — Z808 Family history of malignant neoplasm of other organs or systems: Secondary | ICD-10-CM | POA: Insufficient documentation

## 2020-04-09 DIAGNOSIS — Z8042 Family history of malignant neoplasm of prostate: Secondary | ICD-10-CM | POA: Diagnosis not present

## 2020-04-09 DIAGNOSIS — Z923 Personal history of irradiation: Secondary | ICD-10-CM | POA: Insufficient documentation

## 2020-04-09 DIAGNOSIS — Z8249 Family history of ischemic heart disease and other diseases of the circulatory system: Secondary | ICD-10-CM | POA: Diagnosis not present

## 2020-04-09 DIAGNOSIS — R55 Syncope and collapse: Secondary | ICD-10-CM | POA: Diagnosis not present

## 2020-04-09 DIAGNOSIS — R4781 Slurred speech: Secondary | ICD-10-CM | POA: Insufficient documentation

## 2020-04-09 DIAGNOSIS — Q283 Other malformations of cerebral vessels: Secondary | ICD-10-CM | POA: Diagnosis not present

## 2020-04-09 DIAGNOSIS — R569 Unspecified convulsions: Secondary | ICD-10-CM

## 2020-04-09 DIAGNOSIS — R200 Anesthesia of skin: Secondary | ICD-10-CM | POA: Insufficient documentation

## 2020-04-09 DIAGNOSIS — Z79899 Other long term (current) drug therapy: Secondary | ICD-10-CM | POA: Insufficient documentation

## 2020-04-09 LAB — CMP (CANCER CENTER ONLY)
ALT: 276 U/L (ref 0–44)
AST: 96 U/L — ABNORMAL HIGH (ref 15–41)
Albumin: 4.2 g/dL (ref 3.5–5.0)
Alkaline Phosphatase: 53 U/L (ref 38–126)
Anion gap: 8 (ref 5–15)
BUN: 12 mg/dL (ref 6–20)
CO2: 23 mmol/L (ref 22–32)
Calcium: 9.3 mg/dL (ref 8.9–10.3)
Chloride: 107 mmol/L (ref 98–111)
Creatinine: 1.07 mg/dL — ABNORMAL HIGH (ref 0.44–1.00)
GFR, Estimated: >60 mL/min (ref >60)
Glucose, Bld: 96 mg/dL (ref 70–99)
Potassium: 3.8 mmol/L (ref 3.5–5.1)
Sodium: 138 mmol/L (ref 135–145)
Total Bilirubin: 0.8 mg/dL (ref 0.3–1.2)
Total Protein: 7.4 g/dL (ref 6.5–8.1)

## 2020-04-09 LAB — CBC WITH DIFFERENTIAL (CANCER CENTER ONLY)
Abs Immature Granulocytes: 0.01 10*3/uL (ref 0.00–0.07)
Basophils Absolute: 0 10*3/uL (ref 0.0–0.1)
Basophils Relative: 1 %
Eosinophils Absolute: 0 10*3/uL (ref 0.0–0.5)
Eosinophils Relative: 2 %
HCT: 34 % — ABNORMAL LOW (ref 36.0–46.0)
Hemoglobin: 12.5 g/dL (ref 12.0–15.0)
Immature Granulocytes: 0 %
Lymphocytes Relative: 18 %
Lymphs Abs: 0.4 10*3/uL — ABNORMAL LOW (ref 0.7–4.0)
MCH: 33.8 pg (ref 26.0–34.0)
MCHC: 36.8 g/dL — ABNORMAL HIGH (ref 30.0–36.0)
MCV: 91.9 fL (ref 80.0–100.0)
Monocytes Absolute: 0.3 10*3/uL (ref 0.1–1.0)
Monocytes Relative: 12 %
Neutro Abs: 1.7 10*3/uL (ref 1.7–7.7)
Neutrophils Relative %: 67 %
Platelet Count: 238 10*3/uL (ref 150–400)
RBC: 3.7 MIL/uL — ABNORMAL LOW (ref 3.87–5.11)
RDW: 15.1 % (ref 11.5–15.5)
WBC Count: 2.5 10*3/uL — ABNORMAL LOW (ref 4.0–10.5)
nRBC: 0 % (ref 0.0–0.2)

## 2020-04-09 NOTE — Progress Notes (Signed)
[facility] at [address], [place] [address] [phone]   Interval Evaluation  Date of Service: 04/09/20 Patient Name: Candace [name] Patient MRN: [id number] Patient DOB: [birth year] Provider: Ventura Sellers, MD  Identifying Statement:  Candace [name] is a 40 y.o. female with left temporal grade 3 astrocytoma    Oncologic History: Oncology History  Astrocytoma of frontal lobe ([facility])  12/08/2019 Surgery   Stereotactic R temporal biopsy by Dr. Marcello Moores.  Path demonstrates Astrocytoma, IDH-mt, WHO grade 3   01/17/2020 - 03/02/2020 Radiation Therapy   IMRT and concurrent Temodar with Dr. Isidore Moos     Biomarkers:  MGMT Unknown.  IDH 1/2 Mutated.  EGFR Unknown  TERT Unknown   Interval History:  Candace [name] present to clinic today now having completed radiation and concurrent Temodar.  She describes no standing focal progressive neurologic deficits since radiation.  Language issues "come and go" and vary from day to day.  Past couple of days she has had "waves of dizzines or lightheadedness" that are otherwise difficult to characterize.  No seizures or headaches.   H+P (12/23/19) Patient presented to medical attention early in September with first ever seizure.  Event was described as "sudden onset left arm numbness" followed by "face twisting, and loss of consciousness for at least 30 minutes".  CNS imaging demonstrated a large left frontal non enhancing mass consistent with primary brain tumor.  She underwent biopsy on 12/08/19 with Dr. Marcello Moores.  Following surgery she has some slurring of speech but no other significant complaints, returned to prior baseline upon discharge.  Now home, she has had no breathrough events and no focal neurologic complaints.    Medications: Current Outpatient Medications on File Prior to Visit  Medication Sig Dispense Refill  . levETIRAcetam (KEPPRA) 750 MG tablet Take 1 tablet (750 mg total) by mouth 2  (two) times daily. 60 tablet 3  . MAGNESIUM PO Take 1 tablet by mouth daily. (Patient not taking: Reported on 02/28/2020)    . NON FORMULARY Take 1 Dose by mouth See admin instructions. [place] Mushroom    . NON FORMULARY Take 1 Dose by mouth See admin instructions. Frankincense (Patient not taking: Reported on 02/28/2020)    . NON FORMULARY Take 1 Dose by mouth See admin instructions. Berberine (Patient not taking: Reported on 02/28/2020)    . NON FORMULARY Take 1 Dose by mouth See admin instructions. Melantonin    . NON FORMULARY Take 1 Dose by mouth See admin instructions. Resveratrol (Patient not taking: Reported on 02/28/2020)    . NON FORMULARY Take 1 Dose by mouth See admin instructions. Green Tea Extract (Patient not taking: Reported on 02/28/2020)    . NON FORMULARY Take 1 Dose by mouth See admin instructions. Milk Thistle (Patient not taking: Reported on 02/28/2020)    . NON FORMULARY Take 1 Dose by mouth See admin instructions. Soy isoflavones (Patient not taking: Reported on 02/28/2020)    . NON FORMULARY Take 1 Dose by mouth See admin instructions. lycopene (Patient not taking: Reported on 02/28/2020)    . ondansetron (ZOFRAN) 8 MG tablet Take 1 tablet (8 mg total) by mouth every 8 (eight) hours as needed for nausea or vomiting. Take 30-39mn before temodar (Patient not taking: Reported on 03/16/2020) 60 tablet 1  . temozolomide (TEMODAR) 100 MG capsule Take 1 capsule (100 mg total) by mouth daily. May take on an empty stomach to decrease nausea & vomiting. (Patient not taking: Reported on 03/16/2020)  42 capsule 0  . temozolomide (TEMODAR) 20 MG capsule Take 1 capsule (20 mg total) by mouth daily. May take on an empty stomach to decrease nausea & vomiting. (Patient not taking: Reported on 03/16/2020) 42 capsule 0  . TURMERIC PO Take 1 tablet by mouth daily.    Marland Kitchen UNABLE TO FIND Take 1 Dose by mouth daily. Med Name: Mushroom Tincture; for immune support (Patient not taking: No sig reported)      No current facility-administered medications on file prior to visit.    Allergies:  Allergies  Allergen Reactions  . Penicillins Rash and Other (See Comments)    Pt does not recall so well   Past Medical History:  Past Medical History:  Diagnosis Date  . Headache    Past Surgical History:  Past Surgical History:  Procedure Laterality Date  . ABDOMINAL SURGERY    . APPLICATION OF CRANIAL NAVIGATION Left 12/08/2019   Procedure: APPLICATION OF CRANIAL NAVIGATION;  Surgeon: Vallarie Mare, MD;  Location: [facility];  Service: Neurosurgery;  Laterality: Left;  APPLICATION OF CRANIAL NAVIGATION  . FRAMELESS  BIOPSY WITH BRAINLAB Left 12/08/2019   Procedure: Left stereotactic brain biopsy with brainlab;  Surgeon: Vallarie Mare, MD;  Location: [facility];  Service: Neurosurgery;  Laterality: Left;  Left stereotactic brain biopsy with brainlab  . WISDOM TOOTH EXTRACTION     Social History:  Social History   Socioeconomic History  . Marital status: Married    Spouse name: Not on file  . Number of children: Not on file  . Years of education: Not on file  . Highest education level: Not on file  Occupational History  . Not on file  Tobacco Use  . Smoking status: Never Smoker  . Smokeless tobacco: Never Used  Substance and Sexual Activity  . Alcohol use: Not Currently  . Drug use: Never  . Sexual activity: Not on file  Other Topics Concern  . Not on file  Social History Narrative  . Not on file   Social Determinants of Health   Financial Resource Strain: Not on file  Food Insecurity: Not on file  Transportation Needs: Not on file  Physical Activity: Not on file  Stress: Not on file  Social Connections: Not on file  Intimate Partner Violence: Not on file   Family History:  Family History  Problem Relation Age of Onset  . Migraines Mother   . Thyroid cancer Father   . Testicular cancer Father     Review of Systems: Constitutional: Doesn't report fevers, chills or  abnormal weight loss Eyes: Doesn't report blurriness of vision Ears, nose, mouth, throat, and face: Doesn't report sore throat Respiratory: Doesn't report cough, dyspnea or wheezes Cardiovascular: Doesn't report palpitation, chest discomfort  Gastrointestinal:  Doesn't report nausea, constipation, diarrhea GU: Doesn't report incontinence Skin: Doesn't report skin rashes Neurological: Per HPI Musculoskeletal: Doesn't report joint pain Behavioral/Psych: Doesn't report anxiety  Physical Exam: Vitals:   04/09/20 1055  BP: 130/78  Pulse: 73  Resp: 18  Temp: 98.9 F (37.2 C)  SpO2: 100%   KPS: 90. General: Alert, cooperative, pleasant, in no acute distress Head: Normal EENT: No conjunctival injection or scleral icterus.  Lungs: Resp effort normal Cardiac: Regular rate Abdomen: Non-distended abdomen Skin: No rashes cyanosis or petechiae. Extremities: No clubbing or edema  Neurologic Exam: Mental Status: Awake, alert, attentive to examiner. Oriented to self and environment. Language is fluent with intact comprehension.  Cranial Nerves: Visual acuity is grossly normal. Visual fields  are full. Extra-ocular movements intact. No ptosis. Face is symmetric Motor: Tone and bulk are normal. Power is full in both arms and legs. Reflexes are symmetric, no pathologic reflexes present.  Sensory: Intact to light touch Gait: Normal.   Labs: I have reviewed the data as listed    Component Value Date/Time   NA 140 03/16/2020 1114   K 3.9 03/16/2020 1114   CL 109 03/16/2020 1114   CO2 24 03/16/2020 1114   GLUCOSE 95 03/16/2020 1114   BUN 16 03/16/2020 1114   CREATININE 1.08 (H) 03/16/2020 1114   CALCIUM 9.1 03/16/2020 1114   PROT 7.1 03/16/2020 1114   ALBUMIN 4.1 03/16/2020 1114   AST 26 03/16/2020 1114   ALT 40 03/16/2020 1114   ALKPHOS 46 03/16/2020 1114   BILITOT 0.7 03/16/2020 1114   GFRNONAA >60 03/16/2020 1114   GFRAA >60 12/09/2019 0623   Lab Results  Component Value Date    WBC 1.9 (L) 03/16/2020   NEUTROABS 1.2 (L) 03/16/2020   HGB 11.1 (L) 03/16/2020   HCT 31.1 (L) 03/16/2020   MCV 90.7 03/16/2020   PLT 155 03/16/2020   Imaging:  [facility] Clinician Interpretation: I have personally reviewed the CNS images as listed.  My interpretation, in the context of the patient's clinical presentation, is stable disease  MR BRAIN W WO CONTRAST  Result Date: 04/03/2020 CLINICAL DATA:  Grade 3 astrocytoma left frontal lobe. Post biopsy and radiation. EXAM: MRI HEAD WITHOUT AND WITH CONTRAST TECHNIQUE: Multiplanar, multiecho pulse sequences of the brain and surrounding structures were obtained without and with intravenous contrast. CONTRAST:  46m MULTIHANCE GADOBENATE DIMEGLUMINE 529 MG/ML IV SOLN COMPARISON:  MRI head with contrast 12/07/2019 FINDINGS: Brain: Biopsy cavity is present within the left posterior frontal tumor with small amount of hemorrhage. T2 and FLAIR hyperintensity is seen surrounding the surgical cavity extending to the insula and into the left medial and anterior temporal lobe. T2 and FLAIR hyperintensity shows interval improvement since 12/07/2019. Ill-defined enhancement is present surrounding the biopsy cavity extending medially toward the left lateral ventricle similar to the prior study. No new area of enhancement. Ventricle size normal. 2 mm midline shift to the right has improved due to decreased edema. Developmental venous anomaly with mild chronic hemosiderin staining in the right frontal lobe is unchanged. Vascular: Normal arterial flow voids Skull and upper cervical spine: Burr hole on the left. No acute skeletal abnormality. Sinuses/Orbits: Paranasal sinuses clear.  Negative orbit Other: None IMPRESSION: Left frontal lobe infiltrating tumor shows a positive response to treatment. Decreased nonenhancing T2 and FLAIR hyperintensity in the left frontal lobe and left temporal lobe. Enhancement in the superior portion of the tumor surrounding the biopsy cavity  is similar in size to the pre treatment MRI. There is decreased edema and mass-effect with less midline shift to the right. Electronically Signed   By: CFranchot GalloM.D.   On: 04/03/2020 14:36   Assessment/Plan Astrocytoma of frontal lobe ([place] [C71.1]  We appreciate the opportunity to participate in the care of JDinah Beers  She is clinically and radiographically stable today, having completed 6 weeks of IMRT+TMZ.  No new or progressive deficits.  Labs demonstrate some trans-aminitis, like as an effect of Temozolomide.  We provided counseling today regarding treatment options moving forward.  She is not interested in oral chemotherapy at this time because of poor tolerance during radiation.  We are ok with transition to close MRI monitoring in absence of adjuvant therapy.  Will continue Keppra [id number].  Would like to recheck basic labs in 2 weeks given trans-aminitis today.    We ask that Justiss Gerbino return to clinic in 2 months following next brain MRI, or sooner as needed.  All questions were answered. The patient knows to call the clinic with any problems, questions or concerns. No barriers to learning were detected.  The total time spent in the encounter was 40 minutes and more than 50% was on counseling and review of test results   Ventura Sellers, MD Medical Director of Neuro-Oncology [facility] at [place] 04/09/20 10:52 AM

## 2020-04-09 NOTE — Telephone Encounter (Signed)
Lab called to report critical value. Alt:[id number] Dr Mickeal Skinner notified.

## 2020-04-12 ENCOUNTER — Encounter: Payer: Self-pay | Admitting: Internal Medicine

## 2020-04-12 MED ORDER — DEXAMETHASONE 4 MG PO TABS: 4.0000 mg | ORAL_TABLET | Freq: Every day | ORAL | 0 refills | Status: DC

## 2020-04-23 ENCOUNTER — Other Ambulatory Visit: Payer: Self-pay

## 2020-04-23 ENCOUNTER — Inpatient Hospital Stay: Payer: BC Managed Care – PPO

## 2020-04-23 DIAGNOSIS — C711 Malignant neoplasm of frontal lobe: Secondary | ICD-10-CM | POA: Diagnosis not present

## 2020-04-23 LAB — CMP (CANCER CENTER ONLY)
ALT: 25 U/L (ref 0–44)
AST: 17 U/L (ref 15–41)
Albumin: 4.1 g/dL (ref 3.5–5.0)
Alkaline Phosphatase: 44 U/L (ref 38–126)
Anion gap: 8 (ref 5–15)
BUN: 12 mg/dL (ref 6–20)
CO2: 22 mmol/L (ref 22–32)
Calcium: 8.7 mg/dL — ABNORMAL LOW (ref 8.9–10.3)
Chloride: 106 mmol/L (ref 98–111)
Creatinine: 1.15 mg/dL — ABNORMAL HIGH (ref 0.44–1.00)
GFR, Estimated: >60 mL/min (ref >60)
Glucose, Bld: 90 mg/dL (ref 70–99)
Potassium: 3.7 mmol/L (ref 3.5–5.1)
Sodium: 136 mmol/L (ref 135–145)
Total Bilirubin: 0.6 mg/dL (ref 0.3–1.2)
Total Protein: 7 g/dL (ref 6.5–8.1)

## 2020-04-23 LAB — CBC WITH DIFFERENTIAL (CANCER CENTER ONLY)
Abs Immature Granulocytes: 0 10*3/uL (ref 0.00–0.07)
Basophils Absolute: 0 10*3/uL (ref 0.0–0.1)
Basophils Relative: 1 %
Eosinophils Absolute: 0 10*3/uL (ref 0.0–0.5)
Eosinophils Relative: 1 %
HCT: 34.8 % — ABNORMAL LOW (ref 36.0–46.0)
Hemoglobin: 12.4 g/dL (ref 12.0–15.0)
Immature Granulocytes: 0 %
Lymphocytes Relative: 19 %
Lymphs Abs: 0.6 10*3/uL — ABNORMAL LOW (ref 0.7–4.0)
MCH: 33.8 pg (ref 26.0–34.0)
MCHC: 35.6 g/dL (ref 30.0–36.0)
MCV: 94.8 fL (ref 80.0–100.0)
Monocytes Absolute: 0.3 10*3/uL (ref 0.1–1.0)
Monocytes Relative: 11 %
Neutro Abs: 2.1 10*3/uL (ref 1.7–7.7)
Neutrophils Relative %: 68 %
Platelet Count: 181 10*3/uL (ref 150–400)
RBC: 3.67 MIL/uL — ABNORMAL LOW (ref 3.87–5.11)
RDW: 12 % (ref 11.5–15.5)
WBC Count: 3.1 10*3/uL — ABNORMAL LOW (ref 4.0–10.5)
nRBC: 0 % (ref 0.0–0.2)

## 2020-05-04 ENCOUNTER — Other Ambulatory Visit: Payer: Self-pay | Admitting: Radiation Therapy

## 2020-05-07 ENCOUNTER — Other Ambulatory Visit: Payer: Self-pay | Admitting: Internal Medicine

## 2020-05-08 ENCOUNTER — Encounter: Payer: Self-pay | Admitting: Internal Medicine

## 2020-05-08 MED ORDER — LEVETIRACETAM 500 MG PO TABS: 500.0000 mg | ORAL_TABLET | Freq: Two times a day (BID) | ORAL | 3 refills | Status: DC

## 2020-05-08 NOTE — Telephone Encounter (Signed)
Please see refill information

## 2020-05-31 ENCOUNTER — Encounter: Payer: Self-pay | Admitting: Internal Medicine

## 2020-06-08 ENCOUNTER — Ambulatory Visit (HOSPITAL_COMMUNITY)
Admission: RE | Admit: 2020-06-08 | Discharge: 2020-06-08 | Disposition: A | Discharge status: Home / Self Care | Payer: BC Managed Care – PPO | Source: Ambulatory Visit | Attending: Internal Medicine | Admitting: Internal Medicine

## 2020-06-08 ENCOUNTER — Other Ambulatory Visit: Payer: Self-pay

## 2020-06-08 ENCOUNTER — Other Ambulatory Visit: Payer: Self-pay | Admitting: Internal Medicine

## 2020-06-08 DIAGNOSIS — C711 Malignant neoplasm of frontal lobe: Secondary | ICD-10-CM

## 2020-06-08 IMAGING — MR MR HEAD W/O CM
12 series · 48 of 48 positions shown · non-contrast
Comparison: [DATE]

CLINICAL DATA: Assess treatment response

EXAM:
MRI HEAD WITHOUT CONTRAST
TECHNIQUE: Multiplanar, multiecho pulse sequences of the brain and surrounding
structures were obtained without intravenous contrast.

[Series 5: DWI · axial · 3.0mm · 1.36mm/px · z∈[-63,+90]mm · 7 of 104 slices shown (1 of 2)]
[im 1/104]
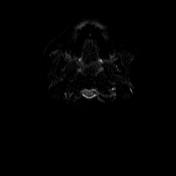
[im 18/104]
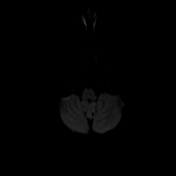
[im 35/104]
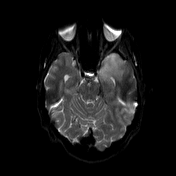
[im 52/104]
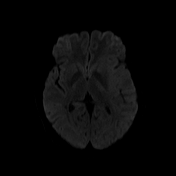
[im 69/104]
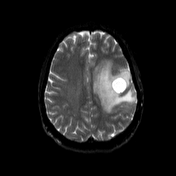
[im 86/104]
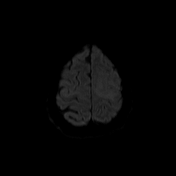
[im 104/104]
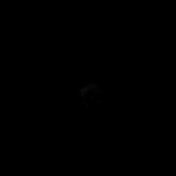

[Series 6: DWI · axial · 3.0mm · 1.36mm/px · z∈[-63,+90]mm · 4 of 52 slices shown (2 of 2)]
[im 1/52]
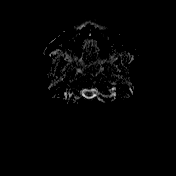
[im 18/52]
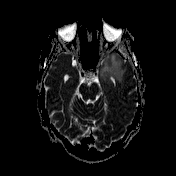
[im 35/52]
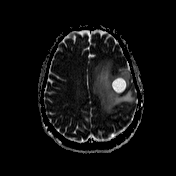
[im 52/52]
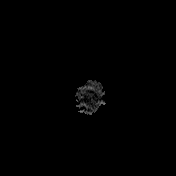

[Series 7: T1 · sagittal · 5.0mm · 0.75mm/px · 2 of 24 slices shown (1 of 3)]
[im 1/24]
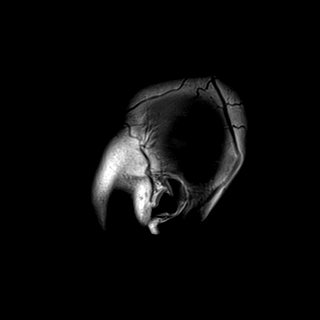
[im 24/24]
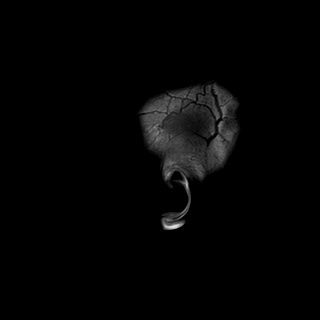

[Series 8: T2 · axial · 5.0mm · 0.62mm/px · z∈[-60,+90]mm · 2 of 24 slices shown (1 of 2)]
[im 1/24]
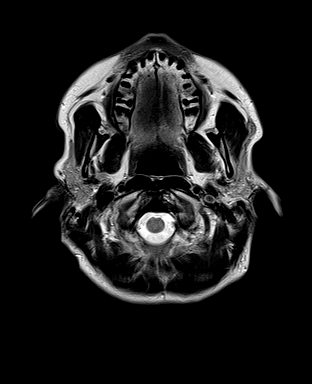
[im 24/24]
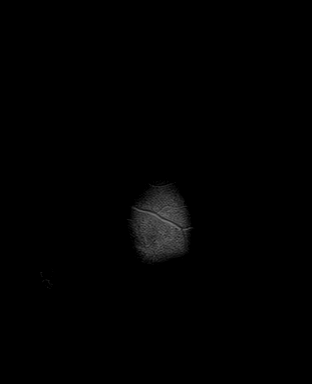

[Series 9: T1 · sagittal · 5.0mm · 0.75mm/px · 2 of 26 slices shown (2 of 3)]
[im 1/26]
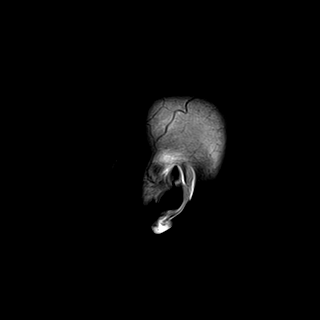
[im 26/26]
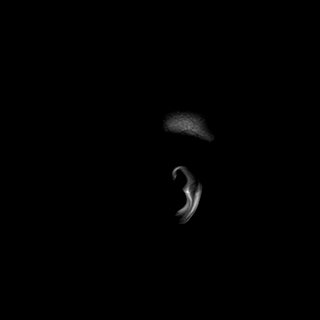

[Series 10: mip_images(sw) · axial · 24.0mm · 0.75mm/px · z∈[-51,+81]mm · 3 of 45 slices shown]
[im 1/45]
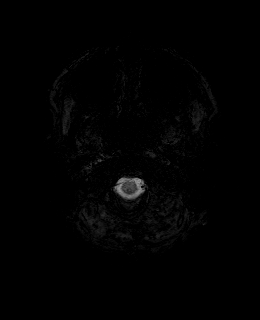
[im 23/45]
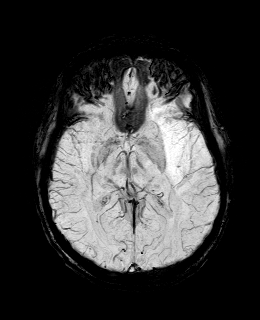
[im 45/45]
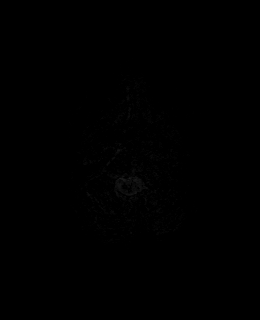

[Series 11: swi_images · axial · 3.0mm · 0.75mm/px · z∈[-62,+91]mm · 4 of 52 slices shown]
[im 1/52]
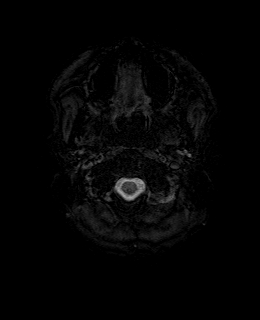
[im 18/52]
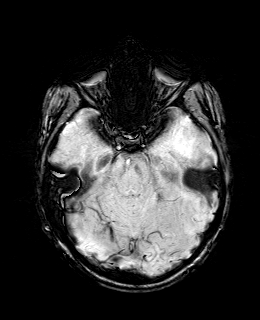
[im 35/52]
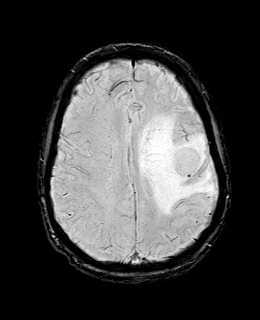
[im 52/52]
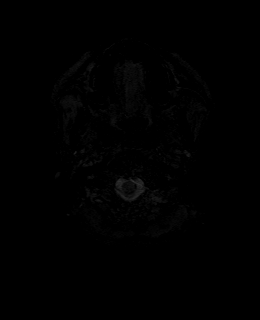

[Series 12: FLAIR · axial · 3.0mm · 0.75mm/px · z∈[-62,+91]mm · 4 of 52 slices shown]
[im 1/52]
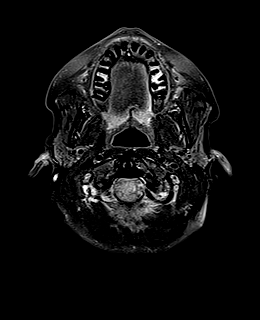
[im 18/52]
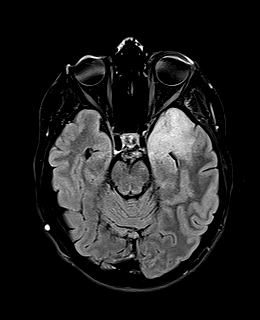
[im 35/52]
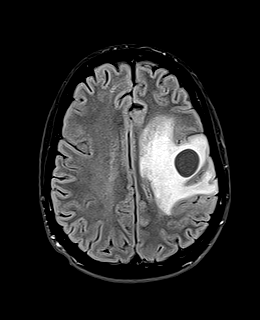
[im 52/52]
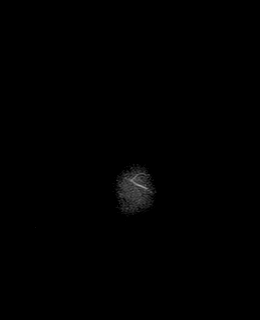

[Series 13: T1 · axial · 1.0mm · 0.94mm/px · z∈[-59,+100]mm · 11 of 160 slices shown (3 of 3)]
[im 1/160]
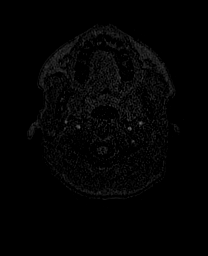
[im 16/160]
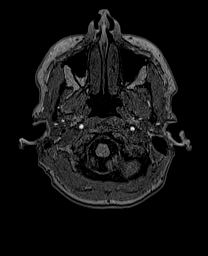
[im 32/160]
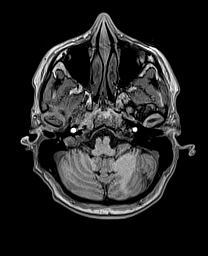
[im 48/160]
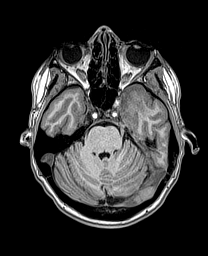
[im 64/160]
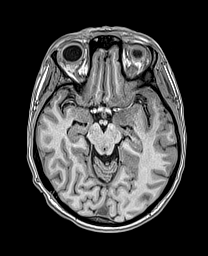
[im 80/160]
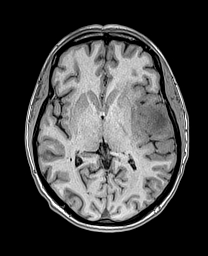
[im 96/160]
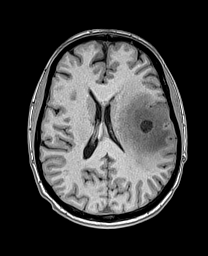
[im 112/160]
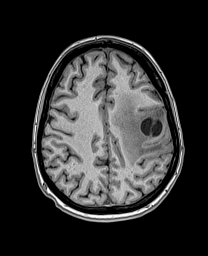
[im 128/160]
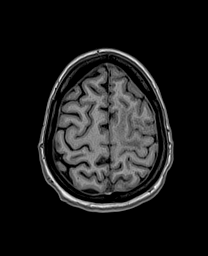
[im 144/160]
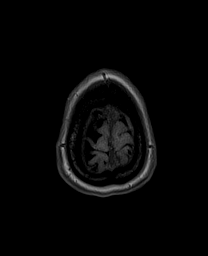
[im 160/160]
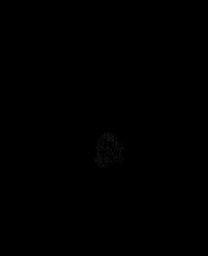

[Series 14: cor dwi_tracew · coronal · 5.0mm · 1.53mm/px · 4 of 60 slices shown]
[im 1/60]
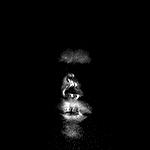
[im 20/60]
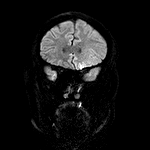
[im 40/60]
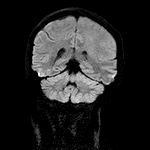
[im 60/60]
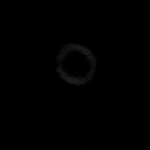

[Series 15: cor dwi_adc · coronal · 5.0mm · 1.53mm/px · 2 of 30 slices shown]
[im 1/30]
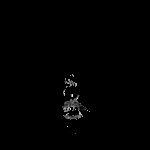
[im 30/30]
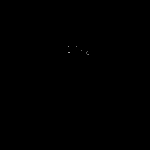

[Series 16: T2 · coronal · 5.0mm · 0.57mm/px · 3 of 39 slices shown (2 of 2)]
[im 1/39]
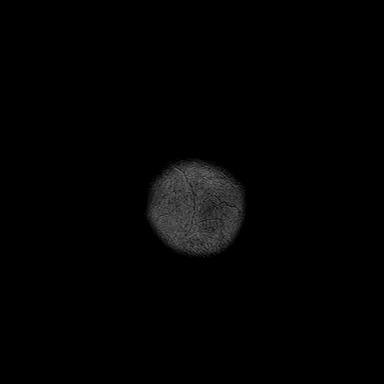
[im 20/39]
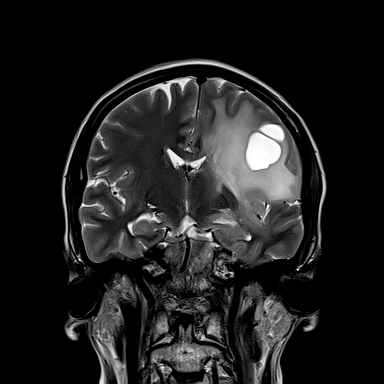
[im 39/39]
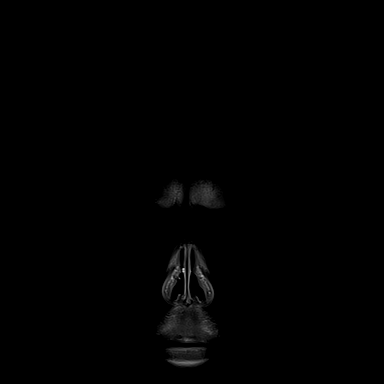

[48 of 48 positions shown; findings below may reference images not displayed]

FINDINGS: Brain: Infiltrative T2 hyperintense mass in the left frontal to
temporal lobes shows progressive T2 signal in all directions with
increased confluence and greater swelling. The left uncus bulges
more into the cisterns and midline shift has increased to 5 mm.
Cysts at the level of biopsy show mild size increase but still
simple internal characteristics. Contrast was not administered. No
infarct, entrapment, or extra-axial collection.

Vascular: Normal flow voids

Skull and upper cervical spine: Normal marrow signal

Sinuses/Orbits: Negative
IMPRESSION: Interval progression of left cerebral glioma.

## 2020-06-11 ENCOUNTER — Other Ambulatory Visit: Payer: Self-pay

## 2020-06-11 ENCOUNTER — Inpatient Hospital Stay: Payer: BC Managed Care – PPO | Attending: Internal Medicine | Admitting: Internal Medicine

## 2020-06-11 ENCOUNTER — Inpatient Hospital Stay: Payer: BC Managed Care – PPO

## 2020-06-11 VITALS — BP 127/94 | HR 82 | Temp 98.2°F | Resp 17 | Ht 68.0 in | Wt 127.3 lb

## 2020-06-11 DIAGNOSIS — C711 Malignant neoplasm of frontal lobe: Secondary | ICD-10-CM | POA: Insufficient documentation

## 2020-06-11 DIAGNOSIS — Z808 Family history of malignant neoplasm of other organs or systems: Secondary | ICD-10-CM | POA: Insufficient documentation

## 2020-06-11 DIAGNOSIS — Z880 Allergy status to penicillin: Secondary | ICD-10-CM | POA: Diagnosis not present

## 2020-06-11 DIAGNOSIS — Z8249 Family history of ischemic heart disease and other diseases of the circulatory system: Secondary | ICD-10-CM | POA: Diagnosis not present

## 2020-06-11 DIAGNOSIS — Z8042 Family history of malignant neoplasm of prostate: Secondary | ICD-10-CM | POA: Diagnosis not present

## 2020-06-11 DIAGNOSIS — Z79899 Other long term (current) drug therapy: Secondary | ICD-10-CM | POA: Insufficient documentation

## 2020-06-11 DIAGNOSIS — R4781 Slurred speech: Secondary | ICD-10-CM | POA: Insufficient documentation

## 2020-06-11 DIAGNOSIS — F419 Anxiety disorder, unspecified: Secondary | ICD-10-CM | POA: Diagnosis not present

## 2020-06-11 DIAGNOSIS — R4702 Dysphasia: Secondary | ICD-10-CM | POA: Insufficient documentation

## 2020-06-11 DIAGNOSIS — R55 Syncope and collapse: Secondary | ICD-10-CM | POA: Insufficient documentation

## 2020-06-11 DIAGNOSIS — R200 Anesthesia of skin: Secondary | ICD-10-CM | POA: Diagnosis not present

## 2020-06-11 DIAGNOSIS — Z7952 Long term (current) use of systemic steroids: Secondary | ICD-10-CM | POA: Insufficient documentation

## 2020-06-11 MED ORDER — DEXAMETHASONE 4 MG PO TABS: 4.0000 mg | ORAL_TABLET | Freq: Every day | ORAL | 1 refills | Status: DC

## 2020-06-11 NOTE — Progress Notes (Signed)
[facility] at [address], [place] [address] [phone]   Interval Evaluation  Date of Service: 06/11/20 Patient Name: Candace [name] Patient MRN: [id number] Patient DOB: 06/23/1980 Provider: Ventura Sellers, MD  Identifying Statement:  Candace [name] is a 40 y.o. female with left temporal grade 3 astrocytoma    Oncologic History: Oncology History  Astrocytoma of frontal lobe ([facility])  12/08/2019 Surgery   Stereotactic R temporal biopsy by Dr. Marcello Moores.  Path demonstrates Astrocytoma, IDH-mt, WHO grade 3   01/17/2020 - 03/02/2020 Radiation Therapy   IMRT and concurrent Temodar with Dr. Isidore Moos     Biomarkers:  MGMT Unknown.  IDH 1/2 Mutated.  EGFR Unknown  TERT Unknown   Interval History:  Candace [name] present to clinic today after recent MRI brain.  She describes clear worsening of anguage issues, particularly with her ability to speak fluently.  She is still able to communicate, just with greater difficulty.  Also describes some "heaviness" in her right hand, maybe some weakness.  She dropped something out of the hand this weekend.  Otherwise energy level is fine, denies seizures, headaches, dizzy episodes.  H+P (12/23/19) Patient presented to medical attention early in September with first ever seizure.  Event was described as "sudden onset left arm numbness" followed by "face twisting, and loss of consciousness for at least 30 minutes".  CNS imaging demonstrated a large left frontal non enhancing mass consistent with primary brain tumor.  She underwent biopsy on 12/08/19 with Dr. Marcello Moores.  Following surgery she has some slurring of speech but no other significant complaints, returned to prior baseline upon discharge.  Now home, she has had no breathrough events and no focal neurologic complaints.    Medications: Current Outpatient Medications on File Prior to Visit  Medication Sig Dispense Refill  . dexamethasone (DECADRON)  4 MG tablet Take 1 tablet (4 mg total) by mouth daily. 5 tablet 0  . levETIRAcetam (KEPPRA) 500 MG tablet Take 1 tablet (500 mg total) by mouth 2 (two) times daily. 60 tablet 3  . MAGNESIUM PO Take 1 tablet by mouth daily.    . NON FORMULARY Take 1 Dose by mouth See admin instructions. Maitake Mushroom (Patient not taking: Reported on 04/09/2020)    . NON FORMULARY Take 1 Dose by mouth See admin instructions. Frankincense    . NON FORMULARY Take 1 Dose by mouth See admin instructions. Berberine (Patient not taking: No sig reported)    . NON FORMULARY Take 1 Dose by mouth See admin instructions. Melantonin (Patient not taking: Reported on 04/09/2020)    . NON FORMULARY Take 1 Dose by mouth See admin instructions. Resveratrol (Patient not taking: No sig reported)    . NON FORMULARY Take 1 Dose by mouth See admin instructions. Green Tea Extract (Patient not taking: No sig reported)    . NON FORMULARY Take 1 Dose by mouth See admin instructions. Milk Thistle (Patient not taking: No sig reported)    . NON FORMULARY Take 1 Dose by mouth See admin instructions. Soy isoflavones (Patient not taking: No sig reported)    . NON FORMULARY Take 1 Dose by mouth See admin instructions. lycopene (Patient not taking: No sig reported)    . ondansetron (ZOFRAN) 8 MG tablet Take 1 tablet (8 mg total) by mouth every 8 (eight) hours as needed for nausea or vomiting. Take 30-32mn before temodar (Patient not taking: No sig reported) 60 tablet 1  . TURMERIC PO Take 1  tablet by mouth daily.    Marland Kitchen UNABLE TO FIND Take 1 Dose by mouth daily. Med Name: Mushroom Tincture; for immune support     No current facility-administered medications on file prior to visit.    Allergies:  Allergies  Allergen Reactions  . Penicillins Rash and Other (See Comments)    Pt does not recall so well   Past Medical History:  Past Medical History:  Diagnosis Date  . Headache    Past Surgical History:  Past Surgical History:  Procedure  Laterality Date  . ABDOMINAL SURGERY    . APPLICATION OF CRANIAL NAVIGATION Left 12/08/2019   Procedure: APPLICATION OF CRANIAL NAVIGATION;  Surgeon: Vallarie Mare, MD;  Location: [facility];  Service: Neurosurgery;  Laterality: Left;  APPLICATION OF CRANIAL NAVIGATION  . FRAMELESS  BIOPSY WITH BRAINLAB Left 12/08/2019   Procedure: Left stereotactic brain biopsy with brainlab;  Surgeon: Vallarie Mare, MD;  Location: [facility];  Service: Neurosurgery;  Laterality: Left;  Left stereotactic brain biopsy with brainlab  . WISDOM TOOTH EXTRACTION     Social History:  Social History   Socioeconomic History  . Marital status: Married    Spouse name: Not on file  . Number of children: Not on file  . Years of education: Not on file  . Highest education level: Not on file  Occupational History  . Not on file  Tobacco Use  . Smoking status: Never Smoker  . Smokeless tobacco: Never Used  Substance and Sexual Activity  . Alcohol use: Not Currently  . Drug use: Never  . Sexual activity: Not on file  Other Topics Concern  . Not on file  Social History Narrative  . Not on file   Social Determinants of Health   Financial Resource Strain: Not on file  Food Insecurity: Not on file  Transportation Needs: Not on file  Physical Activity: Not on file  Stress: Not on file  Social Connections: Not on file  Intimate Partner Violence: Not on file   Family History:  Family History  Problem Relation Age of Onset  . Migraines Mother   . Thyroid cancer Father   . Testicular cancer Father     Review of Systems: Constitutional: Doesn't report fevers, chills or abnormal weight loss Eyes: Doesn't report blurriness of vision Ears, nose, mouth, throat, and face: Doesn't report sore throat Respiratory: Doesn't report cough, dyspnea or wheezes Cardiovascular: Doesn't report palpitation, chest discomfort  Gastrointestinal:  Doesn't report nausea, constipation, diarrhea GU: Doesn't report  incontinence Skin: Doesn't report skin rashes Neurological: Per HPI Musculoskeletal: Doesn't report joint pain Behavioral/Psych: +anxiety  Physical Exam: Vitals:   06/11/20 1112  BP: (!) 127/94  Pulse: 82  Resp: 17  Temp: 98.2 F (36.8 C)  SpO2: 100%   KPS: 80. General: Alert, cooperative, pleasant, in no acute distress Head: Normal EENT: No conjunctival injection or scleral icterus.  Lungs: Resp effort normal Cardiac: Regular rate Abdomen: Non-distended abdomen Skin: No rashes cyanosis or petechiae. Extremities: No clubbing or edema  Neurologic Exam: Mental Status: Awake, alert, attentive to examiner. Oriented to self and environment. Expressive dysphasia Cranial Nerves: Visual acuity is grossly normal. Visual fields are full. Extra-ocular movements intact. No ptosis. Face is symmetric Motor: Tone and bulk are normal. Power is full in both arms and legs. Reflexes are symmetric, no pathologic reflexes present.  Sensory: Intact to light touch Gait: Normal.   Labs: I have reviewed the data as listed    Component Value Date/Time  NA 136 04/23/2020 1103   K 3.7 04/23/2020 1103   CL 106 04/23/2020 1103   CO2 22 04/23/2020 1103   GLUCOSE 90 04/23/2020 1103   BUN 12 04/23/2020 1103   CREATININE 1.15 (H) 04/23/2020 1103   CALCIUM 8.7 (L) 04/23/2020 1103   PROT 7.0 04/23/2020 1103   ALBUMIN 4.1 04/23/2020 1103   AST 17 04/23/2020 1103   ALT 25 04/23/2020 1103   ALKPHOS 44 04/23/2020 1103   BILITOT 0.6 04/23/2020 1103   GFRNONAA >60 04/23/2020 1103   GFRAA >60 12/09/2019 0623   Lab Results  Component Value Date   WBC 3.1 (L) 04/23/2020   NEUTROABS 2.1 04/23/2020   HGB 12.4 04/23/2020   HCT 34.8 (L) 04/23/2020   MCV 94.8 04/23/2020   PLT 181 04/23/2020   Imaging:  [facility] Clinician Interpretation: I have personally reviewed the CNS images as listed.  My interpretation, in the context of the patient's clinical presentation, is treatment effect vs true  progression  MR BRAIN WO CONTRAST  Result Date: 06/08/2020 CLINICAL DATA:  Assess treatment response EXAM: MRI HEAD WITHOUT CONTRAST TECHNIQUE: Multiplanar, multiecho pulse sequences of the brain and surrounding structures were obtained without intravenous contrast. COMPARISON:  04/03/2020 FINDINGS: Brain: Infiltrative T2 hyperintense mass in the left frontal to temporal lobes shows progressive T2 signal in all directions with increased confluence and greater swelling. The left uncus bulges more into the cisterns and midline shift has increased to 5 mm. Cysts at the level of biopsy show mild size increase but still simple internal characteristics. Contrast was not administered. No infarct, entrapment, or extra-axial collection. Vascular: Normal flow voids Skull and upper cervical spine: Normal marrow signal Sinuses/Orbits: Negative IMPRESSION: Interval progression of left cerebral glioma. Electronically Signed   By: Monte Fantasia M.D.   On: 06/08/2020 13:08   Assessment/Plan Astrocytoma of frontal lobe ([place]) [C71.1]   Dinah Beers presents today with clinical and radiographic progression.  MRI demonstrates increased burden of T2/FLAIR signal surrounding biopsy site, with new transfalcine herniation.  Unfortunately no contrast sequences were obtained for review and comparison.  Etiology of clinical and radiographic change is either pseudoprogression or organic progression of tumor.   Given rapid rate of change and timing (3 months s/p IMRT) we feel treatment effect is likely.  We recommended beginning therapy with dexamethasone, 55m daily x4 days, followed by 466mdaily thereafter.   We will give her a call next week to assess response to corticosteroids, and to continue titration.  We ask that JeFelecia Stanfilleturn to clinic in 1 months following next brain MRI, or sooner as needed.  If progression persists will plan to re-engage with some form of systemic therapy.  Will continue Keppra  [id number].    All questions were answered. The patient knows to call the clinic with any problems, questions or concerns. No barriers to learning were detected.  The total time spent in the encounter was 40 minutes and more than 50% was on counseling and review of test results   ZacVentura SellersD Medical Director of Neuro-Oncology [facility] [place]/14/22 11:18 AM

## 2020-06-14 ENCOUNTER — Telehealth: Payer: Self-pay | Admitting: Internal Medicine

## 2020-06-14 ENCOUNTER — Encounter: Payer: Self-pay | Admitting: Internal Medicine

## 2020-06-14 NOTE — Telephone Encounter (Signed)
Scheduled per los. Called and spoke with patient. Confirmed appt 

## 2020-06-15 ENCOUNTER — Other Ambulatory Visit: Payer: Self-pay | Admitting: Radiation Therapy

## 2020-06-25 ENCOUNTER — Encounter: Payer: Self-pay | Admitting: Internal Medicine

## 2020-06-28 ENCOUNTER — Encounter: Payer: Self-pay | Admitting: Internal Medicine

## 2020-06-29 ENCOUNTER — Other Ambulatory Visit: Payer: Self-pay | Admitting: Internal Medicine

## 2020-06-29 DIAGNOSIS — R911 Solitary pulmonary nodule: Secondary | ICD-10-CM

## 2020-07-06 ENCOUNTER — Other Ambulatory Visit: Payer: Self-pay

## 2020-07-06 ENCOUNTER — Ambulatory Visit (HOSPITAL_COMMUNITY)
Admission: RE | Admit: 2020-07-06 | Discharge: 2020-07-06 | Disposition: A | Discharge status: Home / Self Care | Payer: BC Managed Care – PPO | Source: Ambulatory Visit | Attending: Internal Medicine | Admitting: Internal Medicine

## 2020-07-06 DIAGNOSIS — C711 Malignant neoplasm of frontal lobe: Secondary | ICD-10-CM | POA: Diagnosis present

## 2020-07-06 IMAGING — MR MR HEAD WO/W CM
16 series · 48 of 48 positions shown · IV contrast (gadavist)
Comparison: MRI of the brain [DATE] and [DATE].

CLINICAL DATA: Astrocytoma of frontal lobe. Assess treatment
response.

EXAM:
MRI HEAD WITHOUT AND WITH CONTRAST
TECHNIQUE: Multiplanar, multiecho pulse sequences of the brain and surrounding
structures were obtained without and with intravenous contrast.
CONTRAST:  6mL GADAVIST GADOBUTROL 1 MMOL/ML IV SOLN

[Series 5: DWI · axial · 3.0mm · 1.36mm/px · z∈[-64,+82]mm · 7 of 99 slices shown (1 of 2)]
[im 1/99]
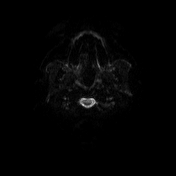
[im 17/99]
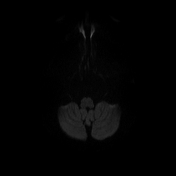
[im 33/99]
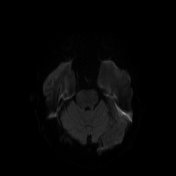
[im 50/99]
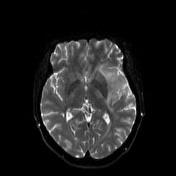
[im 66/99]
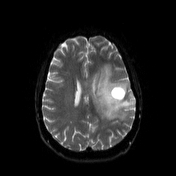
[im 82/99]
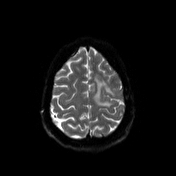
[im 99/99]
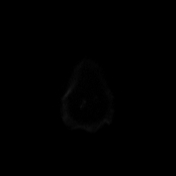

[Series 6: DWI · axial · 3.0mm · 1.36mm/px · z∈[-64,+82]mm · 3 of 50 slices shown (2 of 2)]
[im 1/50]
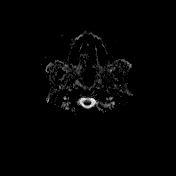
[im 25/50]
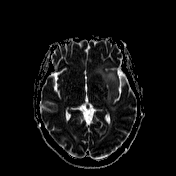
[im 50/50]
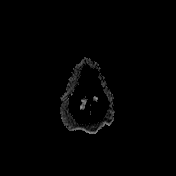

[Series 7: T1 · sagittal · 5.0mm · 0.75mm/px · 1 of 24 slices shown (1 of 4)]
[im 1/24]
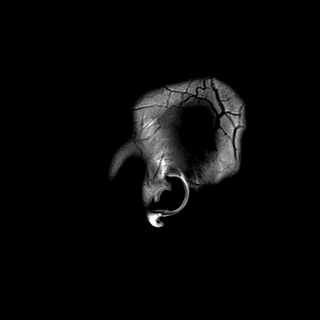

[Series 8: T2 · axial · 5.0mm · 0.62mm/px · 1 of 24 slices shown]
[im 1/24]
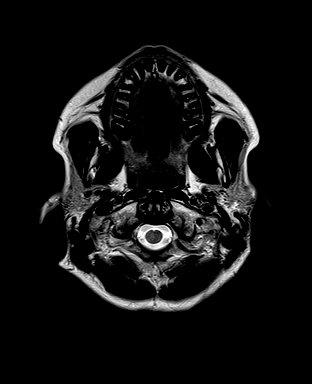

[Series 9: mip_images(sw) · axial · 24.0mm · 0.75mm/px · z∈[-57,+75]mm · 3 of 45 slices shown]
[im 1/45]
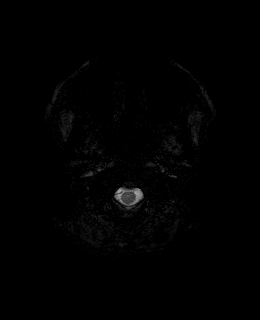
[im 23/45]
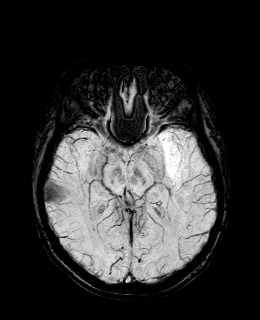
[im 45/45]
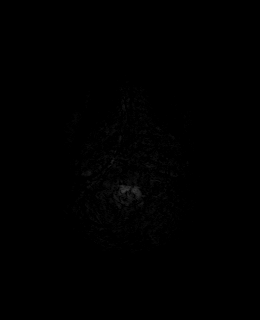

[Series 10: swi_images · axial · 3.0mm · 0.75mm/px · z∈[-67,+85]mm · 3 of 52 slices shown]
[im 1/52]
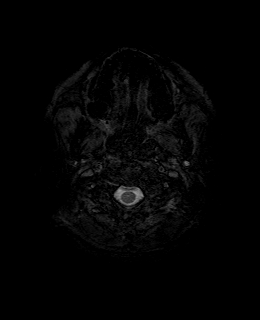
[im 26/52]
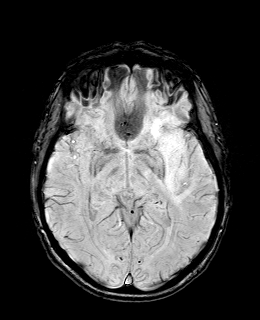
[im 52/52]
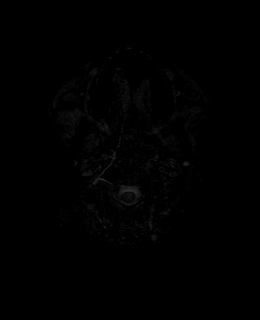

[Series 11: FLAIR · axial · 3.0mm · 0.75mm/px · z∈[-67,+85]mm · 3 of 52 slices shown]
[im 1/52]
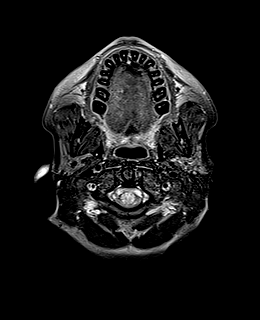
[im 26/52]
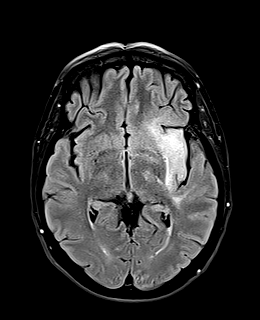
[im 52/52]
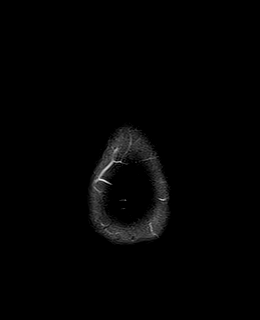

[Series 12: T1 · axial · 1.0mm · 0.94mm/px · z∈[-60,+82]mm · 8 of 144 slices shown (2 of 4)]
[im 1/144]
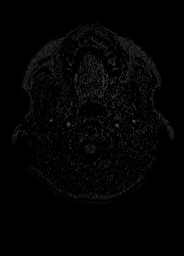
[im 21/144]
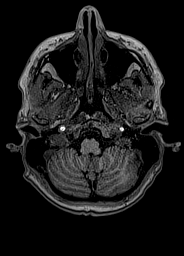
[im 41/144]
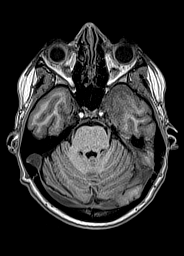
[im 62/144]
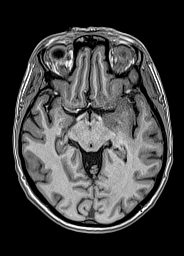
[im 82/144]
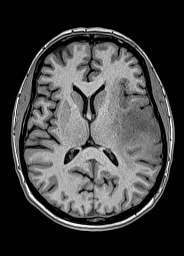
[im 103/144]
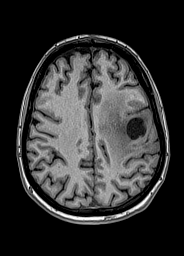
[im 123/144]
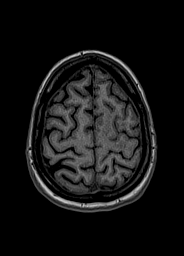
[im 144/144]
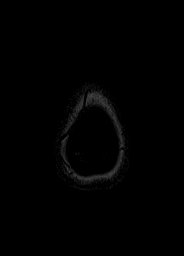

[Series 13: cor dwi_tracew · coronal · 5.0mm · 1.53mm/px · 3 of 52 slices shown]
[im 1/52]
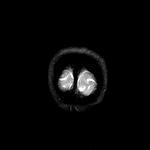
[im 26/52]
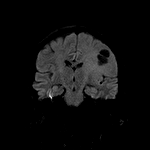
[im 52/52]
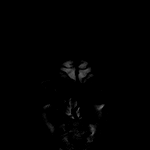

[Series 14: cor dwi_adc · coronal · 5.0mm · 1.53mm/px · 1 of 26 slices shown]
[im 1/26]
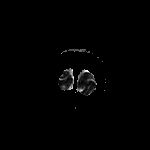

[Series 15: T2 post-contrast · coronal · 5.0mm · 0.57mm/px · 1 of 26 slices shown]
[im 1/26]
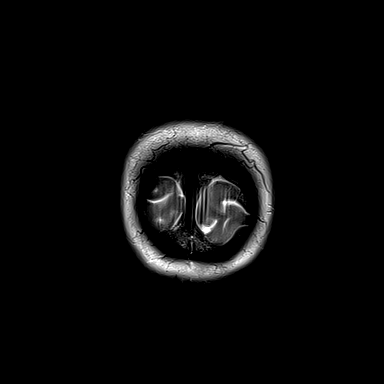

[Series 16: T1 post-contrast · axial · 1.0mm · 0.94mm/px · z∈[-60,+82]mm · 8 of 144 slices shown (1 of 3)]
[im 1/144]
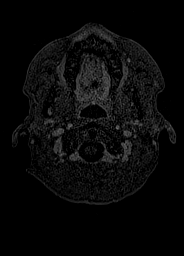
[im 21/144]
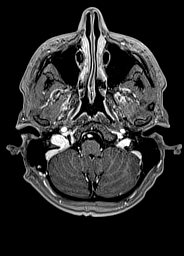
[im 41/144]
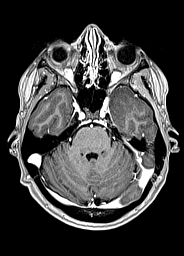
[im 62/144]
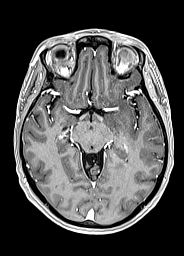
[im 82/144]
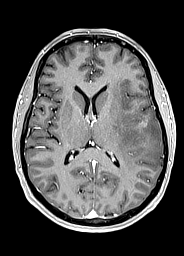
[im 103/144]
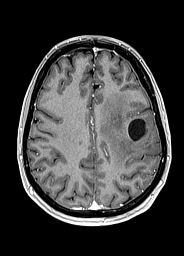
[im 123/144]
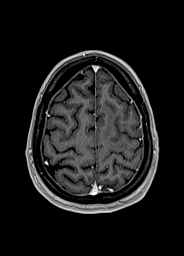
[im 144/144]
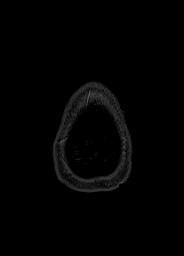

[Series 17: T1 · sagittal · 4.0mm · 0.94mm/px · 2 of 30 slices shown (3 of 4)]
[im 1/30]
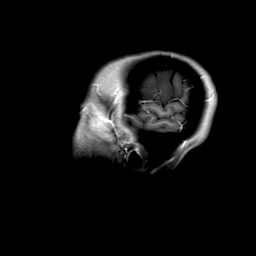
[im 30/30]
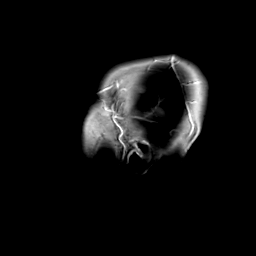

[Series 18: T1 · coronal · 4.0mm · 0.94mm/px · 2 of 30 slices shown (4 of 4)]
[im 1/30]
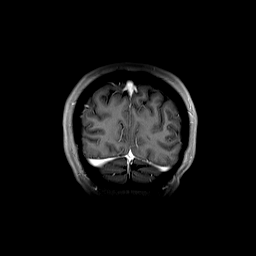
[im 30/30]
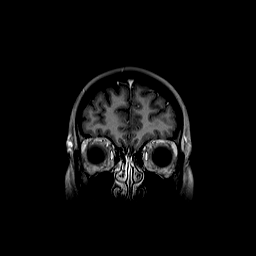

[Series 19: T1 post-contrast · coronal · 5.0mm · 0.43mm/px · 1 of 26 slices shown (2 of 3)]
[im 1/26]
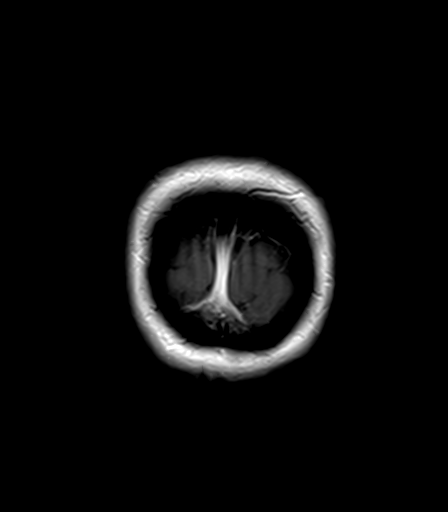

[Series 20: T1 post-contrast · sagittal · 5.0mm · 0.75mm/px · 1 of 24 slices shown (3 of 3)]
[im 1/24]
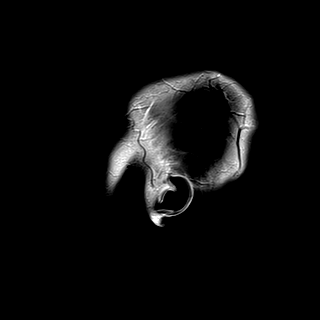

[48 of 48 positions shown; findings below may reference images not displayed]

FINDINGS: Brain: Again seen is ill-defined T2 hyperintense lesion involving
predominantly the left frontal lobe extending into the anterior left
temporal lobe, insula and basal ganglia with new extension into the
body of the corpus callosum (series 11, image 35; series 15, image
16). There is increased thickness of the heterogeneous contrast
enhancement surrounding the surgical cavity. Stable size of the
surgical cavity within within the posterior left frontal region.
Mild interval decrease in swelling, now with 2-3 mm rightward
midline shift compared to 5 mm on prior. Persistent medialization of
the left uncus.

No acute infarct, hemorrhage, hydrocephalus or extra-axial
collection.

Vascular: Normal flow voids.

Skull and upper cervical spine: No focal marrow lesion.

Sinuses/Orbits: Negative.
IMPRESSION: 1. Interval disease progression with extension of the left
frontotemporal lesion now extending to the body of the corpus
callosum and with increased contrast enhancement surrounding the
surgical cavity.
2. Mild interval decrease in swelling, now with 2-3 mm rightward
midline shift compared to 5 mm on prior. Persistent medialization of
the left uncus.

## 2020-07-06 MED ORDER — GADOBUTROL 1 MMOL/ML IV SOLN
6.0000 mL | Freq: Once | INTRAVENOUS | Status: AC | PRN
  Administered 2020-07-06: 6 mL via INTRAVENOUS

## 2020-07-09 ENCOUNTER — Inpatient Hospital Stay: Payer: BC Managed Care – PPO

## 2020-07-09 ENCOUNTER — Inpatient Hospital Stay: Payer: BC Managed Care – PPO | Attending: Internal Medicine | Admitting: Internal Medicine

## 2020-07-09 ENCOUNTER — Other Ambulatory Visit: Payer: Self-pay

## 2020-07-09 VITALS — BP 120/78 | HR 66 | Temp 97.5°F | Resp 15 | Ht 68.0 in | Wt 132.8 lb

## 2020-07-09 DIAGNOSIS — Z8043 Family history of malignant neoplasm of testis: Secondary | ICD-10-CM | POA: Diagnosis not present

## 2020-07-09 DIAGNOSIS — Z8249 Family history of ischemic heart disease and other diseases of the circulatory system: Secondary | ICD-10-CM | POA: Insufficient documentation

## 2020-07-09 DIAGNOSIS — R200 Anesthesia of skin: Secondary | ICD-10-CM | POA: Diagnosis not present

## 2020-07-09 DIAGNOSIS — R4781 Slurred speech: Secondary | ICD-10-CM | POA: Diagnosis not present

## 2020-07-09 DIAGNOSIS — R55 Syncope and collapse: Secondary | ICD-10-CM | POA: Diagnosis not present

## 2020-07-09 DIAGNOSIS — C711 Malignant neoplasm of frontal lobe: Secondary | ICD-10-CM

## 2020-07-09 DIAGNOSIS — R569 Unspecified convulsions: Secondary | ICD-10-CM | POA: Diagnosis not present

## 2020-07-09 DIAGNOSIS — R21 Rash and other nonspecific skin eruption: Secondary | ICD-10-CM | POA: Insufficient documentation

## 2020-07-09 DIAGNOSIS — Z79899 Other long term (current) drug therapy: Secondary | ICD-10-CM | POA: Diagnosis not present

## 2020-07-09 DIAGNOSIS — Z7952 Long term (current) use of systemic steroids: Secondary | ICD-10-CM | POA: Insufficient documentation

## 2020-07-09 DIAGNOSIS — Z880 Allergy status to penicillin: Secondary | ICD-10-CM | POA: Diagnosis not present

## 2020-07-09 DIAGNOSIS — Z808 Family history of malignant neoplasm of other organs or systems: Secondary | ICD-10-CM | POA: Insufficient documentation

## 2020-07-09 LAB — CMP (CANCER CENTER ONLY)
ALT: 22 U/L (ref 0–44)
AST: 20 U/L (ref 15–41)
Albumin: 4.1 g/dL (ref 3.5–5.0)
Alkaline Phosphatase: 55 U/L (ref 38–126)
Anion gap: 12 (ref 5–15)
BUN: 15 mg/dL (ref 6–20)
CO2: 22 mmol/L (ref 22–32)
Calcium: 8.9 mg/dL (ref 8.9–10.3)
Chloride: 105 mmol/L (ref 98–111)
Creatinine: 1.03 mg/dL — ABNORMAL HIGH (ref 0.44–1.00)
GFR, Estimated: >60 mL/min (ref >60)
Glucose, Bld: 98 mg/dL (ref 70–99)
Potassium: 4.4 mmol/L (ref 3.5–5.1)
Sodium: 139 mmol/L (ref 135–145)
Total Bilirubin: 0.6 mg/dL (ref 0.3–1.2)
Total Protein: 6.9 g/dL (ref 6.5–8.1)

## 2020-07-09 LAB — CBC WITH DIFFERENTIAL (CANCER CENTER ONLY)
Abs Immature Granulocytes: 0.02 10*3/uL (ref 0.00–0.07)
Basophils Absolute: 0 10*3/uL (ref 0.0–0.1)
Basophils Relative: 1 %
Eosinophils Absolute: 0 10*3/uL (ref 0.0–0.5)
Eosinophils Relative: 1 %
HCT: 40.5 % (ref 36.0–46.0)
Hemoglobin: 14.3 g/dL (ref 12.0–15.0)
Immature Granulocytes: 1 %
Lymphocytes Relative: 16 %
Lymphs Abs: 0.6 10*3/uL — ABNORMAL LOW (ref 0.7–4.0)
MCH: 33.5 pg (ref 26.0–34.0)
MCHC: 35.3 g/dL (ref 30.0–36.0)
MCV: 94.8 fL (ref 80.0–100.0)
Monocytes Absolute: 0.3 10*3/uL (ref 0.1–1.0)
Monocytes Relative: 8 %
Neutro Abs: 2.9 10*3/uL (ref 1.7–7.7)
Neutrophils Relative %: 73 %
Platelet Count: 192 10*3/uL (ref 150–400)
RBC: 4.27 MIL/uL (ref 3.87–5.11)
RDW: 11.7 % (ref 11.5–15.5)
WBC Count: 3.9 10*3/uL — ABNORMAL LOW (ref 4.0–10.5)
nRBC: 0 % (ref 0.0–0.2)

## 2020-07-09 NOTE — Progress Notes (Signed)
[facility] at [place], [place] [address] [phone]   Interval Evaluation  Date of Service: 07/09/20 Patient Name: Candace [name] Patient MRN: [id number] Patient DOB: 1980/05/21 Provider: Ventura Sellers, MD  Identifying Statement:  Linh Hedberg is a 40 y.o. female with left temporal grade 3 astrocytoma    Oncologic History: Oncology History  Astrocytoma of frontal lobe ([place])  12/08/2019 Surgery   Stereotactic R temporal biopsy by Dr. Marcello Moores.  Path demonstrates Astrocytoma, IDH-mt, WHO grade 3   01/17/2020 - 03/02/2020 Radiation Therapy   IMRT and concurrent Temodar with Dr. Isidore Moos     Biomarkers:  MGMT Unknown.  IDH 1/2 Mutated.  EGFR Unknown  TERT Unknown   Interval History:  Candace [name] present to clinic today after recent MRI brain.  She describes clear improvement in language and right hand function since dosing the steroids, although there were considerable side effects.  She has been off of decadron since 07/05/20, 4 days ago, since that time she has noted a little "clumsiness with texting only".  During dosing of steroids her sleep was very poor, and she developed a chest/face rash..  Also describes some "heaviness" in her right hand, maybe some weakness.  She dropped something out of the hand this weekend.  Otherwise energy level is fine, denies seizures, headaches, dizzy episodes.  H+P (12/23/19) Patient presented to medical attention early in September with first ever seizure.  Event was described as "sudden onset left arm numbness" followed by "face twisting, and loss of consciousness for at least 30 minutes".  CNS imaging demonstrated a large left frontal non enhancing mass consistent with primary brain tumor.  She underwent biopsy on 12/08/19 with Dr. Marcello Moores.  Following surgery she has some slurring of speech but no other significant complaints, returned to prior baseline upon discharge.  Now home, she has  had no breathrough events and no focal neurologic complaints.    Medications: Current Outpatient Medications on File Prior to Visit  Medication Sig Dispense Refill  . dexamethasone (DECADRON) 4 MG tablet Take 1 tablet (4 mg total) by mouth daily. 60 tablet 1  . levETIRAcetam (KEPPRA) 500 MG tablet Take 1 tablet (500 mg total) by mouth 2 (two) times daily. 60 tablet 3  . MAGNESIUM PO Take 1 tablet by mouth daily.    . NON FORMULARY Take 1 Dose by mouth See admin instructions. [place] Mushroom (Patient not taking: Reported on 04/09/2020)    . NON FORMULARY Take 1 Dose by mouth See admin instructions. Frankincense    . NON FORMULARY Take 1 Dose by mouth See admin instructions. Berberine (Patient not taking: No sig reported)    . NON FORMULARY Take 1 Dose by mouth See admin instructions. Melantonin (Patient not taking: Reported on 04/09/2020)    . NON FORMULARY Take 1 Dose by mouth See admin instructions. Resveratrol (Patient not taking: No sig reported)    . NON FORMULARY Take 1 Dose by mouth See admin instructions. Green Tea Extract (Patient not taking: No sig reported)    . NON FORMULARY Take 1 Dose by mouth See admin instructions. Milk Thistle (Patient not taking: No sig reported)    . NON FORMULARY Take 1 Dose by mouth See admin instructions. Soy isoflavones (Patient not taking: No sig reported)    . NON FORMULARY Take 1 Dose by mouth See admin instructions. lycopene (Patient not taking: No sig reported)    . ondansetron (ZOFRAN) 8 MG tablet Take 1  tablet (8 mg total) by mouth every 8 (eight) hours as needed for nausea or vomiting. Take 30-35mn before temodar (Patient not taking: No sig reported) 60 tablet 1  . TURMERIC PO Take 1 tablet by mouth daily.    .Marland KitchenUNABLE TO FIND Take 1 Dose by mouth daily. Med Name: Mushroom Tincture; for immune support     No current facility-administered medications on file prior to visit.    Allergies:  Allergies  Allergen Reactions  . Penicillins Rash and  Other (See Comments)    Pt does not recall so well   Past Medical History:  Past Medical History:  Diagnosis Date  . Headache    Past Surgical History:  Past Surgical History:  Procedure Laterality Date  . ABDOMINAL SURGERY    . APPLICATION OF CRANIAL NAVIGATION Left 12/08/2019   Procedure: APPLICATION OF CRANIAL NAVIGATION;  Surgeon: TVallarie Mare MD;  [facility]  Service: Neurosurgery;  Laterality: Left;  APPLICATION OF CRANIAL NAVIGATION  . FRAMELESS  BIOPSY WITH BRAINLAB Left 12/08/2019   Procedure: Left stereotactic brain biopsy with brainlab;  Surgeon: TVallarie Mare MD;  Location: [facility]  Service: Neurosurgery;  Laterality: Left;  Left stereotactic brain biopsy with brainlab  . WISDOM TOOTH EXTRACTION     Social History:  Social History   Socioeconomic History  . Marital status: Married    Spouse name: Not on file  . Number of children: Not on file  . Years of education: Not on file  . Highest education level: Not on file  Occupational History  . Not on file  Tobacco Use  . Smoking status: Never Smoker  . Smokeless tobacco: Never Used  Substance and Sexual Activity  . Alcohol use: Not Currently  . Drug use: Never  . Sexual activity: Not on file  Other Topics Concern  . Not on file  Social History Narrative  . Not on file   Social Determinants of Health   Financial Resource Strain: Not on file  Food Insecurity: Not on file  Transportation Needs: Not on file  Physical Activity: Not on file  Stress: Not on file  Social Connections: Not on file  Intimate Partner Violence: Not on file   Family History:  Family History  Problem Relation Age of Onset  . Migraines Mother   . Thyroid cancer Father   . Testicular cancer Father     Review of Systems: Constitutional: Doesn't report fevers, chills or abnormal weight loss Eyes: Doesn't report blurriness of vision Ears, nose, mouth, throat, and face: Doesn't report sore throat Respiratory: Doesn't  report cough, dyspnea or wheezes Cardiovascular: Doesn't report palpitation, chest discomfort  Gastrointestinal:  Doesn't report nausea, constipation, diarrhea GU: Doesn't report incontinence Skin: Doesn't report skin rashes Neurological: Per HPI Musculoskeletal: Doesn't report joint pain Behavioral/Psych: +anxiety  Physical Exam: Vitals:   07/09/20 0855  BP: 120/78  Pulse: 66  Resp: 15  Temp: (!) 97.5 F (36.4 C)  SpO2: 100%   KPS: 80. General: Alert, cooperative, pleasant, in no acute distress Head: Normal EENT: No conjunctival injection or scleral icterus.  Lungs: Resp effort normal Cardiac: Regular rate Abdomen: Non-distended abdomen Skin: No rashes cyanosis or petechiae. Extremities: No clubbing or edema  Neurologic Exam: Mental Status: Awake, alert, attentive to examiner. Oriented to self and environment. Expressive dysphasia Cranial Nerves: Visual acuity is grossly normal. Visual fields are full. Extra-ocular movements intact. No ptosis. Face is symmetric Motor: Tone and bulk are normal. Power is full in both arms  and legs. Reflexes are symmetric, no pathologic reflexes present.  Sensory: Intact to light touch Gait: Normal.   Labs: I have reviewed the data as listed    Component Value Date/Time   NA 136 04/23/2020 1103   K 3.7 04/23/2020 1103   CL 106 04/23/2020 1103   CO2 22 04/23/2020 1103   GLUCOSE 90 04/23/2020 1103   BUN 12 04/23/2020 1103   CREATININE 1.15 (H) 04/23/2020 1103   CALCIUM 8.7 (L) 04/23/2020 1103   PROT 7.0 04/23/2020 1103   ALBUMIN 4.1 04/23/2020 1103   AST 17 04/23/2020 1103   ALT 25 04/23/2020 1103   ALKPHOS 44 04/23/2020 1103   BILITOT 0.6 04/23/2020 1103   GFRNONAA >60 04/23/2020 1103   GFRAA >60 12/09/2019 0623   Lab Results  Component Value Date   WBC 3.9 (L) 07/09/2020   NEUTROABS 2.9 07/09/2020   HGB 14.3 07/09/2020   HCT 40.5 07/09/2020   MCV 94.8 07/09/2020   PLT 192 07/09/2020   Imaging:  [place] Clinician  Interpretation: I have personally reviewed the CNS images as listed.  My interpretation, in the context of the patient's clinical presentation, is treatment effect vs true progression  MR BRAIN W WO CONTRAST  Result Date: 07/06/2020 CLINICAL DATA:  Astrocytoma of frontal lobe. Assess treatment response. EXAM: MRI HEAD WITHOUT AND WITH CONTRAST TECHNIQUE: Multiplanar, multiecho pulse sequences of the brain and surrounding structures were obtained without and with intravenous contrast. CONTRAST:  49m GADAVIST GADOBUTROL 1 MMOL/ML IV SOLN COMPARISON:  MRI of the brain June 08, 2020 and April 03, 2020. FINDINGS: Brain: Again seen is ill-defined T2 hyperintense lesion involving predominantly the left frontal lobe extending into the anterior left temporal lobe, insula and basal ganglia with new extension into the body of the corpus callosum (series 11, image 35; series 15, image 16). There is increased thickness of the heterogeneous contrast enhancement surrounding the surgical cavity. Stable size of the surgical cavity within within the posterior left frontal region. Mild interval decrease in swelling, now with 2-3 mm rightward midline shift compared to 5 mm on prior. Persistent medialization of the left uncus. No acute infarct, hemorrhage, hydrocephalus or extra-axial collection. Vascular: Normal flow voids. Skull and upper cervical spine: No focal marrow lesion. Sinuses/Orbits: Negative. IMPRESSION: 1. Interval disease progression with extension of the left frontotemporal lesion now extending to the body of the corpus callosum and with increased contrast enhancement surrounding the surgical cavity. 2. Mild interval decrease in swelling, now with 2-3 mm rightward midline shift compared to 5 mm on prior. Persistent medialization of the left uncus. Electronically Signed   By: KPedro EarlsM.D.   On: 07/06/2020 13:42   Assessment/Plan [place] [C71.1[place]   JDinah Beers presents today with clinical stability.  MRI demonstrates overall stability of T2/FLAIR signal abnormality.  There is some question of signal encroaching the corpus callosum, although this is not easily visualized, and will be reviewed again in detail at tumor board.  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  The change in pattern of enhancement is difficult to characterize in light of lack of contrast on prior study.  Overall these changes are some combination of radio-inflammatory change, leukoencephalopathy, and progressive tumor.  She had very poor tolerance of steroids, and we are ok with her remaining off at this time.  We again discussed chemotherapy and its role as standard of care in intermediate and high grade glioma.  She continues to decline further chemo at this time, understandable given her poor clinical tolerance and laboratory abnormalities during radiochemotherapy.  We ask that Cleone Hulick return to clinic in 1-2 months following next brain MRI, or sooner as needed.  Because of concern for progression of disease, lack of systemic therapy, it is essential we monitor closely with MRI.  Will continue Keppra 713m BID.    All questions were answered. The patient knows to call the clinic with any problems, questions or concerns. No barriers to learning were detected.  The total time spent in the encounter was 40 minutes and more than 50% was on counseling and review of test results   ZVentura Sellers MD Medical Director of Neuro-Oncology [facility]04/11/22 8:58 AM

## 2020-07-10 ENCOUNTER — Ambulatory Visit (HOSPITAL_COMMUNITY): Payer: BC Managed Care – PPO

## 2020-07-11 ENCOUNTER — Encounter: Payer: Self-pay | Admitting: Internal Medicine

## 2020-07-12 ENCOUNTER — Other Ambulatory Visit: Payer: Self-pay

## 2020-07-12 ENCOUNTER — Ambulatory Visit (HOSPITAL_COMMUNITY)
Admission: RE | Admit: 2020-07-12 | Discharge: 2020-07-12 | Disposition: A | Discharge status: Home / Self Care | Payer: BC Managed Care – PPO | Source: Ambulatory Visit | Attending: Internal Medicine | Admitting: Internal Medicine

## 2020-07-12 ENCOUNTER — Encounter (HOSPITAL_COMMUNITY): Payer: Self-pay

## 2020-07-12 DIAGNOSIS — R911 Solitary pulmonary nodule: Secondary | ICD-10-CM | POA: Diagnosis present

## 2020-07-12 IMAGING — CT CT CHEST W/ CM
2 of 3 series · 15 of 36 positions shown, 18 images · IV contrast (OMNIPAQUE 300)
Comparison: [DATE]

CLINICAL DATA: History of frontal lobe astrocytoma. Follow-up lung
nodule.

EXAM:
CT CHEST WITH CONTRAST
TECHNIQUE: Multidetector CT imaging of the chest was performed during
intravenous contrast administration.
CONTRAST:  75mL OMNIPAQUE IOHEXOL 300 MG/ML  SOLN

[Series 2: axial st · axial · 0.64mm/px · z∈[+1474,+1750]mm · 12 of 164 slices shown, 15 images]
[im 13/164  mediastinal]
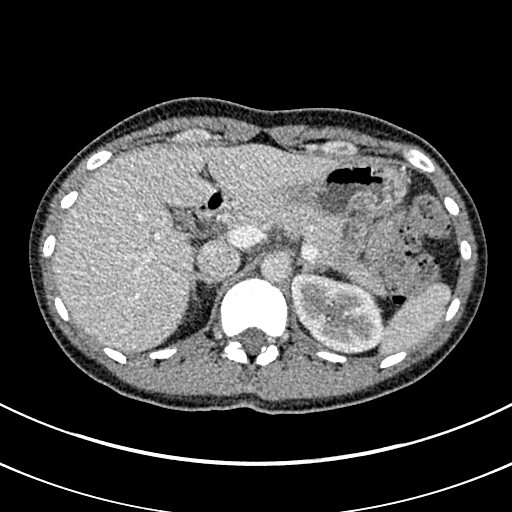
[im 13/164  lung]
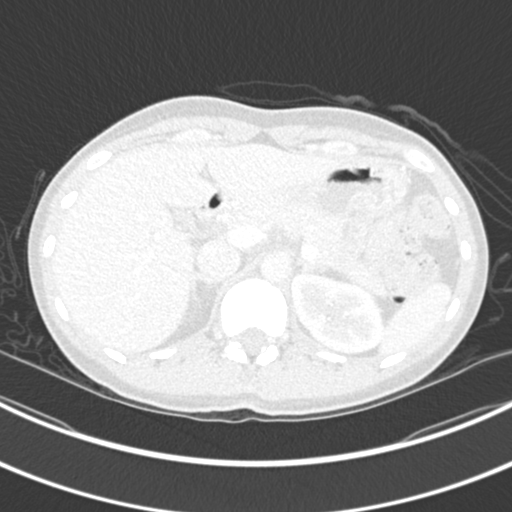
[im 25/164  lung]
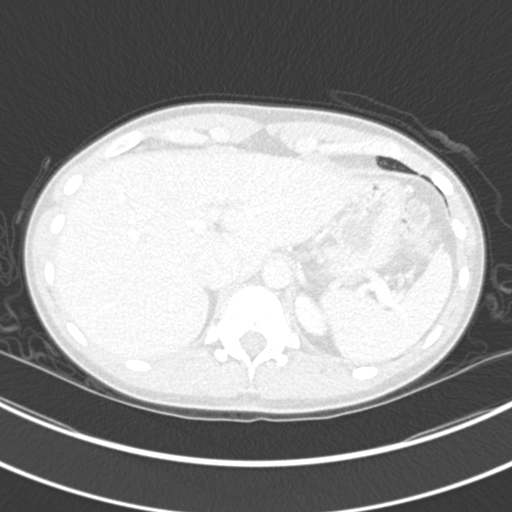
[im 37/164  lung]
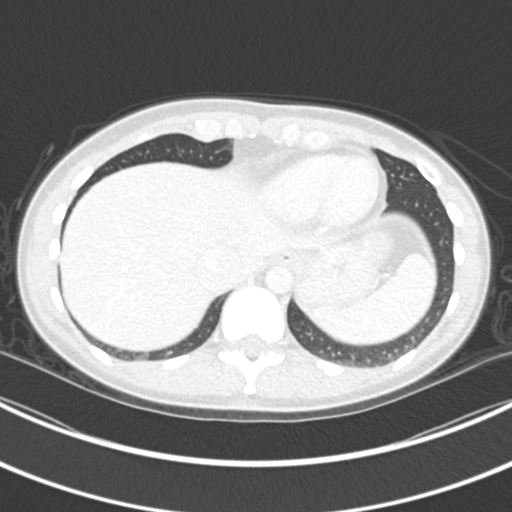
[im 49/164  lung]
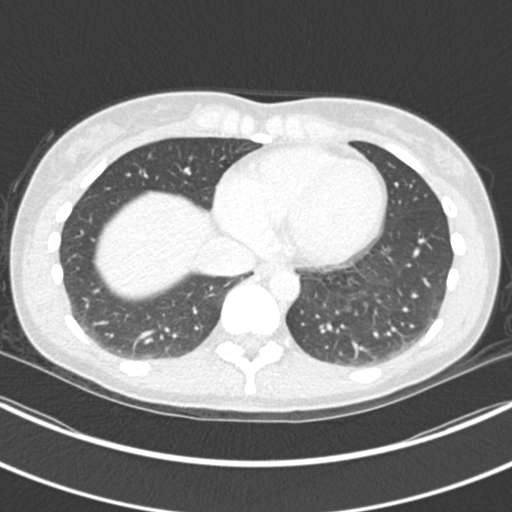
[im 61/164  mediastinal]
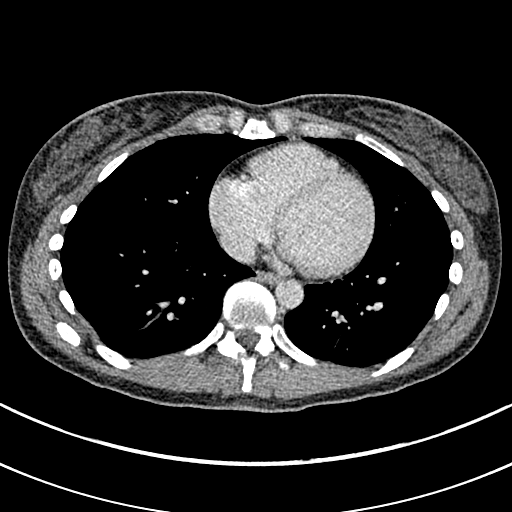
[im 61/164  lung]
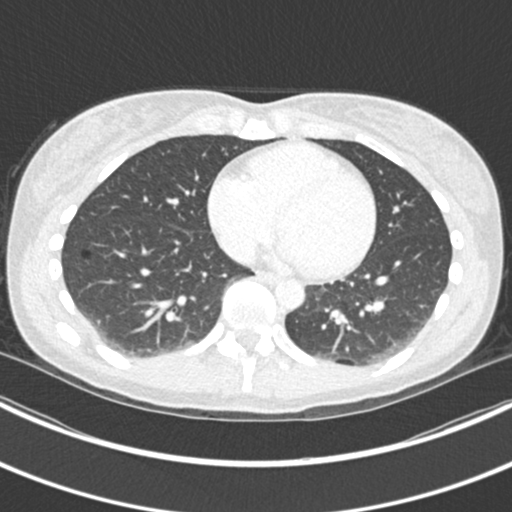
[im 73/164  lung]
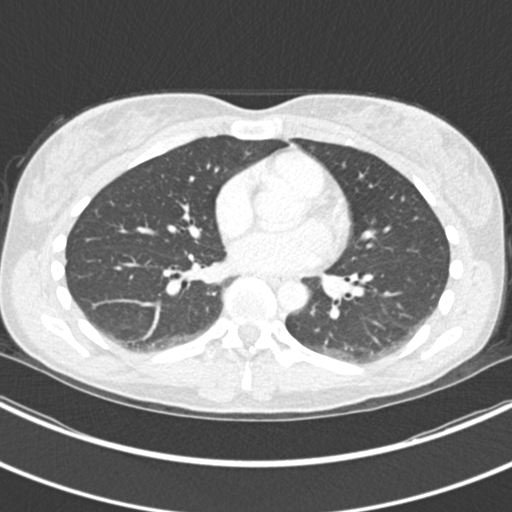
[im 91/164  lung]
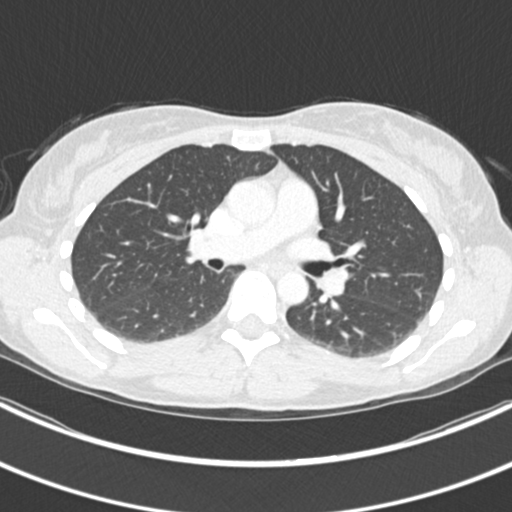
[im 103/164  lung]
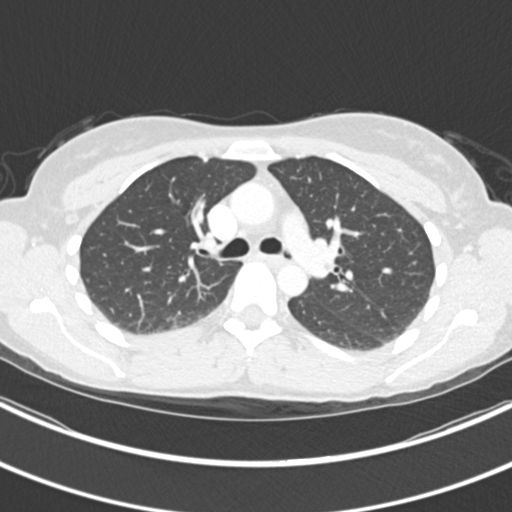
[im 115/164  mediastinal]
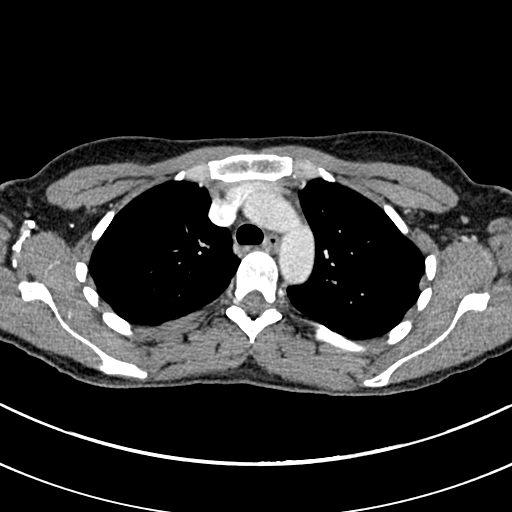
[im 115/164  lung]
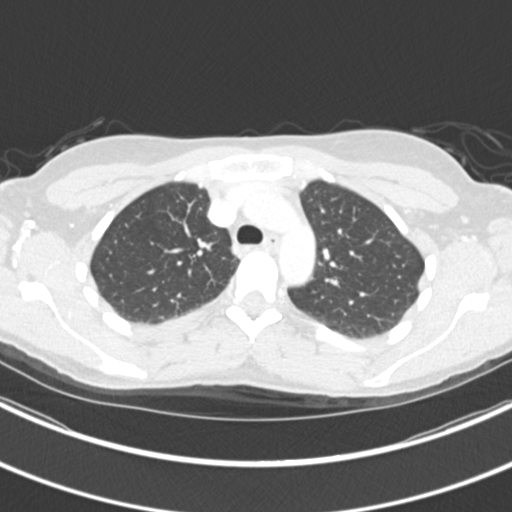
[im 127/164  lung]
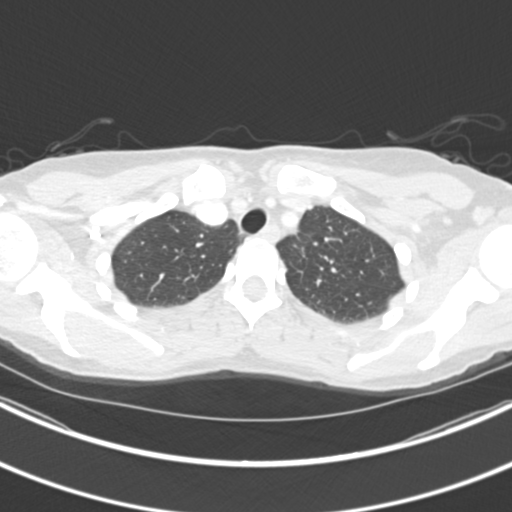
[im 139/164  lung]
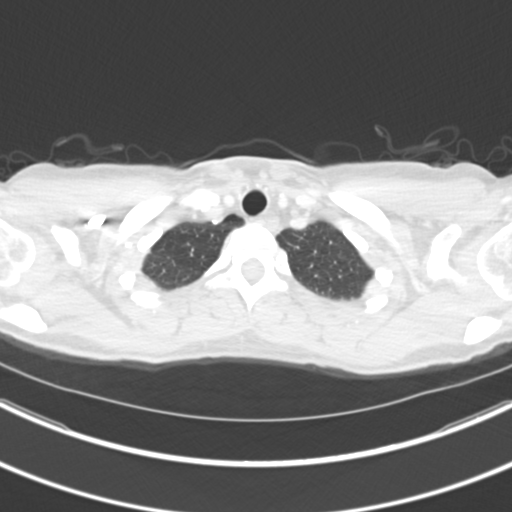
[im 151/164  lung]
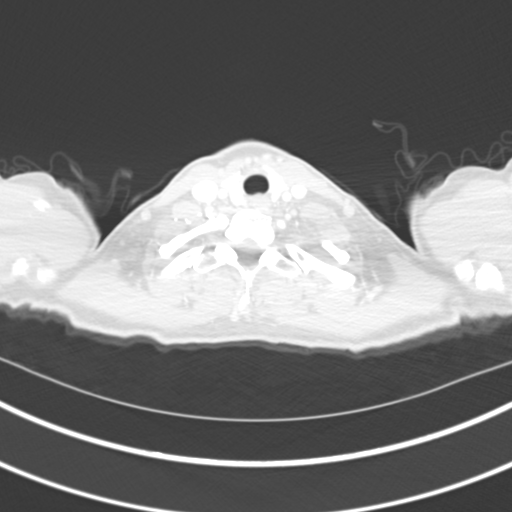

[Series 6: coronal · coronal · 0.66mm/px · 3 of 134 slices shown]
[im 27/134  lung]
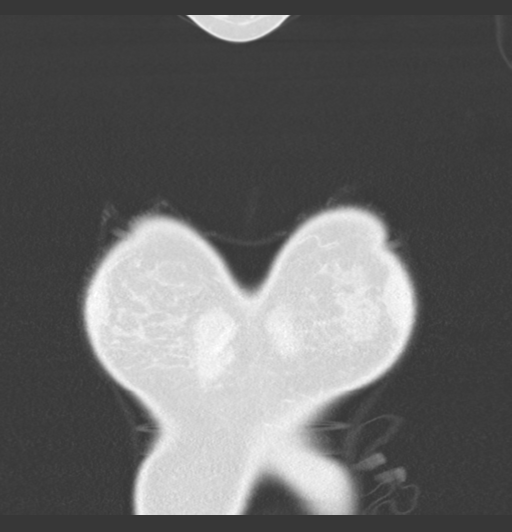
[im 54/134  lung]
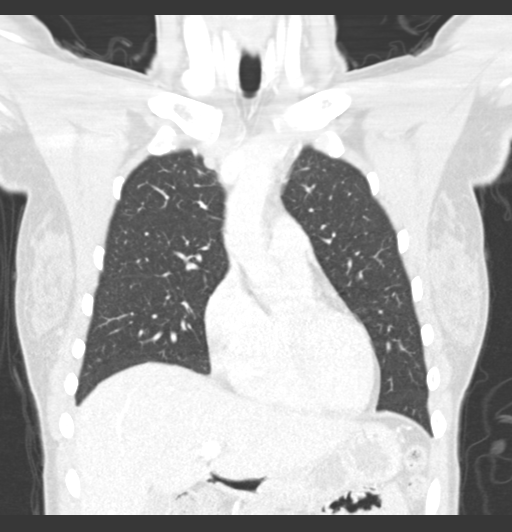
[im 80/134  lung]
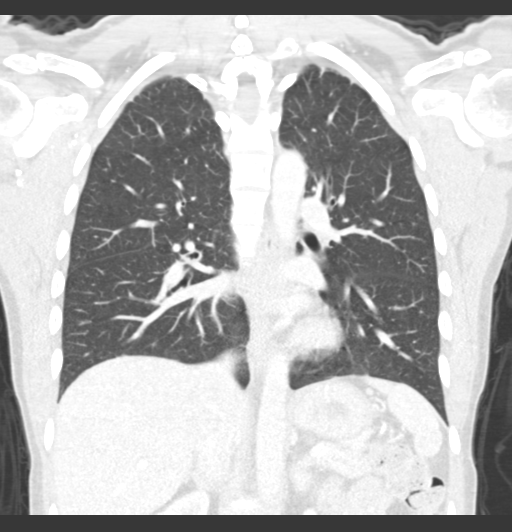

[15 of 36 positions shown; findings below may reference images not displayed]

FINDINGS: Cardiovascular: Heart is normal size. Thoracic aorta is normal in
caliber. Remaining vascular structures are unremarkable.

Mediastinum/Nodes: No mediastinal or hilar adenopathy. Remaining
mediastinal structures are normal.

Lungs/Pleura: Lungs are well inflated with no focal airspace
consolidation or effusion. Previously seen 7 mm pleural based
nodular density over the posterior right lower lobe has resolved.
This likely represents resolved focal atelectatic change. There are
no lung nodules visualized on the current exam. Airways are normal.

Upper Abdomen: No acute findings.

Musculoskeletal: No focal abnormality.
IMPRESSION: No acute cardiopulmonary disease. Interval resolution of the
previously seen pleural-based nodular density over the posterior
right lower lobe likely representing resolved focal atelectatic
change. No lung nodules visualized on the current exam.

## 2020-07-12 MED ORDER — IOHEXOL 300 MG/ML  SOLN
75.0000 mL | Freq: Once | INTRAMUSCULAR | Status: AC | PRN
  Administered 2020-07-12: 75 mL via INTRAVENOUS

## 2020-07-16 ENCOUNTER — Inpatient Hospital Stay: Payer: BC Managed Care – PPO

## 2020-07-16 ENCOUNTER — Encounter: Payer: Self-pay | Admitting: Internal Medicine

## 2020-08-02 ENCOUNTER — Encounter: Payer: Self-pay | Admitting: *Deleted

## 2020-08-06 ENCOUNTER — Other Ambulatory Visit: Payer: Self-pay | Admitting: Radiation Therapy

## 2020-08-17 ENCOUNTER — Other Ambulatory Visit: Payer: Self-pay

## 2020-08-17 ENCOUNTER — Ambulatory Visit (HOSPITAL_COMMUNITY)
Admission: RE | Admit: 2020-08-17 | Discharge: 2020-08-17 | Disposition: A | Discharge status: Home / Self Care | Payer: BC Managed Care – PPO | Source: Ambulatory Visit | Attending: Internal Medicine | Admitting: Internal Medicine

## 2020-08-17 DIAGNOSIS — C711 Malignant neoplasm of frontal lobe: Secondary | ICD-10-CM | POA: Diagnosis present

## 2020-08-17 IMAGING — MR MR HEAD WO/W CM
13 of 17 series · 38 of 48 positions shown · IV contrast (gadavist)
Comparison: MRI [DATE].

CLINICAL DATA: Brain/CNS neoplasm, assess treatment response.
Astrocytoma.

EXAM:
MRI HEAD WITHOUT AND WITH CONTRAST
TECHNIQUE: Multiplanar, multiecho pulse sequences of the brain and surrounding
structures were obtained without and with intravenous contrast.
CONTRAST:  6mL GADAVIST GADOBUTROL 1 MMOL/ML IV SOLN

[Series 5: DWI · axial · 3.0mm · 1.36mm/px · z∈[-73,+68]mm · 5 of 96 slices shown (1 of 2)]
[im 1/96]
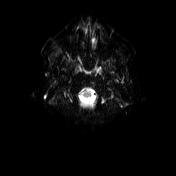
[im 24/96]
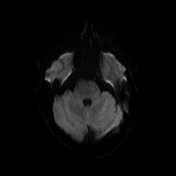
[im 48/96]
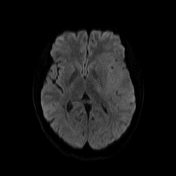
[im 72/96]
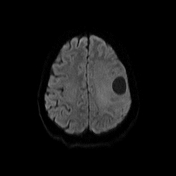
[im 96/96]
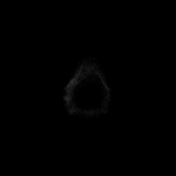

[Series 6: DWI · axial · 3.0mm · 1.36mm/px · z∈[-73,+68]mm · 2 of 48 slices shown (2 of 2)]
[im 1/48]
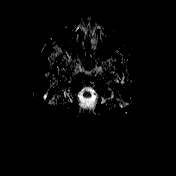
[im 48/48]
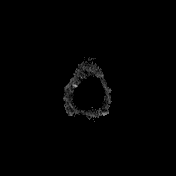

[Series 7: T1 · sagittal · 5.0mm · 0.75mm/px · 1 of 24 slices shown (1 of 2)]
[im 1/24]
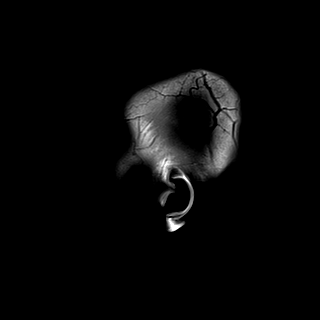

[Series 8: T2 · axial · 5.0mm · 0.62mm/px · 1 of 26 slices shown]
[im 1/26]
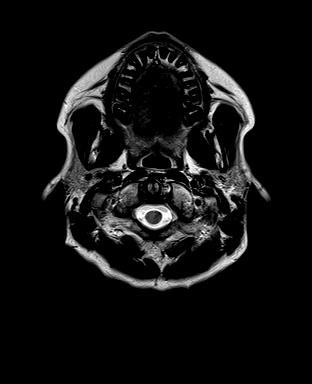

[Series 10: swi_images · axial · 3.0mm · 0.75mm/px · z∈[-92,+84]mm · 3 of 60 slices shown]
[im 1/60]
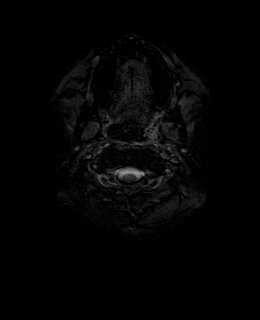
[im 30/60]
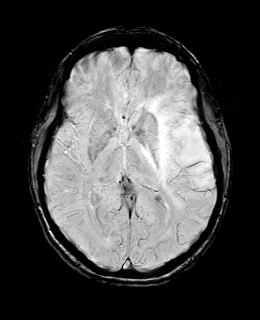
[im 60/60]
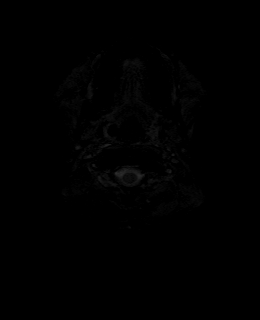

[Series 11: FLAIR · axial · 3.0mm · 0.75mm/px · z∈[-80,+73]mm · 3 of 52 slices shown]
[im 1/52]
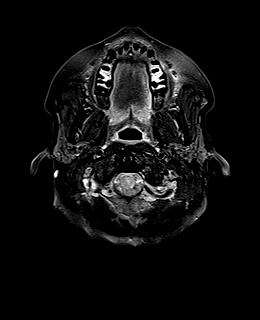
[im 26/52]
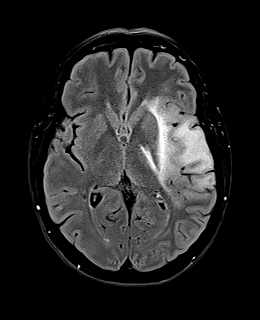
[im 52/52]
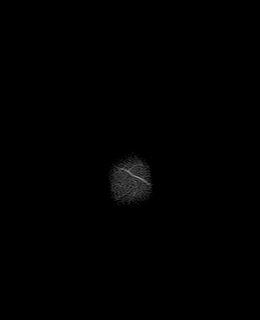

[Series 12: T1 · axial · 1.0mm · 0.94mm/px · z∈[-83,+76]mm · 8 of 160 slices shown (2 of 2)]
[im 1/160]
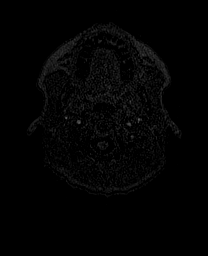
[im 23/160]
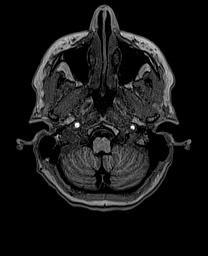
[im 46/160]
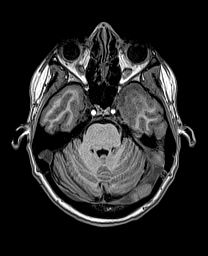
[im 69/160]
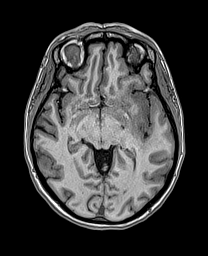
[im 91/160]
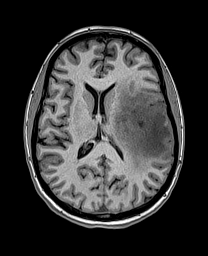
[im 114/160]
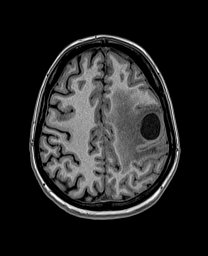
[im 137/160]
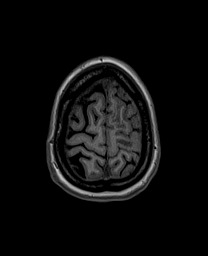
[im 160/160]
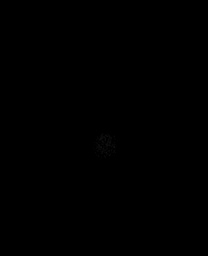

[Series 13: cor dwi_tracew · coronal · 5.0mm · 1.53mm/px · 3 of 58 slices shown]
[im 1/58]
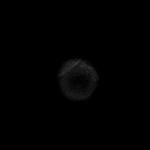
[im 29/58]
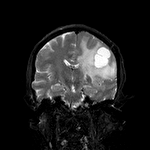
[im 58/58]
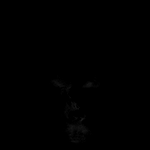

[Series 14: cor dwi_adc · coronal · 5.0mm · 1.53mm/px · 1 of 29 slices shown]
[im 1/29]
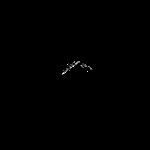

[Series 15: T2 post-contrast · coronal · 5.0mm · 0.57mm/px · 1 of 29 slices shown]
[im 1/29]
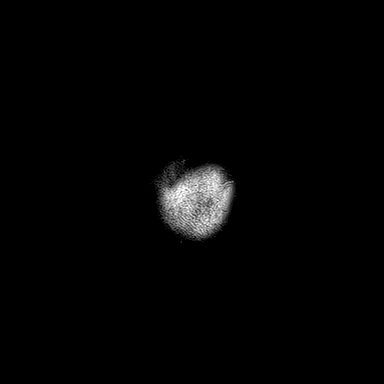

[Series 16: T1 post-contrast · axial · 1.0mm · 0.94mm/px · z∈[-83,+76]mm · 8 of 160 slices shown (1 of 3)]
[im 1/160]
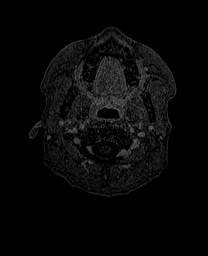
[im 23/160]
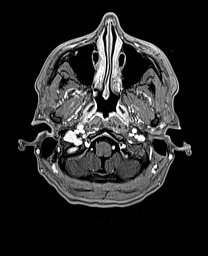
[im 46/160]
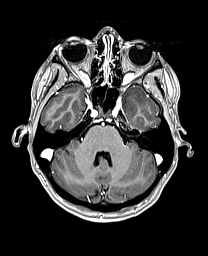
[im 69/160]
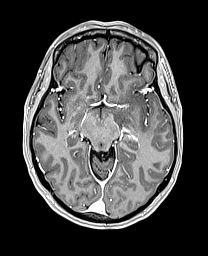
[im 91/160]
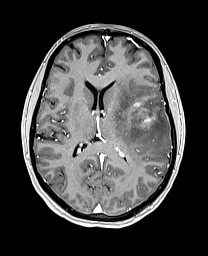
[im 114/160]
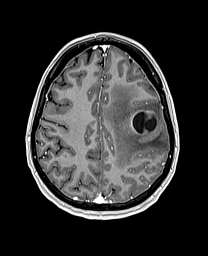
[im 137/160]
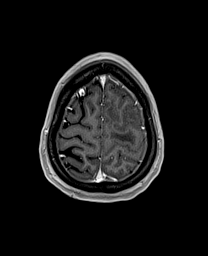
[im 160/160]
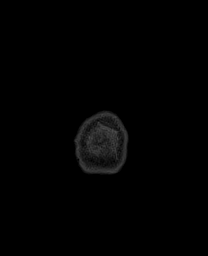

[Series 17: T1 post-contrast · coronal · 5.0mm · 0.43mm/px · 1 of 29 slices shown (2 of 3)]
[im 1/29]
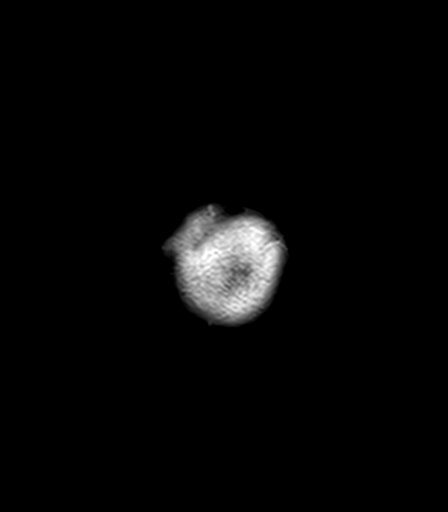

[Series 18: T1 post-contrast · sagittal · 5.0mm · 0.75mm/px · 1 of 24 slices shown (3 of 3)]
[im 1/24]
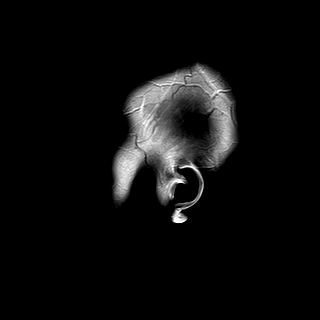

[38 of 48 positions shown; findings below may reference images not displayed]

FINDINGS: Brain: Interval increase in expansile T2/FLAIR hyperintensity that
is centered in the left frontal lobe with involvement of the
anterior parietal and temporal lobes, insula, and white matter
tracks of the basal ganglia. There is increased associated w mass
effect with approximately 5 mm of rightward midline shift at the
foramen of MAHLOKO. There is persistent medial deviation of the uncus.
Progressive partial effacement the left lateral ventricle without
hydrocephalus. Interval increase in size of the cyst seen at the
site of prior biopsy which measures up to 2.5 by 2.5 cm (previously
2.0 x 2.1 cm). Interval increase in amorphous enhancement of the
white matter surrounding this cyst, particularly along the
inferomedial aspect.

No acute infarct. No acute hemorrhage. Incidental developmental
venous anomaly in the right frontal lobe.

Vascular: Major arterial flow voids are maintained at the skull
base.

Skull and upper cervical spine: Normal marrow signal.

Sinuses/Orbits: Clear sinuses.  Unremarkable orbits.

Other: No mastoid effusions.
IMPRESSION: Continued progression of the reported astrocytoma with increased
expansile T2/FLAIR hyperintensity and enhancement, as detailed
above. Increased associated mass effect with 5 mm of rightward
midline shift.

## 2020-08-17 MED ORDER — GADOBUTROL 1 MMOL/ML IV SOLN
6.0000 mL | Freq: Once | INTRAVENOUS | Status: AC | PRN
  Administered 2020-08-17: 6 mL via INTRAVENOUS

## 2020-08-20 ENCOUNTER — Encounter: Payer: Self-pay | Admitting: Internal Medicine

## 2020-08-20 ENCOUNTER — Other Ambulatory Visit: Payer: Self-pay

## 2020-08-20 ENCOUNTER — Inpatient Hospital Stay: Payer: BC Managed Care – PPO | Attending: Internal Medicine

## 2020-08-20 ENCOUNTER — Inpatient Hospital Stay (HOSPITAL_BASED_OUTPATIENT_CLINIC_OR_DEPARTMENT_OTHER): Payer: BC Managed Care – PPO | Admitting: Internal Medicine

## 2020-08-20 ENCOUNTER — Inpatient Hospital Stay: Payer: BC Managed Care – PPO

## 2020-08-20 VITALS — BP 123/83 | HR 81 | Temp 97.8°F | Resp 17 | Ht 68.0 in | Wt 128.2 lb

## 2020-08-20 DIAGNOSIS — R569 Unspecified convulsions: Secondary | ICD-10-CM

## 2020-08-20 DIAGNOSIS — Z79899 Other long term (current) drug therapy: Secondary | ICD-10-CM | POA: Diagnosis not present

## 2020-08-20 DIAGNOSIS — R200 Anesthesia of skin: Secondary | ICD-10-CM | POA: Insufficient documentation

## 2020-08-20 DIAGNOSIS — C711 Malignant neoplasm of frontal lobe: Secondary | ICD-10-CM

## 2020-08-20 DIAGNOSIS — Z808 Family history of malignant neoplasm of other organs or systems: Secondary | ICD-10-CM | POA: Diagnosis not present

## 2020-08-20 DIAGNOSIS — Z8249 Family history of ischemic heart disease and other diseases of the circulatory system: Secondary | ICD-10-CM | POA: Insufficient documentation

## 2020-08-20 DIAGNOSIS — Z8043 Family history of malignant neoplasm of testis: Secondary | ICD-10-CM | POA: Diagnosis not present

## 2020-08-20 DIAGNOSIS — F419 Anxiety disorder, unspecified: Secondary | ICD-10-CM | POA: Diagnosis not present

## 2020-08-20 LAB — CBC WITH DIFFERENTIAL (CANCER CENTER ONLY)
Abs Immature Granulocytes: 0.03 10*3/uL (ref 0.00–0.07)
Basophils Absolute: 0 10*3/uL (ref 0.0–0.1)
Basophils Relative: 0 %
Eosinophils Absolute: 0 10*3/uL (ref 0.0–0.5)
Eosinophils Relative: 0 %
HCT: 35.9 % — ABNORMAL LOW (ref 36.0–46.0)
Hemoglobin: 13.2 g/dL (ref 12.0–15.0)
Immature Granulocytes: 0 %
Lymphocytes Relative: 9 %
Lymphs Abs: 0.8 10*3/uL (ref 0.7–4.0)
MCH: 33.5 pg (ref 26.0–34.0)
MCHC: 36.8 g/dL — ABNORMAL HIGH (ref 30.0–36.0)
MCV: 91.1 fL (ref 80.0–100.0)
Monocytes Absolute: 0.9 10*3/uL (ref 0.1–1.0)
Monocytes Relative: 11 %
Neutro Abs: 6.9 10*3/uL (ref 1.7–7.7)
Neutrophils Relative %: 80 %
Platelet Count: 219 10*3/uL (ref 150–400)
RBC: 3.94 MIL/uL (ref 3.87–5.11)
RDW: 12 % (ref 11.5–15.5)
WBC Count: 8.7 10*3/uL (ref 4.0–10.5)
nRBC: 0 % (ref 0.0–0.2)

## 2020-08-20 LAB — CMP (CANCER CENTER ONLY)
ALT: 16 U/L (ref 0–44)
AST: 14 U/L — ABNORMAL LOW (ref 15–41)
Albumin: 4 g/dL (ref 3.5–5.0)
Alkaline Phosphatase: 61 U/L (ref 38–126)
Anion gap: 7 (ref 5–15)
BUN: 14 mg/dL (ref 6–20)
CO2: 25 mmol/L (ref 22–32)
Calcium: 9.4 mg/dL (ref 8.9–10.3)
Chloride: 106 mmol/L (ref 98–111)
Creatinine: 0.99 mg/dL (ref 0.44–1.00)
GFR, Estimated: >60 mL/min (ref >60)
Glucose, Bld: 103 mg/dL — ABNORMAL HIGH (ref 70–99)
Potassium: 4.4 mmol/L (ref 3.5–5.1)
Sodium: 138 mmol/L (ref 135–145)
Total Bilirubin: 0.6 mg/dL (ref 0.3–1.2)
Total Protein: 7.5 g/dL (ref 6.5–8.1)

## 2020-08-20 MED ORDER — TRAZODONE HCL 50 MG PO TABS: 50.0000 mg | ORAL_TABLET | Freq: Every day | ORAL | 3 refills | Status: DC

## 2020-08-20 MED ORDER — DEXAMETHASONE 4 MG PO TABS: 4.0000 mg | ORAL_TABLET | Freq: Every day | ORAL | 0 refills | Status: DC

## 2020-08-20 NOTE — Progress Notes (Signed)
[place] at [place],  [address] [phone]   Interval Evaluation  Date of Service: 08/20/20 Patient Name: Candace [name] Patient MRN: [id number] Patient DOB: 1980/06/06 Provider: Ventura Sellers, MD  Identifying Statement:  Candace [name] is a 40 y.o. female with left temporal grade 3 astrocytoma    Oncologic History: Oncology History  Astrocytoma of frontal lobe ([place])  12/08/2019 Surgery   Stereotactic R temporal biopsy by Dr. Marcello Moores.  Path demonstrates Astrocytoma, IDH-mt, WHO grade 3   01/17/2020 - 03/02/2020 Radiation Therapy   IMRT and concurrent Temodar with Dr. Isidore Moos     Biomarkers:  MGMT Unknown.  IDH 1/2 Mutated.  EGFR Unknown  TERT Unknown   Interval History:  Candace [name] present to clinic today after recent MRI brain.  She and her husband describe worsening of right hand function, she is now dropping some items out of that hand, and texting only with the left hand.  She also describes some more clumsiness with the right leg, feeling she is dragging it more.  Language function varies from day to day, has difficulty with expression which is worse overall than one month prior.  Otherwise energy level is fine, denies seizures, headaches, dizzy episodes.  H+P (12/23/19) Patient presented to medical attention early in September with first ever seizure.  Event was described as "sudden onset left arm numbness" followed by "face twisting, and loss of consciousness for at least 30 minutes".  CNS imaging demonstrated a large left frontal non enhancing mass consistent with primary brain tumor.  She underwent biopsy on 12/08/19 with Dr. Marcello Moores.  Following surgery she has some slurring of speech but no other significant complaints, returned to prior baseline upon discharge.  Now home, she has had no breathrough events and no focal neurologic complaints.    Medications: Current Outpatient Medications on File  Prior to Visit  Medication Sig Dispense Refill  . levETIRAcetam (KEPPRA) 500 MG tablet Take 1 tablet (500 mg total) by mouth 2 (two) times daily. 60 tablet 3  . MAGNESIUM PO Take 1 tablet by mouth daily.    . NON FORMULARY Take 1 Dose by mouth See admin instructions. [place] Mushroom    . NON FORMULARY Take 1 Dose by mouth See admin instructions. Frankincense    . NON FORMULARY Take 1 Dose by mouth See admin instructions. Berberine    . NON FORMULARY Take 1 Dose by mouth See admin instructions. Melantonin    . NON FORMULARY Take 1 Dose by mouth See admin instructions. Resveratrol    . NON FORMULARY Take 1 Dose by mouth See admin instructions. Green Tea Extract    . NON FORMULARY Take 1 Dose by mouth See admin instructions. Milk Thistle    . NON FORMULARY Take 1 Dose by mouth See admin instructions. Soy isoflavones    . NON FORMULARY Take 1 Dose by mouth See admin instructions. lycopene    . NON FORMULARY Take 5 capsules by mouth daily. Serapeptase enzyme supplement    . TURMERIC PO Take 1 tablet by mouth daily.    Marland Kitchen UNABLE TO FIND Take 1 Dose by mouth daily. Med Name: Mushroom Tincture; for immune support    . ondansetron (ZOFRAN) 8 MG tablet Take 1 tablet (8 mg total) by mouth every 8 (eight) hours as needed for nausea or vomiting. Take 30-106mn before temodar (Patient not taking: No sig reported) 60 tablet 1   No current facility-administered medications on file prior  to visit.    Allergies:  Allergies  Allergen Reactions  . Penicillins Rash and Other (See Comments)    Pt does not recall so well   Past Medical History:  Past Medical History:  Diagnosis Date  . Headache    Past Surgical History:  Past Surgical History:  Procedure Laterality Date  . ABDOMINAL SURGERY    . APPLICATION OF CRANIAL NAVIGATION Left 12/08/2019   Procedure: APPLICATION OF CRANIAL NAVIGATION;  Surgeon: Vallarie Mare, MD;  Location: [facility];  Service: Neurosurgery;  Laterality: Left;  APPLICATION OF  CRANIAL NAVIGATION  . FRAMELESS  BIOPSY WITH BRAINLAB Left 12/08/2019   Procedure: Left stereotactic brain biopsy with brainlab;  Surgeon: Vallarie Mare, MD;  Location: [facility];  Service: Neurosurgery;  Laterality: Left;  Left stereotactic brain biopsy with brainlab  . WISDOM TOOTH EXTRACTION     Social History:  Social History   Socioeconomic History  . Marital status: Married    Spouse name: Not on file  . Number of children: Not on file  . Years of education: Not on file  . Highest education level: Not on file  Occupational History  . Not on file  Tobacco Use  . Smoking status: Never Smoker  . Smokeless tobacco: Never Used  Substance and Sexual Activity  . Alcohol use: Not Currently  . Drug use: Never  . Sexual activity: Not on file  Other Topics Concern  . Not on file  Social History Narrative  . Not on file   Social Determinants of Health   Financial Resource Strain: Not on file  Food Insecurity: Not on file  Transportation Needs: Not on file  Physical Activity: Not on file  Stress: Not on file  Social Connections: Not on file  Intimate Partner Violence: Not on file   Family History:  Family History  Problem Relation Age of Onset  . Migraines Mother   . Thyroid cancer Father   . Testicular cancer Father     Review of Systems: Constitutional: Doesn't report fevers, chills or abnormal weight loss Eyes: Doesn't report blurriness of vision Ears, nose, mouth, throat, and face: Doesn't report sore throat Respiratory: Doesn't report cough, dyspnea or wheezes Cardiovascular: Doesn't report palpitation, chest discomfort  Gastrointestinal:  Doesn't report nausea, constipation, diarrhea GU: Doesn't report incontinence Skin: Doesn't report skin rashes Neurological: Per HPI Musculoskeletal: Doesn't report joint pain Behavioral/Psych: +anxiety  Physical Exam: Vitals:   08/20/20 1224  BP: 123/83  Pulse: 81  Resp: 17  Temp: 97.8 F (36.6 C)  SpO2: 100%    KPS: 80. General: Alert, cooperative, pleasant, in no acute distress Head: Normal EENT: No conjunctival injection or scleral icterus.  Lungs: Resp effort normal Cardiac: Regular rate Abdomen: Non-distended abdomen Skin: No rashes cyanosis or petechiae. Extremities: No clubbing or edema  Neurologic Exam: Mental Status: Awake, alert, attentive to examiner. Oriented to self and environment. Expressive dysphasia Cranial Nerves: Visual acuity is grossly normal. Visual fields are full. Extra-ocular movements intact. No ptosis. Face is symmetric Motor: Tone and bulk are normal. Fine motor impairment right hand only, no drift. Reflexes are symmetric, no pathologic reflexes present.  Sensory: Intact to light touch Gait: Normal.   Labs: I have reviewed the data as listed    Component Value Date/Time   NA 138 08/20/2020 1205   K 4.4 08/20/2020 1205   CL 106 08/20/2020 1205   CO2 25 08/20/2020 1205   GLUCOSE 103 (H) 08/20/2020 1205   BUN 14 08/20/2020 1205  CREATININE 0.99 08/20/2020 1205   CALCIUM 9.4 08/20/2020 1205   PROT 7.5 08/20/2020 1205   ALBUMIN 4.0 08/20/2020 1205   AST 14 (L) 08/20/2020 1205   ALT 16 08/20/2020 1205   ALKPHOS 61 08/20/2020 1205   BILITOT 0.6 08/20/2020 1205   GFRNONAA >60 08/20/2020 1205   GFRAA >60 12/09/2019 0623   Lab Results  Component Value Date   WBC 8.7 08/20/2020   NEUTROABS 6.9 08/20/2020   HGB 13.2 08/20/2020   HCT 35.9 (L) 08/20/2020   MCV 91.1 08/20/2020   PLT 219 08/20/2020   Imaging:  [facility] Clinician Interpretation: I have personally reviewed the CNS images as listed.  My interpretation, in the context of the patient's clinical presentation, is treatment effect vs true progression  MR BRAIN W WO CONTRAST  Result Date: 08/18/2020 CLINICAL DATA:  Brain/CNS neoplasm, assess treatment response. Astrocytoma. EXAM: MRI HEAD WITHOUT AND WITH CONTRAST TECHNIQUE: Multiplanar, multiecho pulse sequences of the brain and surrounding  structures were obtained without and with intravenous contrast. CONTRAST:  12m GADAVIST GADOBUTROL 1 MMOL/ML IV SOLN COMPARISON:  MRI July 06, 2020. FINDINGS: Brain: Interval increase in expansile T2/FLAIR hyperintensity that is centered in the left frontal lobe with involvement of the anterior parietal and temporal lobes, insula, and white matter tracks of the basal ganglia. There is increased associated w mass effect with approximately 5 mm of rightward midline shift at the foramen of Monro. There is persistent medial deviation of the uncus. Progressive partial effacement the left lateral ventricle without hydrocephalus. Interval increase in size of the cyst seen at the site of prior biopsy which measures up to 2.5 by 2.5 cm (previously 2.0 x 2.1 cm). Interval increase in amorphous enhancement of the white matter surrounding this cyst, particularly along the inferomedial aspect. No acute infarct. No acute hemorrhage. Incidental developmental venous anomaly in the right frontal lobe. Vascular: Major arterial flow voids are maintained at the skull base. Skull and upper cervical spine: Normal marrow signal. Sinuses/Orbits: Clear sinuses.  Unremarkable orbits. Other: No mastoid effusions. IMPRESSION: Continued progression of the reported astrocytoma with increased expansile T2/FLAIR hyperintensity and enhancement, as detailed above. Increased associated mass effect with 5 mm of rightward midline shift. Electronically Signed   By: FMargaretha SheffieldMD   On: 08/18/2020 08:48   Assessment/Plan Astrocytoma of frontal lobe ([place] [C71.1]   JDinah Beerspresents with symptoms of clinical progression localizing to the dominant left frontal lobe.  The brain MRI demonstrates modest progression on both enhancing and non-enhancing T2 sequences, with increased mass effect.  Changes are noted in the context of lack of steroids compared to dosing ~426mdaily prior to last month's scan.   Overall these changes are  suspicious for tumor progression, but still may reflect combination of radio-inflammatory change, leukoencephalopathy, and progressive tumor.  We again discussed chemotherapy and its role as standard of care in intermediate and high grade glioma.  We recommended utilizing metronomic Temozolomide as first line option, given poor tolerance of higher dose Temodar and cytopenias.    If cytotoxic chemotherapy is not tolerated of preferred, could consider Ivosidenib as alternate agent:  [place] IK, [place] BM, Tou[place] M, MaRoni BreadI, Ho[place]Co[place], Lu M, BoMansfieldStHighland ParkPaDay ValleyClNarberthWe[place]restviewYMissouriIvosidenib in Isocitrate Dehydrogenase 1-Mutated Advanced Glioma. J Clin Oncol. 2020 Oct 10;38(29):3398-3406.  Third option would be treat aggressively for  possible radio-inflammatory change with dexamethasone; if strong clinical improvement over 1 week, could consider deferring immediate systemic therapy and repeating another MRI.  In addition, could consider shorter term PET-CT for more advanced metabolic profiling.  Kimya Mccahill will discuss her preferences with her husband and get back to [place] with their preferred path forward, based on above recommendations/options.  Will continue Keppra 775m BID.    All questions were answered. The patient knows to call the clinic with any problems, questions or concerns. No barriers to learning were detected.  The total time spent in the encounter was 40 minutes and more than 50% was on counseling and review of test results   ZVentura Sellers MD Medical Director of Neuro-Oncology [facility]Car[facility]oscopyat WJunction City05/23/22 3:36 PM

## 2020-08-21 ENCOUNTER — Telehealth: Payer: Self-pay | Admitting: Internal Medicine

## 2020-08-21 NOTE — Telephone Encounter (Signed)
Scheduled appt per 5/23 sch msg. Pt aware.

## 2020-08-23 ENCOUNTER — Other Ambulatory Visit: Payer: Self-pay | Admitting: Internal Medicine

## 2020-08-23 ENCOUNTER — Encounter: Payer: Self-pay | Admitting: Internal Medicine

## 2020-08-23 MED ORDER — ZOLPIDEM TARTRATE 5 MG PO TABS: 5.0000 mg | ORAL_TABLET | Freq: Every evening | ORAL | 0 refills | Status: DC | PRN

## 2020-08-28 ENCOUNTER — Inpatient Hospital Stay (HOSPITAL_BASED_OUTPATIENT_CLINIC_OR_DEPARTMENT_OTHER): Payer: BC Managed Care – PPO | Admitting: Internal Medicine

## 2020-08-28 DIAGNOSIS — C711 Malignant neoplasm of frontal lobe: Secondary | ICD-10-CM

## 2020-08-28 NOTE — Progress Notes (Signed)
I connected with Candace [name] on 08/28/20 at  9:00 AM EDT by telephone visit and verified that I am speaking with the correct person using two identifiers.  I discussed the limitations, risks, security and privacy concerns of performing an evaluation and management service by telemedicine and the availability of in-person appointments. I also discussed with the patient that there may be a patient responsible charge related to this service. The patient expressed understanding and agreed to proceed.  Other persons participating in the visit and their role in the encounter:  spouse  Patient's location:  home  Provider's location:  office  Chief Complaint:  Astrocytoma of frontal lobe ([place]) - Plan: MR BRAIN W WO CONTRAST  History of Present Ilness: Candace [name] describes considerable improvement in right hand/arm and leg weakness since dosing the decadron starting 7 days ago.  She also feels "mentally sharper", although still having difficulty getting words out like prior.  Overall language might be a little better than last week, not improved as much as the weakness (she is now texting with the right hand). Observations: Mixed dysphasia motor>sensory, stable from prior Assessment and Plan: Astrocytoma of frontal lobe ([place]) - Plan: MR BRAIN W WO CONTRAST  Rapid response to corticosteroids given additional support to inflammatory hypothesis.  Because of progressive disease, focal symptoms, we will recommend reimaging with contrast enhanced brain MRI in 1 month.   In meantime, she should continue on decadron 4mg  daily, at least for the two weeks prior to the imaging study.  If further progression, will transition to systemic therapy as discussed on 5/24.  Follow Up Instructions: RTC in 1 month following MRI brain.  I discussed the assessment and treatment plan with the patient.  The patient was provided an opportunity to ask questions and all were answered.  The patient agreed with the plan  and demonstrated understanding of the instructions.    The patient was advised to call back or seek an in-person evaluation if the symptoms worsen or if the condition fails to improve as anticipated.  I provided 5-10 minutes of non-face-to-face time during this enocunter.  Ventura Sellers, MD   I provided 15 minutes of non face-to-face telephone visit time during this encounter, and > 50% was spent counseling as documented under my assessment & plan.

## 2020-08-29 ENCOUNTER — Encounter: Payer: Self-pay | Admitting: Internal Medicine

## 2020-08-31 ENCOUNTER — Encounter: Payer: Self-pay | Admitting: Internal Medicine

## 2020-09-03 ENCOUNTER — Other Ambulatory Visit: Payer: Self-pay | Admitting: Internal Medicine

## 2020-09-03 ENCOUNTER — Encounter: Payer: Self-pay | Admitting: Internal Medicine

## 2020-09-03 NOTE — Telephone Encounter (Signed)
Rx refill

## 2020-09-06 ENCOUNTER — Other Ambulatory Visit: Payer: Self-pay | Admitting: Radiation Therapy

## 2020-09-18 ENCOUNTER — Encounter: Payer: Self-pay | Admitting: Internal Medicine

## 2020-09-21 ENCOUNTER — Other Ambulatory Visit: Payer: Self-pay

## 2020-09-21 ENCOUNTER — Ambulatory Visit (HOSPITAL_COMMUNITY)
Admission: RE | Admit: 2020-09-21 | Discharge: 2020-09-21 | Disposition: A | Discharge status: Home / Self Care | Payer: BC Managed Care – PPO | Source: Ambulatory Visit | Attending: Internal Medicine | Admitting: Internal Medicine

## 2020-09-21 ENCOUNTER — Encounter: Payer: Self-pay | Admitting: Internal Medicine

## 2020-09-21 DIAGNOSIS — C711 Malignant neoplasm of frontal lobe: Secondary | ICD-10-CM | POA: Diagnosis present

## 2020-09-21 IMAGING — MR MR HEAD WO/W CM
17 series · 48 of 48 positions shown · IV contrast (gadavist)
Comparison: MRI head [DATE]

CLINICAL DATA: Grade 3 astrocytoma.  Assess treatment response

EXAM:
MRI HEAD WITHOUT AND WITH CONTRAST
TECHNIQUE: Multiplanar, multiecho pulse sequences of the brain and surrounding
structures were obtained without and with intravenous contrast.
CONTRAST:  5mL GADAVIST GADOBUTROL 1 MMOL/ML IV SOLN

[Series 5: DWI · axial · 3.0mm · 1.36mm/px · z∈[-53,+88]mm · 4 of 96 slices shown (1 of 2)]
[im 1/96]
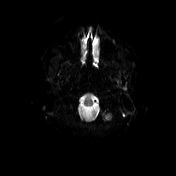
[im 32/96]
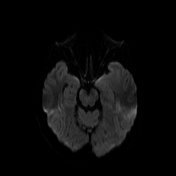
[im 64/96]
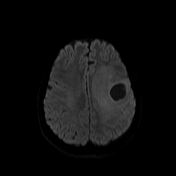
[im 96/96]
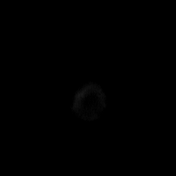

[Series 6: DWI · axial · 3.0mm · 1.36mm/px · z∈[-53,+88]mm · 2 of 48 slices shown (2 of 2)]
[im 1/48]
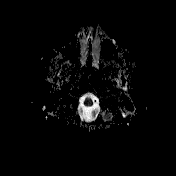
[im 48/48]
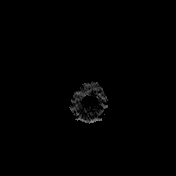

[Series 7: T1 · sagittal · 5.0mm · 0.75mm/px · 1 of 24 slices shown (1 of 4)]
[im 1/24]
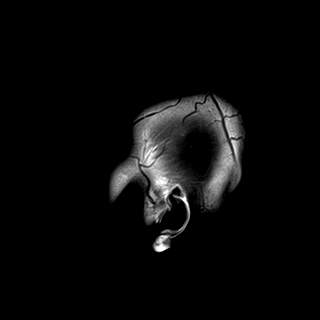

[Series 8: T2 · axial · 5.0mm · 0.62mm/px · 1 of 26 slices shown]
[im 1/26]
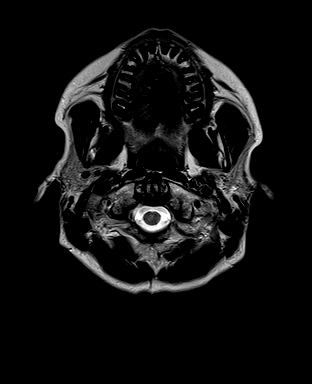

[Series 9: swi_images · axial · 3.0mm · 0.75mm/px · z∈[-91,+122]mm · 4 of 72 slices shown]
[im 1/72]
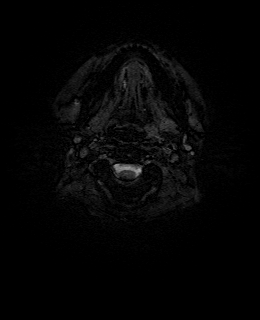
[im 24/72]
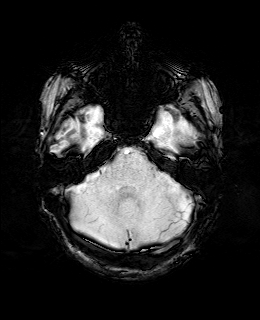
[im 48/72]
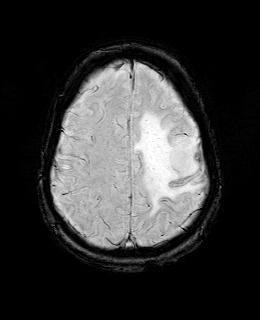
[im 72/72]
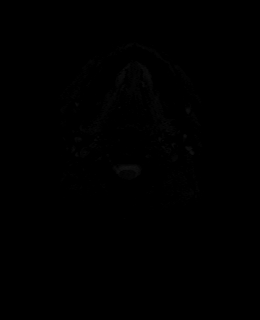

[Series 11: FLAIR · axial · 3.0mm · 0.75mm/px · z∈[-61,+92]mm · 3 of 52 slices shown]
[im 1/52]
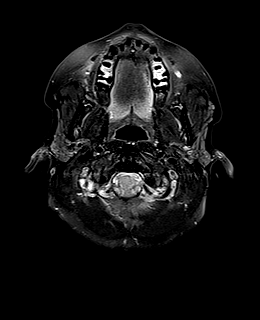
[im 26/52]
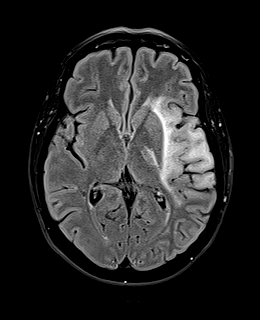
[im 52/52]
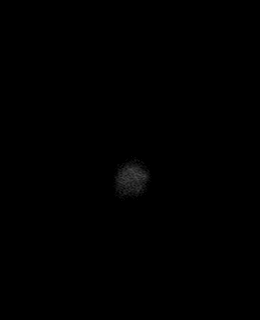

[Series 12: T1 · axial · 1.0mm · 0.94mm/px · z∈[-56,+87]mm · 8 of 144 slices shown (2 of 4)]
[im 1/144]
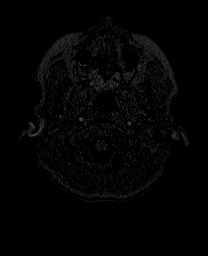
[im 21/144]
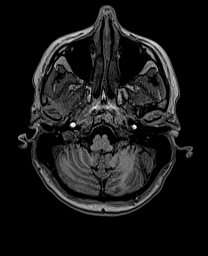
[im 41/144]
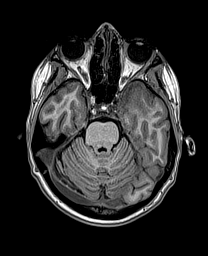
[im 62/144]
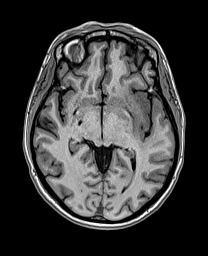
[im 82/144]
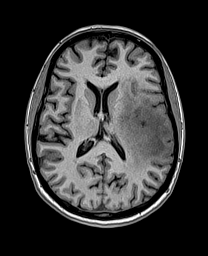
[im 103/144]
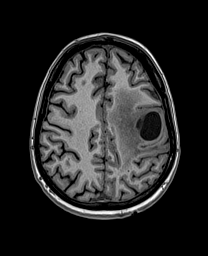
[im 123/144]
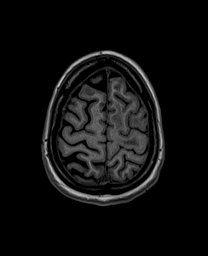
[im 144/144]
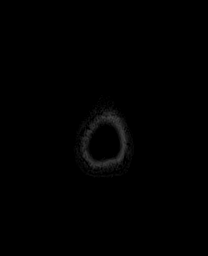

[Series 13: cor dwi_tracew · coronal · 5.0mm · 1.53mm/px · 3 of 56 slices shown]
[im 1/56]
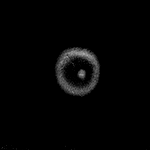
[im 28/56]
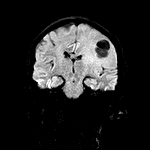
[im 56/56]
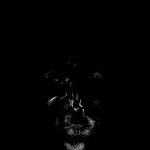

[Series 14: cor dwi_adc · coronal · 5.0mm · 1.53mm/px · 1 of 28 slices shown]
[im 1/28]
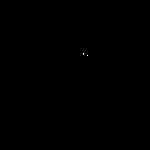

[Series 15: resolve dwi_tracew · axial · 5.0mm · 1.67mm/px · z∈[-63,+93]mm · 3 of 50 slices shown]
[im 1/50]
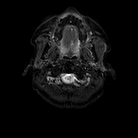
[im 25/50]
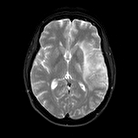
[im 50/50]
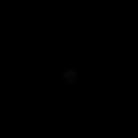

[Series 16: resolve dwi_adc · axial · 5.0mm · 1.67mm/px · 1 of 24 slices shown]
[im 1/24]
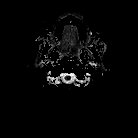

[Series 17: T2 post-contrast · coronal · 5.0mm · 0.57mm/px · 2 of 32 slices shown]
[im 1/32]
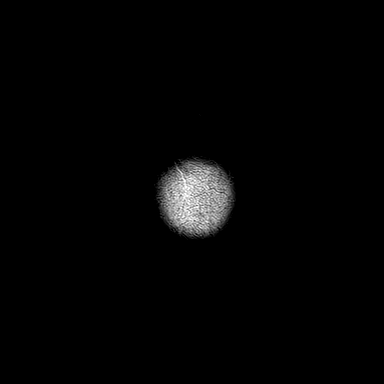
[im 32/32]
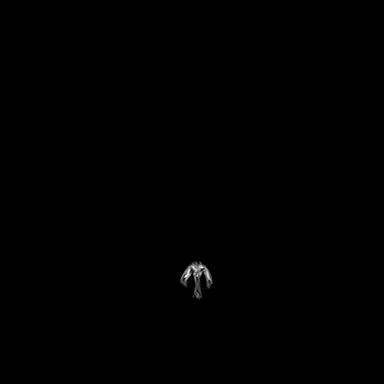

[Series 18: T1 post-contrast · axial · 1.0mm · 0.94mm/px · z∈[-56,+87]mm · 8 of 144 slices shown (1 of 3)]
[im 1/144]
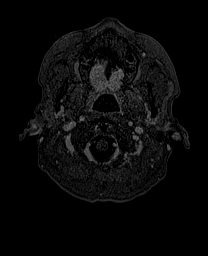
[im 21/144]
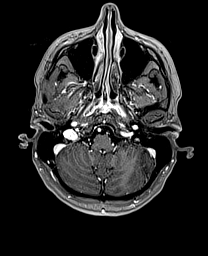
[im 41/144]
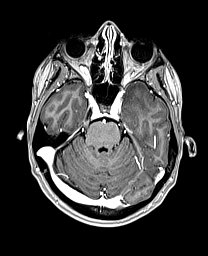
[im 62/144]
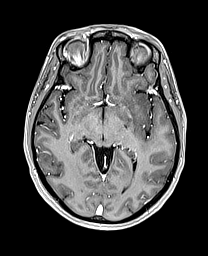
[im 82/144]
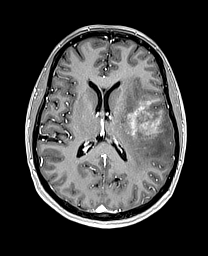
[im 103/144]
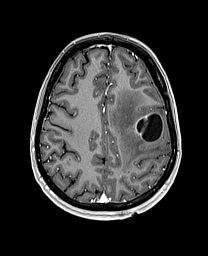
[im 123/144]
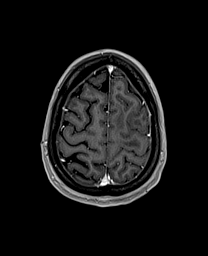
[im 144/144]
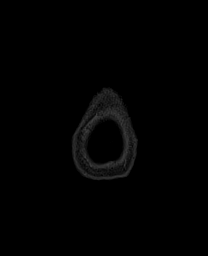

[Series 19: T1 · sagittal · 4.0mm · 0.94mm/px · 2 of 30 slices shown (3 of 4)]
[im 1/30]
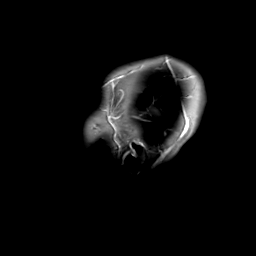
[im 30/30]
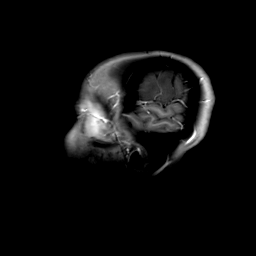

[Series 20: T1 · coronal · 4.0mm · 0.94mm/px · 2 of 30 slices shown (4 of 4)]
[im 1/30]
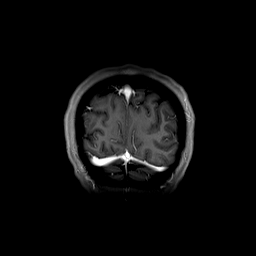
[im 30/30]
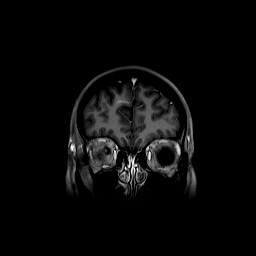

[Series 21: T1 post-contrast · coronal · 5.0mm · 0.43mm/px · 2 of 32 slices shown (2 of 3)]
[im 1/32]
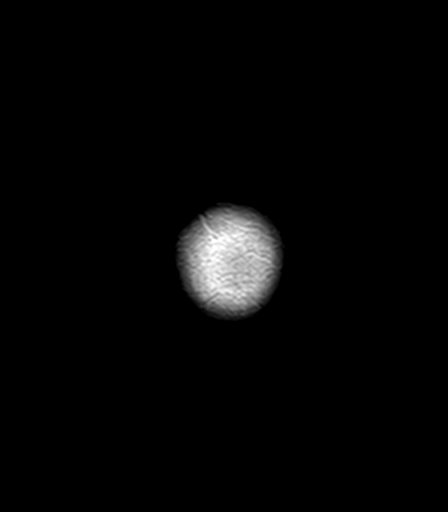
[im 32/32]
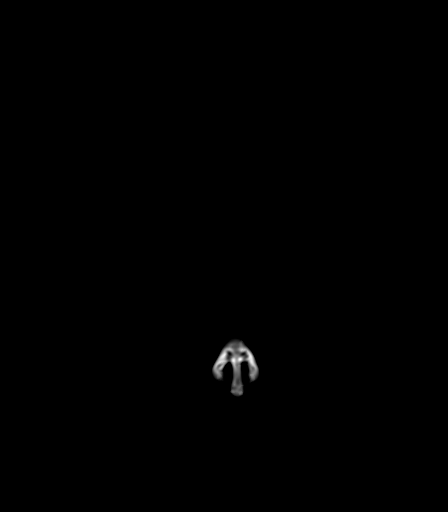

[Series 22: T1 post-contrast · sagittal · 5.0mm · 0.75mm/px · 1 of 24 slices shown (3 of 3)]
[im 1/24]
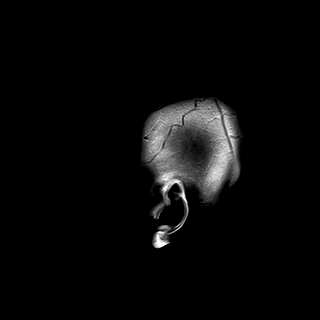

[48 of 48 positions shown; findings below may reference images not displayed]

FINDINGS: Brain: Large infiltrating mass lesion overall appears unchanged.
There is nonenhancing infiltrating mass in the left medial temporal
lobe and left anterior temporal lobe. There is infiltrating mass
extending through the insula and into the left middle and posterior
frontal lobe. Septated cyst in the left posterior temporal lobe is
unchanged in size measuring approximately 27 x 24 by 31 mm.
Surrounding the cyst, there is infiltrating T2 and FLAIR
hyperintensity which is unchanged. There is also irregular patchy
enhancement around the cyst especially medially and inferior to the
cyst which appears stable from the prior study. There is mass-effect
on the left lateral ventricle. 5 mm midline shift to the right is
unchanged. No hydrocephalus.

Negative for acute infarct. Developmental venous anomaly in the
right frontal lobe with evidence of hemosiderin staining from prior
hemorrhage unchanged.

Vascular: Normal arterial flow voids.

Skull and upper cervical spine: Biopsy tract on the left.

Sinuses/Orbits: Negative

Other: None
IMPRESSION: Infiltrating mass in the left cerebral hemisphere involving the
temporal and frontal lobe is stable. Tumor cyst is stable. Tumor
enhancement is stable. Mass-effect with 5 mm midline shift to the
right also stable.

## 2020-09-21 MED ORDER — GADOBUTROL 1 MMOL/ML IV SOLN
5.0000 mL | Freq: Once | INTRAVENOUS | Status: AC | PRN
  Administered 2020-09-21: 5 mL via INTRAVENOUS

## 2020-09-24 ENCOUNTER — Inpatient Hospital Stay: Payer: BC Managed Care – PPO

## 2020-09-24 ENCOUNTER — Inpatient Hospital Stay: Payer: BC Managed Care – PPO | Attending: Internal Medicine | Admitting: Internal Medicine

## 2020-09-24 ENCOUNTER — Other Ambulatory Visit: Payer: Self-pay

## 2020-09-24 VITALS — BP 123/89 | HR 63 | Temp 97.8°F | Resp 18 | Wt 133.2 lb

## 2020-09-24 DIAGNOSIS — R569 Unspecified convulsions: Secondary | ICD-10-CM

## 2020-09-24 DIAGNOSIS — R55 Syncope and collapse: Secondary | ICD-10-CM | POA: Diagnosis not present

## 2020-09-24 DIAGNOSIS — Z79899 Other long term (current) drug therapy: Secondary | ICD-10-CM | POA: Insufficient documentation

## 2020-09-24 DIAGNOSIS — C711 Malignant neoplasm of frontal lobe: Secondary | ICD-10-CM

## 2020-09-24 DIAGNOSIS — Z880 Allergy status to penicillin: Secondary | ICD-10-CM | POA: Diagnosis not present

## 2020-09-24 DIAGNOSIS — Z8249 Family history of ischemic heart disease and other diseases of the circulatory system: Secondary | ICD-10-CM | POA: Diagnosis not present

## 2020-09-24 DIAGNOSIS — R4781 Slurred speech: Secondary | ICD-10-CM | POA: Insufficient documentation

## 2020-09-24 DIAGNOSIS — Z808 Family history of malignant neoplasm of other organs or systems: Secondary | ICD-10-CM | POA: Diagnosis not present

## 2020-09-24 DIAGNOSIS — Z8043 Family history of malignant neoplasm of testis: Secondary | ICD-10-CM | POA: Diagnosis not present

## 2020-09-24 DIAGNOSIS — R200 Anesthesia of skin: Secondary | ICD-10-CM | POA: Insufficient documentation

## 2020-09-24 DIAGNOSIS — Q283 Other malformations of cerebral vessels: Secondary | ICD-10-CM | POA: Insufficient documentation

## 2020-09-24 NOTE — Progress Notes (Signed)
[facility] [place], [place] [address] [phone]   Interval Evaluation  Date of Service: 09/24/20 Patient Name: Semya Klinke Patient MRN: [id number] Patient DOB: Jan 20, 1981 Provider: Ventura Sellers, MD  Identifying Statement:  Deriyah Kunath is a 40 y.o. female with left temporal  grade 3 astrocytoma     Oncologic History: Oncology History  Astrocytoma of frontal lobe ([facility])  12/08/2019 Surgery   Stereotactic R temporal biopsy by Dr. Marcello Moores.  Path demonstrates Astrocytoma, IDH-mt, WHO grade 3   01/17/2020 - 03/02/2020 Radiation Therapy   IMRT and concurrent Temodar with Dr. Isidore Moos     Biomarkers:  MGMT Unknown.  IDH 1/2 Mutated.  EGFR Unknown  TERT Unknown   Interval History:  Dorsie Sethi present to clinic today after recent MRI brain.  She describes no change in right hand dysfunction after noted improvement following steroids.  Language function still varies from day to day, has difficulty with expression which is same as month prior.  Otherwise energy level is fine, denies seizures, headaches, dizzy episodes.  Stopped decadron two days before the MRI scan.  H+P (12/23/19) Patient presented to medical attention early in September with first ever seizure.  Event was described as "sudden onset left arm numbness" followed by "face twisting, and loss of consciousness for at least 30 minutes".  CNS imaging demonstrated a large left frontal non enhancing mass consistent with primary brain tumor.  She underwent biopsy on 12/08/19 with Dr. Marcello Moores.  Following surgery she has some slurring of speech but no other significant complaints, returned to prior baseline upon discharge.  Now home, she has had no breathrough events and no focal neurologic complaints.    Medications: Current Outpatient Medications on File Prior to Visit  Medication Sig Dispense Refill   dexamethasone (DECADRON) 4 MG tablet Take 1 tablet (4 mg  total) by mouth daily. 10 tablet 0   levETIRAcetam (KEPPRA) 500 MG tablet TAKE ONE TABLET BY MOUTH TWICE DAILY 60 tablet 0   MAGNESIUM PO Take 1 tablet by mouth daily.     NON FORMULARY Take 1 Dose by mouth See admin instructions. [place] Mushroom     NON FORMULARY Take 1 Dose by mouth See admin instructions. Frankincense     NON FORMULARY Take 1 Dose by mouth See admin instructions. Berberine     NON FORMULARY Take 1 Dose by mouth See admin instructions. Melantonin     NON FORMULARY Take 1 Dose by mouth See admin instructions. Resveratrol     NON FORMULARY Take 1 Dose by mouth See admin instructions. Green Tea Extract     NON FORMULARY Take 1 Dose by mouth See admin instructions. Milk Thistle     NON FORMULARY Take 1 Dose by mouth See admin instructions. Soy isoflavones     NON FORMULARY Take 1 Dose by mouth See admin instructions. lycopene     NON FORMULARY Take 5 capsules by mouth daily. Serapeptase enzyme supplement     ondansetron (ZOFRAN) 8 MG tablet Take 1 tablet (8 mg total) by mouth every 8 (eight) hours as needed for nausea or vomiting. Take 30-83mn before temodar (Patient not taking: No sig reported) 60 tablet 1   traZODone (DESYREL) 50 MG tablet Take 1 tablet (50 mg total) by mouth at bedtime. 30 tablet 3   TURMERIC PO Take 1 tablet by mouth daily.     UNABLE TO FIND Take 1 Dose by mouth daily. Med Name: Mushroom Tincture; for  immune support     zolpidem (AMBIEN) 5 MG tablet Take 1 tablet (5 mg total) by mouth at bedtime as needed for sleep. 30 tablet 0   No current facility-administered medications on file prior to visit.    Allergies:  Allergies  Allergen Reactions   Penicillins Rash and Other (See Comments)    Pt does not recall so well   Past Medical History:  Past Medical History:  Diagnosis Date   Headache    Past Surgical History:  Past Surgical History:  Procedure Laterality Date   ABDOMINAL SURGERY     APPLICATION OF CRANIAL NAVIGATION Left 12/08/2019    Procedure: APPLICATION OF CRANIAL NAVIGATION;  Surgeon: Vallarie Mare, MD;  Location: [facility];  Service: Neurosurgery;  Laterality: Left;  APPLICATION OF CRANIAL NAVIGATION   FRAMELESS  BIOPSY WITH BRAINLAB Left 12/08/2019   Procedure: Left stereotactic brain biopsy with brainlab;  Surgeon: Vallarie Mare, MD;  Location: [facility];  Service: Neurosurgery;  Laterality: Left;  Left stereotactic brain biopsy with brainlab   WISDOM TOOTH EXTRACTION     Social History:  Social History   Socioeconomic History   Marital status: Married    Spouse name: Not on file   Number of children: Not on file   Years of education: Not on file   Highest education level: Not on file  Occupational History   Not on file  Tobacco Use   Smoking status: Never   Smokeless tobacco: Never  Substance and Sexual Activity   Alcohol use: Not Currently   Drug use: Never   Sexual activity: Not on file  Other Topics Concern   Not on file  Social History Narrative   Not on file   Social Determinants of Health   Financial Resource Strain: Not on file  Food Insecurity: Not on file  Transportation Needs: Not on file  Physical Activity: Not on file  Stress: Not on file  Social Connections: Not on file  Intimate Partner Violence: Not on file   Family History:  Family History  Problem Relation Age of Onset   Migraines Mother    Thyroid cancer Father    Testicular cancer Father     Review of Systems: Constitutional: Doesn't report fevers, chills or abnormal weight loss Eyes: Doesn't report blurriness of vision Ears, nose, mouth, throat, and face: Doesn't report sore throat Respiratory: Doesn't report cough, dyspnea or wheezes Cardiovascular: Doesn't report palpitation, chest discomfort  Gastrointestinal:  Doesn't report nausea, constipation, diarrhea GU: Doesn't report incontinence Skin: Doesn't report skin rashes Neurological: Per HPI Musculoskeletal: Doesn't report joint pain Behavioral/Psych:  +anxiety  Physical Exam: Vitals:   09/24/20 1159  BP: 123/89  Pulse: 63  Resp: 18  Temp: 97.8 F (36.6 C)  SpO2: 100%   KPS: 80. General: Alert, cooperative, pleasant, in no acute distress Head: Normal EENT: No conjunctival injection or scleral icterus.  Lungs: Resp effort normal Cardiac: Regular rate Abdomen: Non-distended abdomen Skin: No rashes cyanosis or petechiae. Extremities: No clubbing or edema  Neurologic Exam: Mental Status: Awake, alert, attentive to examiner. Oriented to self and environment. Expressive dysphasia Cranial Nerves: Visual acuity is grossly normal. Visual fields are full. Extra-ocular movements intact. No ptosis. Face is symmetric Motor: Tone and bulk are normal. Fine motor impairment right hand only, no drift. Reflexes are symmetric, no pathologic reflexes present.  Sensory: Intact to light touch Gait: Normal.   Labs: I have reviewed the data as listed    Component Value Date/Time   NA  138 08/20/2020 1205   K 4.4 08/20/2020 1205   CL 106 08/20/2020 1205   CO2 25 08/20/2020 1205   GLUCOSE 103 (H) 08/20/2020 1205   BUN 14 08/20/2020 1205   CREATININE 0.99 08/20/2020 1205   CALCIUM 9.4 08/20/2020 1205   PROT 7.5 08/20/2020 1205   ALBUMIN 4.0 08/20/2020 1205   AST 14 (L) 08/20/2020 1205   ALT 16 08/20/2020 1205   ALKPHOS 61 08/20/2020 1205   BILITOT 0.6 08/20/2020 1205   GFRNONAA >60 08/20/2020 1205   GFRAA >60 12/09/2019 0623   Lab Results  Component Value Date   WBC 8.7 08/20/2020   NEUTROABS 6.9 08/20/2020   HGB 13.2 08/20/2020   HCT 35.9 (L) 08/20/2020   MCV 91.1 08/20/2020   PLT 219 08/20/2020   Imaging:  [facility] Clinician Interpretation: I have personally reviewed the CNS images as listed.  My interpretation, in the context of the patient's clinical presentation, is stable disease  MR BRAIN W WO CONTRAST  Result Date: 09/21/2020 CLINICAL DATA:  Grade 3 astrocytoma.  Assess treatment response EXAM: MRI HEAD WITHOUT AND WITH  CONTRAST TECHNIQUE: Multiplanar, multiecho pulse sequences of the brain and surrounding structures were obtained without and with intravenous contrast. CONTRAST:  54m GADAVIST GADOBUTROL 1 MMOL/ML IV SOLN COMPARISON:  MRI head 08/17/2020 FINDINGS: Brain: Large infiltrating mass lesion overall appears unchanged. There is nonenhancing infiltrating mass in the left medial temporal lobe and left anterior temporal lobe. There is infiltrating mass extending through the insula and into the left middle and posterior frontal lobe. Septated cyst in the left posterior temporal lobe is unchanged in size measuring approximately 27 x 24 by 31 mm. Surrounding the cyst, there is infiltrating T2 and FLAIR hyperintensity which is unchanged. There is also irregular patchy enhancement around the cyst especially medially and inferior to the cyst which appears stable from the prior study. There is mass-effect on the left lateral ventricle. 5 mm midline shift to the right is unchanged. No hydrocephalus. Negative for acute infarct. Developmental venous anomaly in the right frontal lobe with evidence of hemosiderin staining from prior hemorrhage unchanged. Vascular: Normal arterial flow voids. Skull and upper cervical spine: Biopsy tract on the left. Sinuses/Orbits: Negative Other: None IMPRESSION: Infiltrating mass in the left cerebral hemisphere involving the temporal and frontal lobe is stable. Tumor cyst is stable. Tumor enhancement is stable. Mass-effect with 5 mm midline shift to the right also stable. Electronically Signed   By: CFranchot GalloM.D.   On: 09/21/2020 14:23     Assessment/Plan Astrocytoma of frontal lobe ([facility] [C71.1]   JAbiha Lukehartis clinically and radiographically stable today.  MRI and clinical response to dexamethasone is encouraging, supportive of our hypothesis of radio-inflammatory process driving recent progressive changes.  Although we emphasize our recommendation for standard of care adjuvant  chemotherapy, we are agreeable with continued serial imaging monitoring if follow up is close and consistent.  We ask that JKarron Goensreturn to clinic in 2 months following next brain MRI, or sooner as needed.  Will continue Keppra [id number].    All questions were answered. The patient knows to call the clinic with any problems, questions or concerns. No barriers to learning were detected.  The total time spent in the encounter was 30 minutes and more than 50% was on counseling and review of test results   ZaVentura SellersMD Medical Director of Neuro-Oncology [facility] WeDawson6/27/22 11:12 AM

## 2020-09-26 ENCOUNTER — Other Ambulatory Visit: Payer: Self-pay | Admitting: Radiation Therapy

## 2020-09-27 ENCOUNTER — Other Ambulatory Visit: Payer: Self-pay | Admitting: Internal Medicine

## 2020-10-27 ENCOUNTER — Encounter: Payer: Self-pay | Admitting: Internal Medicine

## 2020-10-28 ENCOUNTER — Inpatient Hospital Stay (HOSPITAL_COMMUNITY): Payer: BC Managed Care – PPO

## 2020-10-28 ENCOUNTER — Emergency Department (HOSPITAL_COMMUNITY): Payer: BC Managed Care – PPO

## 2020-10-28 ENCOUNTER — Inpatient Hospital Stay (HOSPITAL_COMMUNITY)
Admission: EM | Admit: 2020-10-28 | Discharge: 2020-11-01 | DRG: 100 | Disposition: A | Discharge status: Home / Self Care | Payer: BC Managed Care – PPO | Attending: Internal Medicine | Admitting: Internal Medicine

## 2020-10-28 ENCOUNTER — Other Ambulatory Visit: Payer: Self-pay

## 2020-10-28 ENCOUNTER — Encounter (HOSPITAL_COMMUNITY): Payer: Self-pay | Admitting: Emergency Medicine

## 2020-10-28 DIAGNOSIS — Z808 Family history of malignant neoplasm of other organs or systems: Secondary | ICD-10-CM

## 2020-10-28 DIAGNOSIS — Z91041 Radiographic dye allergy status: Secondary | ICD-10-CM | POA: Diagnosis not present

## 2020-10-28 DIAGNOSIS — R471 Dysarthria and anarthria: Secondary | ICD-10-CM | POA: Diagnosis present

## 2020-10-28 DIAGNOSIS — Z923 Personal history of irradiation: Secondary | ICD-10-CM | POA: Diagnosis not present

## 2020-10-28 DIAGNOSIS — Z20822 Contact with and (suspected) exposure to covid-19: Secondary | ICD-10-CM | POA: Diagnosis present

## 2020-10-28 DIAGNOSIS — R1310 Dysphagia, unspecified: Secondary | ICD-10-CM | POA: Diagnosis present

## 2020-10-28 DIAGNOSIS — Z880 Allergy status to penicillin: Secondary | ICD-10-CM

## 2020-10-28 DIAGNOSIS — R569 Unspecified convulsions: Secondary | ICD-10-CM

## 2020-10-28 DIAGNOSIS — Z79899 Other long term (current) drug therapy: Secondary | ICD-10-CM

## 2020-10-28 DIAGNOSIS — Z9221 Personal history of antineoplastic chemotherapy: Secondary | ICD-10-CM

## 2020-10-28 DIAGNOSIS — C712 Malignant neoplasm of temporal lobe: Secondary | ICD-10-CM | POA: Diagnosis present

## 2020-10-28 DIAGNOSIS — R531 Weakness: Secondary | ICD-10-CM | POA: Diagnosis present

## 2020-10-28 DIAGNOSIS — G936 Cerebral edema: Secondary | ICD-10-CM | POA: Diagnosis present

## 2020-10-28 DIAGNOSIS — G4700 Insomnia, unspecified: Secondary | ICD-10-CM | POA: Diagnosis present

## 2020-10-28 DIAGNOSIS — C719 Malignant neoplasm of brain, unspecified: Secondary | ICD-10-CM | POA: Diagnosis not present

## 2020-10-28 DIAGNOSIS — G40901 Epilepsy, unspecified, not intractable, with status epilepticus: Secondary | ICD-10-CM | POA: Diagnosis present

## 2020-10-28 DIAGNOSIS — G8384 Todd's paralysis (postepileptic): Secondary | ICD-10-CM | POA: Diagnosis present

## 2020-10-28 DIAGNOSIS — R29810 Facial weakness: Secondary | ICD-10-CM | POA: Diagnosis present

## 2020-10-28 DIAGNOSIS — D330 Benign neoplasm of brain, supratentorial: Secondary | ICD-10-CM | POA: Diagnosis not present

## 2020-10-28 HISTORY — DX: Neoplasm of unspecified behavior of brain: D49.6

## 2020-10-28 LAB — CBC WITH DIFFERENTIAL/PLATELET
Abs Immature Granulocytes: 0.02 10*3/uL (ref 0.00–0.07)
Basophils Absolute: 0 10*3/uL (ref 0.0–0.1)
Basophils Relative: 0 %
Eosinophils Absolute: 0 10*3/uL (ref 0.0–0.5)
Eosinophils Relative: 0 %
HCT: 39.1 % (ref 36.0–46.0)
Hemoglobin: 14.2 g/dL (ref 12.0–15.0)
Immature Granulocytes: 0 %
Lymphocytes Relative: 13 %
Lymphs Abs: 0.9 10*3/uL (ref 0.7–4.0)
MCH: 33.3 pg (ref 26.0–34.0)
MCHC: 36.3 g/dL — ABNORMAL HIGH (ref 30.0–36.0)
MCV: 91.6 fL (ref 80.0–100.0)
Monocytes Absolute: 0.5 10*3/uL (ref 0.1–1.0)
Monocytes Relative: 8 %
Neutro Abs: 5.3 10*3/uL (ref 1.7–7.7)
Neutrophils Relative %: 79 %
Platelets: 215 10*3/uL (ref 150–400)
RBC: 4.27 MIL/uL (ref 3.87–5.11)
RDW: 10.9 % — ABNORMAL LOW (ref 11.5–15.5)
WBC: 6.8 10*3/uL (ref 4.0–10.5)
nRBC: 0 % (ref 0.0–0.2)

## 2020-10-28 LAB — BASIC METABOLIC PANEL
Anion gap: 7 (ref 5–15)
BUN: 11 mg/dL (ref 6–20)
CO2: 23 mmol/L (ref 22–32)
Calcium: 9.2 mg/dL (ref 8.9–10.3)
Chloride: 106 mmol/L (ref 98–111)
Creatinine, Ser: 0.95 mg/dL (ref 0.44–1.00)
GFR, Estimated: >60 mL/min (ref >60)
Glucose, Bld: 118 mg/dL — ABNORMAL HIGH (ref 70–99)
Potassium: 3.7 mmol/L (ref 3.5–5.1)
Sodium: 136 mmol/L (ref 135–145)

## 2020-10-28 LAB — HEPATIC FUNCTION PANEL
ALT: 14 U/L (ref 0–44)
AST: 15 U/L (ref 15–41)
Albumin: 3.8 g/dL (ref 3.5–5.0)
Alkaline Phosphatase: 41 U/L (ref 38–126)
Bilirubin, Direct: 0.1 mg/dL (ref 0.0–0.2)
Indirect Bilirubin: 0.6 mg/dL (ref 0.3–0.9)
Total Bilirubin: 0.7 mg/dL (ref 0.3–1.2)
Total Protein: 6.7 g/dL (ref 6.5–8.1)

## 2020-10-28 LAB — I-STAT BETA HCG BLOOD, ED (MC, WL, AP ONLY): I-stat hCG, quantitative: <5 m[IU]/mL (ref <5)

## 2020-10-28 IMAGING — MR MR HEAD W/O CM
6 of 12 series · 23 of 48 positions shown · non-contrast
Comparison: Prior head CT examinations [DATE] and earlier.
Brain MRI [DATE].

CLINICAL DATA: Seizure, abnormal neuro exam.

EXAM:
MRI HEAD WITHOUT CONTRAST
TECHNIQUE: Multiplanar, multiecho pulse sequences of the brain and surrounding
structures were obtained without intravenous contrast.

[Series 3: DWI · axial · 3.0mm · 0.94mm/px · z∈[-45,+87]mm · 7 of 100 slices shown (1 of 2)]
[im 1/100]
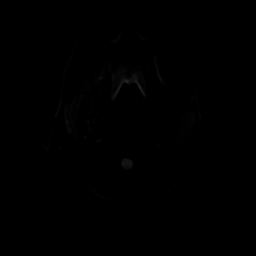
[im 17/100]
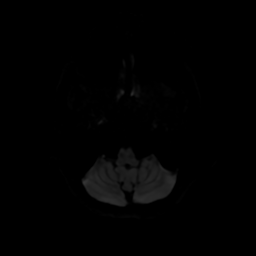
[im 34/100]
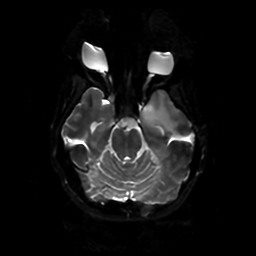
[im 50/100]
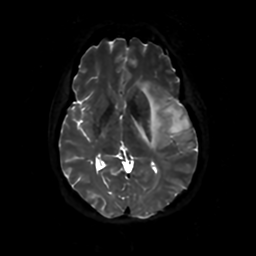
[im 67/100]
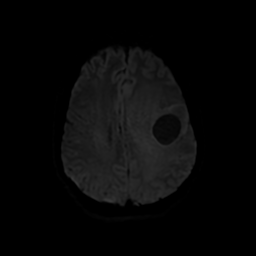
[im 83/100]
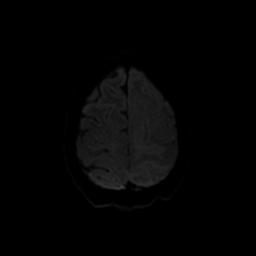
[im 100/100]
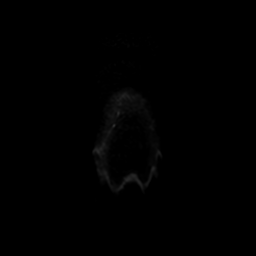

[Series 4: DWI · coronal · 4.0mm · 0.94mm/px · 6 of 74 slices shown (2 of 2)]
[im 1/74]
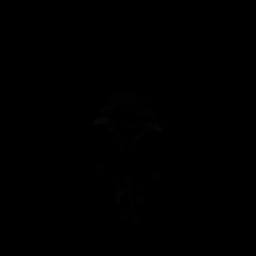
[im 15/74]
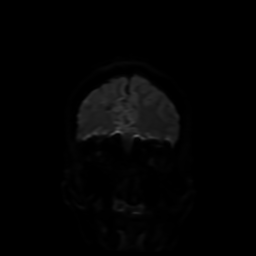
[im 30/74]
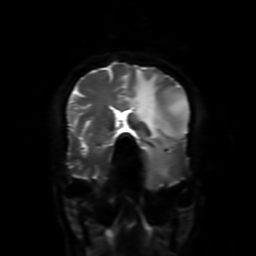
[im 44/74]
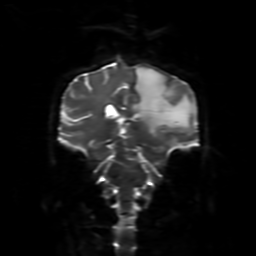
[im 59/74]
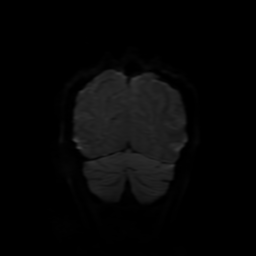
[im 74/74]
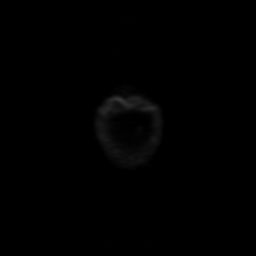

[Series 5: FLAIR · sagittal · 5.0mm · 0.23mm/px · 2 of 23 slices shown (1 of 3)]
[im 1/23]
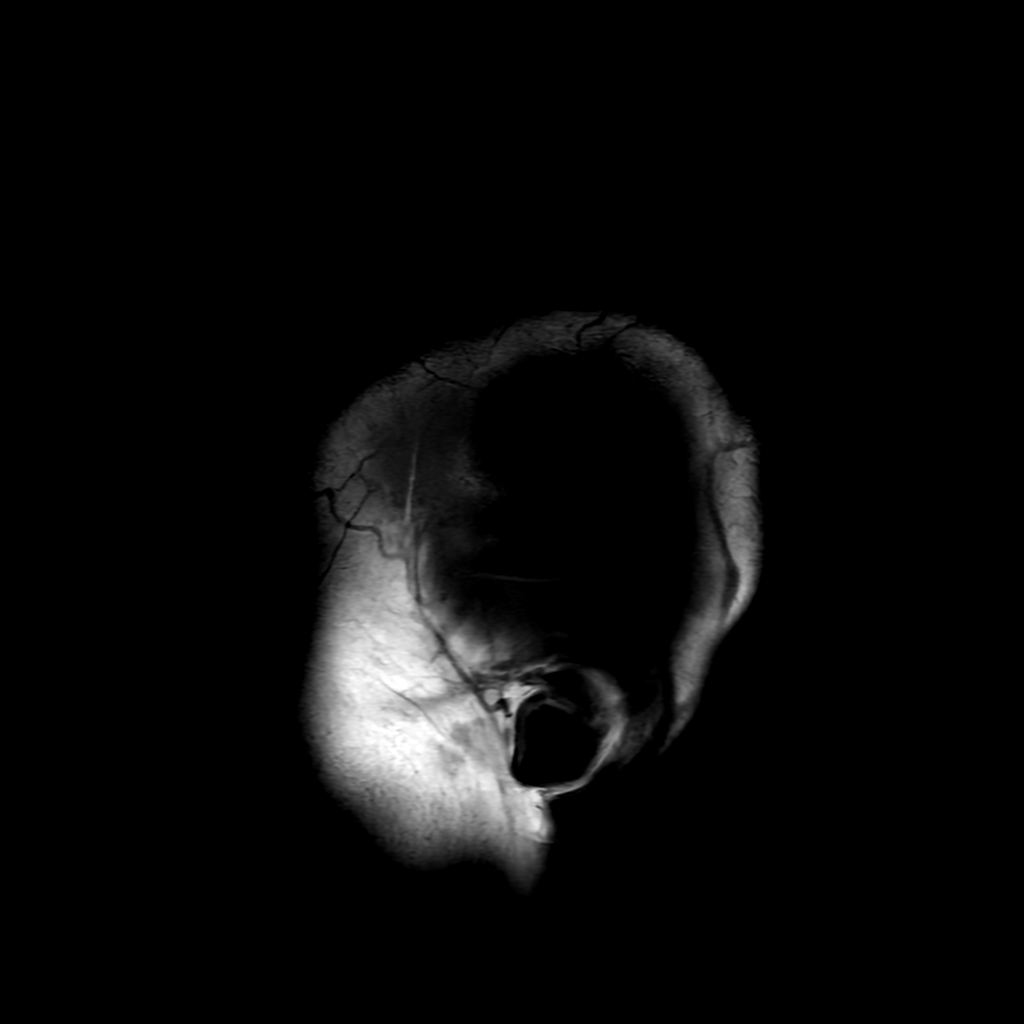
[im 23/23]
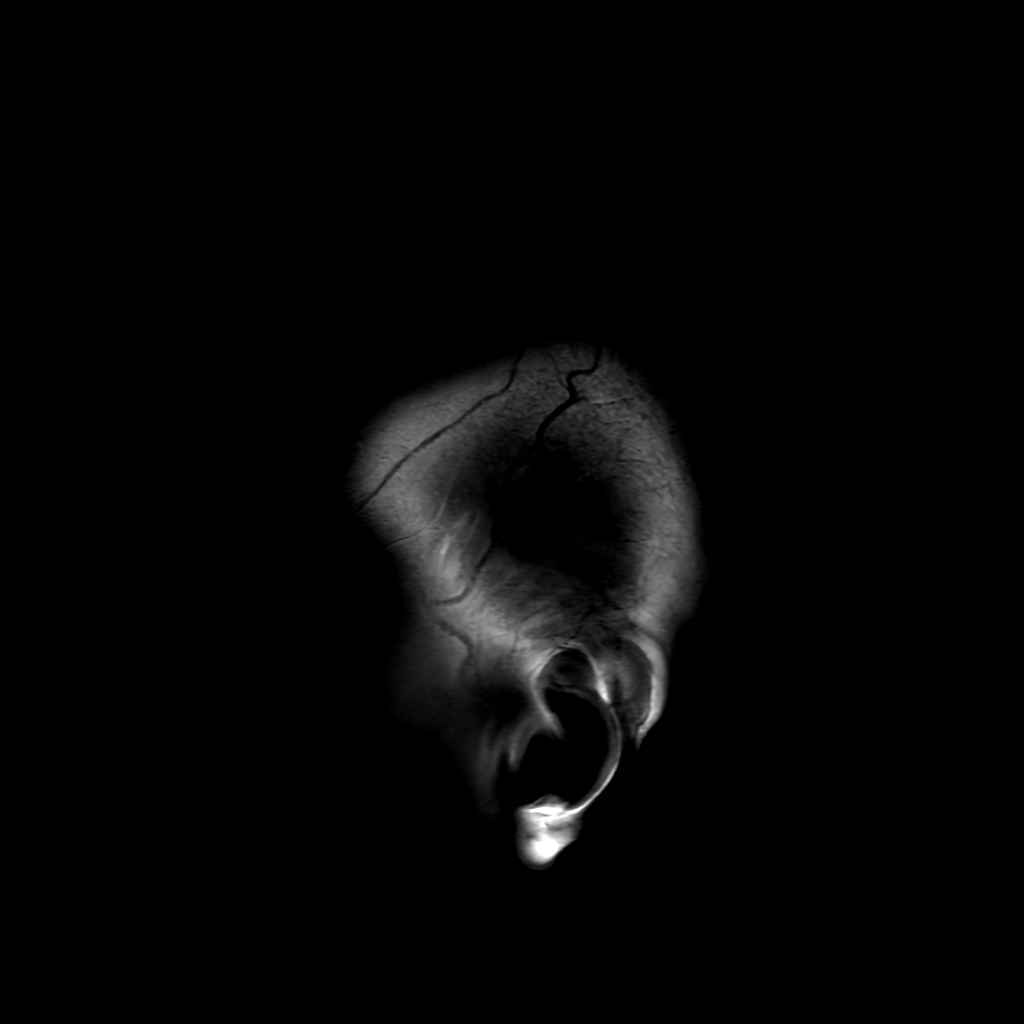

[Series 7: FLAIR · axial · 4.0mm · 0.45mm/px · z∈[-43,+88]mm · 3 of 34 slices shown (2 of 3)]
[im 1/34]
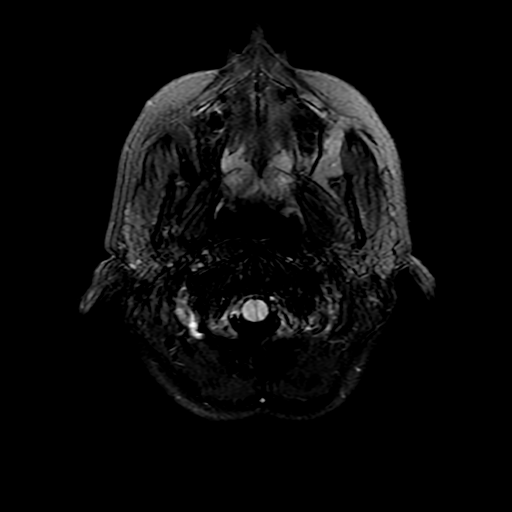
[im 17/34]
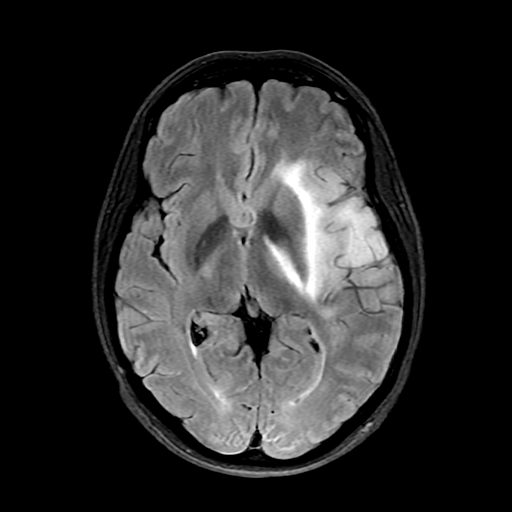
[im 34/34]
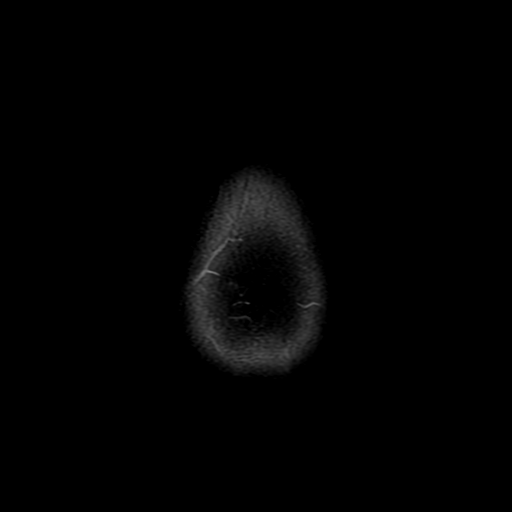

[Series 11: FLAIR · oblique · 3.0mm · 0.39mm/px · 3 of 33 slices shown (3 of 3)]
[im 1/33]
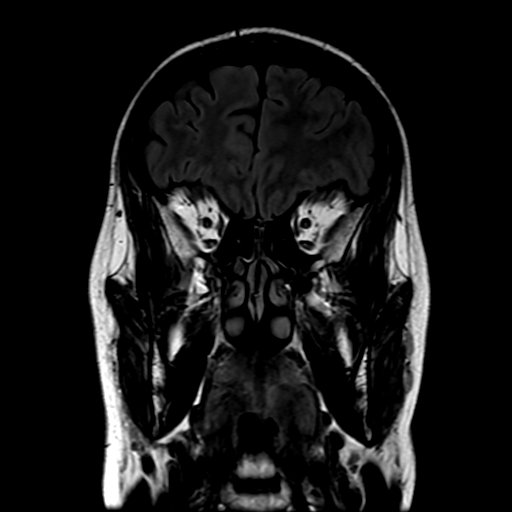
[im 17/33]
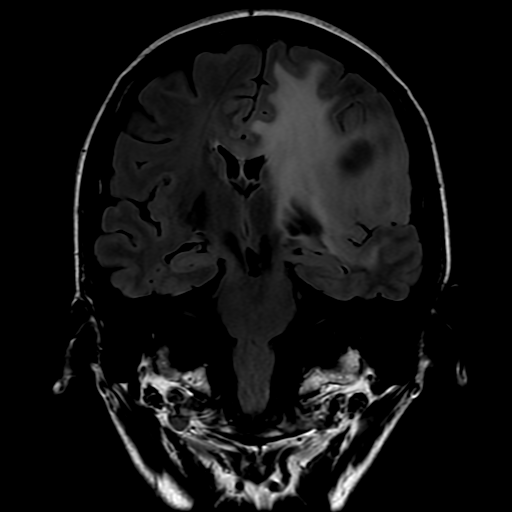
[im 33/33]
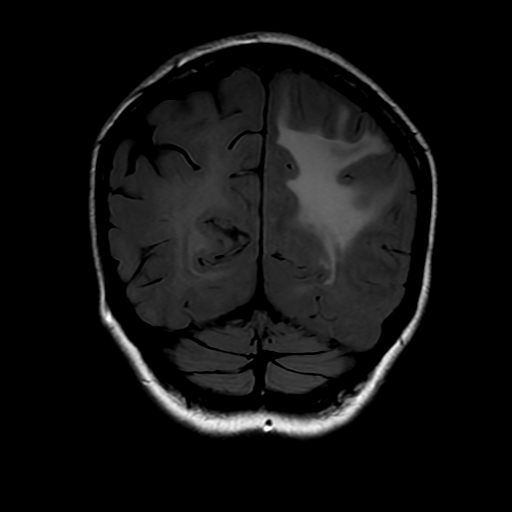

[Series 350: ADC · axial · 3.0mm · 0.94mm/px · z∈[-45,-2]mm · 2 of 50 slices shown]
[im 1/50]
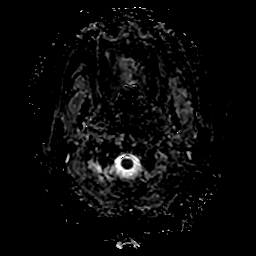
[im 17/50]
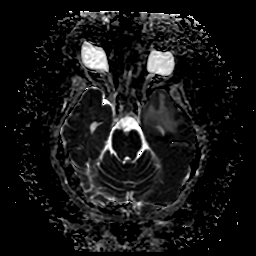

[23 of 48 positions shown; findings below may reference images not displayed]

FINDINGS: Brain:

Redemonstrated large infiltrating mass with associated edema in the
left frontal and parietal lobes, left insula and subinsular white
matter, posterior limb of left internal capsule and left temporal
lobe. T2/FLAIR hyperintense signal abnormality has increased in
extent as compared to the prior brain MRI of [DATE], and this
may reflect an interval increase in associated edema or interval
progression of infiltrating neoplasm. Unchanged size of an
associated tumor cyst within the posterior left frontal lobe
measuring up to 3.5 cm in greatest dimension. Small foci of
calcification along the periphery of the tumor cyst were better
appreciated on recent prior head CTs. Mass effect with partial
effacement of the left lateral ventricle and 6 mm rightward midline
shift, unchanged as compared to the head CT performed earlier today,
but slightly increased as compared to the brain MRI of [DATE]
(midline shift measured 5 mm on the prior MRI). Increased from the
prior MRI, there is partial effacement of the basal cisterns on the
left, and the medial left temporal lobe exerts mild mass effect upon
the left cerebral peduncle.

No evidence of hydrocephalus or ventricular entrapment. No
extra-axial fluid collection.

Vascular: Expected proximal arterial flow voids. Redemonstrated
right frontal lobe developmental venous anomaly.

Skull and upper cervical spine: No focal marrow lesion.

Sinuses/Orbits: Visualized orbits show no acute finding. No
significant paranasal sinus disease.
IMPRESSION: Redemonstrated large infiltrating mass with associated edema in the
left frontal and parietal lobes, left insula/subinsular white
matter, posterior limb of left internal capsule and left temporal
lobe. T2/FLAIR hyperintense signal abnormality has increased in
extent as compared to the prior brain MRI of [DATE], and this
may reflect an interval increase in associated edema or interval
progression of infiltrating neoplasm. Unchanged size of an
associated tumor cyst within the posterior left frontal lobe, again
measuring up to 3.5 cm in greatest dimension.

Mass effect with partial effacement of the left lateral ventricle
and 6 mm rightward midline shift, unchanged as compared to the head
CT performed earlier today, but slightly increased as compared to
the brain MRI of [DATE] (midline shift measured 5 mm on the
prior MRI). Also increased from the prior MRI, there is partial
effacement of the basal cisterns on the left, with the medial left
temporal lobe exerting mild mass effect upon the left cerebral
peduncle.

No evidence of hydrocephalus or ventricular entrapment at this time.

## 2020-10-28 IMAGING — CT CT HEAD W/O CM
4 series · 16 of 47 positions shown, 18 images · non-contrast
Comparison: Head CT [DATE].  Brain MRI [DATE].

CLINICAL DATA: Seizure, abnormal neuro exam. Additional provided:
Right-sided weakness, history of tumor.

EXAM:
CT HEAD WITHOUT CONTRAST
TECHNIQUE: Contiguous axial images were obtained from the base of the skull
through the vertex without intravenous contrast.

[Series 3: head wo · axial · 0.43mm/px · z∈[+1215,+1345]mm · 7 of 36 slices shown, 9 images]
[im 5/36  brain]
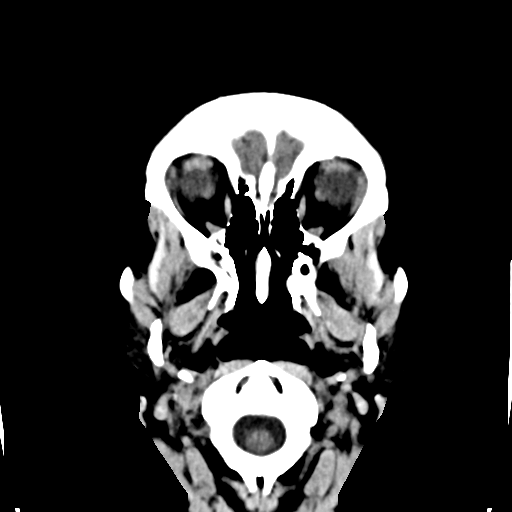
[im 5/36  bone]
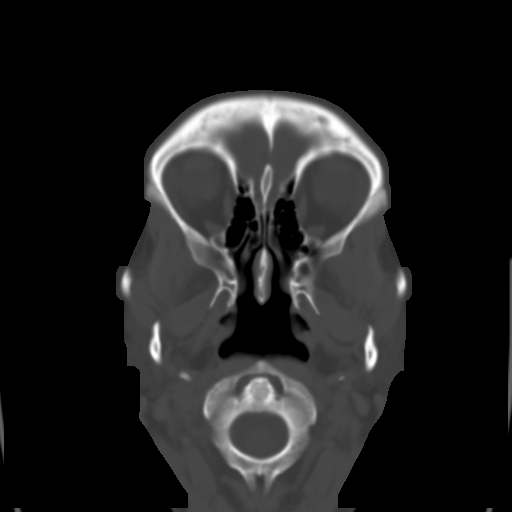
[im 9/36  brain]
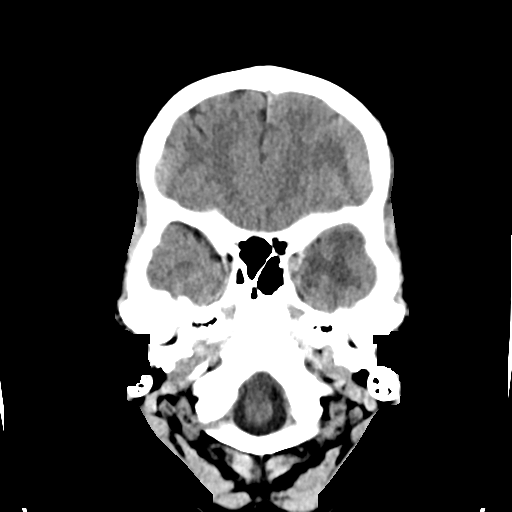
[im 14/36  brain]
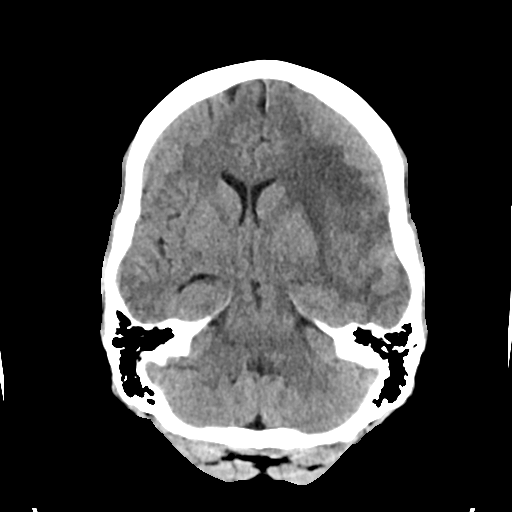
[im 18/36  brain]
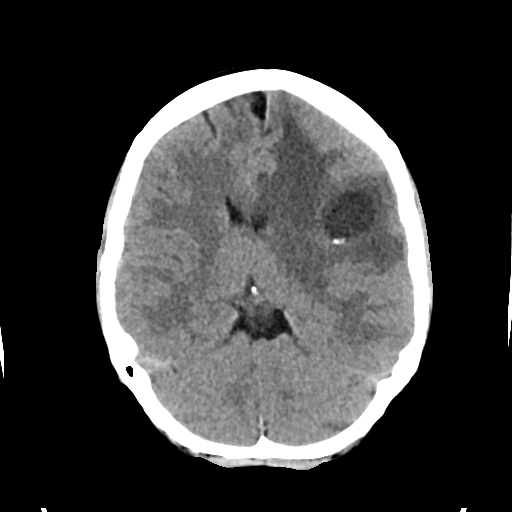
[im 22/36  brain]
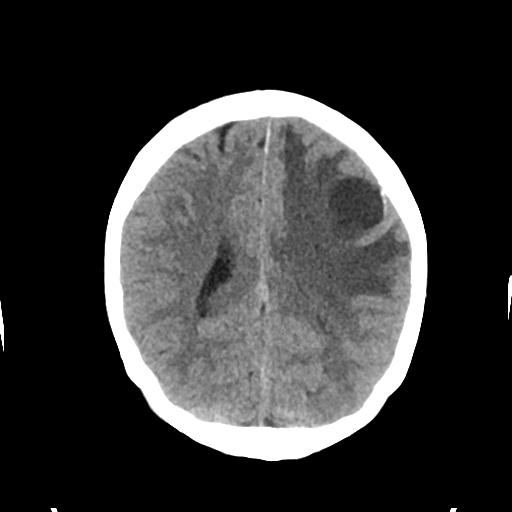
[im 22/36  bone]
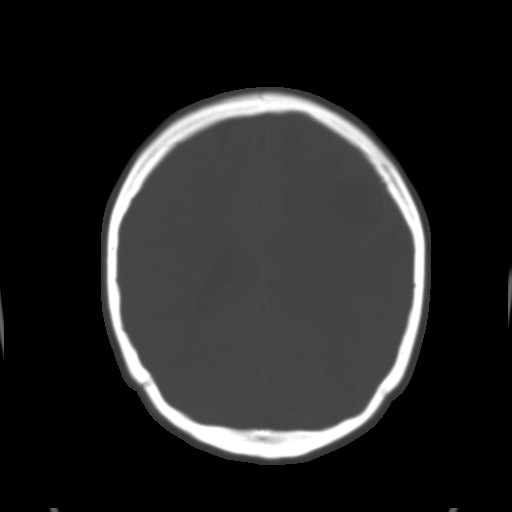
[im 27/36  brain]
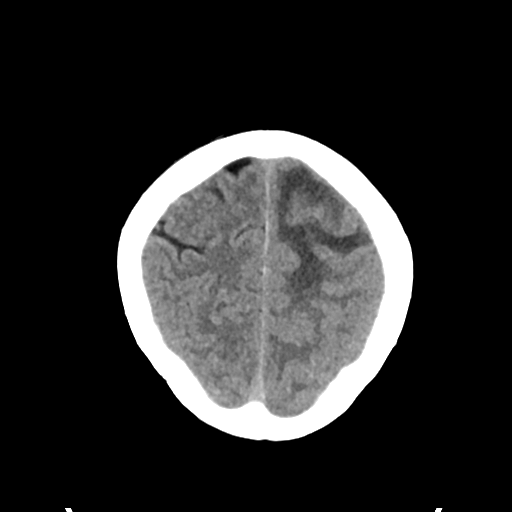
[im 31/36  brain]
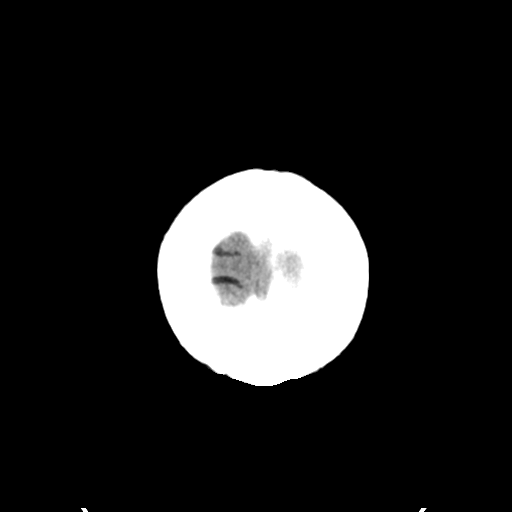

[Series 4: head bone · axial · 0.43mm/px · z∈[+1211,+1247]mm · 3 of 89 slices shown]
[im 9/89  bone]
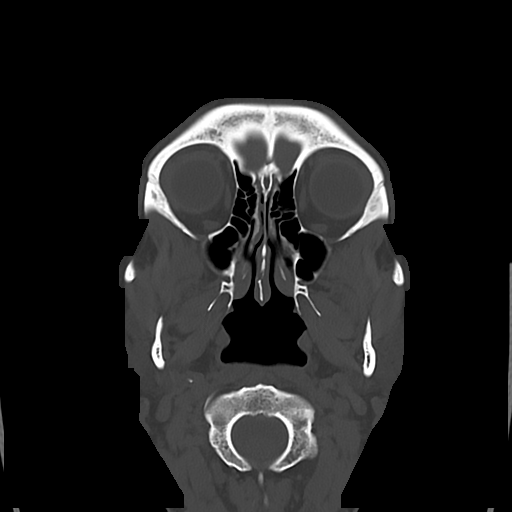
[im 18/89  bone]
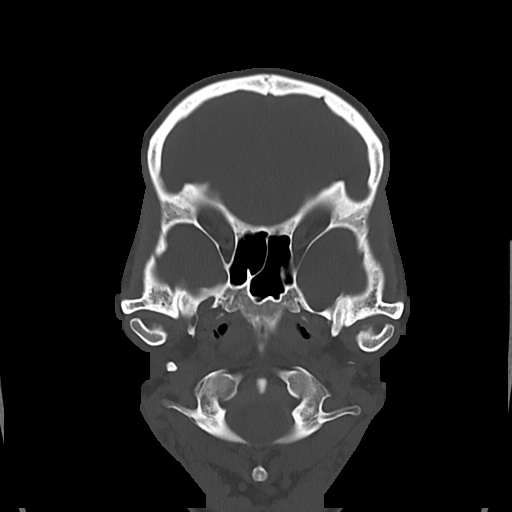
[im 27/89  bone]
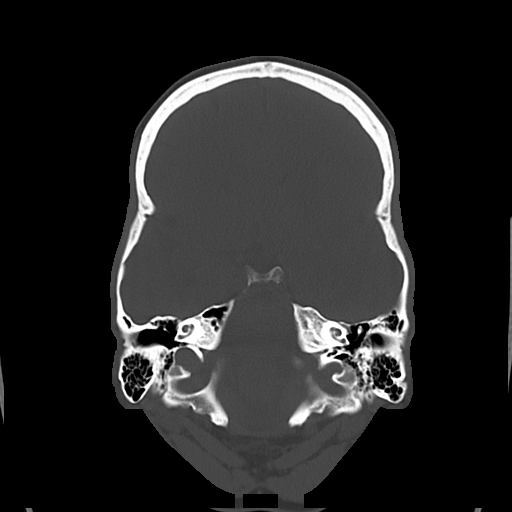

[Series 5: cor soft · coronal · 0.33mm/px · 3 of 67 slices shown]
[im 25/67  brain]
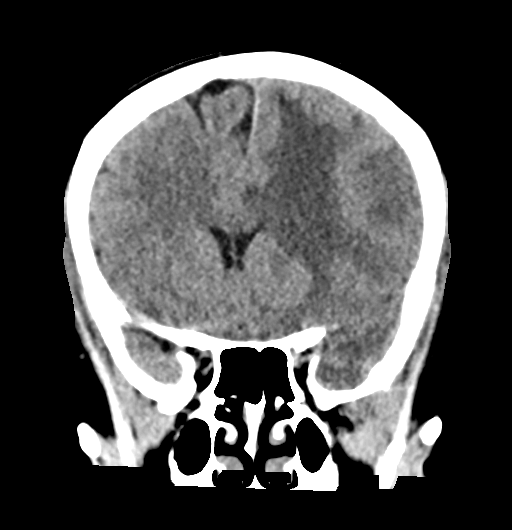
[im 31/67  brain]
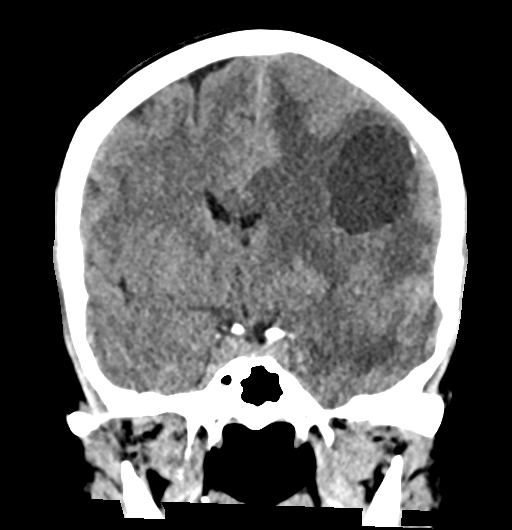
[im 36/67  brain]
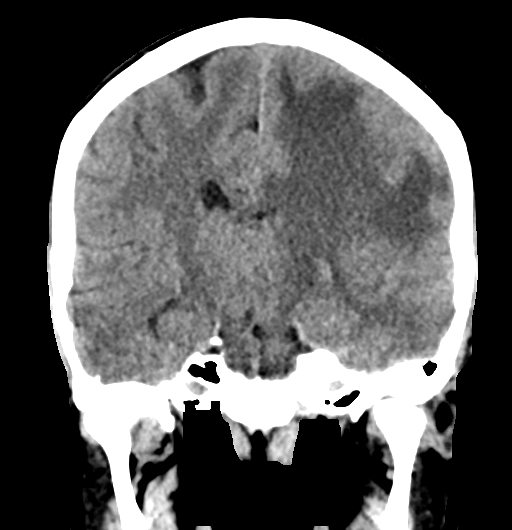

[Series 6: sag soft · sagittal · 0.41mm/px · 3 of 57 slices shown]
[im 19/57  brain]
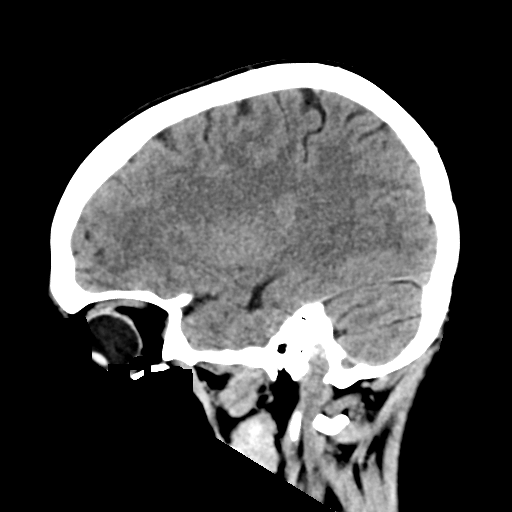
[im 29/57  brain]
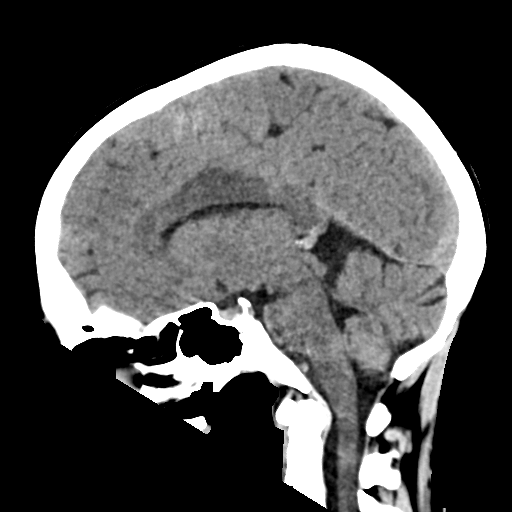
[im 38/57  brain]
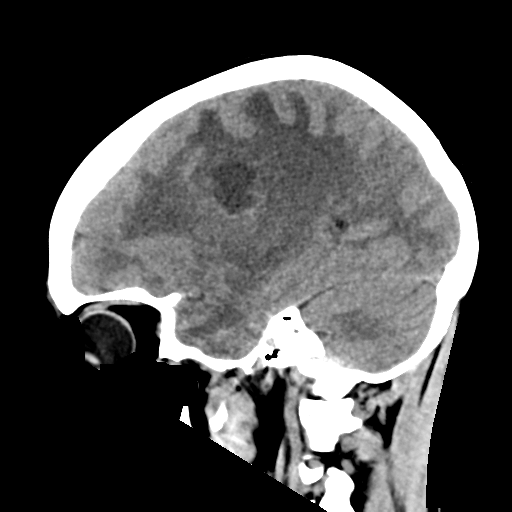

[16 of 47 positions shown; findings below may reference images not displayed]

FINDINGS: Brain:

Cerebral volume is normal.

Redemonstrated large infiltrative mass with associated edema
involving the left frontal lobe, left parietal lobe, left insula,
left subinsular region, posterior limb of left internal capsule and
anteromedial left temporal lobe. Redemonstrated tumor cyst within
the left frontal lobe, measuring up to 3.5 cm in greatest dimension,
not significantly changed from the head CT of [DATE]. As before,
there are small nodular foci of calcification along the periphery of
the tumor cyst. Mass effect appears stable as compared to the head
CT of [DATE] with partial effacement of the left lateral
ventricle and 6 mm rightward midline shift measured at the level of
the septum pellucidum.

No evidence of acute intracranial hemorrhage or acute demarcated
cortical infarction. No extra-axial fluid collection.

Vascular: No hyperdense vessel.

Skull: Normal. Negative for fracture or focal lesion.

Sinuses/Orbits: Visualized orbits show no acute finding. No
significant paranasal sinus disease at the imaged levels.
IMPRESSION: No significant interval change as compared to the head CT of
[DATE].

Redemonstrated large infiltrative mass with associated edema within
the left frontal lobe, parietal lobe, insula/subinsular region,
posterior limb of left internal capsule and anteromedial temporal
lobe. Stable size of an associated tumor cyst again measuring up to
3.5 cm in greatest dimension.

Unchanged mass effect with partial effacement of the left lateral
ventricle and 6 mm rightward midline shift.

No evidence of acute intracranial hemorrhage or acute infarction.

## 2020-10-28 MED ORDER — METHYLPREDNISOLONE SODIUM SUCC 125 MG IJ SOLR
125.0000 mg | Freq: Once | INTRAMUSCULAR | Status: AC
  Administered 2020-10-28: 125 mg via INTRAVENOUS
  Filled 2020-10-28: qty 2

## 2020-10-28 MED ORDER — ENOXAPARIN SODIUM 40 MG/0.4ML IJ SOSY
40.0000 mg | PREFILLED_SYRINGE | INTRAMUSCULAR | Status: DC
  Administered 2020-10-28 – 2020-10-31 (×4): 40 mg via SUBCUTANEOUS
  Filled 2020-10-28 (×4): qty 0.4

## 2020-10-28 MED ORDER — DIPHENHYDRAMINE HCL 50 MG/ML IJ SOLN
25.0000 mg | Freq: Once | INTRAMUSCULAR | Status: AC
  Administered 2020-10-28: 25 mg via INTRAVENOUS
  Filled 2020-10-28: qty 1

## 2020-10-28 MED ORDER — LORAZEPAM 2 MG/ML IJ SOLN
INTRAMUSCULAR | Status: AC
  Administered 2020-10-28: 2 mg via INTRAVENOUS
  Filled 2020-10-28: qty 1

## 2020-10-28 MED ORDER — SODIUM CHLORIDE 0.9 % IV SOLN: INTRAVENOUS | Status: DC

## 2020-10-28 MED ORDER — CHLORHEXIDINE GLUCONATE CLOTH 2 % EX PADS
6.0000 | MEDICATED_PAD | Freq: Every day | CUTANEOUS | Status: DC
Start: 2020-10-29 — End: 2020-11-01
  Administered 2020-10-30: 6 via TOPICAL

## 2020-10-28 MED ORDER — DEXAMETHASONE 4 MG PO TABS: 4.0000 mg | ORAL_TABLET | Freq: Every day | ORAL | Status: DC

## 2020-10-28 MED ORDER — LEVETIRACETAM IN NACL 1500 MG/100ML IV SOLN
1500.0000 mg | Freq: Once | INTRAVENOUS | Status: AC
  Administered 2020-10-28: 1500 mg via INTRAVENOUS
  Filled 2020-10-28: qty 100

## 2020-10-28 MED ORDER — MUPIROCIN 2 % EX OINT: 1.0000 "application " | TOPICAL_OINTMENT | Freq: Two times a day (BID) | CUTANEOUS | Status: DC

## 2020-10-28 MED ORDER — DOCUSATE SODIUM 100 MG PO CAPS: 100.0000 mg | ORAL_CAPSULE | Freq: Two times a day (BID) | ORAL | Status: DC | PRN

## 2020-10-28 MED ORDER — LEVETIRACETAM IN NACL 1000 MG/100ML IV SOLN
1000.0000 mg | Freq: Two times a day (BID) | INTRAVENOUS | Status: DC
  Administered 2020-10-29 (×2): 1000 mg via INTRAVENOUS
  Filled 2020-10-28 (×2): qty 100

## 2020-10-28 MED ORDER — POLYETHYLENE GLYCOL 3350 17 G PO PACK: 17.0000 g | PACK | Freq: Every day | ORAL | Status: DC | PRN

## 2020-10-28 NOTE — Consult Note (Signed)
NEURO HOSPITALIST CONSULT NOTE   Requestig physician: Dr. Billy Fischer  Reason for Consult: Recurrence of seizure activity  History obtained from:  Patient, Family and Chart     HPI:                                                                                                                                          Candace [name] is an 40 y.o. 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 She has been on Keppra for the past 10 months for focal motor seizures secondary to the astrocytoma. She was first diagnosed with the astrocytoma after having her first seizure, which consisted of right facial twitching with associated right arm and leg weakness. She was started on Keppra at the time of her first seizure with good control since then, up until last Friday. She underwent radiation and chemotherapy after the tumor was biopsied and had been on Dexamethasone for several months; Dexamethasone was recently stopped. At her recent baseline, she has been able to   Last Friday, she had sudden onset of right facial twitching with associated RUE > RLE weakness. She was seen at an outside ED [facility]) and discharged home with family planning to take her to Dr. Mickeal Skinner after the weekend. However, she had recurrence of several episodes of right facial twitching on Saturday, lasting about 10 seconds each, with associated worsened dysarthria and RUE > RLE weakness. Overnight, per husband, she seemed to be twitching - he asked her if she was ok and she said yes, so he decided to hold off on evaluation. This AM she had two more spells at home, so her husband decided to bring her back in to the ED. In the ED she had another spell of twitching for about 10 seconds. Subsequently, she had an approximately 2 minute long episode of right facial twitching. Ativan 2 mg IV was  administered and she has not had further twitching spells, but is now mildly sedated.    Her recent baseline has been fully functioning, able to use her RUE essentially normally and ambulate without difficulty. She also has been driving on her own. Her home anticonvulsant regimen consists of Keppra 500 mg BID, with which she has been compliant.   Past Medical History:  Diagnosis Date   Brain tumor [facility])    Headache     Past Surgical History:  Procedure Laterality Date   ABDOMINAL SURGERY     APPLICATION OF CRANIAL NAVIGATION Left 12/08/2019   Procedure: APPLICATION OF CRANIAL NAVIGATION;  Surgeon: Vallarie Mare, MD;  Location: [place];  Service: Neurosurgery;  Laterality: Left;  APPLICATION OF CRANIAL NAVIGATION   FRAMELESS  BIOPSY WITH BRAINLAB Left  12/08/2019   Procedure: Left stereotactic brain biopsy with brainlab;  Surgeon: Vallarie Mare, MD;  Location: [place];  Service: Neurosurgery;  Laterality: Left;  Left stereotactic brain biopsy with brainlab   WISDOM TOOTH EXTRACTION      Family History  Problem Relation Age of Onset   Migraines Mother    Thyroid cancer Father    Testicular cancer Father               Social History:  reports that she has never smoked. She has never used smokeless tobacco. She reports previous alcohol use. She reports that she does not use drugs.  Allergies  Allergen Reactions   Penicillins Rash and Other (See Comments)    Pt does not recall so well    MEDICATIONS:                                                                                                                     No current facility-administered medications on file prior to encounter.   Current Outpatient Medications on File Prior to Encounter  Medication Sig Dispense Refill   levETIRAcetam (KEPPRA) 500 MG tablet TAKE ONE TABLET BY MOUTH TWICE DAILY 60 tablet 0   MAGNESIUM PO Take 1 tablet by mouth daily.     NON FORMULARY Take 1 Dose by mouth See admin instructions. [place]  Mushroom     NON FORMULARY Take 1 Dose by mouth See admin instructions. Frankincense     NON FORMULARY Take 1 Dose by mouth See admin instructions. Berberine     NON FORMULARY Take 1 Dose by mouth See admin instructions. Melantonin     NON FORMULARY Take 1 Dose by mouth See admin instructions. Resveratrol     NON FORMULARY Take 1 Dose by mouth See admin instructions. Green Tea Extract     NON FORMULARY Take 1 Dose by mouth See admin instructions. Milk Thistle     NON FORMULARY Take 1 Dose by mouth See admin instructions. Soy isoflavones     NON FORMULARY Take 1 Dose by mouth See admin instructions. lycopene     NON FORMULARY Take 5 capsules by mouth daily. Serapeptase enzyme supplement     traZODone (DESYREL) 50 MG tablet Take 1 tablet (50 mg total) by mouth at bedtime. 30 tablet 3   TURMERIC PO Take 1 tablet by mouth daily.     UNABLE TO FIND Take 1 Dose by mouth daily. Med Name: Mushroom Tincture; for immune support      ROS:  Has a mild 3/10 left sided headache. No vision loss, CP, SOB, infectious symptoms or left sided deficits. Other ROS as per HPI without any additional symptoms.   Blood pressure 118/77, pulse 84, temperature 98.6 F (37 C), temperature source Oral, resp. rate 17, SpO2 100 %.  General Examination:                                                                                                       Physical Exam  HEENT-  Small healed scar to left side of scalp.    Lungs- Respirations unlabored Extremities- No edema  Neurological Examination Mental Status: Exam performed shortly after she received 2 mg IV Ativan. Sedated affect with decreased level of alertness. Able to follow all commands. Hypophonic, dysarthric speech is sparse but without evidence for aphasia.  Cranial Nerves: II: Temporal visual fields intact without extinction to  DSS. PERRL.   III,IV, VI: No ptosis. EOMI without nystagmus. No gaze deviation.  V: Temp sensation intact bilaterally.  VII: Mild right facial droop. No facial twitching seen.  VIII: Hearing intact to voice IX,X: Hypophonic speech XI: Right shoulder with slightly less tone than left.  Motor: RUE 1/5 proximally and distally. No jerking or twitching seen. Flaccid tone.  RLE 4-/5. No jerking or twitching seen.  LUE and LLE 5/5. Sensory: Temp and light touch intact in BUE and BLE.  Deep Tendon Reflexes: Slightly brisker on the right.  Plantars: Right: downgoing   Left: downgoing Cerebellar: No ataxia with FNF on the left. Unable to perform on the right.  Gait: Unable to assess.    Lab Results: Basic Metabolic Panel: Recent Labs  Lab 10/28/20 1116  NA 136  K 3.7  CL 106  CO2 23  GLUCOSE 118*  BUN 11  CREATININE 0.95  CALCIUM 9.2    CBC: Recent Labs  Lab 10/28/20 1116  WBC 6.8  NEUTROABS 5.3  HGB 14.2  HCT 39.1  MCV 91.6  PLT 215    Cardiac Enzymes: No results for input(s): CKTOTAL, CKMB, CKMBINDEX, TROPONINI in the last 168 hours.  Lipid Panel: No results for input(s): CHOL, TRIG, HDL, CHOLHDL, VLDL, LDLCALC in the last 168 hours.  Imaging:  MRI w/o contrast 06/08/20: Interval progression of left cerebral glioma.  MRI w and w/o contrast 09/21/20: Infiltrating mass in the left cerebral hemisphere involving the temporal and frontal lobe is stable. Tumor cyst is stable. Tumor enhancement is stable. Mass-effect with 5 mm midline shift to the right also stable.  MRI w/o contrast obtained today (10/28/20): - Redemonstrated large infiltrating mass with associated edema in the left frontal and parietal lobes, left insula/subinsular white matter, posterior limb of left internal capsule and left temporal lobe. T2/FLAIR hyperintense signal abnormality has increased in extent as compared to the prior brain MRI of 09/21/2020, and this may reflect an interval increase in  associated edema or interval progression of infiltrating neoplasm. Unchanged size of an associated tumor cyst within the posterior left frontal lobe, again measuring up to 3.5 cm in greatest dimension. - Mass effect with partial effacement of  the left lateral ventricle and 6 mm rightward midline shift, unchanged as compared to the head CT performed earlier today, but slightly increased as compared to the brain MRI of 09/21/2020 (midline shift measured 5 mm on the prior MRI). Also increased from the prior MRI, there is partial effacement of the basal cisterns on the left, with the medial left temporal lobe exerting mild mass effect upon the left cerebral peduncle. - No evidence of hydrocephalus or ventricular entrapment at this time.  Assessment: 40 year old female with known left temporofrontal grade III astrocytoma s/p chemotherapy and radiotherapy. Presents with recurrence of seizure activity.  1. Exam reveals sedated affect, right facial droop, hypophonic and dysarthric speech, slowed mentation, and RUE > RLE weakness 2. CT head: Cystic left frontal lobe lesion with extensive surrounding hypointense signal in the left frontal and temporal lobes, associated with mass effect, partial effacement of the right lateral ventricle and mild left to right midline shift.   3. MRI brain from today: As documented above.   Recommendations: 1. Loaded with 1500 mg IV Keppra. Seizure activity in the ED aborted with 2 mg IV Ativan.  2. Increasing home dose of Keppra to 1000 mg BID. Will administer IV for now.  3. Will potentially need to be restarted on chemotherapy. Therefore, if a second AED is needed, most likely will use Vim[facility].  4. STAT LTM EEG 5. MRI brain. Patient is refusing contrast. Of note, she has a contrast allergy for which premedication has been given. 6. Inpatient seizure precautions.  7. Outpatient seizure precautions: Per [facility] statutes, patients with seizures are not  allowed to drive until  they have been seizure-free for six months. Use caution when using heavy equipment or power tools. Avoid working on ladders or at heights. Take showers instead of baths. Ensure the water temperature is not too high on the home water heater. Do not go swimming alone. When caring for infants or small children, sit down when holding, feeding, or changing them to minimize risk of injury to the child in the event you have a seizure. Also, Maintain good sleep hygiene. Avoid alcohol.   Electronically signed: Dr. Kerney Elbe 10/28/2020, 12:11 PM

## 2020-10-28 NOTE — Discharge Instructions (Signed)
$'1000mg'W$  BID, Dexamethazone

## 2020-10-28 NOTE — Procedures (Signed)
Patient Name: Candace [name]  MRN: [id number]  Epilepsy Attending: Lora Havens  Referring Physician/Provider: Dr Kerney Elbe Date: 10/28/2020 Duration: 28.38 mins  Patient history: 40 year old female with known left temporofrontal grade III astrocytoma s/p chemotherapy and radiotherapy. Presents with recurrence of seizure activity. EEG to evaluate for seizure  Level of alertness: Awake  AEDs during EEG study: LEV, Ativan  Technical aspects: This EEG study was done with scalp electrodes positioned according to the 10-20 International system of electrode placement. Electrical activity was acquired at a sampling rate of '500Hz'$  and reviewed with a high frequency filter of '70Hz'$  and a low frequency filter of '1Hz'$ . EEG data were recorded continuously and digitally stored.   Description: The posterior dominant rhythm consists of 9-10 Hz activity of moderate voltage (25-35 uV) seen predominantly in posterior head regions, asymmetric ( left<right) and reactive to eye opening and eye closing. EEG showed continuous 3 to 6 Hz theta-delta slowing in left hemisphere, maximal left temporal region. Sharp waves were noted in left temporal region.  Hyperventilation and photic stimulation were not performed.     ABNORMALITY - Sharp wave, left temporal region - Continuous slow, left hemisphere, maximal left temporal region - Background asymmetry, left<right  IMPRESSION: This study showed evidence of epileptogenicity arising from left temporal region. There is also  cortical dysfunction arising from left hemisphere, maximal left temporal region likely secondary to underlying  mass. No seizures were seen throughout the recording.  Candace [name]

## 2020-10-28 NOTE — H&P (Signed)
NAME:  Candace [name], MRN:  [id number], DOB:  04-Feb-1981, LOS: 0 ADMISSION DATE:  10/28/2020, CONSULTATION DATE:  7/31 REFERRING MD:  Billy Fischer, CHIEF COMPLAINT:  Seizure   History of Present Illness:  40 y/o female with known astrocytoma presented with seizure activity for several days.  Since diagnosis and initial treatment was with radiation therapy and concurrent temodar.  Her course has been complicated by seizure. She has suffered ill effect from decadron (swelling, lack of sleep, severe mood disorder).  Her seizures were well controlled until last Friday Friday. Of note she was recently also seen in the ER in [place] this week after she had some facial twitching and had plans to see Dr. Mickeal Skinner to discuss more.  Since then she has had more facial twitching and last night she had significant right arm twitchin.  This persisted until this morning so her husband brought her to the ER at [facility]. Neurology plans anti-epileptic therapy and long term EEG monitoring. She currently has slurred speech, right sided facial droop and R sided weakness.  Pertinent  Medical History  Left temporal grade 3 astrocytoma, s/p stereotactic temporal biopsy by Dr. Marcello Moores 11/2019 Seizures related to seizure disorder> treated with [facility] Events: Including procedures, antibiotic start and stop dates in addition to other pertinent events   7/31 admission for seizure 7/31 MRI brain > infiltrating mass with associated edema in left frontal and perietal lobes, lef insula/subinsular white matter, posterior limb of internal capsule and left temporal lobe. T2/Flair hyperinstense signal abnormality increased compared to 6/24 > could be increased edema or neoplasm, unchanged tumor cyst 3.5 cm.  Mass effect with partial effacement of left lateral ventricle and 51m rightward midline shift, slightly increased compared to 6/24 MRI. Partial effacement of basal cisterns of left and left medial temporal lobe  exerting mild mass effect upon cerebral peduncle, no evidence of hydrocephalus or ventricular entrapment  Interim History / Subjective:  As above  Objective   Blood pressure 101/66, pulse (!) 58, temperature 98.6 F (37 C), temperature source Oral, resp. rate 17, SpO2 96 %.       No intake or output data in the 24 hours ending 10/28/20 1701 There were no vitals filed for this visit.  Examination:  General:  Chronically ill appearing, resting comfortably in bed HENT: NCAT OP clear PULM: CTA B, normal effort CV: RRR, no mgr GI: BS+, soft, nontender MSK: normal bulk and tone Neuro: R sided facial droop, can't move R arm, partial weakness R leg, left side strength normal, speech slow and slurred   Resolved Hospital Problem list     Assessment & Plan:  Status epilepticus due to underlying astrocytoma and edema with associated Todd's paralysis Admit to ICU  Long term EEG monitoring Anti-epileptics per neurology: more Keppra See below  Astrocytoma with edema: unclear if surrounding edema related to active seizure activity or change in the mass Strong preference to avoid steroids due to side effects I was present for a conversation between the patient, her husband and Dr. VMickeal Skinner  In order to adhere to the patient's wishes to avoid steroids the plan is to treat the seizures with steroids and hopefully see the edema and associated Todd's paralysis improve spontaneously.   However if no improvement in paralysis and slurred speech in next 24-48 hours plan to start steroids to reduce edema Consider changing from decadron to solumedrol if steroids are needed to see if solumedrol has fewer side effects than decadron Follow up recommendations from  Dr. Mickeal Skinner  Insomnia: Trazodone prn  Dysphagia from Todd's parlalysis NPO, except ice chips As speech seems to be rapidly improving, suspect she should be able to eat tonight if the rest of her neuro symptoms continue to improve    Best  Practice (right click and "Reselect all SmartList Selections" daily)   Diet/type: NPO DVT prophylaxis: prophylactic heparin  GI prophylaxis: N/A Lines: N/A Foley:  N/A Code Status:  full code Last date of multidisciplinary goals of care discussion [defer to Dr. Mickeal Skinner to lead this]  Labs   CBC: Recent Labs  Lab 10/28/20 1116  WBC 6.8  NEUTROABS 5.3  HGB 14.2  HCT 39.1  MCV 91.6  PLT 123456    Basic Metabolic Panel: Recent Labs  Lab 10/28/20 1116  NA 136  K 3.7  CL 106  CO2 23  GLUCOSE 118*  BUN 11  CREATININE 0.95  CALCIUM 9.2   GFR: CrCl cannot be calculated (Unknown ideal weight.). Recent Labs  Lab 10/28/20 1116  WBC 6.8    Liver Function Tests: Recent Labs  Lab 10/28/20 1128  AST 15  ALT 14  ALKPHOS 41  BILITOT 0.7  PROT 6.7  ALBUMIN 3.8   No results for input(s): LIPASE, AMYLASE in the last 168 hours. No results for input(s): AMMONIA in the last 168 hours.  ABG No results found for: PHART, PCO2ART, PO2ART, HCO3, TCO2, ACIDBASEDEF, O2SAT   Coagulation Profile: No results for input(s): INR, PROTIME in the last 168 hours.  Cardiac Enzymes: No results for input(s): CKTOTAL, CKMB, CKMBINDEX, TROPONINI in the last 168 hours.  HbA1C: No results found for: HGBA1C  CBG: No results for input(s): GLUCAP in the last 168 hours.  Review of Systems:   Cannot obtain due to dysarthria  Past Medical History:  She,  has a past medical history of Brain tumor ([place]) and Headache.   Surgical History:   Past Surgical History:  Procedure Laterality Date   ABDOMINAL SURGERY     APPLICATION OF CRANIAL NAVIGATION Left 12/08/2019   Procedure: APPLICATION OF CRANIAL NAVIGATION;  Surgeon: Vallarie Mare, MD;  Location: [place];  Service: Neurosurgery;  Laterality: Left;  APPLICATION OF CRANIAL NAVIGATION   FRAMELESS  BIOPSY WITH BRAINLAB Left 12/08/2019   Procedure: Left stereotactic brain biopsy with brainlab;  Surgeon: Vallarie Mare, MD;  Location: [place];   Service: Neurosurgery;  Laterality: Left;  Left stereotactic brain biopsy with brainlab   WISDOM TOOTH EXTRACTION       Social History:   reports that she has never smoked. She has never used smokeless tobacco. She reports previous alcohol use. She reports that she does not use drugs.   Family History:  Her family history includes Migraines in her mother; Testicular cancer in her father; Thyroid cancer in her father.   Allergies Allergies  Allergen Reactions   Penicillins Rash and Other (See Comments)    Pt does not recall so well     Home Medications  Prior to Admission medications   Medication Sig Start Date End Date Taking? Authorizing Provider  levETIRAcetam (KEPPRA) 500 MG tablet TAKE ONE TABLET BY MOUTH TWICE DAILY 09/27/20   Vaslow, Acey Lav, MD  MAGNESIUM PO Take 1 tablet by mouth daily.    [provider]  NON FORMULARY Take 1 Dose by mouth See admin instructions. [place] Mushroom    [provider]  NON FORMULARY Take 1 Dose by mouth See admin instructions. Frankincense    [provider]  NON  FORMULARY Take 1 Dose by mouth See admin instructions. Berberine    [provider]  NON FORMULARY Take 1 Dose by mouth See admin instructions. Melantonin    [provider]  NON FORMULARY Take 1 Dose by mouth See admin instructions. Resveratrol    [provider]  NON FORMULARY Take 1 Dose by mouth See admin instructions. Green Tea Extract    [provider]  NON FORMULARY Take 1 Dose by mouth See admin instructions. Milk Thistle    [provider]  NON FORMULARY Take 1 Dose by mouth See admin instructions. Soy isoflavones    [provider]  NON FORMULARY Take 1 Dose by mouth See admin instructions. lycopene    [provider]  NON FORMULARY Take 5 capsules by mouth daily. Serapeptase enzyme supplement    [provider]  traZODone (DESYREL) 50 MG tablet Take 1 tablet (50 mg total) by  mouth at bedtime. 08/20/20   Ventura Sellers, MD  TURMERIC PO Take 1 tablet by mouth daily.    [provider]  UNABLE TO FIND Take 1 Dose by mouth daily. Med Name: Mushroom Tincture; for immune support    [provider]     Critical care time: 60 minutes     Roselie Awkward, MD [facility] Pager: [phone] Cell: [phone] After 7:00 pm call Elink  [phone]

## 2020-10-28 NOTE — ED Triage Notes (Signed)
History of brain tumor.  Pt with R sided seizures and R hand weakness since Friday.  Reports L sided headache (tumor on Left).  CT at [facility] ED on Friday.  Keppra increased at that time.

## 2020-10-28 NOTE — Progress Notes (Signed)
LTM EEG hooked up and running - no initial skin breakdown - push button tested - neuro notified. [facility] monitoring.  

## 2020-10-28 NOTE — Progress Notes (Signed)
EEG complete - results pending 

## 2020-10-28 NOTE — ED Provider Notes (Signed)
Emergency Medicine Provider Triage Evaluation Note  Candace [name] , a 40 y.o. female  was evaluated in triage.  Pt complains of right sided weakness. Hx of tumor. Followed by Vaslow. Seen by [facility] with increased seizures. Keppra increased. Still having right extremity weakness, facial ticks.  Review of Systems  Positive: Weakness, seizure like activity Negative:   Physical Exam  BP 118/77 (BP Location: Left Arm)   Pulse 84   Temp 98.6 F (37 C) (Oral)   Resp 17   SpO2 100%  Gen:   Awake, no distress   Resp:  Normal effort  MSK:   Difficulty with right extremities movement Neuro:  Weakness on right, facial ticks Other:    Medical Decision Making  Medically screening exam initiated at 11:15 AM.  Appropriate orders placed.  Candace [name] was informed that the remainder of the evaluation will be completed by another provider, this initial triage assessment does not replace that evaluation, and the importance of remaining in the ED until their evaluation is complete.  Weakness, seizure   Terius Jacuinde A, PA-C 10/28/20 1117    Wyvonnia Dusky, MD 10/29/20 [phone]

## 2020-10-28 NOTE — ED Provider Notes (Signed)
[facility] EMERGENCY DEPARTMENT Provider Note   CSN: [id number] Arrival date & time: 10/28/20  1102     History Chief Complaint  Patient presents with   Seizures    Candace [name] is a 40 y.o. female.  HPI     40yo female with history of astrocytoma of frontal lobe with radiation/chemotherapy, focal seizure, presents with concern for increasing seizures and right sided weakness.   Had seizure at time of diagnosis in fall but has none since then, been on Keppra, has had waxing and waning right sided symptoms/headaches however mild abnormality like difficulty texting--over the weakend has had worsening symptoms following seizure on Friday. Friday, had a seizure when they were out to eat, went to [place], received medications including keppra, ativan, and had CT and discharged with increased keppra.  Over the weekend she continued to have right sided weakness but waxing and waning, had 2 seizures this AM prior to arrival, seemed to be "vibrating" last night in bed but would say she was ok.  Right sided weakness since Friday, gradually improving legs, right sided arm and hand weakness worse  Headache currently 3/10, worse htan usual  Seizure in triage and on arrival to room  No visual changes, no chest pain or dyspnea, abdominal pain, nausea, vomiting   Past Medical History:  Diagnosis Date   Brain tumor [facility])    Headache     Patient Active Problem List   Diagnosis Date Noted   Seizure ([place]) 10/28/2020   Primary malignant neoplasm of frontal lobe ([place]) 01/13/2020   Focal seizure ([place]) 12/07/2019   Astrocytoma of frontal lobe ([place]) 12/07/2019    Past Surgical History:  Procedure Laterality Date   ABDOMINAL SURGERY     APPLICATION OF CRANIAL NAVIGATION Left 12/08/2019   Procedure: APPLICATION OF CRANIAL NAVIGATION;  Surgeon: Vallarie Mare, MD;  Location: [place];  Service: Neurosurgery;  Laterality: Left;  APPLICATION OF CRANIAL NAVIGATION    FRAMELESS  BIOPSY WITH BRAINLAB Left 12/08/2019   Procedure: Left stereotactic brain biopsy with brainlab;  Surgeon: Vallarie Mare, MD;  Location: [place];  Service: Neurosurgery;  Laterality: Left;  Left stereotactic brain biopsy with brainlab   WISDOM TOOTH EXTRACTION       OB History   No obstetric history on file.     Family History  Problem Relation Age of Onset   Migraines Mother    Thyroid cancer Father    Testicular cancer Father     Social History   Tobacco Use   Smoking status: Never   Smokeless tobacco: Never  Substance Use Topics   Alcohol use: Not Currently   Drug use: Never    Home Medications Prior to Admission medications   Medication Sig Start Date End Date Taking? Authorizing Provider  levETIRAcetam (KEPPRA) 500 MG tablet TAKE ONE TABLET BY MOUTH TWICE DAILY 09/27/20   Vaslow, Acey Lav, MD  MAGNESIUM PO Take 1 tablet by mouth daily.    [provider]  NON FORMULARY Take 1 Dose by mouth See admin instructions. [place] Mushroom    [provider]  NON FORMULARY Take 1 Dose by mouth See admin instructions. Frankincense    [provider]  NON FORMULARY Take 1 Dose by mouth See admin instructions. Berberine    [provider]  NON FORMULARY Take 1 Dose by mouth See admin instructions. Melantonin    [provider]  NON FORMULARY Take 1 Dose by mouth See admin instructions. Resveratrol  [provider]  NON FORMULARY Take 1 Dose by mouth See admin instructions. Green Tea Extract    [provider]  NON FORMULARY Take 1 Dose by mouth See admin instructions. Milk Thistle    [provider]  NON FORMULARY Take 1 Dose by mouth See admin instructions. Soy isoflavones    [provider]  NON FORMULARY Take 1 Dose by mouth See admin instructions. lycopene    [provider]  NON FORMULARY Take 5 capsules by mouth daily. Serapeptase enzyme supplement    [provider]   traZODone (DESYREL) 50 MG tablet Take 1 tablet (50 mg total) by mouth at bedtime. 08/20/20   Ventura Sellers, MD  TURMERIC PO Take 1 tablet by mouth daily.    [provider]  UNABLE TO FIND Take 1 Dose by mouth daily. Med Name: Mushroom Tincture; for immune support    [provider]    Allergies    Penicillins  Review of Systems   Review of Systems  Constitutional:  Negative for fever.  Respiratory:  Negative for shortness of breath.   Cardiovascular:  Negative for chest pain.  Gastrointestinal:  Negative for abdominal pain, nausea and vomiting.  Neurological:  Positive for seizures, facial asymmetry, weakness and headaches.   Physical Exam Updated Vital Signs BP 101/66   Pulse (!) 58   Temp 98.6 F (37 C) (Oral)   Resp 17   SpO2 96%   Physical Exam Vitals and nursing note reviewed.  Constitutional:      General: She is not in acute distress.    Appearance: She is well-developed. She is not diaphoretic.  HENT:     Head: Normocephalic and atraumatic.  Eyes:     Conjunctiva/sclera: Conjunctivae normal.  Cardiovascular:     Rate and Rhythm: Normal rate and regular rhythm.     Heart sounds: Normal heart sounds. No murmur heard.   No friction rub. No gallop.  Pulmonary:     Effort: Pulmonary effort is normal. No respiratory distress.     Breath sounds: Normal breath sounds. No wheezing or rales.  Abdominal:     General: There is no distension.     Palpations: Abdomen is soft.     Tenderness: There is no abdominal tenderness. There is no guarding.  Musculoskeletal:        General: No tenderness.     Cervical back: Normal range of motion.  Skin:    General: Skin is warm and dry.     Findings: No erythema or rash.  Neurological:     Mental Status: She is alert.     Comments: On my arrival to room, husband began to give history and she was able to help change, afterwhich began to have right sided facial twitching lasting about 1 minute, received  ativan and exam limited some by medication, sleepier, speech slow. Observed Dr. Cheral Marker performing exam, right sided weakness right upper extremity worse than right LE, 4/5 RLE, 2/5 RUE/grip strength, right facial droop    ED Results / Procedures / Treatments   Labs (all labs ordered are listed, but only abnormal results are displayed) Labs Reviewed  CBC WITH DIFFERENTIAL/PLATELET - Abnormal; Notable for the following components:      Result Value   MCHC 36.3 (*)    RDW 10.9 (*)    All other components within normal limits  BASIC METABOLIC PANEL - Abnormal; Notable for the following components:   Glucose, Bld 118 (*)  All other components within normal limits  SARS CORONAVIRUS 2 (TAT 6-24 HRS)  HEPATIC FUNCTION PANEL  LEVETIRACETAM LEVEL  I-STAT BETA HCG BLOOD, ED ([facility], [facility], [facility] ONLY)    EKG None  Radiology CT Head Wo Contrast  Result Date: 10/28/2020 CLINICAL DATA:  Seizure, abnormal neuro exam. Additional provided: Right-sided weakness, history of tumor. EXAM: CT HEAD WITHOUT CONTRAST TECHNIQUE: Contiguous axial images were obtained from the base of the skull through the vertex without intravenous contrast. COMPARISON:  Head CT 10/26/2020.  Brain MRI 10/21/2020. FINDINGS: Brain: Cerebral volume is normal. Redemonstrated large infiltrative mass with associated edema involving the left frontal lobe, left parietal lobe, left insula, left subinsular region, posterior limb of left internal capsule and anteromedial left temporal lobe. Redemonstrated tumor cyst within the left frontal lobe, measuring up to 3.5 cm in greatest dimension, not significantly changed from the head CT of 10/26/2020. As before, there are small nodular foci of calcification along the periphery of the tumor cyst. Mass effect appears stable as compared to the head CT of 10/26/2020 with partial effacement of the left lateral ventricle and 6 mm rightward midline shift measured at the level of the septum pellucidum. No  evidence of acute intracranial hemorrhage or acute demarcated cortical infarction. No extra-axial fluid collection. Vascular: No hyperdense vessel. Skull: Normal. Negative for fracture or focal lesion. Sinuses/Orbits: Visualized orbits show no acute finding. No significant paranasal sinus disease at the imaged levels. IMPRESSION: No significant interval change as compared to the head CT of 10/26/2020. Redemonstrated large infiltrative mass with associated edema within the left frontal lobe, parietal lobe, insula/subinsular region, posterior limb of left internal capsule and anteromedial temporal lobe. Stable size of an associated tumor cyst again measuring up to 3.5 cm in greatest dimension. Unchanged mass effect with partial effacement of the left lateral ventricle and 6 mm rightward midline shift. No evidence of acute intracranial hemorrhage or acute infarction. Electronically Signed   By: Kellie Simmering DO   On: 10/28/2020 12:24   MR BRAIN WO CONTRAST  Result Date: 10/28/2020 CLINICAL DATA:  Seizure, abnormal neuro exam. EXAM: MRI HEAD WITHOUT CONTRAST TECHNIQUE: Multiplanar, multiecho pulse sequences of the brain and surrounding structures were obtained without intravenous contrast. COMPARISON:  Prior head CT examinations 10/28/2020 and earlier. Brain MRI 09/21/2020. FINDINGS: Brain: Redemonstrated large infiltrating mass with associated edema in the left frontal and parietal lobes, left insula and subinsular white matter, posterior limb of left internal capsule and left temporal lobe. T2/FLAIR hyperintense signal abnormality has increased in extent as compared to the prior brain MRI of 09/21/2020, and this may reflect an interval increase in associated edema or interval progression of infiltrating neoplasm. Unchanged size of an associated tumor cyst within the posterior left frontal lobe measuring up to 3.5 cm in greatest dimension. Small foci of calcification along the periphery of the tumor cyst were better  appreciated on recent prior head CTs. Mass effect with partial effacement of the left lateral ventricle and 6 mm rightward midline shift, unchanged as compared to the head CT performed earlier today, but slightly increased as compared to the brain MRI of 09/21/2020 (midline shift measured 5 mm on the prior MRI). Increased from the prior MRI, there is partial effacement of the basal cisterns on the left, and the medial left temporal lobe exerts mild mass effect upon the left cerebral peduncle. No evidence of hydrocephalus or ventricular entrapment. No extra-axial fluid collection. Vascular: Expected proximal arterial flow voids. Redemonstrated right frontal lobe developmental venous anomaly. Skull  and upper cervical spine: No focal marrow lesion. Sinuses/Orbits: Visualized orbits show no acute finding. No significant paranasal sinus disease. IMPRESSION: Redemonstrated large infiltrating mass with associated edema in the left frontal and parietal lobes, left insula/subinsular white matter, posterior limb of left internal capsule and left temporal lobe. T2/FLAIR hyperintense signal abnormality has increased in extent as compared to the prior brain MRI of 09/21/2020, and this may reflect an interval increase in associated edema or interval progression of infiltrating neoplasm. Unchanged size of an associated tumor cyst within the posterior left frontal lobe, again measuring up to 3.5 cm in greatest dimension. Mass effect with partial effacement of the left lateral ventricle and 6 mm rightward midline shift, unchanged as compared to the head CT performed earlier today, but slightly increased as compared to the brain MRI of 09/21/2020 (midline shift measured 5 mm on the prior MRI). Also increased from the prior MRI, there is partial effacement of the basal cisterns on the left, with the medial left temporal lobe exerting mild mass effect upon the left cerebral peduncle. No evidence of hydrocephalus or ventricular  entrapment at this time. Electronically Signed   By: Kellie Simmering DO   On: 10/28/2020 13:48    Procedures .Critical Care  Date/Time: 10/28/2020 4:33 PM Performed by: Gareth Morgan, MD Authorized by: Gareth Morgan, MD   Critical care provider statement:    Critical care time (minutes):  20   Critical care was time spent personally by me on the following activities:  Discussions with consultants, evaluation of patient's response to treatment, examination of patient, ordering and performing treatments and interventions, ordering and review of laboratory studies, ordering and review of radiographic studies, pulse oximetry, re-evaluation of patient's condition, obtaining history from patient or surrogate and review of old charts   Medications Ordered in ED Medications  levETIRAcetam (KEPPRA) IVPB 1000 mg/100 mL premix (has no administration in time range)  dexamethasone (DECADRON) tablet 4 mg (has no administration in time range)  docusate sodium (COLACE) capsule 100 mg (has no administration in time range)  polyethylene glycol (MIRALAX / GLYCOLAX) packet 17 g (has no administration in time range)  0.9 %  sodium chloride infusion (has no administration in time range)  LORazepam (ATIVAN) 2 MG/ML injection (2 mg Intravenous Given 10/28/20 1143)  levETIRAcetam (KEPPRA) IVPB 1500 mg/ 100 mL premix (0 mg Intravenous Stopped 10/28/20 1410)  diphenhydrAMINE (BENADRYL) injection 25 mg (25 mg Intravenous Given 10/28/20 1227)  methylPREDNISolone sodium succinate (SOLU-MEDROL) 125 mg/2 mL injection 125 mg (125 mg Intravenous Given 10/28/20 1221)    ED Course  I have reviewed the triage vital signs and the nursing notes.  Pertinent labs & imaging results that were available during my care of the patient were reviewed by me and considered in my medical decision making (see chart for details).    MDM Rules/Calculators/A&P                           40yo female with history of astrocytoma of frontal  lobe with radiation/chemotherapy, focal seizure, presents with concern for increasing seizures and right sided weakness.  Had focal seizure in triage, and another focal seizure at the timing of my evaluation lasting approximately 1 minute.  Ordered 2 mg of IV Ativan.  Dr. Cheral Marker Neurology came to bedside and evaluated patient.  She is given 1500 mg of Keppra.  Noted to have right upper extremity weakness.  CT head with infiltrative mass with associated edema.  MRI shows large infiltrating mass with associated edema in the left frontal and parietal lobes, left insula/subinsular white matter, posterior limp of the left internal capsule and left temporal lobe with mass effect increased from MRI 6/24, partial effacement of basal cisterna, mass effect upon the left cerebral peduncle.  Paged NSU 220PM. Discussed with Dr. Marcello Moores NSU, reports it is not surgical.  Discussed with Dr. Cheral Marker who also discussed with Dr. Debbe Odea for admission to ICU for LTM for status and steroids.  Candace [name] states she does not want steroids based on prior side effects. Dr. Mickeal Skinner also speaking to patient and Dr. Lake Bells PCCM at bedside.    Final Clinical Impression(s) / ED Diagnoses Final diagnoses:  Seizures [facility])    Rx / DC Orders ED Discharge Orders     None        Gareth Morgan, MD 10/28/20 [phone]

## 2020-10-29 DIAGNOSIS — R569 Unspecified convulsions: Secondary | ICD-10-CM | POA: Diagnosis not present

## 2020-10-29 DIAGNOSIS — C719 Malignant neoplasm of brain, unspecified: Secondary | ICD-10-CM

## 2020-10-29 DIAGNOSIS — G8384 Todd's paralysis (postepileptic): Secondary | ICD-10-CM

## 2020-10-29 LAB — BASIC METABOLIC PANEL
Anion gap: 9 (ref 5–15)
BUN: 16 mg/dL (ref 6–20)
CO2: 19 mmol/L — ABNORMAL LOW (ref 22–32)
Calcium: 8.8 mg/dL — ABNORMAL LOW (ref 8.9–10.3)
Chloride: 108 mmol/L (ref 98–111)
Creatinine, Ser: 0.74 mg/dL (ref 0.44–1.00)
GFR, Estimated: >60 mL/min (ref >60)
Glucose, Bld: 121 mg/dL — ABNORMAL HIGH (ref 70–99)
Potassium: 3.9 mmol/L (ref 3.5–5.1)
Sodium: 136 mmol/L (ref 135–145)

## 2020-10-29 LAB — SARS CORONAVIRUS 2 (TAT 6-24 HRS): SARS Coronavirus 2: NEGATIVE

## 2020-10-29 LAB — CBC
HCT: 34.9 % — ABNORMAL LOW (ref 36.0–46.0)
Hemoglobin: 12.7 g/dL (ref 12.0–15.0)
MCH: 33.5 pg (ref 26.0–34.0)
MCHC: 36.4 g/dL — ABNORMAL HIGH (ref 30.0–36.0)
MCV: 92.1 fL (ref 80.0–100.0)
Platelets: 173 10*3/uL (ref 150–400)
RBC: 3.79 MIL/uL — ABNORMAL LOW (ref 3.87–5.11)
RDW: 10.8 % — ABNORMAL LOW (ref 11.5–15.5)
WBC: 8.5 10*3/uL (ref 4.0–10.5)
nRBC: 0 % (ref 0.0–0.2)

## 2020-10-29 LAB — MRSA NEXT GEN BY PCR, NASAL: MRSA by PCR Next Gen: NOT DETECTED

## 2020-10-29 MED ORDER — LEVETIRACETAM IN NACL 500 MG/100ML IV SOLN
500.0000 mg | Freq: Once | INTRAVENOUS | Status: AC
  Administered 2020-10-29: 500 mg via INTRAVENOUS
  Filled 2020-10-29: qty 100

## 2020-10-29 MED ORDER — LORAZEPAM 2 MG/ML IJ SOLN: 2.0000 mg | INTRAMUSCULAR | Status: DC | PRN

## 2020-10-29 MED ORDER — ACETAMINOPHEN 325 MG PO TABS
650.0000 mg | ORAL_TABLET | Freq: Four times a day (QID) | ORAL | Status: DC | PRN
  Administered 2020-10-29: 650 mg via ORAL
  Filled 2020-10-29: qty 2

## 2020-10-29 MED ORDER — SODIUM CHLORIDE 0.9 % IV SOLN
200.0000 mg | Freq: Once | INTRAVENOUS | Status: AC
  Administered 2020-10-29: 200 mg via INTRAVENOUS
  Filled 2020-10-29: qty 20

## 2020-10-29 MED ORDER — LEVETIRACETAM 750 MG PO TABS: 1500.0000 mg | ORAL_TABLET | Freq: Two times a day (BID) | ORAL | Status: DC

## 2020-10-29 MED ORDER — LEVETIRACETAM 750 MG PO TABS: 2000.0000 mg | ORAL_TABLET | Freq: Two times a day (BID) | ORAL | Status: DC

## 2020-10-29 MED ORDER — SODIUM CHLORIDE 0.9 % IV SOLN
2000.0000 mg | Freq: Two times a day (BID) | INTRAVENOUS | Status: DC
  Administered 2020-10-29 – 2020-10-30 (×2): 2000 mg via INTRAVENOUS
  Filled 2020-10-29 (×3): qty 20

## 2020-10-29 NOTE — H&P (Signed)
NAME:  Candace [name], MRN:  [id number], DOB:  1980/11/13, LOS: 1 ADMISSION DATE:  10/28/2020, CONSULTATION DATE:  7/31 REFERRING MD:  Billy Fischer, CHIEF COMPLAINT:  Seizure   History of Present Illness:  40 y/o female with known astrocytoma presented with seizure activity for several days.  Since diagnosis and initial treatment was with radiation therapy and concurrent temodar.  Her course has been complicated by seizure. She has suffered ill effect from decadron (swelling, lack of sleep, severe mood disorder).  Her seizures were well controlled until last Friday Friday. Of note she was recently also seen in the ER in [place] this week after she had some facial twitching and had plans to see Dr. Mickeal Skinner to discuss more.  Since then she has had more facial twitching and last night she had significant right arm twitchin.  This persisted until this morning so her husband brought her to the ER at [facility] Neurology plans anti-epileptic therapy and long term EEG monitoring. She currently has slurred speech, right sided facial droop and R sided weakness.    Significant Hospital Events: Including procedures, antibiotic start and stop dates in addition to other pertinent events   7/31 admission for seizure 7/31 MRI brain > infiltrating mass with associated edema in left frontal and perietal lobes, lef insula/subinsular white matter, posterior limb of internal capsule and left temporal lobe. T2/Flair hyperinstense signal abnormality increased compared to 6/24 > could be increased edema or neoplasm, unchanged tumor cyst 3.5 cm.  Mass effect with partial effacement of left lateral ventricle and 71m rightward midline shift, slightly increased compared to 6/24 MRI. Partial effacement of basal cisterns of left and left medial temporal lobe exerting mild mass effect upon cerebral peduncle, no evidence of hydrocephalus or ventricular entrapment  Interim History / Subjective:  Patient had 3 seizures overnight,  confirmed with EEG Right-sided weakness has improved from yesterday  Objective   Blood pressure 107/70, pulse 70, temperature 100.1 F (37.8 C), temperature source Axillary, resp. rate 19, height '5\' 8"'$  (1.727 m), weight 60 kg, SpO2 97 %.        Intake/Output Summary (Last 24 hours) at 10/29/2020 [id number] data filed at 10/29/2020 0800 Gross per 24 hour  Intake 865.83 ml  Output 600 ml  Net 265.83 ml   Filed Weights   10/28/20 1800 10/29/20 0200  Weight: 58.9 kg 60 kg    Examination:  General: Acutely ill appearing young Caucasian female, resting comfortably in bed HENT: NCAT, moist mucous membranes, no JVD PULM: Clear to auscultation bilaterally, no wheezes or rhonchi CV: RRR, no mgr GI: BS+, soft, nontender MSK: normal bulk and tone Neuro: No facial droop, cranial nerves are intact, has slurred speech right upper extremity is antigravity but with drift left side and right lower extremities 5/5   Resolved Hospital Problem list     Assessment & Plan:  Recurrent seizures due to underlying astrocytoma and edema with associated Todd's paralysis Patient is still having seizures, confirmed with EEG Neurology is following, appreciate their recommendations Keppra was increased to 1500 twice daily  Astrocytoma with edema: unclear if surrounding edema related to active seizure activity or change in the mass Strong preference to avoid steroids due to side effects Patient had conversation with Dr. VMickeal Skinnerand PCCM team, she would like to avoid steroid at any cost unless it has to be given So far her right upper extremity weakness has improved so we will continue to hold off on giving steroids Follow-up neurosurgery  Insomnia: Trazodone prn  Dysphagia from Todd's parlalysis Resolved    Best Practice (right click and "Reselect all SmartList Selections" daily)   Diet/type: Regular DVT prophylaxis: Prophylactic enoxaparin GI prophylaxis: N/A Lines: N/A Foley:  N/A Code  Status:  full code Last date of multidisciplinary goals of care discussion [Per primary team]  Labs   CBC: Recent Labs  Lab 10/28/20 1116 10/29/20 0158  WBC 6.8 8.5  NEUTROABS 5.3  --   HGB 14.2 12.7  HCT 39.1 34.9*  MCV 91.6 92.1  PLT 215 [id number]    Basic Metabolic Panel: Recent Labs  Lab 10/28/20 1116 10/29/20 0158  NA 136 136  K 3.7 3.9  CL 106 108  CO2 23 19*  GLUCOSE 118* 121*  BUN 11 16  CREATININE 0.95 0.74  CALCIUM 9.2 8.8*   GFR: Estimated Creatinine Clearance: 90.3 mL/min (by C-G formula based on SCr of 0.74 mg/dL). Recent Labs  Lab 10/28/20 1116 10/29/20 0158  WBC 6.8 8.5    Liver Function Tests: Recent Labs  Lab 10/28/20 1128  AST 15  ALT 14  ALKPHOS 41  BILITOT 0.7  PROT 6.7  ALBUMIN 3.8   No results for input(s): LIPASE, AMYLASE in the last 168 hours. No results for input(s): AMMONIA in the last 168 hours.  ABG No results found for: PHART, PCO2ART, PO2ART, HCO3, TCO2, ACIDBASEDEF, O2SAT   Coagulation Profile: No results for input(s): INR, PROTIME in the last 168 hours.  Cardiac Enzymes: No results for input(s): CKTOTAL, CKMB, CKMBINDEX, TROPONINI in the last 168 hours.  HbA1C: No results found for: HGBA1C  CBG: No results for input(s): GLUCAP in the last 168 hours.   Total critical care time: 37 minutes  Performed by: [place] care time was exclusive of separately billable procedures and treating other patients.   Critical care was necessary to treat or prevent imminent or life-threatening deterioration.   Critical care was time spent personally by me on the following activities: development of treatment plan with patient and/or surrogate as well as nursing, discussions with consultants, evaluation of patient's response to treatment, examination of patient, obtaining history from patient or surrogate, ordering and performing treatments and interventions, ordering and review of laboratory studies, ordering and  review of radiographic studies, pulse oximetry and re-evaluation of patient's condition.   Jacky Kindle MD  Pulmonary Critical Care See Amion for pager If no response to pager, please call [phone] until 7pm After 7pm, Please call E-link [phone]

## 2020-10-29 NOTE — Procedures (Signed)
Patient Name: Candace [name]  MRN: [id number]  Epilepsy Attending: Lora Havens  Referring Physician/Provider: Dr Kerney Elbe Duration: 10/28/2020 1654 to 10/29/2020 1654   Patient history: 40 year old female with known left temporofrontal grade III astrocytoma s/p chemotherapy and radiotherapy. Presents with recurrence of seizure activity. EEG to evaluate for seizure   Level of alertness: Awake, asleep   AEDs during EEG study: LEV   Technical aspects: This EEG study was done with scalp electrodes positioned according to the 10-20 International system of electrode placement. Electrical activity was acquired at a sampling rate of '500Hz'$  and reviewed with a high frequency filter of '70Hz'$  and a low frequency filter of '1Hz'$ . EEG data were recorded continuously and digitally stored.   Description: The posterior dominant rhythm consists of 9-10 Hz activity of moderate voltage (25-35 uV) seen predominantly in posterior head regions, symmetric and reactive to eye opening and eye closing.  Sleep was characterized by vertex waves, sleep spindles (12 to 14 Hz), maximal frontocentral region. EEG showed continuous 3 to 6 Hz theta-delta slowing in left hemisphere, maximal left temporal region. Lateralized periodic discharges were noted in left temporal region at 0.5-'1hz'$  which at times appear rhythmic.  Focal seizures were also noted arising from left centro-temporal region on 10/29/2020 at 0526, 0735, 0824, 1227 and 1304, average duration of seizure about 3 minute.  During the seizures, patient was either noted to be staring off or had right facial twitching.  Hyperventilation and photic stimulation were not performed.      ABNORMALITY - Seizure, left centro- temporal region - Lateralized periodic discharges left temporal region ( LPD+) - Continuous slow, left hemisphere, maximal left temporal region   IMPRESSION: This study showed seizures during which patient was noted to be staring off and at times had  right facial twitching.  Seizures were noted to be arising from left centro-temporal region, on 10/29/2020 at 0526, 0735, 0824, 1227 and 1304, average duration of seizure about 3 minute. There is also evidence of epileptogenicity arising from left temporal region as well as  cortical dysfunction arising from left hemisphere, maximal left temporal region likely secondary to underlying mass. The lateralized periodic discharges at times appear rhythmic with high potential for seizure recurrence.     Candace [name] Candace [name]

## 2020-10-29 NOTE — Progress Notes (Signed)
vLTM EEG maintenance complete. No skin breakdown at [place] FP2 F7. Continue to monitor

## 2020-10-29 NOTE — Progress Notes (Addendum)
Subjective: Had 2 more seizures this morning with right facial twitching lasting about 15 seconds each.  States that she is feeling stronger and her speech is better.  Husband at bedside  ROS: negative except above  Examination  Vital signs in last 24 hours: Temp:  [98.2 F (36.8 C)-100.1 F (37.8 C)] 98.3 F (36.8 C) (08/01 1200) Pulse Rate:  [52-72] 70 (08/01 0800) Resp:  [12-26] 17 (08/01 1500) BP: (86-121)/(54-80) 114/71 (08/01 1500) SpO2:  [91 %-100 %] 97 % (08/01 0800) Weight:  [58.9 kg-60 kg] 60 kg (08/01 0200)  General: lying in bed, NAD CVS: pulse-normal rate and rhythm RS: breathing comfortably, CTAB Extremities: normal, warm  Neuro: MS: Alert, oriented, follows commands CN: pupils equal and reactive,  EOMI, right facial droop, tongue midline, normal sensation over face, Motor: 5/5 strength in LUE/LLE, 4+/5 in RUE proximally, 4-/5 in RUE distally, 4/5 in RLE Coordination: FTN intact Gait: not tested  Basic Metabolic Panel: Recent Labs  Lab 10/28/20 1116 10/29/20 0158  NA 136 136  K 3.7 3.9  CL 106 108  CO2 23 19*  GLUCOSE 118* 121*  BUN 11 16  CREATININE 0.95 0.74  CALCIUM 9.2 8.8*    CBC: Recent Labs  Lab 10/28/20 1116 10/29/20 0158  WBC 6.8 8.5  NEUTROABS 5.3  --   HGB 14.2 12.7  HCT 39.1 34.9*  MCV 91.6 92.1  PLT 215 173     Coagulation Studies: No results for input(s): LABPROT, INR in the last 72 hours.  Imaging MRI without contrast 10/28/2020: Redemonstrated large infiltrating mass with associated edema in the left frontal and parietal lobes, left insula/subinsular white matter, posterior limb of left internal capsule and left temporal lobe. T2/FLAIR hyperintense signal abnormality has increased in extent as compared to the prior brain MRI of 09/21/2020, and this may reflect an interval increase in associated edema or interval progression of infiltrating neoplasm. Unchanged size of an associated tumor cyst within the posterior left frontal  lobe, again measuring up to 3.5 cm in greatest dimension.   Mass effect with partial effacement of the left lateral ventricle and 6 mm rightward midline shift, unchanged as compared to the head CT performed earlier today, but slightly increased as compared to the brain MRI of 09/21/2020 (midline shift measured 5 mm on the prior MRI). Also increased from the prior MRI, there is partial effacement of the basal cisterns on the left, with the medial left temporal lobe exerting mild mass effect upon the left cerebral peduncle.   No evidence of hydrocephalus or ventricular entrapment at this time.  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  Epilepsy with breakthrough seizure Left temporal astrocytoma, status post chemoradiation Cerebral edema Right Todd's paralysis -LTM EEG showed 3 more seizures this morning  Recommendations -We will increase Keppra to 1500 mg twice daily -If seizures persist, can increase Keppra to 2000 mg twice daily. -Next AED of choice will be Vimpat to minimize interactions with chemotherapy if needed in future -Also discussed potentially using steroids if seizures persist.  Patient would like to avoid steroids as much as possible due to previous side effects including rash, mood disturbance.  However, if needed, would recommend discussing with Dr. Mickeal Skinner as well as patient -Continue seizure precautions - Continue LTM EEG until patient is seizure-free for about 24 hours -As needed IV Ativan 2 mg for clinical seizure-like activity lasting more than 5 minutes -Management of rest  of comorbidities per primary team  CRITICAL CARE Performed by: Lora Havens   Total critical care time: 35 minutes  Critical care time was exclusive of separately billable procedures and treating other patients.  Critical care was necessary to treat or prevent imminent or  life-threatening deterioration.  Critical care was time spent personally by me on the following activities: development of treatment plan with patient and/or surrogate as well as nursing, discussions with consultants, evaluation of patient's response to treatment, examination of patient, obtaining history from patient or surrogate, ordering and performing treatments and interventions, ordering and review of laboratory studies, ordering and review of radiographic studies, pulse oximetry and re-evaluation of patient's condition.    Zeb Comfort Epilepsy [facility] For questions after 5pm please refer to AMION to reach the Neurologist on call

## 2020-10-30 DIAGNOSIS — D330 Benign neoplasm of brain, supratentorial: Secondary | ICD-10-CM | POA: Diagnosis not present

## 2020-10-30 DIAGNOSIS — G936 Cerebral edema: Secondary | ICD-10-CM

## 2020-10-30 DIAGNOSIS — R569 Unspecified convulsions: Secondary | ICD-10-CM | POA: Diagnosis not present

## 2020-10-30 DIAGNOSIS — G8384 Todd's paralysis (postepileptic): Secondary | ICD-10-CM | POA: Diagnosis not present

## 2020-10-30 MED ORDER — LEVETIRACETAM 750 MG PO TABS
2000.0000 mg | ORAL_TABLET | Freq: Two times a day (BID) | ORAL | Status: DC
  Administered 2020-10-30 – 2020-11-01 (×4): 2000 mg via ORAL
  Filled 2020-10-30 (×4): qty 1

## 2020-10-30 MED ORDER — DEXAMETHASONE 4 MG PO TABS: 8.0000 mg | ORAL_TABLET | Freq: Two times a day (BID) | ORAL | Status: DC

## 2020-10-30 MED ORDER — DEXAMETHASONE SODIUM PHOSPHATE 10 MG/ML IJ SOLN
8.0000 mg | Freq: Once | INTRAMUSCULAR | Status: AC
  Administered 2020-10-30: 8 mg via INTRAVENOUS
  Filled 2020-10-30: qty 1

## 2020-10-30 MED ORDER — SODIUM CHLORIDE 0.9 % IV SOLN
100.0000 mg | Freq: Two times a day (BID) | INTRAVENOUS | Status: DC
  Administered 2020-10-30: 100 mg via INTRAVENOUS
  Filled 2020-10-30 (×2): qty 10

## 2020-10-30 MED ORDER — LACOSAMIDE 50 MG PO TABS
100.0000 mg | ORAL_TABLET | Freq: Two times a day (BID) | ORAL | Status: DC
  Administered 2020-10-30 – 2020-10-31 (×3): 100 mg via ORAL
  Filled 2020-10-30 (×3): qty 2

## 2020-10-30 MED ORDER — DEXAMETHASONE 4 MG PO TABS
8.0000 mg | ORAL_TABLET | Freq: Two times a day (BID) | ORAL | Status: DC
  Administered 2020-10-31 – 2020-11-01 (×3): 8 mg via ORAL
  Filled 2020-10-30 (×3): qty 2

## 2020-10-30 NOTE — Progress Notes (Signed)
NAME:  Candace [name], MRN:  [id number], DOB:  1980-09-16, LOS: 2 ADMISSION DATE:  10/28/2020, CONSULTATION DATE:  7/31 REFERRING MD:  Billy Fischer, CHIEF COMPLAINT:  Seizure   History of Present Illness:  40 y/o female with known astrocytoma presented with seizure activity for several days.  Since diagnosis and initial treatment was with radiation therapy and concurrent temodar.  Her course has been complicated by seizure. She has suffered ill effect from decadron (swelling, lack of sleep, severe mood disorder).  Her seizures were well controlled until last Friday Friday. Of note she was recently also seen in the ER in [place] this week after she had some facial twitching and had plans to see Dr. Mickeal Skinner to discuss more.  Since then she has had more facial twitching and last night she had significant right arm twitchin.  This persisted until this morning so her husband brought her to the ER at [facility]. Neurology plans anti-epileptic therapy and long term EEG monitoring. She currently has slurred speech, right sided facial droop and R sided weakness.    Significant Hospital Events: Including procedures, antibiotic start and stop dates in addition to other pertinent events   7/31 admission for seizure 7/31 MRI brain > infiltrating mass with associated edema in left frontal and perietal lobes, lef insula/subinsular white matter, posterior limb of internal capsule and left temporal lobe. T2/Flair hyperinstense signal abnormality increased compared to 6/24 > could be increased edema or neoplasm, unchanged tumor cyst 3.5 cm.  Mass effect with partial effacement of left lateral ventricle and 53m rightward midline shift, slightly increased compared to 6/24 MRI. Partial effacement of basal cisterns of left and left medial temporal lobe exerting mild mass effect upon cerebral peduncle, no evidence of hydrocephalus or ventricular entrapment  Interim History / Subjective:  Per EEG report patient is having  seizures almost every hour lasting for about 1 to 3 minutes This morning patient was very emotional considering her seizures are not well controlled despite increasing medications.  Objective   Blood pressure 117/86, pulse 70, temperature 98.8 F (37.1 C), temperature source Oral, resp. rate 13, height '5\' 8"'$  (1.727 m), weight 61.3 kg, SpO2 98 %.        Intake/Output Summary (Last 24 hours) at 10/30/2020 1009 Last data filed at 10/30/2020 0800 Gross per 24 hour  Intake 780.44 ml  Output --  Net 780.44 ml   Filed Weights   10/28/20 1800 10/29/20 0200 10/30/20 0500  Weight: 58.9 kg 60 kg 61.3 kg    Examination:  General: Acutely ill appearing young Caucasian female, resting comfortably in bed HENT: NCAT, moist mucous membranes, no JVD PULM: Clear to auscultation bilaterally, no wheezes or rhonchi CV: RRR, no mgr GI: BS+, soft, nontender MSK: normal bulk and tone Neuro: No facial droop, cranial nerves are intact, has slurred speech right upper extremity is antigravity but with drift left side and right lower extremities 5/5   Resolved Hospital Problem list     Assessment & Plan:  Recurrent seizures due to underlying astrocytoma and edema with associated right-sided Todd's paralysis Patient continued to have seizures almost every hour, confirmed with EEG Neurology is following, appreciate their recommendations Keppra yesterday was increased to 2 g every 12, she was loaded with Vimpat, despite that her seizure has not stopped Per neurology and neurosurgery patient might need steroid therapy to reduce edema to control her seizures better  Astrocytoma s/p chemoradiation with brain edema: unclear if surrounding edema related to active seizure activity or change in  the mass Strong preference to avoid steroids due to side effects Neurologist spoke with Dr. Mickeal Skinner and PCCM team, decision was to have steroid trial, despite patient is not willing to try steroid, it will help her edema which  may control her seizures Her right-sided weakness is improving but not at the baseline yet  Insomnia: Trazodone prn  Best Practice (right click and "Reselect all SmartList Selections" daily)   Diet/type: Regular DVT prophylaxis: Prophylactic enoxaparin GI prophylaxis: N/A Lines: N/A Foley:  N/A Code Status:  full code Last date of multidisciplinary goals of care discussion [8/2] patient and her husband were updated at bedside, continue full aggressive care for now  Labs   CBC: Recent Labs  Lab 10/28/20 1116 10/29/20 0158  WBC 6.8 8.5  NEUTROABS 5.3  --   HGB 14.2 12.7  HCT 39.1 34.9*  MCV 91.6 92.1  PLT 215 [id number]    Basic Metabolic Panel: Recent Labs  Lab 10/28/20 1116 10/29/20 0158  NA 136 136  K 3.7 3.9  CL 106 108  CO2 23 19*  GLUCOSE 118* 121*  BUN 11 16  CREATININE 0.95 0.74  CALCIUM 9.2 8.8*   GFR: Estimated Creatinine Clearance: 92.3 mL/min (by C-G formula based on SCr of 0.74 mg/dL). Recent Labs  Lab 10/28/20 1116 10/29/20 0158  WBC 6.8 8.5    Liver Function Tests: Recent Labs  Lab 10/28/20 1128  AST 15  ALT 14  ALKPHOS 41  BILITOT 0.7  PROT 6.7  ALBUMIN 3.8   No results for input(s): LIPASE, AMYLASE in the last 168 hours. No results for input(s): AMMONIA in the last 168 hours.  ABG No results found for: PHART, PCO2ART, PO2ART, HCO3, TCO2, ACIDBASEDEF, O2SAT   Coagulation Profile: No results for input(s): INR, PROTIME in the last 168 hours.  Cardiac Enzymes: No results for input(s): CKTOTAL, CKMB, CKMBINDEX, TROPONINI in the last 168 hours.  HbA1C: No results found for: HGBA1C  CBG: No results for input(s): GLUCAP in the last 168 hours.   Total critical care time: 35 minutes  Performed by: [place] care time was exclusive of separately billable procedures and treating other patients.   Critical care was necessary to treat or prevent imminent or life-threatening deterioration.   Critical care was time spent  personally by me on the following activities: development of treatment plan with patient and/or surrogate as well as nursing, discussions with consultants, evaluation of patient's response to treatment, examination of patient, obtaining history from patient or surrogate, ordering and performing treatments and interventions, ordering and review of laboratory studies, ordering and review of radiographic studies, pulse oximetry and re-evaluation of patient's condition.   Jacky Kindle MD  Pulmonary Critical Care See Amion for pager If no response to pager, please call [phone] until 7pm After 7pm, Please call E-link [phone]

## 2020-10-30 NOTE — Progress Notes (Signed)
Subjective: Continues to have intermittent facial twitching.   ROS: negative except above Examination  Vital signs in last 24 hours: Temp:  [98.2 F (36.8 C)-98.8 F (37.1 C)] 98.5 F (36.9 C) (08/02 1200) Pulse Rate:  [51-86] 78 (08/02 1000) Resp:  [11-23] 15 (08/02 1000) BP: (91-117)/(59-86) 107/75 (08/02 0900) SpO2:  [96 %-100 %] 97 % (08/02 1000) Weight:  [61.3 kg] 61.3 kg (08/02 0500)  General: lying in bed, NAD CVS: pulse-normal rate and rhythm RS: breathing comfortably, CTAB Extremities: normal, warm   Neuro: MS: Alert, oriented, follows commands CN: pupils equal and reactive,  EOMI, right facial droop, tongue midline, normal sensation over face, Motor: 5/5 strength in LUE/LLE, 4+/5 in RUE proximally, 4+/5 in RUE distally, 4=/5 in RLE Coordination: FTN intact Gait: not tested  Basic Metabolic Panel: Recent Labs  Lab 10/28/20 1116 10/29/20 0158  NA 136 136  K 3.7 3.9  CL 106 108  CO2 23 19*  GLUCOSE 118* 121*  BUN 11 16  CREATININE 0.95 0.74  CALCIUM 9.2 8.8*    CBC: Recent Labs  Lab 10/28/20 1116 10/29/20 0158  WBC 6.8 8.5  NEUTROABS 5.3  --   HGB 14.2 12.7  HCT 39.1 34.9*  MCV 91.6 92.1  PLT 215 173     Coagulation Studies: No results for input(s): LABPROT, INR in the last 72 hours.  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   Epilepsy with breakthrough seizure Left temporal astrocytoma, status post chemoradiation Cerebral edema Right Todd's paralysis -LTM EEG showed continues to show seizures   Recommendations -Increased keppra to '200mg'$  BID, Start Lacosamide '100mg'$  BID. Can increase lacosamide if seizures persist -Again discussed using steroids if seizures persist.  Patient would like to avoid steroids as much as possible due to previous side effects including rash, mood disturbance.  However, if needed,  would recommend discussing with Dr. Mickeal Skinner as well as patient -Continue seizure precautions - Continue LTM EEG until patient is seizure-free for about 24 hours -As needed IV Ativan 2 mg for clinical seizure-like activity lasting more than 5 minutes -Management of rest of comorbidities per primary team   CRITICAL CARE Performed by: Lora Havens    Total critical care time: 35 minutes   Critical care time was exclusive of separately billable procedures and treating other patients.   Critical care was necessary to treat or prevent imminent or life-threatening deterioration.   Critical care was time spent personally by me on the following activities: development of treatment plan with patient and/or surrogate as well as nursing, discussions with consultants, evaluation of patient's response to treatment, examination of patient, obtaining history from patient or surrogate, ordering and performing treatments and interventions, ordering and review of laboratory studies, ordering and review of radiographic studies, pulse oximetry and re-evaluation of patient's condition.    Zeb Comfort Epilepsy [facility] For questions after 5pm please refer to AMION to reach the Neurologist on call

## 2020-10-30 NOTE — Procedures (Signed)
Patient Name: Candace [name]  MRN: [id number]  Epilepsy Attending: Lora Havens  Referring Physician/Provider: Dr Kerney Elbe Duration: 10/29/2020 1654 to 10/30/2020 1654   Patient history: 41 year old female with known left temporofrontal grade III astrocytoma s/p chemotherapy and radiotherapy. Presents with recurrence of seizure activity. EEG to evaluate for seizure   Level of alertness: Awake, asleep   AEDs during EEG study: LEV, LCM   Technical aspects: This EEG study was done with scalp electrodes positioned according to the 10-20 International system of electrode placement. Electrical activity was acquired at a sampling rate of '500Hz'$  and reviewed with a high frequency filter of '70Hz'$  and a low frequency filter of '1Hz'$ . EEG data were recorded continuously and digitally stored.   Description: The posterior dominant rhythm consists of 9-10 Hz activity of moderate voltage (25-35 uV) seen predominantly in posterior head regions, symmetric and reactive to eye opening and eye closing.  Sleep was characterized by vertex waves, sleep spindles (12 to 14 Hz), maximal frontocentral region. EEG showed continuous 3 to 6 Hz theta-delta slowing in left hemisphere, maximal left temporal region. Lateralized periodic discharges were noted in left temporal region at 0.5-'1hz'$  which at times appear rhythmic.  Focal seizures were also noted arising from left centro-temporal region. During the seizures, patient was noted to be staring offand had right facial twitching, average 1 seizure/hour, average duration of seizure about 3 minute.   Hyperventilation and photic stimulation were not performed.      ABNORMALITY - Seizure, left centro- temporal region - Lateralized periodic discharges left temporal region ( LPD+) - Continuous slow, left hemisphere, maximal left temporal region   IMPRESSION: This study showed seizures during which patient was noted to be staring off and at times had right facial twitching.  Seizures were noted to be arising from left centro-temporal region, average one seizure/hour, average duration of seizure about 3 minute. There is also evidence of epileptogenicity arising from left temporal region as well as  cortical dysfunction arising from left hemisphere, maximal left temporal region likely secondary to underlying mass. The lateralized periodic discharges at times appear rhythmic with high potential for seizure recurrence.     Gavynn Duvall [name]

## 2020-10-30 NOTE — Progress Notes (Signed)
Neuro Oncology Progress Note  Reviewed EEG data, clinical progress with patient and husband at bedside.   The etiology of inflammatory change and increased mass effect on MRI is very difficult to interpret in the context of status epilepticus.  We will need to resolve seizures and then repeat imaging once stable.  They understand the lack of complete response to dual AED therapy portends need for corticosteroids.    We will recommend, and they are in agreement with starting dexamethasone; start with '16mg'$  daily in whatever frequency desired by the primary team.    Next steps can include further AEDs, uptitrating Vimpat, starting third agent.    Will con't to follow, in communication with Dr. Hortense Ramal.  Ventura Sellers, MD

## 2020-10-31 DIAGNOSIS — R569 Unspecified convulsions: Secondary | ICD-10-CM | POA: Diagnosis not present

## 2020-10-31 DIAGNOSIS — C719 Malignant neoplasm of brain, unspecified: Secondary | ICD-10-CM | POA: Diagnosis not present

## 2020-10-31 LAB — LEVETIRACETAM LEVEL: Levetiracetam Lvl: 25.3 ug/mL (ref 10.0–40.0)

## 2020-10-31 NOTE — Procedures (Signed)
Patient Name: Candace [name]  MRN: [id number]  Epilepsy Attending: Lora Havens  Referring Physician/Provider: Dr Kerney Elbe Duration: 10/30/2020 1654 to 10/31/2020 1654   Patient history: 40 year old female with known left temporofrontal grade III astrocytoma s/p chemotherapy and radiotherapy. Presents with recurrence of seizure activity. EEG to evaluate for seizure   Level of alertness: Awake, asleep   AEDs during EEG study: LEV, LCM   Technical aspects: This EEG study was done with scalp electrodes positioned according to the 10-20 International system of electrode placement. Electrical activity was acquired at a sampling rate of '500Hz'$  and reviewed with a high frequency filter of '70Hz'$  and a low frequency filter of '1Hz'$ . EEG data were recorded continuously and digitally stored.   Description: The posterior dominant rhythm consists of 9-10 Hz activity of moderate voltage (25-35 uV) seen predominantly in posterior head regions, symmetric and reactive to eye opening and eye closing.  Sleep was characterized by vertex waves, sleep spindles (12 to 14 Hz), maximal frontocentral region. EEG showed continuous 3 to 6 Hz theta-delta slowing in left hemisphere, maximal left temporal region. Lateralized periodic discharges were noted in left temporal region at 0.5-'1hz'$  which at times appear rhythmic.  Focal seizures were also noted arising from left centro-temporal region. During the seizures, patient was noted to be staring offand had right facial twitching, average 1 seizure/hour, average duration of seizure about 3 minute. Last seizure at 2100 on 10/30/2020. After that frequencies of seizures improved. One seizure was noted on 10/31/2020 at 0636, 0730. Hyperventilation and photic stimulation were not performed.      ABNORMALITY - Seizure, left centro- temporal region - Lateralized periodic discharges left temporal region ( LPD+) - Continuous slow, left hemisphere, maximal left temporal region    IMPRESSION: This study showed seizures during which patient was noted to be staring off and at times had right facial twitching. Seizures were noted to be arising from left centro-temporal region. Initially there was average one seizure/hour, average duration of seizure about 3 minute. After 2100 on 10/30/2020, frequency of seizure improved. Seizure was then noted on 10/31/2020 at 0636, 0730.   There is also evidence of cortical dysfunction arising from left hemisphere, maximal left temporal region likely secondary to underlying mass.   Timmey Lamba [name]

## 2020-10-31 NOTE — Progress Notes (Signed)
NAME:  Candace [name], MRN:  [id number], DOB:  [birth year], LOS: 3 ADMISSION DATE:  10/28/2020, CONSULTATION DATE:  10/28/2020 REFERRING MD:  Billy Fischer, CHIEF COMPLAINT:  Seizure   History of Present Illness:  40 year old woman with known astrocytoma presented to [facility] 10/28/2020 with seizure activity for several days.    Since diagnosis and initial treatment was with radiation therapy and concurrent temodar.  Her course has been complicated by seizure. She has suffered ill effect from decadron (swelling, lack of sleep, severe mood disorder).  Her seizures were well controlled until last Friday Friday. Of note she was recently also seen in the ER in [place] this week after she had some facial twitching and had plans to see Dr. Mickeal Skinner to discuss more. Since then she has had more facial twitching and last night she had significant right arm twitching.  This persisted until this morning so her husband brought her to the ER at [facility]. Neurology plans anti-epileptic therapy and long term EEG monitoring.  She currently has slurred speech, right sided facial droop and R sided weakness.   Significant Hospital Events: Including procedures, antibiotic start and stop dates in addition to other pertinent events   7/31 admission for seizure 7/31 MRI brain > infiltrating mass with associated edema in left frontal and perietal lobes, lef insula/subinsular white matter, posterior limb of internal capsule and left temporal lobe. T2/Flair hyperinstense signal abnormality increased compared to 6/24 > could be increased edema or neoplasm, unchanged tumor cyst 3.5 cm.  Mass effect with partial effacement of left lateral ventricle and 11m rightward midline shift, slightly increased compared to 6/24 MRI. Partial effacement of basal cisterns of left and left medial temporal lobe exerting mild mass effect upon cerebral peduncle, no evidence of hydrocephalus or ventricular entrapment 8/3 Ongoing seizures on LTM EEG, Keppra  increased, Vimpat load, Dexamethasone started per Neuro-Onc.  Interim History / Subjective:  Feeling well this AM Husband at bedside Feel there is improvement in RUE movement/strenght Marginal improvement in speech/dysarthria Steroids started, advised to monitor for AEs  Objective   Blood pressure 113/77, pulse 67, temperature 98.2 F (36.8 C), temperature source Oral, resp. rate 19, height '5\' 8"'$  (1.727 m), weight 61.2 kg, SpO2 97 %.        Intake/Output Summary (Last 24 hours) at 10/31/2020 0954 Last data filed at 10/31/2020 0800 Gross per 24 hour  Intake 1539.58 ml  Output --  Net 1539.58 ml   Filed Weights   10/29/20 0200 10/30/20 0500 10/31/20 0500  Weight: 60 kg 61.3 kg 61.2 kg   Physical Examination: General: Acutely ill-appearing young female in NAD. Resting comfortably in bed. HEENT: [name], anicteric sclera, PERRL, moist mucous membranes. EEG leads in place. Neuro: Awake, oriented x 4. Responds to verbal stimuli. Following commands consistently. Moves all 4 extremities spontaneously. Mild dysarthria noted, slow to respond. CV: RRR, no m/g/r. PULM: Breathing even and unlabored on RA. Lung fields CTAB. GI: Soft, nontender, nondistended. Normoactive bowel sounds. Extremities: No LE edema noted. Skin: Warm/dry, no rashes.  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 YHortense Ramal and Dr. VMickeal Skinner(Neuro-Onc) managing - LTM EEG with ongoing seizure activity - Keppra, Vimpat dosing per Neuro - Ativan PRN as abortive agent - May need to add third antiepileptic agent - Dexamethasone started 8/2, '8mg'$  BID - Remain in ICU due to seizure frequency, could consider progressive if frequency decreases/remains  stable from a neurologic perspective  Astrocytoma s/p chemoradiation with brain edema: unclear if surrounding edema related to active seizure  activity or change in the mass Managed by Dr. Mickeal Skinner (Neuro-Onc). S/p radiation and Temodar. MRI Brain 7/31 demonstrated large infiltrating mass with edema in the L frontal/parietal lobes, L insular white matter, L internal capsue and L temporal lobe; increased T2/FLAIR and tumor cyst measuring 3.5cm (unchanged); mass effect. - Management per Dr. Mickeal Skinner - Continue steroids as above - Patient is having slow improvent in RUE movement/strength, marginal improvement in dysarthria  Insomnia - Trazodone PRN  Best Practice (right click and "Reselect all SmartList Selections" daily)   Diet/type: Regular DVT prophylaxis: Prophylactic enoxaparin GI prophylaxis: N/A Lines: N/A Foley:  N/A Code Status:  full code Last date of multidisciplinary goals of care discussion [8/2] patient and her husband were updated at bedside, continue full aggressive care for now  Critical care time: N/A   Rhae Lerner  Pulmonary & Critical Care 10/31/20 9:54 AM  Please see [email or web address] for pager details.  From 7A-7P if no response, please call [phone] After hours, please call ELink [phone]

## 2020-10-31 NOTE — Progress Notes (Signed)
Subjective: had one focal seizure this morning. States she is feeling better.   ROS: negative except above  Examination  Vital signs in last 24 hours: Temp:  [98.2 F (36.8 C)-98.8 F (37.1 C)] 98.3 F (36.8 C) (08/03 1200) Pulse Rate:  [49-80] 65 (08/03 1200) Resp:  [12-29] 21 (08/03 1200) BP: (88-121)/(55-86) 120/86 (08/03 1200) SpO2:  [93 %-100 %] 98 % (08/03 1200) Weight:  [61.2 kg] 61.2 kg (08/03 0500)  General: lying in bed, NAD CVS: pulse-normal rate and rhythm RS: breathing comfortably, CTAB Extremities: normal, warm   Neuro: MS: Alert, oriented, follows commands CN: pupils equal and reactive,  EOMI, right facial droop, tongue midline, normal sensation over face, Motor: 5/5 strength in LUE/LLE, 4+/5 in RUE proximally, 4+/5 in RUE distally, 4+/5 in RLE Coordination: FTN intact Gait: not tested  Basic Metabolic Panel: Recent Labs  Lab 10/28/20 1116 10/29/20 0158  NA 136 136  K 3.7 3.9  CL 106 108  CO2 23 19*  GLUCOSE 118* 121*  BUN 11 16  CREATININE 0.95 0.74  CALCIUM 9.2 8.8*    CBC: Recent Labs  Lab 10/28/20 1116 10/29/20 0158  WBC 6.8 8.5  NEUTROABS 5.3  --   HGB 14.2 12.7  HCT 39.1 34.9*  MCV 91.6 92.1  PLT 215 173     Coagulation Studies: No results for input(s): LABPROT, INR in the last 72 hours.  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   Epilepsy with breakthrough seizure Left temporal astrocytoma, status post chemoradiation Cerebral edema Right Todd's paralysis -seizure frequency has improved since starting dexamethasone   Recommendations -Continue keppra '200mg'$  BID, Lacosamide '100mg'$  BID. Can increase lacosamide if seizures persist -Continue dexamethasone '8mg'$  BID -Continue seizure precautions - Continue LTM EEG until patient is seizure-free for about 24 hours -As needed IV Ativan 2 mg for  clinical seizure-like activity lasting more than 5 minutes -Management of rest of comorbidities per primary team  I have spent a total of  35 minutes with the patient reviewing hospital notes,  test results, labs and examining the patient as well as establishing an assessment and plan that was discussed personally with the patient, her husband, RN and Dr Tacy Learn.  > 50% of time was spent in direct patient care.     Zeb Comfort Epilepsy [facility] For questions after 5pm please refer to AMION to reach the Neurologist on call

## 2020-11-01 ENCOUNTER — Other Ambulatory Visit (HOSPITAL_COMMUNITY): Payer: Self-pay

## 2020-11-01 ENCOUNTER — Encounter: Payer: Self-pay | Admitting: Internal Medicine

## 2020-11-01 ENCOUNTER — Other Ambulatory Visit: Payer: Self-pay | Admitting: *Deleted

## 2020-11-01 MED ORDER — FAMOTIDINE 20 MG PO TABS
20.0000 mg | ORAL_TABLET | Freq: Two times a day (BID) | ORAL | Status: DC
  Administered 2020-11-01: 20 mg via ORAL
  Filled 2020-11-01: qty 1

## 2020-11-01 MED ORDER — LACOSAMIDE 50 MG PO TABS
150.0000 mg | ORAL_TABLET | Freq: Two times a day (BID) | ORAL | Status: DC
  Administered 2020-11-01: 150 mg via ORAL
  Filled 2020-11-01: qty 3

## 2020-11-01 MED ORDER — LACOSAMIDE 50 MG PO TABS
150.0000 mg | ORAL_TABLET | Freq: Two times a day (BID) | ORAL | 0 refills | Status: DC
  Filled 2020-11-01: qty 6, 1d supply, fill #0

## 2020-11-01 MED ORDER — LEVETIRACETAM 500 MG PO TABS: 2000.0000 mg | ORAL_TABLET | Freq: Two times a day (BID) | ORAL | 0 refills | Status: DC

## 2020-11-01 MED ORDER — LACOSAMIDE 150 MG PO TABS: 150.0000 mg | ORAL_TABLET | Freq: Two times a day (BID) | ORAL | 0 refills | Status: DC

## 2020-11-01 MED ORDER — FAMOTIDINE 20 MG PO TABS: 20.0000 mg | ORAL_TABLET | Freq: Every day | ORAL | 0 refills | Status: DC

## 2020-11-01 MED ORDER — DEXAMETHASONE 4 MG PO TABS: 8.0000 mg | ORAL_TABLET | Freq: Two times a day (BID) | ORAL | 0 refills | Status: DC

## 2020-11-01 NOTE — Progress Notes (Signed)
CSW completed Vimpat registration online and provided print out of savings card to patient and her spouse at bedside.   Gilmore Laroche, MSW, [facility]

## 2020-11-01 NOTE — Procedures (Addendum)
Patient Name: Candace [name]  MRN: [id number]  Epilepsy Attending: Lora Havens  Referring Physician/Provider: Dr Kerney Elbe Duration: 10/31/2020 1654 to 11/01/2020 1143   Patient history: 41 year old female with known left temporofrontal grade III astrocytoma s/p chemotherapy and radiotherapy. Presents with recurrence of seizure activity. EEG to evaluate for seizure   Level of alertness: Awake, asleep   AEDs during EEG study: LEV, LCM   Technical aspects: This EEG study was done with scalp electrodes positioned according to the 10-20 International system of electrode placement. Electrical activity was acquired at a sampling rate of '500Hz'$  and reviewed with a high frequency filter of '70Hz'$  and a low frequency filter of '1Hz'$ . EEG data were recorded continuously and digitally stored.   Description: The posterior dominant rhythm consists of 9-10 Hz activity of moderate voltage (25-35 uV) seen predominantly in posterior head regions, symmetric and reactive to eye opening and eye closing.  Sleep was characterized by vertex waves, sleep spindles (12 to 14 Hz), maximal frontocentral region. EEG showed continuous 3 to 6 Hz theta-delta slowing in left hemisphere, maximal left temporal region. Lateralized periodic discharges were noted in left temporal region at 0.5-'1hz'$  which at times appear rhythmic.  Focal seizures were also noted arising from left centro-temporal region on 10/31/2020 at 1700 (no clinical signs), 1910 (no clinical signs) and 2035 (right facial twitching) average duration of seizure about 3 minute.    ABNORMALITY - Seizure,  left centro- temporal region - Lateralized periodic discharges left temporal region ( LPD+) - Continuous slow, left hemisphere, maximal left temporal region   IMPRESSION: This study showed seizures arising from left centro-temporal region, on 10/31/2020.  No clinical signs were seen during seizures at 1700, 1910 and patient had right facial twitching at 2035,  average  duration of seizure about 3 minute.    There is also evidence of cortical dysfunction arising from left hemisphere, maximal left temporal region likely secondary to underlying mass.   Candace [name]

## 2020-11-01 NOTE — Discharge Summary (Signed)
Physician Discharge Summary       Patient ID: Candace [name] MRN: [id number] DOB/AGE: [birth year] 40 y.o.  Admission Date: 10/28/2020 Discharge Date: 11/01/2020  Discharge Diagnoses:  Active Problems:   Seizure ([place])  History of Present Illness/Hospital Course: 40 year old woman with known astrocytoma presented to [facility] 10/28/2020 with seizure activity for several days.     Since diagnosis and initial treatment was with radiation therapy and concurrent temodar.  Her course has been complicated by seizure. She has suffered ill effect from decadron (swelling, lack of sleep, severe mood disorder).  Her seizures were well controlled until the Friday prior to admission. Of note, patient was recently seen in the ER in [place] this week after she had some facial twitching and had plans to see Dr. Mickeal Skinner to discuss more. Since then she has had more facial twitching and significant right arm twitching.  This persisted until this morning so her husband brought her to the ER at [facility]. Neurology and Neuro-Onc were consulted on admission with plan for anti-epileptic therapy and long term EEG monitoring.    Throughout hospital course, patient's presenting symptoms of slurred speech, right sided facial droop and R sided weakness continued to slowly improve. Seizure frequency (both clinical seizure noted by patient and subclinical seizure on LTM EEG) continues to decrease with AED dose adjustment and steroid initiation.  Given clinical improvement and improved stability/frequency of seizures with AED adjustments, patient was deemed stable for discharge home with close follow-up. She has scheduled f/u with Dr. Mickeal Skinner 11/27/2020.  Discharge Plan by Active Problems:  Recurrent seizures due to underlying astrocytoma and edema with associated right-sided Todd's paralysis - Epilepsy team (Dr. Hortense Ramal) and Dr. Mickeal Skinner (Neuro-Onc) managing - LTM EEG with significantly decreased seizure frequency post-steroid  initiation - Plan to continue LTM EEG until patient seizure-free x 24H - Continue Keppra '2000mg'$  BID - Continue Vimpat '150mg'$  BID (per Neuro, can increase to '200mg'$  BID if seizure frequency increases) - Continue dexamethasone '8mg'$  BID - Continue Pepcid '20mg'$  daily for stress ulcer prophylaxis - Stable for discharge home with seizure precautions   Astrocytoma s/p chemoradiation with brain edema: unclear if surrounding edema related to active seizure activity or change in the mass Managed by Dr. Mickeal Skinner (Neuro-Onc). S/p radiation and Temodar. MRI Brain 7/31 demonstrated large infiltrating mass with edema in the L frontal/parietal lobes, L insular white matter, L internal capsue and L temporal lobe; increased T2/FLAIR and tumor cyst measuring 3.5cm (unchanged); mass effect. - Management per Dr. Mickeal Skinner - Steroids as above - Follow up with Dr. Mickeal Skinner scheduled for 11/27/2020 at 12:00PM  [facility] Tests/Studies: 7/31 MRI brain > infiltrating mass with associated edema in left frontal and perietal lobes, lef insula/subinsular white matter, posterior limb of internal capsule and left temporal lobe. T2/Flair hyperinstense signal abnormality increased compared to 6/24 > could be increased edema or neoplasm, unchanged tumor cyst 3.5 cm.  Mass effect with partial effacement of left lateral ventricle and 56m rightward midline shift, slightly increased compared to 6/24 MRI. Partial effacement of basal cisterns of left and left medial temporal lobe exerting mild mass effect upon cerebral peduncle, no evidence of hydrocephalus or ventricular entrapment 8/2 Ongoing seizures on LTM EEG, Keppra increased, Vimpat load, Dexamethasone started per Neuro-Onc. 8/3 Decreased frequency of seizures with dexamethasone start. Improving RUE strength. Ongoing dysarthria. One episode ~2230 during which patient was aware of R-sided tongue/facial twitching.  Consults: Neuro/Epilepsy, Neuro-Oncology  Discharge Exam: BP 97/73    Pulse 70   Temp 97.8 F (36.6  C) (Oral)   Resp 14   Ht '5\' 8"'$  (1.727 m)   Wt 61.2 kg   SpO2 100%   BMI 20.51 kg/m   General: Acutely ill-appearing young woman in NAD. HEENT: [place]/AT, anicteric sclera, PERRL, moist mucous membranes. Neuro: Awake, oriented x 4. Responds to verbal stimuli. Following commands consistently. Moves all 4 extremities spontaneously. RUE weakness improving. Ongoing dysarthria with slow response time, slowly improving. CV: RRR, no m/g/r. PULM: Breathing even and unlabored on RA. Lung fields CTAB. GI: Soft, nontender, nondistended. Normoactive bowel sounds. Extremities: No LE edema noted. Skin: Warm/dry, no rashes.  Labs at Discharge: Lab Results  Component Value Date   CREATININE 0.74 10/29/2020   BUN 16 10/29/2020   NA 136 10/29/2020   K 3.9 10/29/2020   CL 108 10/29/2020   CO2 19 (L) 10/29/2020   Lab Results  Component Value Date   WBC 8.5 10/29/2020   HGB 12.7 10/29/2020   HCT 34.9 (L) 10/29/2020   MCV 92.1 10/29/2020   PLT 173 10/29/2020   Lab Results  Component Value Date   ALT 14 10/28/2020   AST 15 10/28/2020   ALKPHOS 41 10/28/2020   BILITOT 0.7 10/28/2020   Lab Results  Component Value Date   INR 1.2 12/08/2019   INR 1.2 12/07/2019    Current Radiology Studies: No results found.  Disposition:  Discharge disposition: 01-Home or Self Care      Discharge Instructions     Call MD for:   Complete by: As directed    Increased focal seizure activity, new headache, new seizure-like symptoms, or any change in neurologic status   Call MD for:  difficulty breathing, headache or visual disturbances   Complete by: As directed    Call MD for:  extreme fatigue   Complete by: As directed    Call MD for:  hives   Complete by: As directed    Call MD for:  persistant dizziness or light-headedness   Complete by: As directed    Call MD for:  persistant nausea and vomiting   Complete by: As directed    Call MD for:  redness,  tenderness, or signs of infection (pain, swelling, redness, odor or green/yellow discharge around incision site)   Complete by: As directed    Call MD for:  severe uncontrolled pain   Complete by: As directed    Call MD for:  temperature >100.4   Complete by: As directed    Diet - low sodium heart healthy   Complete by: As directed    Discharge instructions   Complete by: As directed    Recommendations -Continue Keppra '2000mg'$  BID, Lacosamide to '150mg'$  BID.  - If patient further seizures, she should notify Dr. Mickeal Skinner.  At that point, Vimpat can likely be increased to '200mg'$  twice daily.  If seizures persist, Onfi can be added.  However, if seizures become more frequent or longer lasting than about 15 minutes, she should return to ER. - Continue dexamethasone '8mg'$  BID - Follow-up with Dr. Mickeal Skinner on 11/27/2020 at noon   Seizure precautions: Per [employer] statutes, patients with seizures are not allowed to drive until they have been seizure-free for six months and cleared by a physician   Use caution when using heavy equipment or power tools. Avoid working on ladders or at heights. Take showers instead of baths. Ensure the water temperature is not too high on the home water heater. Do not go swimming alone. Do not lock yourself in  a room alone (i.e. bathroom). When caring for infants or small children, sit down when holding, feeding, or changing them to minimize risk of injury to the child in the event you have a seizure. Maintain good sleep hygiene. Avoid alcohol.   If patient has another seizure, call 911 and bring them back to the ED if: A.  The seizure lasts longer than 5 minutes.      B.  The patient doesn't wake shortly after the seizure or has new problems such as difficulty seeing, speaking or moving following the seizure C.  The patient was injured during the seizure D.  The patient has a temperature over 102 F (39C) E.  The patient vomited during the seizure and now is having  trouble breathing    During the Seizure   - First, ensure adequate ventilation and place patients on the floor on their left side Loosen clothing around the neck and ensure the airway is patent. If the patient is clenching the teeth, do not force the mouth open with any object as this can cause severe damage - Remove all items from the surrounding that can be hazardous. The patient may be oblivious to what's happening and may not even know what he or she is doing. If the patient is confused and wandering, either gently guide him/her away and block access to outside areas - Reassure the individual and be comforting - Call 911. In most cases, the seizure ends before EMS arrives. However, there are cases when seizures may last over 3 to 5 minutes. Or the individual may have developed breathing difficulties or severe injuries. If a pregnant patient or a person with diabetes develops a seizure, it is prudent to call an ambulance. - Finally, if the patient does not regain full consciousness, then call EMS. Most patients will remain confused for about 45 to 90 minutes after a seizure, so you must use judgment in calling for help. - Avoid restraints but make sure the patient is in a bed with padded side rails - Place the individual in a lateral position with the neck slightly flexed; this will help the saliva drain from the mouth and prevent the tongue from falling backward - Remove all nearby furniture and other hazards from the area - Provide verbal assurance as the individual is regaining consciousness - Provide the patient with privacy if possible - Call for help and start treatment as ordered by the caregiver    After the Seizure (Postictal Stage)   After a seizure, most patients experience confusion, fatigue, muscle pain and/or a headache. Thus, one should permit the individual to sleep. For the next few days, reassurance is essential. Being calm and helping reorient the person is also of  importance.   Most seizures are painless and end spontaneously. Seizures are not harmful to others but can lead to complications such as stress on the lungs, brain and the heart. Individuals with prior lung problems may develop labored breathing and respiratory distress.   Increase activity slowly   Complete by: As directed       Allergies as of 11/01/2020       Reactions   Penicillins Rash        Medication List     STOP taking these medications    traZODone 50 MG tablet Commonly known as: DESYREL       TAKE these medications    dexamethasone 4 MG tablet Commonly known as: DECADRON Take 2 tablets (8 mg total) by mouth  every 12 (twelve) hours.   famotidine 20 MG tablet Commonly known as: PEPCID Take 1 tablet (20 mg total) by mouth daily.   GENISTEIN PO Take 1 tablet by mouth at bedtime.   ibuprofen 200 MG tablet Commonly known as: ADVIL Take 600 mg by mouth every 6 (six) hours as needed for headache.   Lacosamide 150 MG Tabs Take 1 tablet (150 mg total) by mouth 2 (two) times daily.   levETIRAcetam 500 MG tablet Commonly known as: KEPPRA Take 4 tablets (2,000 mg total) by mouth 2 (two) times daily. What changed: how much to take   LYCOPENE PO Take 1 capsule by mouth daily.   MELATONIN PO Take 1 tablet by mouth at bedtime.   MILK THISTLE EXTRACT PO Take 1 capsule by mouth at bedtime. Silibinin   OVER THE COUNTER MEDICATION Take 1 capsule by mouth daily. [place] mushroom   OVER THE COUNTER MEDICATION Take 1 tablet by mouth daily. Berberine   OVER THE COUNTER MEDICATION Take 1 capsule by mouth daily. Frankincense   RESVERATROL PO Take 1 capsule by mouth at bedtime.   TURMERIC PO Take 1 tablet by mouth daily.       Discharged Condition:  Good  45 minutes of time have been dedicated to discharge assessment, planning and discharge instructions.   Signed:  Rhae Lerner [facility] 11/01/20 1:58 PM  Please  see [email or web address] for pager details.  From 7A-7P if no response, please call [phone] After hours, please call ELink [phone]

## 2020-11-01 NOTE — Progress Notes (Signed)
LTM EEG discontinued - patient had slight unblanchable redness at fp2, f8, f8, f4

## 2020-11-01 NOTE — Progress Notes (Signed)
NAME:  Candace [name], MRN:  [id number], DOB:  12-Dec-1980, LOS: 4 ADMISSION DATE:  10/28/2020, CONSULTATION DATE:  10/28/2020 REFERRING MD:  Billy Fischer, CHIEF COMPLAINT:  Seizure   History of Present Illness:  40 year old woman with known astrocytoma presented to [facility] 10/28/2020 with seizure activity for several days.    Since diagnosis and initial treatment was with radiation therapy and concurrent temodar.  Her course has been complicated by seizure. She has suffered ill effect from decadron (swelling, lack of sleep, severe mood disorder).  Her seizures were well controlled until last Friday. Of note she was recently also seen in the ER in [place] this week after she had some facial twitching and had plans to see Dr. Mickeal Skinner to discuss more. Since then she has had more facial twitching and last night she had significant right arm twitching.  This persisted until this morning so her husband brought her to the ER at [facility]. Neurology plans anti-epileptic therapy and long term EEG monitoring.  She currently has slurred speech, right sided facial droop and R sided weakness, which is slowly improving.  Significant Hospital Events: Including procedures, antibiotic start and stop dates in addition to other pertinent events   7/31 admission for seizure 7/31 MRI brain > infiltrating mass with associated edema in left frontal and perietal lobes, lef insula/subinsular white matter, posterior limb of internal capsule and left temporal lobe. T2/Flair hyperinstense signal abnormality increased compared to 6/24 > could be increased edema or neoplasm, unchanged tumor cyst 3.5 cm.  Mass effect with partial effacement of left lateral ventricle and 80m rightward midline shift, slightly increased compared to 6/24 MRI. Partial effacement of basal cisterns of left and left medial temporal lobe exerting mild mass effect upon cerebral peduncle, no evidence of hydrocephalus or ventricular entrapment 8/2 Ongoing seizures  on LTM EEG, Keppra increased, Vimpat load, Dexamethasone started per Neuro-Onc. 8/3 Decreased frequency of seizures with dexamethasone start. Improving RUE strength. Ongoing dysarthria. One episode ~2230 during which patient was aware of R-sided tongue/facial twitching.  Interim History / Subjective:  Feeling better overall today One episode overnight ~2230 during which patient was aware of tongue/R facial twitching No other episodes noted by patient Feels a few EEG leads may be loose/not in place Slow improvement in RUE weakness  Objective   Blood pressure 99/76, pulse 69, temperature (!) 97.4 F (36.3 C), temperature source Oral, resp. rate 15, height '5\' 8"'$  (1.727 m), weight 61.2 kg, SpO2 99 %.        Intake/Output Summary (Last 24 hours) at 11/01/2020 0751 Last data filed at 10/31/2020 2000 Gross per 24 hour  Intake 980 ml  Output --  Net 980 ml    Filed Weights   10/29/20 0200 10/30/20 0500 10/31/20 0500  Weight: 60 kg 61.3 kg 61.2 kg   Physical Examination: General: Acutely ill-appearing young woman in NAD. HEENT: [place]/AT, anicteric sclera, PERRL, moist mucous membranes. EEG leads in place. Neuro: Awake, oriented x 4. Responds to verbal stimuli. Following commands consistently. Moves all 4 extremities spontaneously. RUE weakness improving. Ongoing dysarthria with slow response time, slowly improving. CV: RRR, no m/g/r. PULM: Breathing even and unlabored on RA. Lung fields CTAB. GI: Soft, nontender, nondistended. Normoactive bowel sounds. Extremities: No LE edema noted. Skin: Warm/dry, no rashes.  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 YHortense Ramal and Dr. VMickeal Skinner(Neuro-Onc) managing - LTM EEG with  significantly decreased seizure frequency post-steroid initiation - Plan to continue LTM EEG until patient seizure-free x 24H - Continue  Keppra, Vimpat per Neuro (room to increase Vimpat if necessary) - Ativan PRN as abortive agent - Continue dexamethasone '8mg'$  BID - Consider transfer to progressive unit, given decreased frequency of seizures; will defer to Neuro team  Astrocytoma s/p chemoradiation with brain edema: unclear if surrounding edema related to active seizure activity or change in the mass Managed by Dr. Mickeal Skinner (Neuro-Onc). S/p radiation and Temodar. MRI Brain 7/31 demonstrated large infiltrating mass with edema in the L frontal/parietal lobes, L insular white matter, L internal capsue and L temporal lobe; increased T2/FLAIR and tumor cyst measuring 3.5cm (unchanged); mass effect. - Management per Dr. Mickeal Skinner - Steroids as above  Insomnia - Trazodone PRN  Best Practice (right click and "Reselect all SmartList Selections" daily)   Diet/type: Regular DVT prophylaxis: Prophylactic enoxaparin GI prophylaxis: N/A Lines: N/A Foley:  N/A Code Status:  full code Last date of multidisciplinary goals of care discussion [8/2] patient and her husband were updated at bedside, continue full aggressive care for now  Critical care time: N/A   Rhae [facility]ner [facility] 11/01/20 7:51 AM  Please see [email or web address] for pager details.  From 7A-7P if no response, please call [phone] After hours, please call ELink [phone]

## 2020-11-01 NOTE — Progress Notes (Signed)
Subjective: Reports having 1 clinical seizure last night during which she felt right facial twitching.  No other concerns.  ROS: negative except above  Examination  Vital signs in last 24 hours: Temp:  [97.4 F (36.3 C)-98.3 F (36.8 C)] 97.8 F (36.6 C) (08/04 0715) Pulse Rate:  [53-79] 69 (08/04 0715) Resp:  [13-22] 15 (08/04 0715) BP: (88-127)/(56-86) 99/76 (08/04 0715) SpO2:  [95 %-100 %] 99 % (08/04 0715)  General: lying in bed, NAD CVS: pulse-normal rate and rhythm RS: breathing comfortably, CTAB Extremities: normal, warm   Neuro: MS: Alert, oriented, follows commands CN: pupils equal and reactive,  EOMI, right facial droop, tongue midline, normal sensation over face, Motor: 5/5 strength in LUE/LLE, 4+/5 in RUE proximally, 4+/5 in RUE distally, 4=/5 in RLE Coordination: FTN intact Gait: not tested  Basic Metabolic Panel: Recent Labs  Lab 10/28/20 1116 10/29/20 0158  NA 136 136  K 3.7 3.9  CL 106 108  CO2 23 19*  GLUCOSE 118* 121*  BUN 11 16  CREATININE 0.95 0.74  CALCIUM 9.2 8.8*    CBC: Recent Labs  Lab 10/28/20 1116 10/29/20 0158  WBC 6.8 8.5  NEUTROABS 5.3  --   HGB 14.2 12.7  HCT 39.1 34.9*  MCV 91.6 92.1  PLT 215 173     Coagulation Studies: No results for input(s): LABPROT, INR in the last 72 hours.  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   Epilepsy with breakthrough seizure Left temporal astrocytoma, status post chemoradiation Cerebral edema Right Todd's paralysis -seizure frequency has improved since starting dexamethasone.  Had 2 subclinical seizures overnight and 1 focal motor seizure.    Recommendations -Continue keppra '200mg'$  BID, Increase Lacosamide to '150mg'$  BID.  -Discussed with patient that she has not had any seizures in over 12 hours and she is currently interested in  going home.  I discussed that if she has any further seizures, she should notify Dr. Mickeal Skinner.  At that point her Vimpat can likely be increased to 200 mg twice daily.  If seizures persist, Onfi can be added.  However, if seizures become more frequent or longer lasting than about 15 minutes, she should return to ER. -Continue dexamethasone '8mg'$  BID -Continue seizure precautions -Discontinue LTM EEG -Follow-up with Dr. Mickeal Skinner on 11/27/2020 at noon  Seizure precautions: Per [facility] statutes, patients with seizures are not allowed to drive until they have been seizure-free for six months and cleared by a physician    Use caution when using heavy equipment or power tools. Avoid working on ladders or at heights. Take showers instead of baths. Ensure the water temperature is not too high on the home water heater. Do not go swimming alone. Do not lock yourself in a room alone (i.e. bathroom). When caring for infants or small children, sit down when holding, feeding, or changing them to minimize risk of injury to the child in the event you have a seizure. Maintain good sleep hygiene. Avoid alcohol.    If patient has another seizure, call 911 and bring them back to the ED if: A.  The seizure lasts longer than 5 minutes.      B.  The patient doesn't wake shortly after the seizure or has new problems such as difficulty seeing, speaking or moving following the seizure C.  The patient was injured during the seizure D.  The patient  has a temperature over 102 F (39C) E.  The patient vomited during the seizure and now is having trouble breathing    During the Seizure   - First, ensure adequate ventilation and place patients on the floor on their left side  Loosen clothing around the neck and ensure the airway is patent. If the patient is clenching the teeth, do not force the mouth open with any object as this can cause severe damage - Remove all items from the surrounding that can be hazardous. The  patient may be oblivious to what's happening and may not even know what he or she is doing. If the patient is confused and wandering, either gently guide him/her away and block access to outside areas - Reassure the individual and be comforting - Call 911. In most cases, the seizure ends before EMS arrives. However, there are cases when seizures may last over 3 to 5 minutes. Or the individual may have developed breathing difficulties or severe injuries. If a pregnant patient or a person with diabetes develops a seizure, it is prudent to call an ambulance. - Finally, if the patient does not regain full consciousness, then call EMS. Most patients will remain confused for about 45 to 90 minutes after a seizure, so you must use judgment in calling for help. - Avoid restraints but make sure the patient is in a bed with padded side rails - Place the individual in a lateral position with the neck slightly flexed; this will help the saliva drain from the mouth and prevent the tongue from falling backward - Remove all nearby furniture and other hazards from the area - Provide verbal assurance as the individual is regaining consciousness - Provide the patient with privacy if possible - Call for help and start treatment as ordered by the caregiver    After the Seizure (Postictal Stage)   After a seizure, most patients experience confusion, fatigue, muscle pain and/or a headache. Thus, one should permit the individual to sleep. For the next few days, reassurance is essential. Being calm and helping reorient the person is also of importance.   Most seizures are painless and end spontaneously. Seizures are not harmful to others but can lead to complications such as stress on the lungs, brain and the heart. Individuals with prior lung problems may develop labored breathing and respiratory distress.    I have spent a total of  35 minutes with the patient reviewing hospital notes,  test results, labs and examining  the patient as well as establishing an assessment and plan that was discussed personally with the patient, her husband, RN and Dr Tacy Learn.  > 50% of time was spent in direct patient care.   Zeb Comfort Epile[facility] For questions after 5pm please refer to AMION to reach the Neurologist on call

## 2020-11-05 ENCOUNTER — Other Ambulatory Visit: Payer: Self-pay

## 2020-11-05 ENCOUNTER — Inpatient Hospital Stay: Payer: BC Managed Care – PPO | Attending: Internal Medicine | Admitting: Internal Medicine

## 2020-11-05 VITALS — BP 115/73 | HR 80 | Temp 98.9°F | Resp 18 | Wt 132.5 lb

## 2020-11-05 DIAGNOSIS — Z7952 Long term (current) use of systemic steroids: Secondary | ICD-10-CM | POA: Diagnosis not present

## 2020-11-05 DIAGNOSIS — R569 Unspecified convulsions: Secondary | ICD-10-CM | POA: Insufficient documentation

## 2020-11-05 DIAGNOSIS — R4781 Slurred speech: Secondary | ICD-10-CM | POA: Insufficient documentation

## 2020-11-05 DIAGNOSIS — Z9221 Personal history of antineoplastic chemotherapy: Secondary | ICD-10-CM | POA: Diagnosis not present

## 2020-11-05 DIAGNOSIS — Z808 Family history of malignant neoplasm of other organs or systems: Secondary | ICD-10-CM | POA: Diagnosis not present

## 2020-11-05 DIAGNOSIS — Z79899 Other long term (current) drug therapy: Secondary | ICD-10-CM | POA: Diagnosis not present

## 2020-11-05 DIAGNOSIS — R200 Anesthesia of skin: Secondary | ICD-10-CM | POA: Insufficient documentation

## 2020-11-05 DIAGNOSIS — Z8249 Family history of ischemic heart disease and other diseases of the circulatory system: Secondary | ICD-10-CM | POA: Diagnosis not present

## 2020-11-05 DIAGNOSIS — Z880 Allergy status to penicillin: Secondary | ICD-10-CM | POA: Insufficient documentation

## 2020-11-05 DIAGNOSIS — Z8043 Family history of malignant neoplasm of testis: Secondary | ICD-10-CM | POA: Diagnosis not present

## 2020-11-05 DIAGNOSIS — R55 Syncope and collapse: Secondary | ICD-10-CM | POA: Insufficient documentation

## 2020-11-05 DIAGNOSIS — C711 Malignant neoplasm of frontal lobe: Secondary | ICD-10-CM | POA: Diagnosis present

## 2020-11-05 DIAGNOSIS — F419 Anxiety disorder, unspecified: Secondary | ICD-10-CM | POA: Insufficient documentation

## 2020-11-05 DIAGNOSIS — R269 Unspecified abnormalities of gait and mobility: Secondary | ICD-10-CM | POA: Diagnosis not present

## 2020-11-05 DIAGNOSIS — R253 Fasciculation: Secondary | ICD-10-CM | POA: Insufficient documentation

## 2020-11-05 DIAGNOSIS — R609 Edema, unspecified: Secondary | ICD-10-CM | POA: Diagnosis not present

## 2020-11-05 MED ORDER — DEXAMETHASONE 4 MG PO TABS: 4.0000 mg | ORAL_TABLET | Freq: Two times a day (BID) | ORAL | 0 refills | Status: AC

## 2020-11-05 NOTE — Progress Notes (Signed)
[facility] [place],  [address] [phone]   Interval Evaluation  Date of Service: 11/05/20 Patient Name: Candace [name] Patient MRN: [id number] Patient DOB: 1980/09/03 Provider: Ventura Sellers, MD  Identifying Statement:  Candace [name] is a 40 y.o. female with left temporal  grade 3 astrocytoma     Oncologic History: Oncology History  Astrocytoma of frontal lobe ([place])  12/08/2019 Surgery   Stereotactic R temporal biopsy by Dr. Marcello Moores.  Path demonstrates Astrocytoma, IDH-mt, WHO grade 3   01/17/2020 - 03/02/2020 Radiation Therapy   IMRT and concurrent Temodar with Dr. Isidore Moos     Biomarkers:  MGMT Unknown.  IDH 1/2 Mutated.  EGFR Unknown  TERT Unknown   Interval History:  Candace [name] presents today for follow up after hospitalization for status epilepticus.  She describes no additional clinical seizures since discharge last week, currently dosing Keppra 2062m BID and Vimpat 1542mBID, in addition to decadron 27m103mwice per day.  Right hand is still weak and clumsy, not at prior baseline, though improved since discharge from [facility].  Her language impairments are also increased from her usual baseline, but better than when seizing or upon discharge.  She is walking independently with minimal dragging of the right leg.  H+P (12/23/19) Patient presented to medical attention early in September with first ever seizure.  Event was described as "sudden onset left arm numbness" followed by "face twisting, and loss of consciousness for at least 30 minutes".  CNS imaging demonstrated a large left frontal non enhancing mass consistent with primary brain tumor.  She underwent biopsy on 12/08/19 with Dr. ThoMarcello MooresFollowing surgery she has some slurring of speech but no other significant complaints, returned to prior baseline upon discharge.  Now home, she has had no breathrough events and no focal neurologic complaints.     Medications: Current Outpatient Medications on File Prior to Visit  Medication Sig Dispense Refill   dexamethasone (DECADRON) 4 MG tablet Take 2 tablets (8 mg total) by mouth every 12 (twelve) hours. 120 tablet 0   lacosamide 150 MG TABS Take 1 tablet (150 mg total) by mouth 2 (two) times daily. 60 tablet 0   levETIRAcetam (KEPPRA) 500 MG tablet Take 4 tablets (2,000 mg total) by mouth 2 (two) times daily. 240 tablet 0   LYCOPENE PO Take 1 capsule by mouth daily.     MELATONIN PO Take 1 tablet by mouth at bedtime.     MILK THISTLE EXTRACT PO Take 1 capsule by mouth at bedtime. Silibinin     OVER THE COUNTER MEDICATION Take 1 capsule by mouth daily. [place] mushroom     OVER THE COUNTER MEDICATION Take 1 tablet by mouth daily. Berberine     OVER THE COUNTER MEDICATION Take 1 capsule by mouth daily. Frankincense     RESVERATROL PO Take 1 capsule by mouth at bedtime.     TURMERIC PO Take 1 tablet by mouth daily.     famotidine (PEPCID) 20 MG tablet Take 1 tablet (20 mg total) by mouth daily. (Patient not taking: Reported on 11/05/2020) 30 tablet 0   GENISTEIN PO Take 1 tablet by mouth at bedtime.     ibuprofen (ADVIL) 200 MG tablet Take 600 mg by mouth every 6 (six) hours as needed for headache. (Patient not taking: Reported on 11/05/2020)     lacosamide (VIMPAT) 50 MG TABS tablet Take 3 tablets (150 mg total) by mouth in the morning and  at bedtime. (Patient not taking: Reported on 11/05/2020) 6 tablet 0   No current facility-administered medications on file prior to visit.    Allergies:  Allergies  Allergen Reactions   Trazodone And Nefazodone Itching   Penicillins Rash   Past Medical History:  Past Medical History:  Diagnosis Date   Brain tumor [facility])    Headache    Past Surgical History:  Past Surgical History:  Procedure Laterality Date   ABDOMINAL SURGERY     APPLICATION OF CRANIAL NAVIGATION Left 12/08/2019   Procedure: APPLICATION OF CRANIAL NAVIGATION;  Surgeon: Vallarie Mare, MD;  Location: [place];  Service: Neurosurgery;  Laterality: Left;  APPLICATION OF CRANIAL NAVIGATION   FRAMELESS  BIOPSY WITH BRAINLAB Left 12/08/2019   Procedure: Left stereotactic brain biopsy with brainlab;  Surgeon: Vallarie Mare, MD;  Location: [place];  Service: Neurosurgery;  Laterality: Left;  Left stereotactic brain biopsy with brainlab   WISDOM TOOTH EXTRACTION     Social History:  Social History   Socioeconomic History   Marital status: Married    Spouse name: Not on file   Number of children: Not on file   Years of education: Not on file   Highest education level: Not on file  Occupational History   Not on file  Tobacco Use   Smoking status: Never   Smokeless tobacco: Never  Substance and Sexual Activity   Alcohol use: Not Currently   Drug use: Never   Sexual activity: Not on file  Other Topics Concern   Not on file  Social History Narrative   Not on file   Social Determinants of Health   Financial Resource Strain: Not on file  Food Insecurity: Not on file  Transportation Needs: Not on file  Physical Activity: Not on file  Stress: Not on file  Social Connections: Not on file  Intimate Partner Violence: Not on file   Family History:  Family History  Problem Relation Age of Onset   Migraines Mother    Thyroid cancer Father    Testicular cancer Father     Review of Systems: Constitutional: Doesn't report fevers, chills or abnormal weight loss Eyes: Doesn't report blurriness of vision Ears, nose, mouth, throat, and face: Doesn't report sore throat Respiratory: Doesn't report cough, dyspnea or wheezes Cardiovascular: Doesn't report palpitation, chest discomfort  Gastrointestinal:  Doesn't report nausea, constipation, diarrhea GU: Doesn't report incontinence Skin: Doesn't report skin rashes Neurological: Per HPI Musculoskeletal: Doesn't report joint pain Behavioral/Psych: +anxiety  Physical Exam: Vitals:   11/05/20 1435  BP: 115/73   Pulse: 80  Resp: 18  Temp: 98.9 F (37.2 C)  SpO2: 97%   KPS: 70. General: Alert, cooperative, pleasant, in no acute distress Head: Normal EENT: No conjunctival injection or scleral icterus.  Lungs: Resp effort normal Cardiac: Regular rate Abdomen: Non-distended abdomen Skin: No rashes cyanosis or petechiae. Extremities: No clubbing or edema  Neurologic Exam: Mental Status: Awake, alert, attentive to examiner. Oriented to self and environment. Expressive dysphasia Cranial Nerves: Visual acuity is grossly normal. Visual fields are full. Extra-ocular movements intact. No ptosis. Face is symmetric Motor: Tone and bulk are normal. Right arm 4/5 with impaired fine motor coordination, R leg 4+/5. Reflexes are symmetric, no pathologic reflexes present.  Sensory: Intact to light touch Gait: Hemiparetic   Labs: I have reviewed the data as listed    Component Value Date/Time   NA 136 10/29/2020 0158   K 3.9 10/29/2020 0158   CL 108 10/29/2020 0158  CO2 19 (L) 10/29/2020 0158   GLUCOSE 121 (H) 10/29/2020 0158   BUN 16 10/29/2020 0158   CREATININE 0.74 10/29/2020 0158   CREATININE 0.99 08/20/2020 1205   CALCIUM 8.8 (L) 10/29/2020 0158   PROT 6.7 10/28/2020 1128   ALBUMIN 3.8 10/28/2020 1128   AST 15 10/28/2020 1128   AST 14 (L) 08/20/2020 1205   ALT 14 10/28/2020 1128   ALT 16 08/20/2020 1205   ALKPHOS 41 10/28/2020 1128   BILITOT 0.7 10/28/2020 1128   BILITOT 0.6 08/20/2020 1205   GFRNONAA >60 10/29/2020 0158   GFRNONAA >60 08/20/2020 1205   GFRAA >60 12/09/2019 0623   Lab Results  Component Value Date   WBC 8.5 10/29/2020   NEUTROABS 5.3 10/28/2020   HGB 12.7 10/29/2020   HCT 34.9 (L) 10/29/2020   MCV 92.1 10/29/2020   PLT 173 10/29/2020   Imaging:  [place] Clinician Interpretation: I have personally reviewed the CNS images as listed.  My interpretation, in the context of the patient's clinical presentation, is  changes secondary to inflammatory effects of status  epilepticus vs progression of tumor  CT Head Wo Contrast  Result Date: 10/28/2020 CLINICAL DATA:  Seizure, abnormal neuro exam. Additional provided: Right-sided weakness, history of tumor. EXAM: CT HEAD WITHOUT CONTRAST TECHNIQUE: Contiguous axial images were obtained from the base of the skull through the vertex without intravenous contrast. COMPARISON:  Head CT 10/26/2020.  Brain MRI 10/21/2020. FINDINGS: Brain: Cerebral volume is normal. Redemonstrated large infiltrative mass with associated edema involving the left frontal lobe, left parietal lobe, left insula, left subinsular region, posterior limb of left internal capsule and anteromedial left temporal lobe. Redemonstrated tumor cyst within the left frontal lobe, measuring up to 3.5 cm in greatest dimension, not significantly changed from the head CT of 10/26/2020. As before, there are small nodular foci of calcification along the periphery of the tumor cyst. Mass effect appears stable as compared to the head CT of 10/26/2020 with partial effacement of the left lateral ventricle and 6 mm rightward midline shift measured at the level of the septum pellucidum. No evidence of acute intracranial hemorrhage or acute demarcated cortical infarction. No extra-axial fluid collection. Vascular: No hyperdense vessel. Skull: Normal. Negative for fracture or focal lesion. Sinuses/Orbits: Visualized orbits show no acute finding. No significant paranasal sinus disease at the imaged levels. IMPRESSION: No significant interval change as compared to the head CT of 10/26/2020. Redemonstrated large infiltrative mass with associated edema within the left frontal lobe, parietal lobe, insula/subinsular region, posterior limb of left internal capsule and anteromedial temporal lobe. Stable size of an associated tumor cyst again measuring up to 3.5 cm in greatest dimension. Unchanged mass effect with partial effacement of the left lateral ventricle and 6 mm rightward midline shift.  No evidence of acute intracranial hemorrhage or acute infarction. Electronically Signed   By: Kellie Simmering DO   On: 10/28/2020 12:24   MR BRAIN WO CONTRAST  Result Date: 10/28/2020 CLINICAL DATA:  Seizure, abnormal neuro exam. EXAM: MRI HEAD WITHOUT CONTRAST TECHNIQUE: Multiplanar, multiecho pulse sequences of the brain and surrounding structures were obtained without intravenous contrast. COMPARISON:  Prior head CT examinations 10/28/2020 and earlier. Brain MRI 09/21/2020. FINDINGS: Brain: Redemonstrated large infiltrating mass with associated edema in the left frontal and parietal lobes, left insula and subinsular white matter, posterior limb of left internal capsule and left temporal lobe. T2/FLAIR hyperintense signal abnormality has increased in extent as compared to the prior brain MRI of 09/21/2020, and this may reflect  an interval increase in associated edema or interval progression of infiltrating neoplasm. Unchanged size of an associated tumor cyst within the posterior left frontal lobe measuring up to 3.5 cm in greatest dimension. Small foci of calcification along the periphery of the tumor cyst were better appreciated on recent prior head CTs. Mass effect with partial effacement of the left lateral ventricle and 6 mm rightward midline shift, unchanged as compared to the head CT performed earlier today, but slightly increased as compared to the brain MRI of 09/21/2020 (midline shift measured 5 mm on the prior MRI). Increased from the prior MRI, there is partial effacement of the basal cisterns on the left, and the medial left temporal lobe exerts mild mass effect upon the left cerebral peduncle. No evidence of hydrocephalus or ventricular entrapment. No extra-axial fluid collection. Vascular: Expected proximal arterial flow voids. Redemonstrated right frontal lobe developmental venous anomaly. Skull and upper cervical spine: No focal marrow lesion. Sinuses/Orbits: Visualized orbits show no acute  finding. No significant paranasal sinus disease. IMPRESSION: Redemonstrated large infiltrating mass with associated edema in the left frontal and parietal lobes, left insula/subinsular white matter, posterior limb of left internal capsule and left temporal lobe. T2/FLAIR hyperintense signal abnormality has increased in extent as compared to the prior brain MRI of 09/21/2020, and this may reflect an interval increase in associated edema or interval progression of infiltrating neoplasm. Unchanged size of an associated tumor cyst within the posterior left frontal lobe, again measuring up to 3.5 cm in greatest dimension. Mass effect with partial effacement of the left lateral ventricle and 6 mm rightward midline shift, unchanged as compared to the head CT performed earlier today, but slightly increased as compared to the brain MRI of 09/21/2020 (midline shift measured 5 mm on the prior MRI). Also increased from the prior MRI, there is partial effacement of the basal cisterns on the left, with the medial left temporal lobe exerting mild mass effect upon the left cerebral peduncle. No evidence of hydrocephalus or ventricular entrapment at this time. Electronically Signed   By: Kellie Simmering DO   On: 10/28/2020 13:48   Overnight EEG with video  Result Date: 10/29/2020 Lora Havens, MD     10/30/2020  9:10 AM Patient Name: Candace [name] MRN: [id number] Epilepsy Attending: Lora Havens Referring Physician/Provider: Dr Kerney Elbe Duration: 10/28/2020 1654 to 10/29/2020 1654  Patient history: 40 year old female with known left temporofrontal grade III astrocytoma s/p chemotherapy and radiotherapy. Presents with recurrence of seizure activity. EEG to evaluate for seizure  Level of alertness: Awake, asleep  AEDs during EEG study: LEV  Technical aspects: This EEG study was done with scalp electrodes positioned according to the 10-20 International system of electrode placement. Electrical activity was acquired at a  sampling rate of 500Hz  and reviewed with a high frequency filter of 70Hz  and a low frequency filter of 1Hz . EEG data were recorded continuously and digitally stored.  Description: The posterior dominant rhythm consists of 9-10 Hz activity of moderate voltage (25-35 uV) seen predominantly in posterior head regions, symmetric and reactive to eye opening and eye closing.  Sleep was characterized by vertex waves, sleep spindles (12 to 14 Hz), maximal frontocentral region. EEG showed continuous 3 to 6 Hz theta-delta slowing in left hemisphere, maximal left temporal region. Lateralized periodic discharges were noted in left temporal region at 0.5-1hz  which at times appear rhythmic.  Focal seizures were also noted arising from left centro-temporal region on 10/29/2020 at [place], [place], 0824, 1227 and  1304, average duration of seizure about 3 minute.  During the seizures, patient was either noted to be staring off or had right facial twitching.  Hyperventilation and photic stimulation were not performed.    ABNORMALITY - Seizure, left centro- temporal region - Lateralized periodic discharges left temporal region ( LPD+) - Continuous slow, left hemisphere, maximal left temporal region  IMPRESSION: This study showed seizures during which patient was noted to be staring off and at times had right facial twitching.  Seizures were noted to be arising from left centro-temporal region, on 10/29/2020 at 0526, 0735, 0824, 1227 and 1304, average duration of seizure about 3 minute. There is also evidence of epileptogenicity arising from left temporal region as well as  cortical dysfunction arising from left hemisphere, maximal left temporal region likely secondary to underlying mass. The lateralized periodic discharges at times appear rhythmic with high potential for seizure recurrence.   Candace [name]     Assessment/Plan Astrocytoma of frontal lobe ([place]) [C71.1]   Candace [name] is clinically improving following her hospital  course.  She is doing well with the higher dose Keppra, Vimat and Decadron.  Although she is behind where she was from a functional standpoint prior to first seizure on 7/29, there has been clear upward trajectory over past few days.  MRI scan was reviewed, we discussed difficulty interpreting the study in presence of refractory status epilepticus.  We recommended continuing Keppra 20769m BID, Vimpat 1580mBID.  Decadron should be decreased to 69m61mID x3 days, then decreased to 69mg80mily if tolerated.  Next MRI should be in ~1 month; if static or continued progression we will initiate chemotherapy.  We will give her a call to check in next Monday.    All questions were answered. The patient knows to call the clinic with any problems, questions or concerns. No barriers to learning were detected.  The total time spent in the encounter was 30 minutes and more than 50% was on counseling and review of test results   ZachVentura Sellers Medical Director of Neuro-Oncology [id number]08/22 4:03 PM

## 2020-11-06 ENCOUNTER — Telehealth: Payer: Self-pay | Admitting: Internal Medicine

## 2020-11-06 NOTE — Telephone Encounter (Signed)
Scheduled per 08/08 los, patient has been called and voicemail was left. 

## 2020-11-07 ENCOUNTER — Encounter: Payer: Self-pay | Admitting: Internal Medicine

## 2020-11-09 ENCOUNTER — Encounter: Payer: Self-pay | Admitting: Internal Medicine

## 2020-11-12 ENCOUNTER — Other Ambulatory Visit: Payer: Self-pay | Admitting: Internal Medicine

## 2020-11-12 DIAGNOSIS — C719 Malignant neoplasm of brain, unspecified: Secondary | ICD-10-CM

## 2020-11-13 ENCOUNTER — Encounter: Payer: Self-pay | Admitting: Internal Medicine

## 2020-11-13 ENCOUNTER — Inpatient Hospital Stay (HOSPITAL_BASED_OUTPATIENT_CLINIC_OR_DEPARTMENT_OTHER): Payer: BC Managed Care – PPO | Admitting: Internal Medicine

## 2020-11-13 DIAGNOSIS — R569 Unspecified convulsions: Secondary | ICD-10-CM

## 2020-11-13 DIAGNOSIS — C711 Malignant neoplasm of frontal lobe: Secondary | ICD-10-CM

## 2020-11-13 NOTE — Progress Notes (Signed)
I connected with Candace [name] on 11/13/20 at  9:30 AM EDT by telephone visit and verified that I am speaking with the correct person using two identifiers.  I discussed the limitations, risks, security and privacy concerns of performing an evaluation and management service by telemedicine and the availability of in-person appointments. I also discussed with the patient that there may be a patient responsible charge related to this service. The patient expressed understanding and agreed to proceed.  Other persons participating in the visit and their role in the encounter:  spouse  Patient's location:  Home  Provider's location:  Office  Chief Complaint:  Astrocytoma of frontal lobe ([place])  Focal seizure ([place])  History of Present Ilness: Candace [name] describes improvement in right sided weakness, expressive language since last visit.  She has experienced two very brief seizures over the past two weeks, both consisting of just 10-15 seconds of right facial twitching.  Currently dosing Keppra '2000mg'$  BID, Vimpat '150mg'$  BID, and decadron taper is at '6mg'$ /'4mg'$  for the next three days.  Sleep has been interrupted, she did have a rash with the Trazodone so discontinued that recently.  Observations: Dysphasia noted, modest improvement from prior eval  Assessment and Plan: Astrocytoma of frontal lobe ([facility])  Focal seizure ([place])  Recommend continued slow taper of decadron, dropping by '2mg'$  every 3 days if seizure free. They will continue to update [place] with further seizure events.  Consideration given to increasing Vimpat to '200mg'$  BID.  Follow Up Instructions: RTC after MRI study in late august  I discussed the assessment and treatment plan with the patient.  The patient was provided an opportunity to ask questions and all were answered.  The patient agreed with the plan and demonstrated understanding of the instructions.    The patient was advised to call back or seek an in-person evaluation if the  symptoms worsen or if the condition fails to improve as anticipated.  I provided 5-10 minutes of non-face-to-face time during this enocunter.  Ventura Sellers, MD   I provided 15 minutes of non face-to-face telephone visit time during this encounter, and > 50% was spent counseling as documented under my assessment & plan.

## 2020-11-14 ENCOUNTER — Other Ambulatory Visit: Payer: Self-pay | Admitting: Internal Medicine

## 2020-11-14 MED ORDER — AMITRIPTYLINE HCL 50 MG PO TABS: 50.0000 mg | ORAL_TABLET | Freq: Every day | ORAL | 3 refills | Status: DC

## 2020-11-21 ENCOUNTER — Encounter: Payer: Self-pay | Admitting: Internal Medicine

## 2020-11-22 ENCOUNTER — Encounter: Payer: Self-pay | Admitting: Internal Medicine

## 2020-11-22 ENCOUNTER — Other Ambulatory Visit: Payer: Self-pay | Admitting: Internal Medicine

## 2020-11-22 MED ORDER — MIRTAZAPINE 7.5 MG PO TABS: 7.5000 mg | ORAL_TABLET | Freq: Every day | ORAL | 3 refills | Status: DC

## 2020-11-25 ENCOUNTER — Ambulatory Visit (HOSPITAL_COMMUNITY)
Admission: RE | Admit: 2020-11-25 | Discharge: 2020-11-25 | Disposition: A | Discharge status: Home / Self Care | Payer: BC Managed Care – PPO | Source: Ambulatory Visit | Attending: Internal Medicine | Admitting: Internal Medicine

## 2020-11-25 DIAGNOSIS — C711 Malignant neoplasm of frontal lobe: Secondary | ICD-10-CM | POA: Diagnosis present

## 2020-11-25 IMAGING — MR MR HEAD WO/W CM
13 series · 48 of 48 positions shown · IV contrast (gadavist)
Comparison: Noncontrast MRI from [DATE]. Contrast MRI from
[DATE].

CLINICAL DATA: Brain/CNS neoplasm, assess treatment response

EXAM:
MRI HEAD WITHOUT AND WITH CONTRAST
TECHNIQUE: Multiplanar, multiecho pulse sequences of the brain and surrounding
structures were obtained without and with intravenous contrast.
CONTRAST:  6mL GADAVIST GADOBUTROL 1 MMOL/ML IV SOLN

[Series 5: DWI · axial · 3.0mm · 1.36mm/px · z∈[-61,+90]mm · 5 of 103 slices shown (1 of 2)]
[im 1/103]
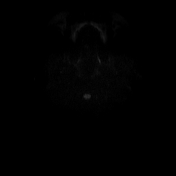
[im 26/103]
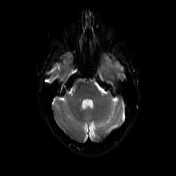
[im 52/103]
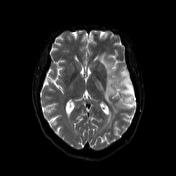
[im 77/103]
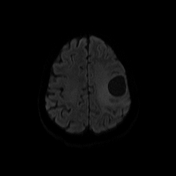
[im 103/103]
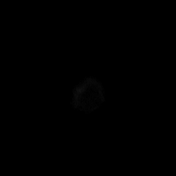

[Series 6: DWI · axial · 3.0mm · 1.36mm/px · z∈[-61,+90]mm · 3 of 51 slices shown (2 of 2)]
[im 1/51]
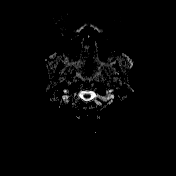
[im 26/51]
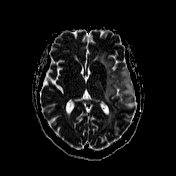
[im 51/51]
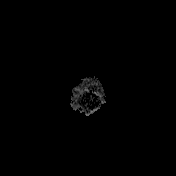

[Series 7: T1 · sagittal · 5.0mm · 0.75mm/px · 1 of 25 slices shown (1 of 2)]
[im 1/25]
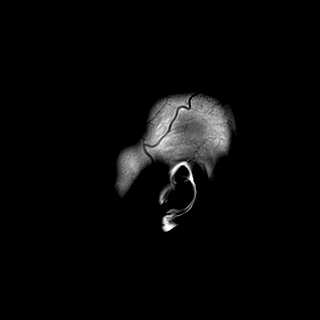

[Series 8: T2 · axial · 5.0mm · 0.62mm/px · z∈[-71,+90]mm · 2 of 26 slices shown]
[im 1/26]
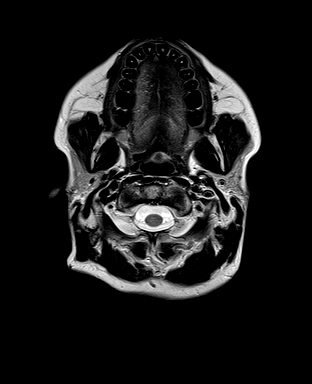
[im 26/26]
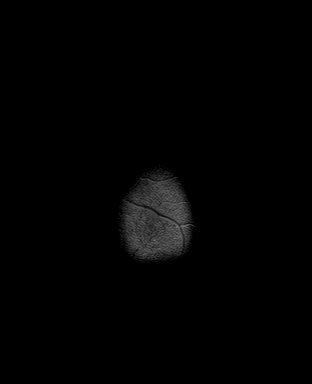

[Series 9: swi_images · axial · 3.0mm · 0.75mm/px · z∈[-72,+91]mm · 3 of 56 slices shown]
[im 1/56]
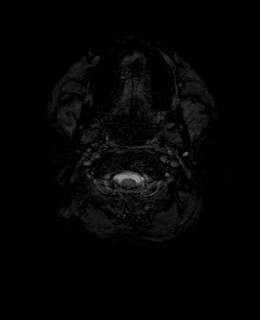
[im 28/56]
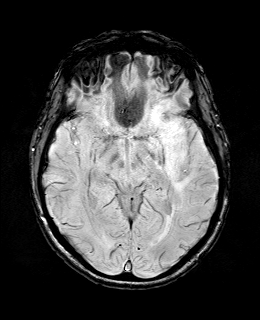
[im 56/56]
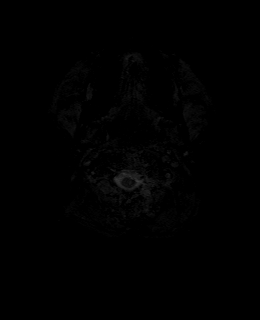

[Series 11: FLAIR · axial · 3.0mm · 0.75mm/px · z∈[-67,+85]mm · 3 of 52 slices shown]
[im 1/52]
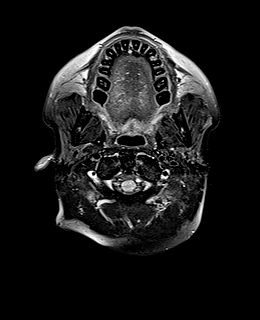
[im 26/52]
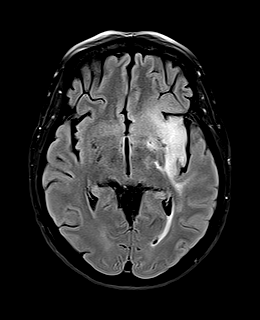
[im 52/52]
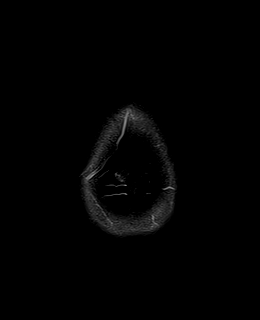

[Series 12: T1 · axial · 1.0mm · 0.94mm/px · z∈[-62,+95]mm · 10 of 160 slices shown (2 of 2)]
[im 1/160]
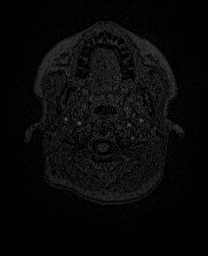
[im 18/160]
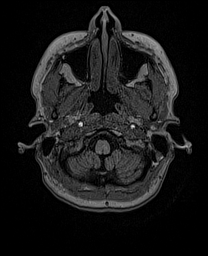
[im 36/160]
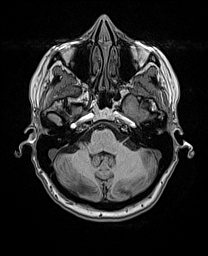
[im 54/160]
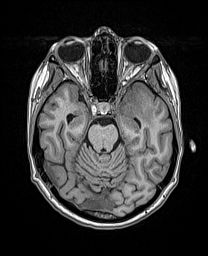
[im 71/160]
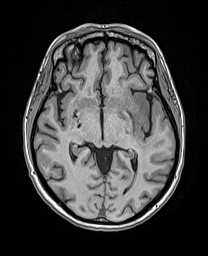
[im 89/160]
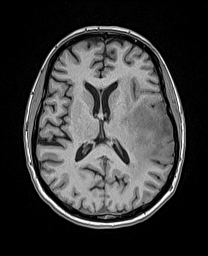
[im 107/160]
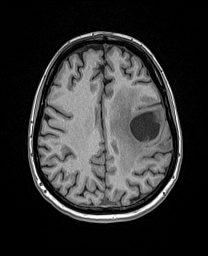
[im 124/160]
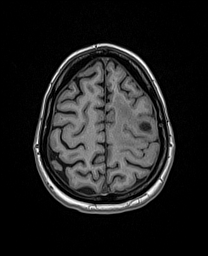
[im 142/160]
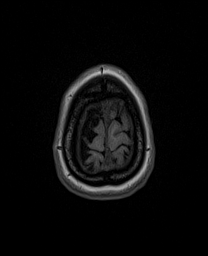
[im 160/160]
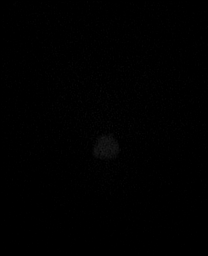

[Series 13: cor dwi_tracew · coronal · 5.0mm · 1.53mm/px · 4 of 60 slices shown]
[im 1/60]
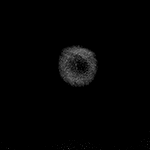
[im 20/60]
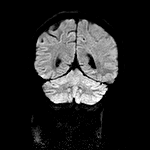
[im 40/60]
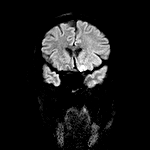
[im 60/60]
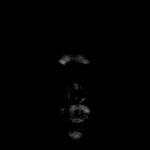

[Series 14: cor dwi_adc · coronal · 5.0mm · 1.53mm/px · 2 of 29 slices shown]
[im 1/29]
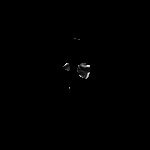
[im 29/29]
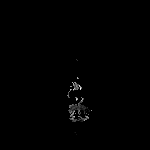

[Series 15: T2 post-contrast · coronal · 5.0mm · 0.57mm/px · 2 of 30 slices shown]
[im 1/30]
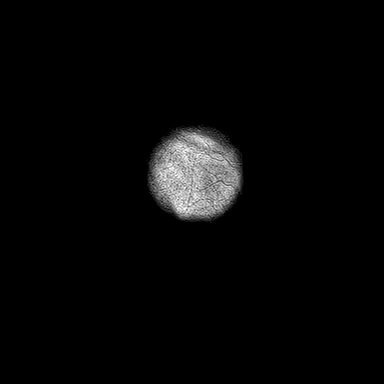
[im 30/30]
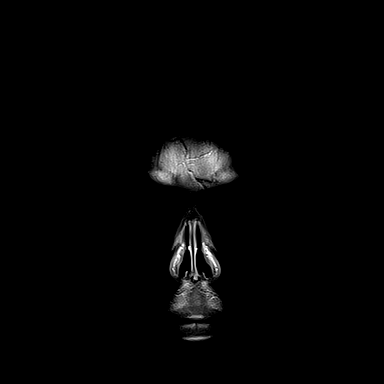

[Series 16: T1 post-contrast · axial · 1.0mm · 0.94mm/px · z∈[-62,+95]mm · 10 of 160 slices shown (1 of 3)]
[im 1/160]
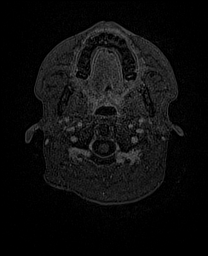
[im 18/160]
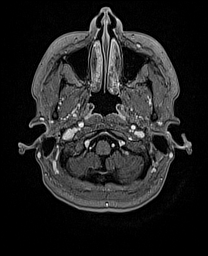
[im 36/160]
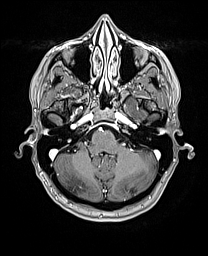
[im 54/160]
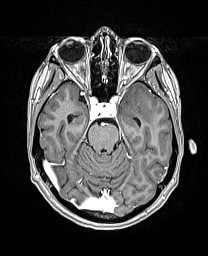
[im 71/160]
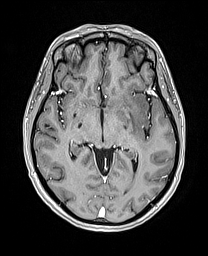
[im 89/160]
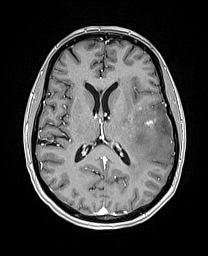
[im 107/160]
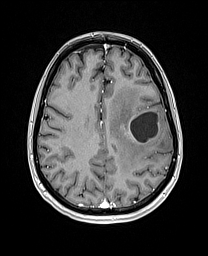
[im 124/160]
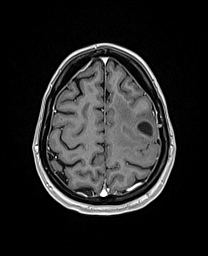
[im 142/160]
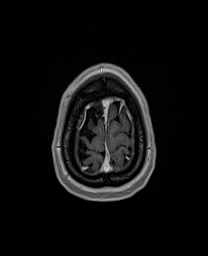
[im 160/160]
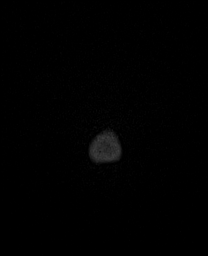

[Series 17: T1 post-contrast · coronal · 5.0mm · 0.43mm/px · 2 of 30 slices shown (2 of 3)]
[im 1/30]
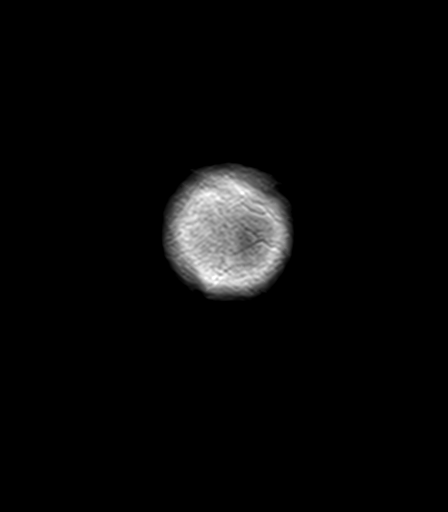
[im 30/30]
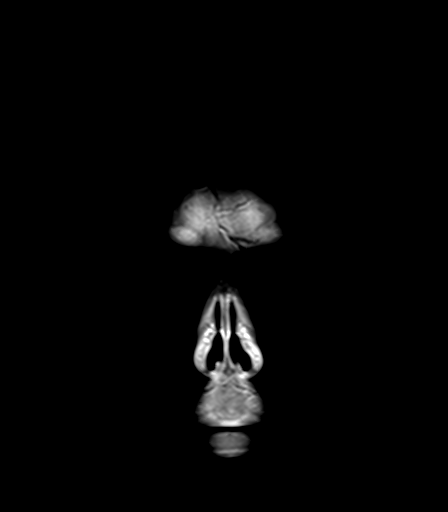

[Series 18: T1 post-contrast · sagittal · 5.0mm · 0.75mm/px · 1 of 25 slices shown (3 of 3)]
[im 1/25]
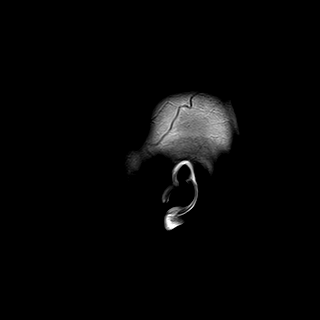

[48 of 48 positions shown; findings below may reference images not displayed]

FINDINGS: Brain: Redemonstrated infiltrating mass in the left frontal and
parietal lobes, left in FLAIR/subinsular white matter, posterior
limb of the internal capsule and left temporal the T2/FLAIR
hyperintense signal abnormality is decreased in signal intensity and
mass effect with overall similar extent. Rightward midline shift is
also improved and approximately 3 mm at the foramen of [place]
(previously 5 mm). The associated tumor cyst in the posterior left
frontal lobe is slightly increased in size, measuring approximately
31 x 29 mm on series 8, image 19 (previously 27 x 24 mm when
remeasured at this level). Surrounding patchy enhancement appears
improved relative to more remote prior from [DATE] (more
immediate prior with without contrast). No hydrocephalus, acute
hemorrhage, acute infarct, extra-axial fluid collection. Incidental
right frontal developmental venous anomaly with hemosiderin staining
from prior hemorrhage, unchanged. Biopsy tract on the left.

Vascular: Major arterial flow voids are maintained at the skull
base.

Skull and upper cervical spine: Normal marrow signal.

Sinuses/Orbits: Clear sinuses.  Unremarkable orbits.

Other: No mastoid effusion.
IMPRESSION: 1. Improved infiltrative T2/FLAIR hyperintensity and enhancement
with decreased associated mass effect and rightward midline shift,
as detailed above.
2. Slight increase in the size of the tumor cyst in the posterior
left frontal lobe.

## 2020-11-25 MED ORDER — GADOBUTROL 1 MMOL/ML IV SOLN
6.0000 mL | Freq: Once | INTRAVENOUS | Status: AC | PRN
  Administered 2020-11-25: 6 mL via INTRAVENOUS

## 2020-11-26 ENCOUNTER — Inpatient Hospital Stay: Payer: BC Managed Care – PPO

## 2020-11-27 ENCOUNTER — Other Ambulatory Visit: Payer: Self-pay

## 2020-11-27 ENCOUNTER — Inpatient Hospital Stay (HOSPITAL_BASED_OUTPATIENT_CLINIC_OR_DEPARTMENT_OTHER): Payer: BC Managed Care – PPO | Admitting: Internal Medicine

## 2020-11-27 VITALS — BP 134/90 | HR 73 | Temp 97.8°F | Resp 17 | Wt 132.9 lb

## 2020-11-27 DIAGNOSIS — C711 Malignant neoplasm of frontal lobe: Secondary | ICD-10-CM

## 2020-11-27 NOTE — Progress Notes (Signed)
[facility] [place], [place] [address] [phone]   Interval Evaluation  Date of Service: 11/27/20 Patient Name: Candace [name] Patient MRN: [id number] Patient DOB: 1980-09-20 Provider: Ventura Sellers, MD  Identifying Statement:  Candace [name] is a 40 y.o. female with left temporal  grade 3 astrocytoma     Oncologic History: Oncology History  Astrocytoma of frontal lobe ([place] [facility])  12/08/2019 Surgery   Stereotactic R temporal biopsy by Dr. Marcello [name].  Path demonstrates Astrocytoma, IDH-mt, WHO grade 3   01/17/2020 - 03/02/2020 Radiation Therapy   IMRT and concurrent Temodar with Dr. Isidore Moos     Biomarkers:  MGMT Unknown.  IDH 1/2 Mutated.  EGFR Unknown  TERT Unknown   Interval History:  Candace [name] presents today for follow up after recent MRI brain.  Continues to gain function and agility of the right hand, improve with her gait.  Decadron is down to 79m daily.   Her language impairments are about the same as last month.  She is walking independently with almost no dragging of the right leg.  Does describe episodes of numbness affecting the right hand (both sides) which last for about 1-2 minutes.  Frequency is sporadic.  H+P (12/23/19) Patient presented to medical attention early in September with first ever seizure.  Event was described as "sudden onset left arm numbness" followed by "face twisting, and loss of consciousness for at least 30 minutes".  CNS imaging demonstrated a large left frontal non enhancing mass consistent with primary brain tumor.  She underwent biopsy on 12/08/19 with Dr. TMarcello [name]  Following surgery she has some slurring of speech but no other significant complaints, returned to prior baseline upon discharge.  Now home, she has had no breathrough events and no focal neurologic complaints.    Medications: Current Outpatient Medications on File Prior to Visit  Medication Sig Dispense Refill    dexamethasone (DECADRON) 4 MG tablet Take 1 tablet (4 mg total) by mouth 2 (two) times daily. 60 tablet 0   GENISTEIN PO Take 1 tablet by mouth at bedtime.     lacosamide 150 MG TABS Take 1 tablet (150 mg total) by mouth 2 (two) times daily. 60 tablet 0   levETIRAcetam (KEPPRA) 500 MG tablet Take 4 tablets (2,000 mg total) by mouth 2 (two) times daily. 240 tablet 0   LYCOPENE PO Take 1 capsule by mouth daily.     MELATONIN PO Take 1 tablet by mouth at bedtime.     MILK THISTLE EXTRACT PO Take 1 capsule by mouth at bedtime. Silibinin     Omega-3 Fatty Acids (FISH OIL PO) Take 6 tablets by mouth daily.     OVER THE COUNTER MEDICATION Take 1 capsule by mouth daily. [place] mushroom     OVER THE COUNTER MEDICATION Take 1 tablet by mouth daily. Berberine     OVER THE COUNTER MEDICATION Take 1 capsule by mouth daily. Frankincense     RESVERATROL PO Take 1 capsule by mouth at bedtime.     TURMERIC PO Take 1 tablet by mouth daily.     famotidine (PEPCID) 20 MG tablet Take 1 tablet (20 mg total) by mouth daily. (Patient not taking: No sig reported) 30 tablet 0   ibuprofen (ADVIL) 200 MG tablet Take 600 mg by mouth every 6 (six) hours as needed for headache. (Patient not taking: No sig reported)     lacosamide (VIMPAT) 50 MG TABS tablet Take 3 tablets (150 mg  total) by mouth in the morning and at bedtime. 6 tablet 0   mirtazapine (REMERON) 7.5 MG tablet Take 1 tablet (7.5 mg total) by mouth at bedtime. (Patient not taking: Reported on 11/27/2020) 30 tablet 3   No current facility-administered medications on file prior to visit.    Allergies:  Allergies  Allergen Reactions   Trazodone And Nefazodone Itching   Penicillins Rash   Past Medical History:  Past Medical History:  Diagnosis Date   Brain tumor [facility])    Headache    Past Surgical History:  Past Surgical History:  Procedure Laterality Date   ABDOMINAL SURGERY     APPLICATION OF CRANIAL NAVIGATION Left 12/08/2019   Procedure: APPLICATION  OF CRANIAL NAVIGATION;  Surgeon: Vallarie Mare, MD;  Location: [facility];  Service: Neurosurgery;  Laterality: Left;  APPLICATION OF CRANIAL NAVIGATION   FRAMELESS  BIOPSY WITH BRAINLAB Left 12/08/2019   Procedure: Left stereotactic brain biopsy with brainlab;  Surgeon: Vallarie Mare, MD;  Location: [facility];  Service: Neurosurgery;  Laterality: Left;  Left stereotactic brain biopsy with brainlab   WISDOM TOOTH EXTRACTION     Social History:  Social History   Socioeconomic History   Marital status: Married    Spouse name: Not on file   Number of children: Not on file   Years of education: Not on file   Highest education level: Not on file  Occupational History   Not on file  Tobacco Use   Smoking status: Never   Smokeless tobacco: Never  Substance and Sexual Activity   Alcohol use: Not Currently   Drug use: Never   Sexual activity: Not on file  Other Topics Concern   Not on file  Social History Narrative   Not on file   Social Determinants of Health   Financial Resource Strain: Not on file  Food Insecurity: Not on file  Transportation Needs: Not on file  Physical Activity: Not on file  Stress: Not on file  Social Connections: Not on file  Intimate Partner Violence: Not on file   Family History:  Family History  Problem Relation Age of Onset   Migraines Mother    Thyroid cancer Father    Testicular cancer Father     Review of Systems: Constitutional: Doesn't report fevers, chills or abnormal weight loss Eyes: Doesn't report blurriness of vision Ears, nose, mouth, throat, and face: Doesn't report sore throat Respiratory: Doesn't report cough, dyspnea or wheezes Cardiovascular: Doesn't report palpitation, chest discomfort  Gastrointestinal:  Doesn't report nausea, constipation, diarrhea GU: Doesn't report incontinence Skin: Doesn't report skin rashes Neurological: Per HPI Musculoskeletal: Doesn't report joint pain Behavioral/Psych: +anxiety  Physical  Exam: Vitals:   11/27/20 1204  BP: 134/90  Pulse: 73  Resp: 17  Temp: 97.8 F (36.6 C)  SpO2: 100%   KPS: 70. General: Alert, cooperative, pleasant, in no acute distress Head: Normal EENT: No conjunctival injection or scleral icterus.  Lungs: Resp effort normal Cardiac: Regular rate Abdomen: Non-distended abdomen Skin: No rashes cyanosis or petechiae. Extremities: No clubbing or edema  Neurologic Exam: Mental Status: Awake, alert, attentive to examiner. Oriented to self and environment. Expressive dysphasia Cranial Nerves: Visual acuity is grossly normal. Visual fields are full. Extra-ocular movements intact. No ptosis. Face is symmetric Motor: Tone and bulk are normal. Right arm 5/5 with subtly impaired fine motor coordination, R leg 4+/5. Reflexes are symmetric, no pathologic reflexes present.  Sensory: Intact to light touch Gait: Hemiparetic   Labs: I have reviewed  the data as listed    Component Value Date/Time   NA 136 10/29/2020 0158   K 3.9 10/29/2020 0158   CL 108 10/29/2020 0158   CO2 19 (L) 10/29/2020 0158   GLUCOSE 121 (H) 10/29/2020 0158   BUN 16 10/29/2020 0158   CREATININE 0.74 10/29/2020 0158   CREATININE 0.99 08/20/2020 1205   CALCIUM 8.8 (L) 10/29/2020 0158   PROT 6.7 10/28/2020 1128   ALBUMIN 3.8 10/28/2020 1128   AST 15 10/28/2020 1128   AST 14 (L) 08/20/2020 1205   ALT 14 10/28/2020 1128   ALT 16 08/20/2020 1205   ALKPHOS 41 10/28/2020 1128   BILITOT 0.7 10/28/2020 1128   BILITOT 0.6 08/20/2020 1205   GFRNONAA >60 10/29/2020 0158   GFRNONAA >60 08/20/2020 1205   GFRAA >60 12/09/2019 0623   Lab Results  Component Value Date   WBC 8.5 10/29/2020   NEUTROABS 5.3 10/28/2020   HGB 12.7 10/29/2020   HCT 34.9 (L) 10/29/2020   MCV 92.1 10/29/2020   PLT 173 10/29/2020   Imaging:  [facility] Clinician Interpretation: I have personally reviewed the CNS images as listed.  My interpretation, in the context of the patient's clinical presentation, is   improved T2 signal burden and mass effect, improved contrast burden, but progression of cystic component.  MR BRAIN WO CONTRAST  Result Date: 10/28/2020 CLINICAL DATA:  Seizure, abnormal neuro exam. EXAM: MRI HEAD WITHOUT CONTRAST TECHNIQUE: Multiplanar, multiecho pulse sequences of the brain and surrounding structures were obtained without intravenous contrast. COMPARISON:  Prior head CT examinations 10/28/2020 and earlier. Brain MRI 09/21/2020. FINDINGS: Brain: Redemonstrated large infiltrating mass with associated edema in the left frontal and parietal lobes, left insula and subinsular white matter, posterior limb of left internal capsule and left temporal lobe. T2/FLAIR hyperintense signal abnormality has increased in extent as compared to the prior brain MRI of 09/21/2020, and this may reflect an interval increase in associated edema or interval progression of infiltrating neoplasm. Unchanged size of an associated tumor cyst within the posterior left frontal lobe measuring up to 3.5 cm in greatest dimension. Small foci of calcification along the periphery of the tumor cyst were better appreciated on recent prior head CTs. Mass effect with partial effacement of the left lateral ventricle and 6 mm rightward midline shift, unchanged as compared to the head CT performed earlier today, but slightly increased as compared to the brain MRI of 09/21/2020 (midline shift measured 5 mm on the prior MRI). Increased from the prior MRI, there is partial effacement of the basal cisterns on the left, and the medial left temporal lobe exerts mild mass effect upon the left cerebral peduncle. No evidence of hydrocephalus or ventricular entrapment. No extra-axial fluid collection. Vascular: Expected proximal arterial flow voids. Redemonstrated right frontal lobe developmental venous anomaly. Skull and upper cervical spine: No focal marrow lesion. Sinuses/Orbits: Visualized orbits show no acute finding. No significant paranasal  sinus disease. IMPRESSION: Redemonstrated large infiltrating mass with associated edema in the left frontal and parietal lobes, left insula/subinsular white matter, posterior limb of left internal capsule and left temporal lobe. T2/FLAIR hyperintense signal abnormality has increased in extent as compared to the prior brain MRI of 09/21/2020, and this may reflect an interval increase in associated edema or interval progression of infiltrating neoplasm. Unchanged size of an associated tumor cyst within the posterior left frontal lobe, again measuring up to 3.5 cm in greatest dimension. Mass effect with partial effacement of the left lateral ventricle and 6 mm rightward midline  shift, unchanged as compared to the head CT performed earlier today, but slightly increased as compared to the brain MRI of 09/21/2020 (midline shift measured 5 mm on the prior MRI). Also increased from the prior MRI, there is partial effacement of the basal cisterns on the left, with the medial left temporal lobe exerting mild mass effect upon the left cerebral peduncle. No evidence of hydrocephalus or ventricular entrapment at this time. Electronically Signed   By: Kellie Simmering DO   On: 10/28/2020 13:48   MR BRAIN W WO CONTRAST  Result Date: 11/26/2020 CLINICAL DATA:  Brain/CNS neoplasm, assess treatment response EXAM: MRI HEAD WITHOUT AND WITH CONTRAST TECHNIQUE: Multiplanar, multiecho pulse sequences of the brain and surrounding structures were obtained without and with intravenous contrast. CONTRAST:  44m GADAVIST GADOBUTROL 1 MMOL/ML IV SOLN COMPARISON:  Noncontrast MRI from October 28, 2020. Contrast MRI from September 21, 2020. FINDINGS: Brain: Redemonstrated infiltrating mass in the left frontal and parietal lobes, left in FLAIR/subinsular white matter, posterior limb of the internal capsule and left temporal the T2/FLAIR hyperintense signal abnormality is decreased in signal intensity and mass effect with overall similar extent. Rightward  midline shift is also improved and approximately 3 mm at the foramen of [place] (previously 5 mm). The associated tumor cyst in the posterior left frontal lobe is slightly increased in size, measuring approximately 31 x 29 mm on series 8, image 19 (previously 27 x 24 mm when remeasured at this level). Surrounding patchy enhancement appears improved relative to more remote prior from September 21, 2020 (more immediate prior with without contrast). No hydrocephalus, acute hemorrhage, acute infarct, extra-axial fluid collection. Incidental right frontal developmental venous anomaly with hemosiderin staining from prior hemorrhage, unchanged. Biopsy tract on the left. Vascular: Major arterial flow voids are maintained at the skull base. Skull and upper cervical spine: Normal marrow signal. Sinuses/Orbits: Clear sinuses.  Unremarkable orbits. Other: No mastoid effusion. IMPRESSION: 1. Improved infiltrative T2/FLAIR hyperintensity and enhancement with decreased associated mass effect and rightward midline shift, as detailed above. 2. Slight increase in the size of the tumor cyst in the posterior left frontal lobe. Electronically Signed   By: FMargaretha SheffieldM.D.   On: 11/26/2020 15:23   Overnight EEG with video  Result Date: 10/29/2020 YLora Havens MD     10/30/2020  9:10 AM Patient Name: JLiberta GimpelMRN: [id number] Attending: PLora HavensReferring Physician/Provider: Dr EKerney ElbeDuration: 10/28/2020 1654 to 10/29/2020 1654  Patient history: 40year old female with known left temporofrontal grade III astrocytoma s/p chemotherapy and radiotherapy. Presents with recurrence of seizure activity. EEG to evaluate for seizure  Level of alertness: Awake, asleep  AEDs during EEG study: LEV  Technical aspects: This EEG study was done with scalp electrodes positioned according to the 10-20 International system of electrode placement. Electrical activity was acquired at a sampling rate of _0  and reviewed with  a high frequency filter of _1  and a low frequency filter of _2 . EEG data were recorded continuously and digitally stored.  Description: The posterior dominant rhythm consists of 9-10 Hz activity of moderate voltage (25-35 uV) seen predominantly in posterior head regions, symmetric and reactive to eye opening and eye closing.  Sleep was characterized by vertex waves, sleep spindles (12 to 14 Hz), maximal frontocentral region. EEG showed continuous 3 to 6 Hz theta-delta slowing in left hemisphere, maximal left temporal region. Lateralized periodic discharges were noted in left temporal region at 0.5-_3  which at times appear rhythmic.  Focal  seizures were also noted arising from left centro-temporal region on 10/29/2020 at 0526, 0735, 0824, 1227 and 1304, average duration of seizure about 3 minute.  During the seizures, patient was either noted to be staring off or had right facial twitching.  Hyperventilation and photic stimulation were not performed.    ABNORMALITY - Seizure, left centro- temporal region - Lateralized periodic discharges left temporal region ( LPD+) - Continuous slow, left hemisphere, maximal left temporal region  IMPRESSION: This study showed seizures during which patient was noted to be staring off and at times had right facial twitching.  Seizures were noted to be arising from left centro-temporal region, on 10/29/2020 at 0526, 0735, 0824, 1227 and 1304, average duration of seizure about 3 minute. There is also evidence of epileptogenicity arising from left temporal region as well as  cortical dysfunction arising from left hemisphere, maximal left temporal region likely secondary to underlying mass. The lateralized periodic discharges at times appear rhythmic with high potential for seizure recurrence.   Priyanka [name]     Assessment/Plan Astrocytoma of frontal lobe ([place]) [C71.1]  Charmine Bockrath is clinically stable today.  Episodes of hand numbness could represent small focal seizures,  though they do not impair function in any way.  We recommended continuing Keppra 2058m BID, Vimpat 156mBID for now.  We reviewed changes on MRI in tumor board this week; we discussed utility of resecting the cystic component, which is clearly progressed when compared to 6 months prior.  Resection could also help with seizure control, theoretically.   They are agreeable to meet with Dr. ThMarcello Mooresor discussion of possible surgical planning.  His office is aware.  Decadron should be decreased to 29m66maily if tolerated.  We will follow up with her after their visit with Dr. ThoMarcello Mooresecision regarding surgery.  All questions were answered. The patient knows to call the clinic with any problems, questions or concerns. No barriers to learning were detected.  The total time spent in the encounter was 40 minutes and more than 50% was on counseling and review of test results   ZacVentura SellersD Medical Director of Neuro-Oncology [facility] [place]/30/22 12:24 PM

## 2020-11-29 ENCOUNTER — Encounter: Payer: Self-pay | Admitting: Internal Medicine

## 2020-11-30 ENCOUNTER — Other Ambulatory Visit: Payer: Self-pay | Admitting: Internal Medicine

## 2020-11-30 ENCOUNTER — Other Ambulatory Visit (HOSPITAL_COMMUNITY): Payer: Self-pay

## 2020-11-30 ENCOUNTER — Other Ambulatory Visit: Payer: Self-pay

## 2020-11-30 MED ORDER — LACOSAMIDE 150 MG PO TABS
150.0000 mg | ORAL_TABLET | Freq: Two times a day (BID) | ORAL | 3 refills | Status: DC
Start: 2020-11-30 — End: 2020-11-30

## 2020-11-30 MED ORDER — LEVETIRACETAM 1000 MG PO TABS: 2000.0000 mg | ORAL_TABLET | Freq: Two times a day (BID) | ORAL | 3 refills | Status: DC

## 2020-11-30 MED ORDER — LACOSAMIDE 150 MG PO TABS: 150.0000 mg | ORAL_TABLET | Freq: Two times a day (BID) | ORAL | 3 refills | Status: DC

## 2020-12-01 ENCOUNTER — Encounter: Payer: Self-pay | Admitting: Internal Medicine

## 2020-12-04 ENCOUNTER — Telehealth: Payer: Self-pay

## 2020-12-04 NOTE — Telephone Encounter (Signed)
Spoke with patient regarding medication refill. Confirmed that she received her medication and that it had been sent to her pharmacy of choice. All questions and concerns answered.

## 2020-12-09 ENCOUNTER — Encounter: Payer: Self-pay | Admitting: Internal Medicine

## 2020-12-10 ENCOUNTER — Ambulatory Visit: Payer: BC Managed Care – PPO | Attending: Internal Medicine | Admitting: Occupational Therapy

## 2020-12-10 ENCOUNTER — Ambulatory Visit: Payer: BC Managed Care – PPO | Admitting: Speech Pathology

## 2020-12-10 ENCOUNTER — Other Ambulatory Visit: Payer: Self-pay

## 2020-12-10 ENCOUNTER — Encounter: Payer: Self-pay | Admitting: Occupational Therapy

## 2020-12-10 DIAGNOSIS — R41842 Visuospatial deficit: Secondary | ICD-10-CM | POA: Insufficient documentation

## 2020-12-10 DIAGNOSIS — R2681 Unsteadiness on feet: Secondary | ICD-10-CM | POA: Diagnosis present

## 2020-12-10 DIAGNOSIS — R4701 Aphasia: Secondary | ICD-10-CM

## 2020-12-10 DIAGNOSIS — R41840 Attention and concentration deficit: Secondary | ICD-10-CM | POA: Insufficient documentation

## 2020-12-10 DIAGNOSIS — R278 Other lack of coordination: Secondary | ICD-10-CM | POA: Insufficient documentation

## 2020-12-10 DIAGNOSIS — R208 Other disturbances of skin sensation: Secondary | ICD-10-CM | POA: Insufficient documentation

## 2020-12-10 DIAGNOSIS — R482 Apraxia: Secondary | ICD-10-CM

## 2020-12-10 DIAGNOSIS — M6281 Muscle weakness (generalized): Secondary | ICD-10-CM | POA: Diagnosis present

## 2020-12-10 DIAGNOSIS — R41841 Cognitive communication deficit: Secondary | ICD-10-CM | POA: Diagnosis present

## 2020-12-10 NOTE — Therapy (Signed)
[facility] [address] [place], [place], [address] Phone: [phone]   Fax:  [phone]  Occupational Therapy Evaluation  Patient Details  Name: Candace [name] MRN: [id number] Date of Birth: 12-01-1980 Referring Provider (OT): Dr. Cecil Cobbs   Encounter Date: 12/10/2020   OT End of Session - 12/10/20 1400     Visit Number 1    Number of Visits 17    Date for OT Re-Evaluation 02/08/21    Authorization Type [employer]--covered 100%, 30 visit limit OT/PT, 30 speech, no auth req.    OT Start Time 1319    OT Stop Time 1400    OT Time Calculation (min) 41 min    Activity Tolerance Patient tolerated treatment well    Behavior During Therapy Flat affect             Past Medical History:  Diagnosis Date   Brain tumor [facility])    Headache     Past Surgical History:  Procedure Laterality Date   ABDOMINAL SURGERY     APPLICATION OF CRANIAL NAVIGATION Left 12/08/2019   Procedure: APPLICATION OF CRANIAL NAVIGATION;  Surgeon: Vallarie Mare, MD;  Location: [place];  Service: Neurosurgery;  Laterality: Left;  APPLICATION OF CRANIAL NAVIGATION   FRAMELESS  BIOPSY WITH BRAINLAB Left 12/08/2019   Procedure: Left stereotactic brain biopsy with brainlab;  Surgeon: Vallarie Mare, MD;  Location: [place];  Service: Neurosurgery;  Laterality: Left;  Left stereotactic brain biopsy with brainlab   WISDOM TOOTH EXTRACTION      There were no vitals filed for this visit.   Subjective Assessment - 12/10/20 1237     Subjective  Pt reports that initially couldn't move R arm after seizures.  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  PMH:  hx of radiation/chemotherapy (03/02/20), hx of migraines, hx of seizures (hospitalized 10/28/20-11/01/20)    Limitations hx of seizures    Patient Stated  Goals improve RUE functional use    Currently in Pain? No/denies               [facility] OT Assessment - 12/10/20 0001       Assessment   Medical Diagnosis Grade III astrocytoma brain tumor    Referring Provider (OT) Dr. 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, none since hospitalization), has website/etsy store   Leisure cooking, Engineer, site, Public affairs consultant, Water quality scientist, does some yoga with app      ADL   Eating/Feeding Modified independent    Grooming Modified independent    Upper Body Bathing Modified independent    Lower Body Bathing Modified independent    Upper Body Dressing --   mod I   Lower Body Dressing Modified independent  Teaching laboratory technician Modified independent    ADL comments difficulty with coordination for texting      IADL   Shopping --   mother has been assisting, but has done some on her own (depends on the day)   Prior Level of Function Light Housekeeping fatigues quickly, bumps into furniture with vacuum, difficulty getting back up after squatting    Prior Level of Function Meal Prep performing now with difficulty:  fatigues quickly, needs assistance, difficulty putting items in oven (heat sensitivity)    Community Mobility Drives own vehicle    Medication Management Is responsible  for taking medication in correct dosages at correct time    Prior Level of Function Financial Management husband has always performed      Mobility   Mobility Status Independent    Mobility Status Comments Pt reports R foot dragging at times and has to concentrate to pick up foot, dificulty/concentrates with stairs      Written Expression   Dominant Hand Right    Handwriting --   Not assessed     Vision - History   Additional Comments Pt reports visual changes--difficulty with adjusting between distance and reading vision.  Will assess further in functional context.  (may be related to medication changes per pt--pt will discuss with MD).      Activity Tolerance   Activity Tolerance Comments pt reports that she fatigues quickly, but it depends on the day and that it limits activity, needs to nap      Cognition   Overall Cognitive Status Impaired/Different from baseline   to be assessed further in functional context   Attention Sustained   easily distracted per pt report, limits ability to read   Memory Impaired   per pt   Cognition Comments Pt with slow/difficulty with expressive speech and reports incr difficulty with looking at someone.  Pt also reports that she has "imagined" some things recently and that it may be a medication side effect (is communicating this with MD)      Sensation   Light Touch Impaired by gross assessment   numbness initially, has improved   Hot/Cold Impaired by gross assessment   feels cold, heat sensitivity   Proprioception Impaired by gross assessment   uncomfortable holding hands, can't feel all fingers at times     Coordination   Gross Motor Movements are Fluid and Coordinated No    Fine Motor Movements are Fluid and Coordinated No    9 Hole Peg Test Right;Left    Right 9 Hole Peg Test 36.41    Left 9 Hole Peg Test 25    Box and Blocks R-36blocks, L-55blocks    Coordination Pt demo delayed release of objects at times with R hand, pt also observed to  switch fingers for pinch with repetitive tasks (2-5th digits used with thumb)      ROM / Strength   AROM / PROM / Strength AROM;Strength      AROM   Overall AROM  Deficits    Overall AROM Comments UEs grossly WNL except mild decr R finger abduction, isolated MP flex with IPs extended      Strength   Overall Strength Unable to assess   proximal RUE strength due to time constraints, but grossly at least 3/5     Hand Function   Right Hand Grip (lbs)  64.1    Left Hand Grip (lbs) 77.1                              OT Education - 12/10/20 1359     Education Details OT eval results and POC    Person(s) Educated Patient    Methods Explanation    Comprehension Verbalized understanding              OT Short Term Goals - 12/10/20 1425       OT SHORT TERM GOAL #1   Title Pt will be independent with initial HEP for RUE strength/coordination.--check STGs 01/08/21    Time 4    Period Weeks    Status New      OT SHORT TERM GOAL #2   Title Pt will improve coordination/functional reaching with RUE as shown by improving score on box and blocks test by at least 5.    Baseline R-36 blocks    Time 4    Period Weeks    Status New      OT SHORT TERM GOAL #3   Title Pt will improve fine motor coordination for ADLs as shown by improving time on 9-hole peg test by at least 4 sec with dominant RUE.    Baseline 36.41sec    Time 4    Period Weeks    Status New      OT SHORT TERM GOAL #4   Title Pt will improve R grip strength by at least 5lbs to assist in gripping/lifting tasks.    Baseline 64.1lbs    Time 4    Period Weeks    Status New      OT SHORT TERM GOAL #5   Title --    Time --    Period --    Status --               OT Long Term Goals - 12/10/20 1429       OT LONG TERM GOAL #1   Title Pt will be independent with updated HEP for RUE strength/coordination.--check LTGs 02/08/21    Time 8    Period Weeks    Status New      OT LONG TERM GOAL  #2   Title Pt will verbalize understanding of adaptive strategies/AE to incr ease/safety with ADLs/IADls including precautions for sensory deficits and cognitive/visual compensation strategies.    Time 8    Period Weeks    Status New      OT LONG TERM GOAL #3   Title Pt will improve fine motor coordination for ADLs as shown by improving time on 9-hole peg test by at least 8 sec with dominant RUE.    Baseline 36.41sec    Time 8    Period Weeks    Status New      OT LONG TERM GOAL #4   Title Pt will improve coordination/functional reaching with RUE as shown by improving score on box and blocks test by at least 10.    Baseline 36    Time 8    Period Weeks    Status New      OT LONG TERM GOAL #5   Title Pt will verbalize understanding of visual and cognitive HEP prn.    Time 8    Period Weeks    Status New  Plan - 12/10/20 1401     Clinical Impression Statement Pt is a 40 y.o. female referred to occupational therapy with diagnosis of Grade III astrocytoma brain tumor (biopsy/surgery 12/08/19, and hx of radiation/chemotherapy).  Pt with recent hospitalization for seizures 10/28/20-11/01/20.  Pt reports that she has had some trouble with her R hand and speech since completing radiation/January 2022, but that difficulties incr since seizures/hospitalization.  Pt was performing BADLs mod I, but is currently having more trouble/has not yet resumed all IADLs, leisure, and work tasks.  Pt presents today with decr strength, decr coordination, decr sensation, visual deficits, and cogntive deficits, and decr balance.  Pt would benefit from occupational therapy to address these changes for improved RUE functional use, ADL/IADL performance.    OT Occupational Profile and History Detailed Assessment- Review of Records and additional review of physical, cognitive, psychosocial history related to current functional performance    Occupational performance deficits (Please refer to  evaluation for details): ADL's;IADL's;Work;Leisure    Body Structure / Function / Physical Skills ADL;Decreased knowledge of use of DME;Strength;Dexterity;Balance;Proprioception;UE functional use;IADL;ROM;Endurance;Vision;Sensation;Mobility;Coordination;FMC;Decreased knowledge of precautions    Cognitive Skills Attention;Memory    Rehab Potential Good    Clinical Decision Making Several treatment options, min-mod task modification necessary    Comorbidities Affecting Occupational Performance: May have comorbidities impacting occupational performance    Modification or Assistance to Complete Evaluation  Min-Moderate modification of tasks or assist with assess necessary to complete eval    OT Frequency 2x / week    OT Duration 8 weeks   +eval   OT Treatment/Interventions Self-care/ADL training;Moist Heat;Fluidtherapy;DME and/or AE instruction;Balance training;Therapeutic activities;Aquatic Therapy;Therapeutic exercise;Cognitive remediation/compensation;Visual/perceptual remediation/compensation;Functional Mobility Training;Neuromuscular education;Cryotherapy;Energy conservation;Manual Therapy;Patient/family education    Plan initiate HEP for R hand coordination, further assess vision    Consulted and Agree with Plan of Care Patient             Patient will benefit from skilled therapeutic intervention in order to improve the following deficits and impairments:   Body Structure / Function / Physical Skills: ADL, Decreased knowledge of use of DME, Strength, Dexterity, Balance, Proprioception, UE functional use, IADL, ROM, Endurance, Vision, Sensation, Mobility, Coordination, FMC, Decreased knowledge of precautions Cognitive Skills: Attention, Memory     Visit Diagnosis: Other lack of coordination  Muscle weakness (generalized)  Other disturbances of skin sensation  Visuospatial deficit  Attention and concentration deficit  Unsteadiness on feet    Problem List Patient Active  Problem List   Diagnosis Date Noted   Seizure ([place]) 10/28/2020   Primary malignant neoplasm of frontal lobe ([place]) 01/13/2020   Focal seizure ([place]) 12/07/2019   Astrocytoma of frontal lobe ([place]) 12/07/2019    Shabnam Ladd, OT/L 12/10/2020, 2:39 PM  [facility] [address] [place], [place], [address] Phone: [phone]   Fax:  [phone]  Name: Orvella Dushaj MRN: [id number] Date of Birth: Oct 19, 1980  Vianne Bulls, OTR/L [facility] [address] [place], [place]  [address] [phone] phone [phone] 12/10/20 2:39 PM

## 2020-12-10 NOTE — Therapy (Signed)
[facility] [address] [place], [place], [address] Phone: [phone]   Fax:  [phone]  Speech Language Pathology Evaluation  Patient Details  Name: Candace [name] MRN: [id number] Date of Birth: 25-May-1980 Referring Provider (SLP): Dr. Cecil Cobbs   Encounter Date: 12/10/2020   End of Session - 12/10/20 1616     Visit Number 1    Number of Visits 25    Date for SLP Re-Evaluation 03/04/21    Authorization Type 30 VL ST; 30 VL PT/OT    Authorization - Visit Number 1    Authorization - Number of Visits 30    SLP Start Time [id number]    SLP Stop Time  [id number]    SLP Time Calculation (min) 37 min    Activity Tolerance Patient tolerated treatment well;Other (comment)   fatigue evident at end of session but did not limit            Past Medical History:  Diagnosis Date   Brain tumor [facility])    Headache     Past Surgical History:  Procedure Laterality Date   ABDOMINAL SURGERY     APPLICATION OF CRANIAL NAVIGATION Left 12/08/2019   Procedure: APPLICATION OF CRANIAL NAVIGATION;  Surgeon: Vallarie Mare, MD;  Location: [place];  Service: Neurosurgery;  Laterality: Left;  APPLICATION OF CRANIAL NAVIGATION   FRAMELESS  BIOPSY WITH BRAINLAB Left 12/08/2019   Procedure: Left stereotactic brain biopsy with brainlab;  Surgeon: Vallarie Mare, MD;  Location: [place];  Service: Neurosurgery;  Laterality: Left;  Left stereotactic brain biopsy with brainlab   WISDOM TOOTH EXTRACTION      There were no vitals filed for this visit.   Subjective Assessment - 12/10/20 1324     Subjective "My speech is messed up"    Currently in Pain? No/denies                SLP Evaluation [facility] - 12/10/20 1324       SLP Visit Information   SLP Received On 12/10/20    Referring Provider (SLP) Dr. Cecil Cobbs    Onset Date 10/28/20    Medical Diagnosis Grade III Astrocytoma      Subjective   Patient/Family Stated Goal "Get  back to normal"      General Information   HPI Candace [name] was diagnosed with stage III Left temporal astrocytoma. In 11/2019 left frontal tumor, s/p XRT 01/17/20-03/02/20. Hospitalized recently 10/28/20 to 11/01/20 with seizure. MRI: Redemonstrated large infiltrating mass with associated edema in the  left frontal and parietal lobes, left insula/subinsular white  matter, posterior limb of left internal capsule and left temporal  lobe.      Prior Functional Status   Cognitive/Linguistic Baseline Within functional limits   returned to baseline after initial surgery 9/20201   Type of [place] Self employed      Cognition   Overall Cognitive Status Impaired/Different from baseline    Area of Impairment Attention;Memory;Problem solving    Attention Sustained    Memory Impaired    Memory Impairment Storage deficit;Decreased short term memory;Decreased recall of new information    Awareness Appears intact      Auditory Comprehension   Overall Auditory Comprehension Appears within functional limits for tasks assessed      Reading Comprehension   Reading Status Impaired    Word level 76-100% accurate    Sentence Level 76-100% accurate  Paragraph Level Not tested    Functional Environmental (signs, name badge) Within functional limits    Interfering Components Attention;Processing time;Visual perceptual      Expression   Primary Mode of Expression Verbal      Verbal Expression   Overall Verbal Expression Impaired    Initiation Impaired    Automatic Speech Day of week;Month of year   1 error in each "taturday" Octus/august   Level of Generative/Spontaneous Verbalization Conversation    Repetition No impairment   slow to compensate   Naming Impairment    Responsive 76-100% accurate    Confrontation 75-100% accurate    Convergent Not tested    Divergent 75-100% accurate    Verbal Errors Phonemic paraphasias    Pragmatics No impairment    Interfering  Components Attention      Written Expression   Dominant Hand Right    Written Expression Exceptions to [facility]    Dictation Ability Sentence   slow   Self Formulation Ability Sentence   reports leaving out words and other errors on text. Has spouse proof on occasion     Oral Motor/Sensory Function   Overall Oral Motor/Sensory Function Impaired    Labial ROM Within Functional Limits    Labial Symmetry Within Functional Limits    Labial Strength Reduced Right    Labial Coordination WFL    Lingual ROM Within Functional Limits    Facial Symmetry Within Functional Limits      Motor Speech   Overall Motor Speech Impaired    Respiration Within functional limits    Phonation Normal    Resonance Within functional limits    Articulation Within functional limitis    Intelligibility Intelligible    Motor Planning Impaired    Level of Impairment Sentence    Motor Speech Errors Inconsistent;Aware    Effective Techniques Slow rate;Pause      Standardized Assessments   Standardized Assessments  --   Quick Aphasia Battery 8.83/10 mild                            SLP Education - 12/10/20 1610     Education Details Oral reading HEP for verbal apraxia    Person(s) Educated Patient    Methods Explanation;Verbal cues;Handout    Comprehension Verbalized understanding;Verbal cues required;Need further instruction              SLP Short Term Goals - 12/10/20 1725       SLP SHORT TERM GOAL #1   Title Pt will be independent in HEP for verbal apraxia over 3 sessions    Time 6    Period Weeks    Status New      SLP SHORT TERM GOAL #2   Title Pt will ID and self correct aphasic or apraxic errors in structured speech tasks and simple conversation 4/5 opportunities with occasional  min A    Time 6    Period Weeks    Status New      SLP SHORT TERM GOAL #3   Title Pt will carryover 2 strategies for attention to read and comprehend 3 paragraph passage with occasional min A     Time 6    Period Weeks    Status New      SLP SHORT TERM GOAL #4   Title Pt will use external aids to recall daily tasks to report forgetting 3 or less tasks a week for 2 weeks  Time 6    Period Weeks    Status New              SLP Long Term Goals - 12/10/20 1730       SLP LONG TERM GOAL #1   Title Pt will use WNL rate of speech in simple conversation over 5 minutes with occasional min A over 2 sessions    Time 12    Period Weeks    Status New      SLP LONG TERM GOAL #2   Title Pt will ID and self correct all aphasic and apraxic errors during 15 minute conversation with rare min A    Time 12    Period Weeks    Status New      SLP LONG TERM GOAL #3   Title Pt will improve score on The Communicative Participation Item Bank Short Form by 3 points (original score 13)    Time 12    Period Weeks    Status New      SLP LONG TERM GOAL #4   Title Pt will carryover compensations for slow processing, attention to participate in small group conversation successfully 3x outside of ST per her report    Time 12    Period Weeks    Status New      SLP LONG TERM GOAL #5   Title Pt will correct grammar/aphasic errors on texts with mod I over 3 sessions    Time [place] - 12/10/20 1705     Clinical Impression Statement Candace [name] is referred for outpt ST due to cognitive and language impairments. Hx of Left frontal and temporal brain tumor. Recent hospitalization due to seizures.Her ST concerns are speech impairment forgetting to do household tasks, difficulty reading (likely due to vision and attention), difficulty texting (fine motor and leaving out words). Today she scored a 13 on The Communicative Participation Item Bank - General Short Form. Candace [name] indicated the most difficulty saying somethink quickly and getting a turn in a fast moving conversation. She also indicated difficulty communicating in the community, asking  questions in a conversation, & communiating in a group conversation. The Quick Aphasia Battery revealed mild aphasia. She also presents with mild verbal apraxia with halting/groping on verbal aglity tasks and rapid alternating speech tasks. Candace [name] named 17 animals in 1 minute - WNL and named 3 "m" words in 1 minute - subWNL. Hesitations noted on naming and repetition of multisyllabic words with phonemic paraphasias vs apraxic errors (hambit/hammock; element/elephant; ibama/iguana)Candace [name] spontaneously uses slow rate and syllables to compensate for verbal apraxia. She reports she is not as social due to speech impairment and she used to talk to people in stores which she avoids now. Candace [name] endorses some drool and some right buccal residue after meals. 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 I  recommend skilled ST to maximize speech intelligiblity, language and cognition for independnece, participation in social interactions/activities and QOL.    Speech Therapy Frequency 2x / week    Duration 12 weeks    Treatment/Interventions Language facilitation;Environmental controls;Cueing hierarchy;Compensatory strategies;Functional tasks;Cognitive reorganization;Compensatory techniques;Patient/family education;Multimodal communcation approach;Internal/external aids;SLP instruction and feedback    Potential to Achieve Goals Good             Patient will benefit from skilled therapeutic intervention in order to improve the following  deficits and impairments:   Aphasia  Verbal apraxia  Cognitive communication deficit    Problem List Patient Active Problem List   Diagnosis Date Noted   Seizure ([place]) 10/28/2020   Primary malignant neoplasm of frontal lobe ([place]) 01/13/2020   Focal seizure ([place]) 12/07/2019   Astrocytoma of frontal lobe ([place]) 12/07/2019    Purl Claytor, [place], [place] 12/10/2020, 5:37 PM  [facility] [address] [place], [place], [address] Phone: [phone]   Fax:  [phone]  Name: Duha Hubbs MRN: [id number] Date of Birth: 02/27/1981

## 2020-12-10 NOTE — Patient Instructions (Signed)
   Large print - use focus card or book mark or index card  Read aloud 10 minutes twice a day

## 2020-12-11 ENCOUNTER — Other Ambulatory Visit: Payer: Self-pay | Admitting: Internal Medicine

## 2020-12-11 ENCOUNTER — Telehealth: Payer: Self-pay | Admitting: Internal Medicine

## 2020-12-11 DIAGNOSIS — C711 Malignant neoplasm of frontal lobe: Secondary | ICD-10-CM

## 2020-12-11 NOTE — Telephone Encounter (Signed)
Scheduled per sch msg. Called and left msg  

## 2020-12-17 ENCOUNTER — Encounter: Payer: Self-pay | Admitting: Occupational Therapy

## 2020-12-17 ENCOUNTER — Ambulatory Visit: Payer: BC Managed Care – PPO | Admitting: Occupational Therapy

## 2020-12-17 ENCOUNTER — Encounter: Payer: Self-pay | Admitting: Speech Pathology

## 2020-12-17 ENCOUNTER — Ambulatory Visit: Payer: BC Managed Care – PPO | Admitting: Speech Pathology

## 2020-12-17 ENCOUNTER — Other Ambulatory Visit: Payer: Self-pay

## 2020-12-17 DIAGNOSIS — R482 Apraxia: Secondary | ICD-10-CM

## 2020-12-17 DIAGNOSIS — R41842 Visuospatial deficit: Secondary | ICD-10-CM

## 2020-12-17 DIAGNOSIS — R278 Other lack of coordination: Secondary | ICD-10-CM

## 2020-12-17 DIAGNOSIS — R4701 Aphasia: Secondary | ICD-10-CM

## 2020-12-17 DIAGNOSIS — R2681 Unsteadiness on feet: Secondary | ICD-10-CM

## 2020-12-17 DIAGNOSIS — R41840 Attention and concentration deficit: Secondary | ICD-10-CM

## 2020-12-17 DIAGNOSIS — M6281 Muscle weakness (generalized): Secondary | ICD-10-CM

## 2020-12-17 DIAGNOSIS — R41841 Cognitive communication deficit: Secondary | ICD-10-CM

## 2020-12-17 DIAGNOSIS — R208 Other disturbances of skin sensation: Secondary | ICD-10-CM

## 2020-12-17 NOTE — Therapy (Signed)
[facility] [address] [place], [place], [address] Phone: [phone]   Fax:  [phone]  Occupational Therapy Treatment  Patient Details  Name: Candace [name] MRN: [id number] Date of Birth: [birth year] Referring Provider (OT): Dr. Cecil Cobbs   Encounter Date: 12/17/2020   OT End of Session - 12/17/20 1026     Visit Number 2    Number of Visits 17    Date for OT Re-Evaluation 02/08/21    Authorization Type [employer]--covered 100%, 30 visit limit OT/PT, 30 speech, no auth req.    OT Start Time 1022    OT Stop Time 1100    OT Time Calculation (min) 38 min    Activity Tolerance Patient tolerated treatment well    Behavior During Therapy WFL for tasks assessed/performed             Past Medical History:  Diagnosis Date   Brain tumor [facility])    Headache     Past Surgical History:  Procedure Laterality Date   ABDOMINAL SURGERY     APPLICATION OF CRANIAL NAVIGATION Left 12/08/2019   Procedure: APPLICATION OF CRANIAL NAVIGATION;  Surgeon: Vallarie Mare, MD;  Location: [facility];  Service: Neurosurgery;  Laterality: Left;  APPLICATION OF CRANIAL NAVIGATION   FRAMELESS  BIOPSY WITH BRAINLAB Left 12/08/2019   Procedure: Left stereotactic brain biopsy with brainlab;  Surgeon: Vallarie Mare, MD;  Location: [place];  Service: Neurosurgery;  Laterality: Left;  Left stereotactic brain biopsy with brainlab   WISDOM TOOTH EXTRACTION      There were no vitals filed for this visit.   Subjective Assessment - 12/17/20 1023     Subjective  My hand is worse.  I was on a steriod and it takes a couple of weeks to work out of my body.  My hand seems worse the last few days.  Putting on my eye make-up was a struggle.  Next MRI is end of October.  Writing and eating are harder.  Been texting more with my L hand.    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  PMH:  hx of radiation/chemotherapy  (03/02/20), hx of migraines, hx of seizures (hospitalized 10/28/20-11/01/20)    Limitations hx of seizures    Patient Stated Goals improve RUE functional use    Currently in Pain? No/denies                                  OT Education - 12/17/20 1027     Education Details Coordination HEP--see pt instructions    Person(s) Educated Patient    Methods Explanation;Demonstration;Verbal cues;Handout    Comprehension Verbalized understanding;Returned demonstration;Verbal cues required              OT Short Term Goals - 12/10/20 1425       OT SHORT TERM GOAL #1   Title Pt will be independent with initial HEP for RUE strength/coordination.--check STGs 01/08/21    Time 4    Period Weeks    Status New      OT SHORT TERM GOAL #2   Title Pt will improve coordination/functional reaching with RUE as shown by improving score on box and blocks test by at least 5.    Baseline R-36 blocks    Time 4    Period Weeks    Status New      OT SHORT TERM GOAL #3  Title Pt will improve fine motor coordination for ADLs as shown by improving time on 9-hole peg test by at least 4 sec with dominant RUE.    Baseline 36.41sec    Time 4    Period Weeks    Status New      OT SHORT TERM GOAL #4   Title Pt will improve R grip strength by at least 5lbs to assist in gripping/lifting tasks.    Baseline 64.1lbs    Time 4    Period Weeks    Status New      OT SHORT TERM GOAL #5   Title --    Time --    Period --    Status --               OT Long Term Goals - 12/10/20 1429       OT LONG TERM GOAL #1   Title Pt will be independent with updated HEP for RUE strength/coordination.--check LTGs 02/08/21    Time 8    Period Weeks    Status New      OT LONG TERM GOAL #2   Title Pt will verbalize understanding of adaptive strategies/AE to incr ease/safety with ADLs/IADls including precautions for sensory deficits and cognitive/visual compensation strategies.    Time 8     Period Weeks    Status New      OT LONG TERM GOAL #3   Title Pt will improve fine motor coordination for ADLs as shown by improving time on 9-hole peg test by at least 8 sec with dominant RUE.    Baseline 36.41sec    Time 8    Period Weeks    Status New      OT LONG TERM GOAL #4   Title Pt will improve coordination/functional reaching with RUE as shown by improving score on box and blocks test by at least 10.    Baseline 36    Time 8    Period Weeks    Status New      OT LONG TERM GOAL #5   Title Pt will verbalize understanding of visual and cognitive HEP prn.    Time 8    Period Weeks    Status New                   Plan - 12/17/20 1022     Clinical Impression Statement Pt reports incr difficulty with R hand due to coming off the steriod.  Pt demo improvement with coordination activities with cueing and repetition.  Pt verbalized understanding of coordination HEP after instruction.    OT Occupational Profile and History Detailed Assessment- Review of Records and additional review of physical, cognitive, psychosocial history related to current functional performance    Occupational performance deficits (Please refer to evaluation for details): ADL's;IADL's;Work;Leisure    Body Structure / Function / Physical Skills ADL;Decreased knowledge of use of DME;Strength;Dexterity;Balance;Proprioception;UE functional use;IADL;ROM;Endurance;Vision;Sensation;Mobility;Coordination;FMC;Decreased knowledge of precautions    Cognitive Skills Attention;Memory    Rehab Potential Good    Clinical Decision Making Several treatment options, min-mod task modification necessary    Comorbidities Affecting Occupational Performance: May have comorbidities impacting occupational performance    Modification or Assistance to Complete Evaluation  Min-Moderate modification of tasks or assist with assess necessary to complete eval    OT Frequency 2x / week    OT Duration 8 weeks   +eval   OT  Treatment/Interventions Self-care/ADL training;Moist Heat;Fluidtherapy;DME and/or AE instruction;Balance training;Therapeutic activities;Aquatic Therapy;Therapeutic exercise;Cognitive  remediation/compensation;Visual/perceptual remediation/compensation;Functional Mobility Training;Neuromuscular education;Cryotherapy;Energy conservation;Manual Therapy;Patient/family education    Plan review HEP for R hand coordination    Consulted and Agree with Plan of Care Patient             Patient will benefit from skilled therapeutic intervention in order to improve the following deficits and impairments:   Body Structure / Function / Physical Skills: ADL, Decreased knowledge of use of DME, Strength, Dexterity, Balance, Proprioception, UE functional use, IADL, ROM, Endurance, Vision, Sensation, Mobility, Coordination, FMC, Decreased knowledge of precautions Cognitive Skills: Attention, Memory     Visit Diagnosis: Other lack of coordination  Muscle weakness (generalized)  Other disturbances of skin sensation  Visuospatial deficit  Attention and concentration deficit  Unsteadiness on feet    Problem List Patient Active Problem List   Diagnosis Date Noted   Seizure ([facility]) 10/28/2020   Primary malignant neoplasm of frontal lobe ([place]) 01/13/2020   Focal seizure ([place]) 12/07/2019   Astrocytoma of frontal lobe ([place]) 12/07/2019    Kayce Betty, OT/L 12/17/2020, 2:10 PM  [facility] [address] [place], [place], [address] Phone: [phone]   Fax:  [phone]  Name: Candace [name] MRN: [id number] Date of Birth: 03-Sep-1980  Vianne Bulls, OTR/L [facility] [address] [place], [place]  [address] [phone] phone [phone] 12/17/20 2:10 PM

## 2020-12-17 NOTE — Therapy (Signed)
[facility] [address] [place], [place], [address] Phone: [phone]   Fax:  [phone]  Speech Language Pathology Treatment  Patient Details  Name: Candace [name] MRN: [id number] Date of Birth: 10-09-1980 Referring Provider (SLP): Dr. Cecil Cobbs   Encounter Date: 12/17/2020   End of Session - 12/17/20 1036     Visit Number 2    Number of Visits 25    Date for SLP Re-Evaluation 03/04/21    Authorization Type 30 VL ST; 30 VL PT/OT    Authorization - Visit Number 2    Authorization - Number of Visits 30    SLP Start Time [phone]    SLP Stop Time  1015    SLP Time Calculation (min) 41 min    Activity Tolerance Patient tolerated treatment well             Past Medical History:  Diagnosis Date   Brain tumor [facility])    Headache     Past Surgical History:  Procedure Laterality Date   ABDOMINAL SURGERY     APPLICATION OF CRANIAL NAVIGATION Left 12/08/2019   Procedure: APPLICATION OF CRANIAL NAVIGATION;  Surgeon: Vallarie Mare, MD;  Location: [facility];  Service: Neurosurgery;  Laterality: Left;  APPLICATION OF CRANIAL NAVIGATION   FRAMELESS  BIOPSY WITH BRAINLAB Left 12/08/2019   Procedure: Left stereotactic brain biopsy with brainlab;  Surgeon: Vallarie Mare, MD;  Location: [facility];  Service: Neurosurgery;  Laterality: Left;  Left stereotactic brain biopsy with brainlab   WISDOM TOOTH EXTRACTION      There were no vitals filed for this visit.   Subjective Assessment - 12/17/20 0937     Subjective "I feel like my hand got worse over the weekend so that is disturbing"    Currently in Pain? No/denies                   ADULT SLP TREATMENT - 12/17/20 0937       General Information   Behavior/Cognition Alert;Cooperative;Pleasant mood      Treatment Provided   Treatment provided Cognitive-Linquistic      Cognitive-Linquistic Treatment   Treatment focused on Cognition;Aphasia;Apraxia     Skilled Treatment Candace [name] reports she has used to do list with success to complete her priority tasks for the day or week. She has carried over HEP of reading aloud. Educated her re: energy conservation. She went to dinner and had difficulty participating in conversation. Educated her re: more difficulty with group conversations and in noisy environments. She also report avoiding phone calls. Trained Candace [name] in environmental strategies of reducing background noise, not to multi task when conversting and pre-scripting, writing down key words or questions proir to making the phone calls. Targeted vebal apraxia with repetition and sentnece generation of multisyllabic words with slow rate and syllables. Candace [name] self corrected errors 7/7 sentences and repetitions. Complex word finding targeted with personally relevant category (plants/flowers) and given letters - Candace [name] requried extended time and mod A correcting spelling errors 1x, generated 8 words. Added to HEP for verbal apraxia, she will complete written naming task for [facility]      Assessment / Recommendations / [place] with current plan of care      Progression Toward Goals   Progression toward goals Progressing toward goals              SLP Education - 12/17/20 1030     Education Details environmental compensations for aphasia and  verbal apraxia with conversations and phone calls    Person(s) Educated Patient    Methods Explanation;Verbal cues;Handout    Comprehension Verbalized understanding;Returned demonstration;Verbal cues required;Need further instruction              SLP Short Term Goals - 12/17/20 1035       SLP SHORT TERM GOAL #1   Title Pt will be independent in HEP for verbal apraxia over 3 sessions    Time 6    Period Weeks    Status On-going      SLP SHORT TERM GOAL #2   Title Pt will ID and self correct aphasic or apraxic errors in structured speech tasks and simple conversation 4/5 opportunities with  occasional  min A    Time 6    Period Weeks    Status On-going      SLP SHORT TERM GOAL #3   Title Pt will carryover 2 strategies for attention to read and comprehend 3 paragraph passage with occasional min A    Time 6    Period Weeks    Status On-going      SLP SHORT TERM GOAL #4   Title Pt will use external aids to recall daily tasks to report forgetting 3 or less tasks a week for 2 weeks    Time 6    Period Weeks    Status On-going              SLP Long Term Goals - 12/17/20 1036       SLP LONG TERM GOAL #1   Title Pt will use WNL rate of speech in simple conversation over 5 minutes with occasional min A over 2 sessions    Time 12    Period Weeks    Status On-going      SLP LONG TERM GOAL #2   Title Pt will ID and self correct all aphasic and apraxic errors during 15 minute conversation with rare min A    Time 12    Period Weeks    Status On-going      SLP LONG TERM GOAL #3   Title Pt will improve score on The Communicative Participation Item Bank Short Form by 3 points (original score 13)    Time 12    Period Weeks    Status On-going      SLP LONG TERM GOAL #4   Title Pt will carryover compensations for slow processing, attention to participate in small group conversation successfully 3x outside of ST per her report    Time 12    Period Weeks    Status On-going      SLP LONG TERM GOAL #5   Title Pt will correct grammar/aphasic errors on texts with mod I over 3 sessions    Time 12    Period Weeks    Status On-going              Plan - 12/17/20 1031     Clinical Impression Statement Candace [name] conitnues to present with mild aphasia, 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 She endorses avoiding phone calls, however she did socialize out to dinner with friends. Initiated training in environmental and external compensatory strategies for aphasia/apraxia and cognition. See skilled intervention.  She initiated to do list with success in recalling daily tasks. Continue skilled ST to maximize intelligibility, language and cognition for independence, participation in social interactions and QOL    Speech Therapy Frequency  2x / week    Duration 12 weeks    Treatment/Interventions Language facilitation;Environmental controls;Cueing hierarchy;Compensatory strategies;Functional tasks;Cognitive reorganization;Compensatory techniques;Patient/family education;Multimodal communcation approach;Internal/external aids;SLP instruction and feedback    Potential to Achieve Goals Good             Patient will benefit from skilled therapeutic intervention in order to improve the following deficits and impairments:   Aphasia  Verbal apraxia  Cognitive communication deficit    Problem List Patient Active Problem List   Diagnosis Date Noted   Seizure ([facility]) 10/28/2020   Primary malignant neoplasm of frontal lobe ([facility]) 01/13/2020   Focal seizure ([place]) 12/07/2019   Astrocytoma of frontal lobe ([facility]) 12/07/2019    Candace [name], [place], [place] 12/17/2020, 10:37 AM  [facility] [address] [place], [place], [address] Phone: [phone]   Fax:  [phone]   Name: Rylinn Hishmeh MRN: [id number] Date of Birth: [birth year]

## 2020-12-17 NOTE — Patient Instructions (Addendum)
    Coordination Activities  Perform the following activities for 20 minutes 1-2 times per day with right hand(s).  Rotate ball in fingertips (clockwise and counter-clockwise). Toss ball between hands. Toss ball in air and catch with the same hand. Flip cards 1 at a time.  Open your hand fully before picking up card.  Relax shoulder Deal cards with your thumb (Hold deck in hand and push card off top with thumb).  Make sure you bring thumb back. Shuffle cards. Pick up coins, buttons, marbles, paperclips, beads, dried beans/pasta of different sizes and place in container. Pick up coins and place in container or coin bank. Pick up coins and stack. Pick up coins one at a time until you get 5-10 in your hand, then move coins from palm to fingertips to stack one at a time.

## 2020-12-17 NOTE — Patient Instructions (Addendum)
  Group conversations may be harder to process and participate in. They are fast moving and topics change quickly  Try to find quieter restaurants - good luck  Turn down any noise in the environment such as the TV, walk away from loud appliances, air conditioners, fans, dish washers etc  Great job self advertising that you are having trouble talking at the beginning of phone calls or conversation  Feel free to write down what you want to say, even just a few key words, before you make a phone call  Make sure the noise is down when you make phone calls  When you have an event in the evening, make sure you rest during the day.   Feel free to pre-script, pre-practice or even mentally rehearse pertinent information about friends you are seeing (kids names, jobs, recent trips etc) prior to getting together to take a little cognitive load off of your brain  Try to avoid interruptions/distractions when you are at the stove or have more than 1 thing going on with the stove or oven

## 2020-12-18 ENCOUNTER — Encounter: Payer: Self-pay | Admitting: Internal Medicine

## 2020-12-24 ENCOUNTER — Ambulatory Visit: Payer: BC Managed Care – PPO

## 2020-12-24 ENCOUNTER — Encounter: Payer: BC Managed Care – PPO | Admitting: Occupational Therapy

## 2020-12-24 ENCOUNTER — Other Ambulatory Visit: Payer: Self-pay

## 2020-12-24 DIAGNOSIS — R4701 Aphasia: Secondary | ICD-10-CM

## 2020-12-24 DIAGNOSIS — R278 Other lack of coordination: Secondary | ICD-10-CM | POA: Diagnosis not present

## 2020-12-24 DIAGNOSIS — R482 Apraxia: Secondary | ICD-10-CM

## 2020-12-24 DIAGNOSIS — R41841 Cognitive communication deficit: Secondary | ICD-10-CM

## 2020-12-24 NOTE — Therapy (Signed)
[facility] [address] [place], [place], [address] Phone: [phone]   Fax:  [phone]  Speech Language Pathology Treatment  Patient Details  Name: Candace [name] MRN: [id number] Date of Birth: 1980/07/31 Referring Provider (SLP): Dr. Cecil Cobbs   Encounter Date: 12/24/2020   End of Session - 12/24/20 1645     Visit Number 3    Number of Visits 25    Date for SLP Re-Evaluation 03/04/21    Authorization Type 30 VL ST; 30 VL PT/OT    Authorization - Visit Number 3    Authorization - Number of Visits 30    SLP Start Time 2025    SLP Stop Time  1400    SLP Time Calculation (min) 43 min    Activity Tolerance Patient tolerated treatment well             Past Medical History:  Diagnosis Date   Brain tumor [facility])    Headache     Past Surgical History:  Procedure Laterality Date   ABDOMINAL SURGERY     APPLICATION OF CRANIAL NAVIGATION Left 12/08/2019   Procedure: APPLICATION OF CRANIAL NAVIGATION;  Surgeon: Vallarie Mare, MD;  Location: [facility];  Service: Neurosurgery;  Laterality: Left;  APPLICATION OF CRANIAL NAVIGATION   FRAMELESS  BIOPSY WITH BRAINLAB Left 12/08/2019   Procedure: Left stereotactic brain biopsy with brainlab;  Surgeon: Vallarie Mare, MD;  Location: [facility];  Service: Neurosurgery;  Laterality: Left;  Left stereotactic brain biopsy with brainlab   WISDOM TOOTH EXTRACTION      There were no vitals filed for this visit.   Subjective Assessment - 12/24/20 1636     Subjective "I have pain in my right hand."    Currently in Pain? Yes    Pain Score 2     Pain Location Hand    Pain Orientation Right    Pain Descriptors / Indicators Numbness;Aching    Pain Type Acute pain    Pain Frequency Intermittent                   ADULT SLP TREATMENT - 12/24/20 1637       General Information   Behavior/Cognition Alert;Cooperative;Pleasant mood      Treatment Provided    Treatment provided Cognitive-Linquistic      Cognitive-Linquistic Treatment   Treatment focused on Cognition;Aphasia;Apraxia    Skilled Treatment Asmi recalled 4 items from previous ST session correctly, including discussion of some compensations for attention and for anomia and verbal apraxia. Candace [name] told SLP she is prioritizing and conserving stamina for talk time by asking husband to make non-pertinent calls for her. She also gave example of doing something social later in the afternoon/early evening and purposely limiting talking that day to be able to talk more/longer during the social event, which was 5 people instead of a larger group (3rd compensation pt used). SLP told pt he characterized her speech as "mildly halting".  Pt stated she feels at times that she is "annoying" people/listeners by her speech pattern, which she acknowledged then further decreases her success at producing more fluid/fluent speech. SLP discussed this with pt at length and assisted pt in coming up with a positive script for self talk instead of "annoying" other people (encouraging or inspiring other people when she talks given all she has been through and is going through). Pt to use this script when she feels she is going to use her negative script of "annoying" people  with her speech pattern.      Assessment / Recommendations / Plan   Plan Continue with current plan of care      Progression Toward Goals   Progression toward goals Progressing toward goals              SLP Education - 12/24/20 1645     Education Details positive script/self talk for her speech pattern    Person(s) Educated Patient    Methods Explanation    Comprehension Verbalized understanding              SLP Short Term Goals - 12/24/20 1647       SLP SHORT TERM GOAL #1   Title Pt will be independent in HEP for verbal apraxia over 3 sessions    Time 5    Period Weeks    Status On-going      SLP SHORT TERM GOAL #2   Title  Pt will ID and self correct aphasic or apraxic errors in structured speech tasks and simple conversation 4/5 opportunities with occasional  min A    Time 5    Period Weeks    Status On-going      SLP SHORT TERM GOAL #3   Title Pt will carryover 2 strategies for attention to read and comprehend 3 paragraph passage with occasional min A    Time 5    Period Weeks    Status On-going      SLP SHORT TERM GOAL #4   Title Pt will use external aids to recall daily tasks to report forgetting 3 or less tasks a week for 2 weeks    Time 5    Period Weeks    Status On-going              SLP Long Term Goals - 12/24/20 1647       SLP LONG TERM GOAL #1   Title Pt will use WNL rate of speech in simple conversation over 5 minutes with occasional min A over 2 sessions    Time 11    Period Weeks    Status On-going      SLP LONG TERM GOAL #2   Title Pt will ID and self correct all aphasic and apraxic errors during 15 minute conversation with rare min A    Time 11    Period Weeks    Status On-going      SLP LONG TERM GOAL #3   Title Pt will improve score on The Communicative Participation Item Bank Short Form by 3 points (original score 13)    Time 11    Period Weeks    Status On-going      SLP LONG TERM GOAL #4   Title Pt will carryover compensations for slow processing, attention to participate in small group conversation successfully 3x outside of ST per her report    Time 11    Period Weeks    Status On-going      SLP LONG TERM GOAL #5   Title Pt will correct grammar/aphasic errors on texts with mod I over 3 sessions    Time 11    Period Weeks    Status On-going              Plan - 12/24/20 1646     Clinical Impression Statement Candace [name] conitnues to present with mild aphasia, 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 She endorses using environmental and external compensatory  strategies for aphasia/apraxia and  cognition. See skilled intervention for  more details. Recommend continued skilled ST to maximize intelligibility, language and cognition for independence, participation in social interactions and QOL    Speech Therapy Frequency 2x / week    Duration 12 weeks    Treatment/Interventions Language facilitation;Environmental controls;Cueing hierarchy;Compensatory strategies;Functional tasks;Cognitive reorganization;Compensatory techniques;Patient/family education;Multimodal communcation approach;Internal/external aids;SLP instruction and feedback    Potential to Achieve Goals Good             Patient will benefit from skilled therapeutic intervention in order to improve the following deficits and impairments:   Verbal apraxia  Aphasia  Cognitive communication deficit    Problem List Patient Active Problem List   Diagnosis Date Noted   Seizure ([place]) 10/28/2020   Primary malignant neoplasm of frontal lobe ([place]) 01/13/2020   Focal seizure ([place]) 12/07/2019   Astrocytoma of frontal lobe ([place]) 12/07/2019    [place] ,[place], [place]  12/24/2020, 4:48 PM  [facility] [address] [place], [place], [address] Phone: [phone]   Fax:  [phone]   Name: Candace [name] MRN: [id number] Date of Birth: [birth year]

## 2020-12-26 ENCOUNTER — Ambulatory Visit: Payer: BC Managed Care – PPO | Admitting: Occupational Therapy

## 2020-12-26 ENCOUNTER — Other Ambulatory Visit: Payer: Self-pay

## 2020-12-26 ENCOUNTER — Ambulatory Visit: Payer: BC Managed Care – PPO

## 2020-12-26 DIAGNOSIS — R482 Apraxia: Secondary | ICD-10-CM

## 2020-12-26 DIAGNOSIS — R41840 Attention and concentration deficit: Secondary | ICD-10-CM

## 2020-12-26 DIAGNOSIS — R41841 Cognitive communication deficit: Secondary | ICD-10-CM

## 2020-12-26 DIAGNOSIS — M6281 Muscle weakness (generalized): Secondary | ICD-10-CM

## 2020-12-26 DIAGNOSIS — R278 Other lack of coordination: Secondary | ICD-10-CM

## 2020-12-26 DIAGNOSIS — R208 Other disturbances of skin sensation: Secondary | ICD-10-CM

## 2020-12-26 DIAGNOSIS — R4701 Aphasia: Secondary | ICD-10-CM

## 2020-12-26 DIAGNOSIS — R41842 Visuospatial deficit: Secondary | ICD-10-CM

## 2020-12-26 DIAGNOSIS — R2681 Unsteadiness on feet: Secondary | ICD-10-CM

## 2020-12-26 NOTE — Therapy (Signed)
[facility] [address] [place], [place], [address] Phone: [phone]   Fax:  [phone]  Speech Language Pathology Treatment  Patient Details  Name: Candace [name] MRN: [id number] Date of Birth: [birth year] Referring Provider (SLP): Dr. Cecil Cobbs   Encounter Date: 12/26/2020   End of Session - 12/26/20 1309     Visit Number 4    Number of Visits 25    Date for SLP Re-Evaluation 03/04/21    Authorization Type 30 VL ST; 30 VL PT/OT    Authorization - Visit Number 4    Authorization - Number of Visits 30    SLP Start Time 1150    SLP Stop Time  1230    SLP Time Calculation (min) 40 min    Activity Tolerance Patient tolerated treatment well             Past Medical History:  Diagnosis Date   Brain tumor [facility])    Headache     Past Surgical History:  Procedure Laterality Date   ABDOMINAL SURGERY     APPLICATION OF CRANIAL NAVIGATION Left 12/08/2019   Procedure: APPLICATION OF CRANIAL NAVIGATION;  Surgeon: Vallarie Mare, MD;  Location: [facility];  Service: Neurosurgery;  Laterality: Left;  APPLICATION OF CRANIAL NAVIGATION   FRAMELESS  BIOPSY WITH BRAINLAB Left 12/08/2019   Procedure: Left stereotactic brain biopsy with brainlab;  Surgeon: Vallarie Mare, MD;  Location: [facility];  Service: Neurosurgery;  Laterality: Left;  Left stereotactic brain biopsy with brainlab   WISDOM TOOTH EXTRACTION      There were no vitals filed for this visit.          ADULT SLP TREATMENT - 12/26/20 1213       General Information   Behavior/Cognition Alert;Cooperative;Pleasant mood      Treatment Provided   Treatment provided Cognitive-Linquistic      Cognitive-Linquistic Treatment   Treatment focused on Cognition;Aphasia;Apraxia    Skilled Treatment Pt conveys to SLP she is constantly monitoring for linguisitc errors in both spoken (conversation) and written language (texting). SLP engaged pt in  conversation for 8 minutes with mild-mod halting speech, some noted apraxic errors but pt intelligible, successful, and functional in conveying intended message. SLP then worked with pt on her HEP (rhyming sentences) with pt success increasing when using a reading window. Erandi said she liked the one she had today better than the other one with the yellow translucent screen. SLP encouraged her to cont using the one she used today. Conversation last 10 minutes was identical to initial 8 minutes of ST with pt spontaneously using slowed rate.      Assessment / Recommendations / Plan   Plan Continue with current plan of care      Progression Toward Goals   Progression toward goals Progressing toward goals                SLP Short Term Goals - 12/26/20 1157       SLP SHORT TERM GOAL #1   Title Pt will be independent in HEP for verbal apraxia over 3 sessions    Time 5    Period Weeks    Status On-going      SLP SHORT TERM GOAL #2   Title Pt will ID and self correct aphasic or apraxic errors in structured speech tasks and simple conversation 4/5 opportunities with occasional  min A    Time 5    Period Weeks    Status  On-going      SLP SHORT TERM GOAL #3   Title Pt will carryover 2 strategies for attention to read and comprehend 3 paragraph passage with occasional min A    Time 5    Period Weeks    Status On-going      SLP SHORT TERM GOAL #4   Title Pt will use external aids to recall daily tasks to report forgetting 3 or less tasks a week for 2 weeks    Time 5    Period Weeks    Status On-going              SLP Long Term Goals - 12/26/20 1310       SLP LONG TERM GOAL #1   Title Pt will use WNL rate of speech in simple conversation over 5 minutes with occasional min A over 2 sessions    Time 11    Period Weeks    Status On-going      SLP LONG TERM GOAL #2   Title Pt will ID and self correct all aphasic and apraxic errors during 15 minute conversation with rare min  A    Time 11    Period Weeks    Status On-going      SLP LONG TERM GOAL #3   Title Pt will improve score on The Communicative Participation Item Bank Short Form by 3 points (original score 13)    Time 11    Period Weeks    Status On-going      SLP LONG TERM GOAL #4   Title Pt will carryover compensations for slow processing, attention to participate in small group conversation successfully 3x outside of ST per her report    Time 11    Period Weeks    Status On-going      SLP LONG TERM GOAL #5   Title Pt will correct grammar/aphasic errors on texts with mod I over 3 sessions    Time 11    Period Weeks    Status On-going              Plan - 12/26/20 1309     Clinical Impression Statement [name] to present with mild aphasia, 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 She endorses using environmental and external compensatory strategies for aphasia/apraxia and cognition. See skilled intervention for  more details. Recommend continued skilled ST to maximize intelligibility, language and cognition for independence, participation in social interactions and QOL    Speech Therapy Frequency 2x / week    Duration 12 weeks    Treatment/Interventions Language facilitation;Environmental controls;Cueing hierarchy;Compensatory strategies;Functional tasks;Cognitive reorganization;Compensatory techniques;Patient/family education;Multimodal communcation approach;Internal/external aids;SLP instruction and feedback    Potential to Achieve Goals Good             Patient will benefit from skilled therapeutic intervention in order to improve the following deficits and impairments:   Verbal apraxia  Aphasia  Cognitive communication deficit    Problem List Patient Active Problem List   Diagnosis Date Noted   Seizure ([facility]) 10/28/2020   Primary malignant neoplasm of frontal lobe ([place]) 01/13/2020   Focal seizure  ([place]) 12/07/2019   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 [place], [place], [address] Phone: [phone]   Fax:  [phone]   Name: Candace [name] MRN: [id number] Date of Birth: October 06, 1980

## 2020-12-26 NOTE — Therapy (Signed)
[facility] [address] [place], [place], [address] Phone: [phone]   Fax:  [phone]  Occupational Therapy Treatment  Patient Details  Name: Candace [name] MRN: [id number] Date of Birth: 05-Sep-1980 Referring Provider (OT): Dr. Cecil Cobbs   Encounter Date: 12/26/2020   OT End of Session - 12/26/20 1112     Visit Number 3    Number of Visits 17    Date for OT Re-Evaluation 02/08/21    Authorization Type [employer]--covered 100%, 30 visit limit OT/PT, 30 speech, no auth req.    OT Start Time 1105    OT Stop Time 1143    OT Time Calculation (min) 38 min    Activity Tolerance Patient tolerated treatment well    Behavior During Therapy WFL for tasks assessed/performed             Past Medical History:  Diagnosis Date   Brain tumor [facility])    Headache     Past Surgical History:  Procedure Laterality Date   ABDOMINAL SURGERY     APPLICATION OF CRANIAL NAVIGATION Left 12/08/2019   Procedure: APPLICATION OF CRANIAL NAVIGATION;  Surgeon: Vallarie Mare, MD;  Location: [facility];  Service: Neurosurgery;  Laterality: Left;  APPLICATION OF CRANIAL NAVIGATION   FRAMELESS  BIOPSY WITH BRAINLAB Left 12/08/2019   Procedure: Left stereotactic brain biopsy with brainlab;  Surgeon: Vallarie Mare, MD;  Location: [facility];  Service: Neurosurgery;  Laterality: Left;  Left stereotactic brain biopsy with brainlab   WISDOM TOOTH EXTRACTION      There were no vitals filed for this visit.   Subjective Assessment - 12/26/20 1106     Subjective  Pt reports re-starting steriod    Pertinent History Grade III astrocytoma brain tumor (surgery/biopsy 12/08/19).  PMH:  hx of radiation/chemotherapy (03/02/20), hx of migraines, hx of seizures (hospitalized 10/28/20-11/01/20)    Patient Stated Goals improve RUE functional use    Currently in Pain? Yes    Pain Score 2     Pain Location Hand    Pain Orientation Right    Pain Descriptors /  Indicators Aching;Numbness    Pain Type Acute pain    Pain Frequency Intermittent                          Treatment: copying small peg design with RUE  for increased fine motor coordination, min v.c for design, min difficulty, increased time required, removing with in hand manipulation. Therapist discussed with pt. Importance of using her RUE for light functional activities such as bathing, eating and dressing but to avoid, heavy, breakable or hot items.        OT Education - 12/26/20 1122     Education Details Therapist reviewed coordination HEP issued last visit   Person(s) Educated Patient    Methods Explanation;Demonstration;Verbal cues;Handout    Comprehension Verbalized understanding;Returned demonstration;Verbal cues required              OT Short Term Goals - 12/26/20 1122       OT SHORT TERM GOAL #1   Title Pt will be independent with initial HEP for RUE strength/coordination.--check STGs 01/08/21    Time 4    Period Weeks    Status On-going      OT SHORT TERM GOAL #2   Title Pt will improve coordination/functional reaching with RUE as shown by improving score on box and blocks test by at least 5.  Baseline R-36 blocks    Time 4    Period Weeks    Status On-going      OT SHORT TERM GOAL #3   Title Pt will improve fine motor coordination for ADLs as shown by improving time on 9-hole peg test by at least 4 sec with dominant RUE.    Baseline 36.41sec    Time 4    Period Weeks    Status On-going      OT SHORT TERM GOAL #4   Title Pt will improve R grip strength by at least 5lbs to assist in gripping/lifting tasks.    Baseline 64.1lbs    Time 4    Period Weeks    Status On-going               OT Long Term Goals - 12/26/20 1122       OT LONG TERM GOAL #1   Title Pt will be independent with updated HEP for RUE strength/coordination.--check LTGs 02/08/21    Time 8    Period Weeks    Status New      OT LONG TERM GOAL #2    Title Pt will verbalize understanding of adaptive strategies/AE to incr ease/safety with ADLs/IADls including precautions for sensory deficits and cognitive/visual compensation strategies.    Time 8    Period Weeks    Status New      OT LONG TERM GOAL #3   Title Pt will improve fine motor coordination for ADLs as shown by improving time on 9-hole peg test by at least 8 sec with dominant RUE.    Baseline 36.41sec    Time 8    Period Weeks    Status New      OT LONG TERM GOAL #4   Title Pt will improve coordination/functional reaching with RUE as shown by improving score on box and blocks test by at least 10.    Baseline 36    Time 8    Period Weeks    Status New      OT LONG TERM GOAL #5   Title Pt will verbalize understanding of visual and cognitive HEP prn.    Time 8    Period Weeks    Status New                   Plan - 12/26/20 1114     Clinical Impression Statement Pt reports re-starting steroid. She hopes that her coordination will improve with the re-start of the steroids.    OT Occupational Profile and History Detailed Assessment- Review of Records and additional review of physical, cognitive, psychosocial history related to current functional performance    Occupational performance deficits (Please refer to evaluation for details): ADL's;IADL's;Work;Leisure    Body Structure / Function / Physical Skills ADL;Decreased knowledge of use of DME;Strength;Dexterity;Balance;Proprioception;UE functional use;IADL;ROM;Endurance;Vision;Sensation;Mobility;Coordination;FMC;Decreased knowledge of precautions    Cognitive Skills Attention;Memory    Rehab Potential Good    Clinical Decision Making Several treatment options, min-mod task modification necessary    Comorbidities Affecting Occupational Performance: May have comorbidities impacting occupational performance    Modification or Assistance to Complete Evaluation  Min-Moderate modification of tasks or assist with assess  necessary to complete eval    OT Frequency 2x / week    OT Duration 8 weeks   +eval   OT Treatment/Interventions Self-care/ADL training;Moist Heat;Fluidtherapy;DME and/or AE instruction;Balance training;Therapeutic activities;Aquatic Therapy;Therapeutic exercise;Cognitive remediation/compensation;Visual/perceptual remediation/compensation;Functional Mobility Training;Neuromuscular education;Cryotherapy;Energy conservation;Manual Therapy;Patient/family education    Plan continue to address  RUE functional use    Consulted and Agree with Plan of Care Patient             Patient will benefit from skilled therapeutic intervention in order to improve the following deficits and impairments:   Body Structure / Function / Physical Skills: ADL, Decreased knowledge of use of DME, Strength, Dexterity, Balance, Proprioception, UE functional use, IADL, ROM, Endurance, Vision, Sensation, Mobility, Coordination, FMC, Decreased knowledge of precautions Cognitive Skills: Attention, Memory     Visit Diagnosis: Other lack of coordination  Muscle weakness (generalized)  Other disturbances of skin sensation  Visuospatial deficit  Attention and concentration deficit  Unsteadiness on feet    Problem List Patient Active Problem List   Diagnosis Date Noted   Seizure ([facility]) 10/28/2020   Primary malignant neoplasm of frontal lobe ([place]) 01/13/2020   Focal seizure ([place]) 12/07/2019   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 [place], [place], [address] Phone: [phone]   Fax:  [phone]  Name: Giavonni Cizek MRN: [id number] Date of Birth: Dec 25, 1980

## 2020-12-26 NOTE — Patient Instructions (Signed)
Continue with your HEP - and your reading out loud.

## 2020-12-27 ENCOUNTER — Other Ambulatory Visit: Payer: Self-pay | Admitting: Internal Medicine

## 2020-12-27 ENCOUNTER — Encounter: Payer: Self-pay | Admitting: Internal Medicine

## 2020-12-27 MED ORDER — LACOSAMIDE 150 MG PO TABS: 150.0000 mg | ORAL_TABLET | Freq: Two times a day (BID) | ORAL | 3 refills | Status: DC

## 2020-12-31 ENCOUNTER — Ambulatory Visit: Payer: BC Managed Care – PPO

## 2020-12-31 ENCOUNTER — Other Ambulatory Visit: Payer: Self-pay

## 2020-12-31 ENCOUNTER — Ambulatory Visit: Payer: BC Managed Care – PPO | Attending: Internal Medicine | Admitting: Occupational Therapy

## 2020-12-31 ENCOUNTER — Encounter: Payer: Self-pay | Admitting: Occupational Therapy

## 2020-12-31 DIAGNOSIS — R482 Apraxia: Secondary | ICD-10-CM

## 2020-12-31 DIAGNOSIS — R208 Other disturbances of skin sensation: Secondary | ICD-10-CM | POA: Diagnosis present

## 2020-12-31 DIAGNOSIS — R41840 Attention and concentration deficit: Secondary | ICD-10-CM | POA: Diagnosis present

## 2020-12-31 DIAGNOSIS — R41841 Cognitive communication deficit: Secondary | ICD-10-CM | POA: Diagnosis present

## 2020-12-31 DIAGNOSIS — M6281 Muscle weakness (generalized): Secondary | ICD-10-CM | POA: Insufficient documentation

## 2020-12-31 DIAGNOSIS — R2681 Unsteadiness on feet: Secondary | ICD-10-CM | POA: Diagnosis present

## 2020-12-31 DIAGNOSIS — R278 Other lack of coordination: Secondary | ICD-10-CM | POA: Diagnosis not present

## 2020-12-31 DIAGNOSIS — R41842 Visuospatial deficit: Secondary | ICD-10-CM | POA: Insufficient documentation

## 2020-12-31 DIAGNOSIS — R4701 Aphasia: Secondary | ICD-10-CM | POA: Insufficient documentation

## 2020-12-31 NOTE — Patient Instructions (Addendum)
   Extension (Assistive Putty)   Roll putty back and forth, being sure to use all fingertips. Repeat 3 times. Do 1-2 sessions per day.  Then pinch as below.   Palmar Pinch Strengthening (Resistive Putty)   Pinch putty between thumb and each fingertip in turn after rolling out   Grip Strengthening (Resistive Putty)   Squeeze putty using thumb and all fingers. Repeat 15-20 times. Do 1 sessions per day.    Lateral Pinch Strengthening (Resistive Putty)    Squeeze between thumb and side of each finger in turn. Repeat 10 times. Do 1-2 sessions per day.  "Holding Key"

## 2020-12-31 NOTE — Therapy (Signed)
[name] [address] [name], [name], [address] Phone: [phone]   Fax:  [phone]  Speech Language Pathology Treatment  Patient Details  Name: Candace Candace [name] MRN: [id number] Date of Birth: [birth year] Referring Provider (SLP): Dr. Cecil [name]   Encounter Date: 12/31/2020   End of Session - 12/31/20 1256     Visit Number 5    Number of Visits 25    Date for SLP Re-Evaluation 03/04/21    Authorization Type 30 VL ST; 30 VL PT/OT    Authorization - Visit Number 5    Authorization - Number of Visits 30    SLP Start Time 1104    SLP Stop Time  1145    SLP Time Calculation (min) 41 min    Activity Tolerance Patient tolerated treatment well             Past Medical History:  Diagnosis Date   Brain tumor [name])    Headache     Past Surgical History:  Procedure Laterality Date   ABDOMINAL SURGERY     APPLICATION OF CRANIAL NAVIGATION Left 12/08/2019   Procedure: APPLICATION OF CRANIAL NAVIGATION;  Surgeon: Vallarie Mare, MD;  [facility]: [name];  Service: Neurosurgery;  Laterality: Left;  APPLICATION OF CRANIAL NAVIGATION   FRAMELESS  BIOPSY WITH BRAINLAB Left 12/08/2019   Procedure: Left stereotactic brain biopsy with brainlab;  Surgeon: Vallarie Mare, MD;  Location: [name];  Service: Neurosurgery;  Laterality: Left;  Left stereotactic brain biopsy with brainlab   WISDOM TOOTH EXTRACTION      There were no vitals filed for this visit.          ADULT SLP TREATMENT - 12/31/20 1235       General Information   Behavior/Cognition Alert;Cooperative;Pleasant mood;Lethargic      Treatment Provided   Treatment provided Cognitive-Linquistic      Cognitive-Linquistic Treatment   Treatment focused on Candace [name];Apraxia;Cognition    Skilled Treatment Pt reports fatigue from speech 30 minutes into session at a 3/10 (1="I don't notice any at all" and 10="I need to go home"). SLP asked pt what she  would do if fatigue were at a 6/10 and pt stated she would either say she was done and rest, "sometimes I just power through". SLP told pt if at ALL possible she needs to rest and provided suggestions how to do this. SLP guided pt through rhyming sentences and pt with syllable reduction, and phonemic errors usually, but funtional articulation. SLP noted that when pt adhered to this routine success incr'd: write the word, spell the word x3-5, speak the word x3-4, place it in a phrase x4-5, and then say the whole sentence again. SLP encouraged pt to do this sequence if experiencing difficulty at home with a specific word. Pt acknowledged understanding.      Assessment / Recommendations / Plan   Plan Continue with current plan of care      Progression Toward Goals   Progression toward goals Progressing toward goals              SLP Education - 12/31/20 1246     Education Details working on problem words    Person(s) Educated Patient    Methods Explanation;Demonstration    Comprehension Verbalized understanding;Returned demonstration;Need further instruction              SLP Short Term Goals - 12/31/20 1254       SLP SHORT TERM GOAL #1   Title  Pt will be independent in HEP for verbal apraxia over 3 sessions    Time 4    Period Weeks    Status On-going      SLP SHORT TERM GOAL #2   Title Pt will ID and self correct aphasic or apraxic errors in structured speech tasks and simple conversation 4/5 opportunities with occasional  min A    Time 4    Period Weeks    Status On-going      SLP SHORT TERM GOAL #3   Title Pt will carryover 2 strategies for Candace Candace [name] to read and comprehend 3 paragraph passage with occasional min A    Time 4    Period Weeks    Status On-going      SLP SHORT TERM GOAL #4   Title Pt will use external aids to recall daily tasks to report forgetting 3 or less tasks a week for 2 weeks    Time 4    Period Weeks    Status On-going              SLP  Long Term Goals - 12/31/20 1255       SLP LONG TERM GOAL #1   Title Pt will use WNL rate of speech in simple conversation over 5 minutes with occasional min A over 2 sessions    Time 10    Period Weeks    Status On-going      SLP LONG TERM GOAL #2   Title Pt will ID and self correct all aphasic and apraxic errors during 15 minute conversation with rare min A    Time 10    Period Weeks    Status On-going      SLP LONG TERM GOAL #3   Title Pt will improve score on The Communicative Participation Item Bank Short Form by 3 points (original score 13)    Time 10    Period Weeks    Status On-going      SLP LONG TERM GOAL #4   Title Pt will carryover compensations for slow processing, Candace Candace [name] to participate in small group conversation successfully 3x outside of ST per her report    Time 10    Period Weeks    Status On-going      SLP LONG TERM GOAL #5   Title Pt will correct grammar/aphasic errors on texts with mod I over 3 sessions    Time 10    Period Weeks    Status On-going              Plan - 12/31/20 1254     Clinical Impression Statement Candace Candace [name], Candace Candace [name], Candace Candace [name], memory. She endorses using environmental and external compensatory strategies for Candace [name]/apraxia and cognition. See skilled intervention for  more details. Recommend continued skilled ST to maximize intelligibility, language and cognition for independence, participation in social interactions and QOL    Speech Therapy Frequency 2x / week    Duration 12 weeks    Treatment/Interventions Language facilitation;Environmental controls;Cueing hierarchy;Compensatory strategies;Functional tasks;Cognitive reorganization;Compensatory techniques;Patient/family education;Multimodal communcation approach;Internal/external aids;SLP instruction and feedback    Potential to Achieve Goals Good              Patient will benefit from skilled therapeutic intervention in order to improve the following deficits and impairments:   Verbal apraxia  Candace [name]  Cognitive communication deficit    Problem List Patient Active Problem List  Diagnosis Date Noted   Seizure ([name]) 10/28/2020   Primary malignant neoplasm of frontal lobe ([name]) 01/13/2020   Focal seizure ([name]) 12/07/2019   Astrocytoma of frontal lobe ([name]) 12/07/2019    [place] ,Gervais, [place]  12/31/2020, 12:57 PM  [facility] [address] [place], [name], [address] Phone: [phone]   Fax:  [phone]   Name: Candace Candace [name] MRN: [id number] Date of Birth: May 20, 1980

## 2020-12-31 NOTE — Patient Instructions (Signed)
Work on problem words as we discussed today.

## 2020-12-31 NOTE — Therapy (Signed)
[facility] [address] [place], [place], [address] Phone: [phone]   Fax:  [phone]  Occupational Therapy Treatment  Patient Details  Name: Candace [name] MRN: [id number] Date of Birth: 01/26/1981 Referring Provider (OT): Dr. Cecil Cobbs   Encounter Date: 12/31/2020   OT End of Session - 12/31/20 1020     Visit Number 4    Number of Visits 17    Date for OT Re-Evaluation 02/08/21    Authorization Type BCBS--covered 100%, 30 visit limit OT/PT, 30 speech, no auth req.    OT Start Time 1019    OT Stop Time 1100    OT Time Calculation (min) 41 min    Activity Tolerance Patient tolerated treatment well    Behavior During Therapy WFL for tasks assessed/performed             Past Medical History:  Diagnosis Date   Brain tumor [facility])    Headache     Past Surgical History:  Procedure Laterality Date   ABDOMINAL SURGERY     APPLICATION OF CRANIAL NAVIGATION Left 12/08/2019   Procedure: APPLICATION OF CRANIAL NAVIGATION;  Surgeon: Vallarie Mare, MD;  Location: [facility];  Service: Neurosurgery;  Laterality: Left;  APPLICATION OF CRANIAL NAVIGATION   FRAMELESS  BIOPSY WITH BRAINLAB Left 12/08/2019   Procedure: Left stereotactic brain biopsy with brainlab;  Surgeon: Vallarie Mare, MD;  Location: [facility];  Service: Neurosurgery;  Laterality: Left;  Left stereotactic brain biopsy with brainlab   WISDOM TOOTH EXTRACTION      There were no vitals filed for this visit.   Subjective Assessment - 12/31/20 1019     Subjective  pt reports that she thinks that re-starting steriods are helping.  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  PMH:  hx of radiation/chemotherapy (03/02/20), hx of migraines, hx of seizures (hospitalized 10/28/20-11/01/20)    Patient Stated Goals improve RUE functional use     Currently in Pain? No/denies              Removing pegs from green putty with min difficulty for incr strength and to challenge sensation.  Discussed using oven mitt when cooking for incr safety due to sensation deficits and verbalized understanding and reports that she started using after burn.  Pt reports that sensation fluctuates whether she is taking steroids or not.  Practiced writing. Pt reports difficulty witting bigger and is printing now.  Trial of tan foam built-up grip and the pencil grip.  Pt with good legibility with incr time.  Pt reports incr comfort with the pencil grip--issued for home.  Practiced continuous "l" for incr coordination.  Pt reports that she has to sign some prints (uses sharpie).  Practiced signature with pen and sharpie with good results.        OT Education - 12/31/20 1027     Education Details Green putty HEP--see pt instructions    Person(s) Educated Patient    Methods Explanation;Demonstration;Handout;Verbal cues    Comprehension Verbalized understanding;Returned demonstration              OT Short Term Goals - 12/26/20 1122       OT SHORT TERM GOAL #1   Title Pt will be independent with initial HEP for RUE strength/coordination.--check STGs 01/08/21    Time 4    Period Weeks    Status  On-going      OT SHORT TERM GOAL #2   Title Pt will improve coordination/functional reaching with RUE as shown by improving score on box and blocks test by at least 5.    Baseline R-36 blocks    Time 4    Period Weeks    Status On-going      OT SHORT TERM GOAL #3   Title Pt will improve fine motor coordination for ADLs as shown by improving time on 9-hole peg test by at least 4 sec with dominant RUE.    Baseline 36.41sec    Time 4    Period Weeks    Status On-going      OT SHORT TERM GOAL #4   Title Pt will improve R grip strength by at least 5lbs to assist in gripping/lifting tasks.    Baseline 64.1lbs    Time 4    Period Weeks     Status On-going               OT Long Term Goals - 12/26/20 1122       OT LONG TERM GOAL #1   Title Pt will be independent with updated HEP for RUE strength/coordination.--check LTGs 02/08/21    Time 8    Period Weeks    Status New      OT LONG TERM GOAL #2   Title Pt will verbalize understanding of adaptive strategies/AE to incr ease/safety with ADLs/IADls including precautions for sensory deficits and cognitive/visual compensation strategies.    Time 8    Period Weeks    Status New      OT LONG TERM GOAL #3   Title Pt will improve fine motor coordination for ADLs as shown by improving time on 9-hole peg test by at least 8 sec with dominant RUE.    Baseline 36.41sec    Time 8    Period Weeks    Status New      OT LONG TERM GOAL #4   Title Pt will improve coordination/functional reaching with RUE as shown by improving score on box and blocks test by at least 10.    Baseline 36    Time 8    Period Weeks    Status New      OT LONG TERM GOAL #5   Title Pt will verbalize understanding of visual and cognitive HEP prn.    Time 8    Period Weeks    Status New                   Plan - 12/31/20 1020     Clinical Impression Statement Pt slowly progressing towards goals with improving coordination.  However, pt reports fluctuation in performance with steriods restarted.    OT Occupational Profile and History Detailed Assessment- Review of Records and additional review of physical, cognitive, psychosocial history related to current functional performance    Occupational performance deficits (Please refer to evaluation for details): ADL's;IADL's;Work;Leisure    Body Structure / Function / Physical Skills ADL;Decreased knowledge of use of DME;Strength;Dexterity;Balance;Proprioception;UE functional use;IADL;ROM;Endurance;Vision;Sensation;Mobility;Coordination;FMC;Decreased knowledge of precautions    Cognitive Skills Attention;Memory    Rehab Potential Good    Clinical  Decision Making Several treatment options, min-mod task modification necessary    Comorbidities Affecting Occupational Performance: May have comorbidities impacting occupational performance    Modification or Assistance to Complete Evaluation  Min-Moderate modification of tasks or assist with assess necessary to complete eval    OT Frequency 2x / week  OT Duration 8 weeks   +eval   OT Treatment/Interventions Self-care/ADL training;Moist Heat;Fluidtherapy;DME and/or AE instruction;Balance training;Therapeutic activities;Aquatic Therapy;Therapeutic exercise;Cognitive remediation/compensation;Visual/perceptual remediation/compensation;Functional Mobility Training;Neuromuscular education;Cryotherapy;Energy conservation;Manual Therapy;Patient/family education    Plan continue to address RUE functional use    Consulted and Agree with Plan of Care Patient             Patient will benefit from skilled therapeutic intervention in order to improve the following deficits and impairments:   Body Structure / Function / Physical Skills: ADL, Decreased knowledge of use of DME, Strength, Dexterity, Balance, Proprioception, UE functional use, IADL, ROM, Endurance, Vision, Sensation, Mobility, Coordination, FMC, Decreased knowledge of precautions Cognitive Skills: Attention, Memory     Visit Diagnosis: Other lack of coordination  Muscle weakness (generalized)  Other disturbances of skin sensation  Visuospatial deficit  Attention and concentration deficit    Problem List Patient Active Problem List   Diagnosis Date Noted   Seizure ([place]) 10/28/2020   Primary malignant neoplasm of frontal lobe ([place]) 01/13/2020   Focal seizure ([place]) 12/07/2019   Astrocytoma of frontal lobe ([place]) 12/07/2019    Candace [name], OT/L 12/31/2020, 11:20 AM  [facility] [address] [place], [place], [address] Phone: [phone]   Fax:   [phone]  Name: Candace [name] MRN: [id number] Date of Birth: 1981/02/03  Candace [name], OTR/L [facility] [address] [place], [place]  [address] [phone] phone [phone] 12/31/20 11:20 AM

## 2021-01-02 ENCOUNTER — Other Ambulatory Visit: Payer: Self-pay

## 2021-01-02 ENCOUNTER — Ambulatory Visit: Payer: BC Managed Care – PPO | Admitting: Occupational Therapy

## 2021-01-02 ENCOUNTER — Ambulatory Visit: Payer: BC Managed Care – PPO

## 2021-01-02 DIAGNOSIS — R482 Apraxia: Secondary | ICD-10-CM

## 2021-01-02 DIAGNOSIS — R278 Other lack of coordination: Secondary | ICD-10-CM

## 2021-01-02 DIAGNOSIS — R41841 Cognitive communication deficit: Secondary | ICD-10-CM

## 2021-01-02 DIAGNOSIS — M6281 Muscle weakness (generalized): Secondary | ICD-10-CM

## 2021-01-02 DIAGNOSIS — R208 Other disturbances of skin sensation: Secondary | ICD-10-CM

## 2021-01-02 DIAGNOSIS — R41840 Attention and concentration deficit: Secondary | ICD-10-CM

## 2021-01-02 DIAGNOSIS — R4701 Aphasia: Secondary | ICD-10-CM

## 2021-01-02 DIAGNOSIS — R41842 Visuospatial deficit: Secondary | ICD-10-CM

## 2021-01-02 NOTE — Therapy (Signed)
[facility] [address] [place], [place], [address] Phone: [phone]   Fax:  [phone]  Speech Language Pathology Treatment  Patient Details  Name: Candace [name] MRN: [id number] Date of Birth: 05-17-1980 Referring Provider (SLP): Dr. Cecil Cobbs   Encounter Date: 01/02/2021   End of Session - 01/02/21 1654     Visit Number 6    Number of Visits 25    Date for SLP Re-Evaluation 03/04/21    Authorization Type 30 VL ST; 30 VL PT/OT    Authorization - Visit Number 6    Authorization - Number of Visits 30    SLP Start Time 9892    SLP Stop Time  1230    SLP Time Calculation (min) 41 min    Activity Tolerance Patient tolerated treatment well             Past Medical History:  Diagnosis Date   Brain tumor [facility])    Headache     Past Surgical History:  Procedure Laterality Date   ABDOMINAL SURGERY     APPLICATION OF CRANIAL NAVIGATION Left 12/08/2019   Procedure: APPLICATION OF CRANIAL NAVIGATION;  Surgeon: Vallarie Mare, MD;  Location: [facility];  Service: Neurosurgery;  Laterality: Left;  APPLICATION OF CRANIAL NAVIGATION   FRAMELESS  BIOPSY WITH BRAINLAB Left 12/08/2019   Procedure: Left stereotactic brain biopsy with brainlab;  Surgeon: Vallarie Mare, MD;  Location: [facility];  Service: Neurosurgery;  Laterality: Left;  Left stereotactic brain biopsy with brainlab   WISDOM TOOTH EXTRACTION      There were no vitals filed for this visit.   Subjective Assessment - 01/02/21 1232     Subjective "The B and - - B owner - called [place] - and -said - some--thing was wrong - with - the -water and - we - could- couldn't go."    Currently in Pain? No/denies                   ADULT SLP TREATMENT - 01/02/21 1236       General Information   Behavior/Cognition Alert;Cooperative;Pleasant mood      Treatment Provided   Treatment provided Cognitive-Linquistic      Cognitive-Linquistic Treatment    Treatment focused on Apraxia;Aphasia;Cognition    Skilled Treatment Pt with some more frequent diffiuclty with l blends in sentences on her HEP, but inconsistent. Pt reports more difficulty with /str/ blends as well. SLP encouraged pt to cont with HEP and explained rationale, and to cont with slowed rate and explained rationale. Pt agreed that it was helpful for her to slow her rate for incr'd speech fluidity.SLp encouraged pt to cont both of these practices to allow herself best possible verbal expression.      Assessment / Recommendations / Plan   Plan Continue with current plan of care      Progression Toward Goals   Progression toward goals Progressing toward goals              SLP Education - 01/02/21 1654     Education Details cont with HEP and slowed rate    Person(s) Educated Patient    Methods Explanation    Comprehension Verbalized understanding              SLP Short Term Goals - 01/02/21 1205       SLP SHORT TERM GOAL #1   Title Pt will be independent in HEP for verbal apraxia over 3 sessions  Baseline 01-02-21    Time 4    Period Weeks    Status On-going      SLP SHORT TERM GOAL #2   Title Pt will ID and self correct aphasic or apraxic errors in structured speech tasks and simple conversation 4/5 opportunities with occasional  min A    Baseline 01-02-21    Time 4    Period Weeks    Status On-going      SLP SHORT TERM GOAL #3   Title Pt will carryover 2 strategies for attention to read and comprehend 3 paragraph passage with occasional min A    Time 4    Period Weeks    Status On-going      SLP SHORT TERM GOAL #4   Title Pt will use external aids to recall daily tasks to report forgetting 3 or less tasks a week for 2 weeks    Time 4    Period Weeks    Status On-going              SLP Long Term Goals - 01/02/21 1656       SLP LONG TERM GOAL #1   Title Pt will use WNL rate of speech in simple conversation over 5 minutes with occasional min  A over 2 sessions    Time 10    Period Weeks    Status On-going      SLP LONG TERM GOAL #2   Title Pt will ID and self correct all aphasic and apraxic errors during 15 minute conversation with rare min A    Time 10    Period Weeks    Status On-going      SLP LONG TERM GOAL #3   Title Pt will improve score on The Communicative Participation Item Bank Short Form by 3 points (original score 13)    Time 10    Period Weeks    Status On-going      SLP LONG TERM GOAL #4   Title Pt will carryover compensations for slow processing, attention to participate in small group conversation successfully 3x outside of ST per her report    Time 10    Period Weeks    Status On-going      SLP LONG TERM GOAL #5   Title Pt will correct grammar/aphasic errors on texts with mod I over 3 sessions    Time 10    Period Weeks    Status On-going              Plan - 01/02/21 1655     Clinical Impression Statement Candace [name] conitnues to present with mild aphasia, 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 She endorses using environmental and external compensatory strategies for aphasia/apraxia and cognition. She cont to do excellently with speech compensations. See skilled intervention for  more details. Recommend continued skilled ST to maximize intelligibility, language and cognition for independence, participation in social interactions and QOL. Possible decr in frequency in the next 1-2 visits.    Speech Therapy Frequency 2x / week    Duration 12 weeks    Treatment/Interventions Language facilitation;Environmental controls;Cueing hierarchy;Compensatory strategies;Functional tasks;Cognitive reorganization;Compensatory techniques;Patient/family education;Multimodal communcation approach;Internal/external aids;SLP instruction and feedback    Potential to Achieve Goals Good             Patient will benefit from skilled therapeutic  intervention in order to improve the following deficits and impairments:   Verbal apraxia  Aphasia  Cognitive  communication deficit    Problem List Patient Active Problem List   Diagnosis Date Noted   Seizure ([place]) 10/28/2020   Primary malignant neoplasm of frontal lobe ([place]) 01/13/2020   Focal seizure ([place]) 12/07/2019   Astrocytoma of frontal lobe ([place]) 12/07/2019    [facility] 01/02/2021, 4:57 PM  [facility] [address] [place], [place], [address] Phone: [phone]   Fax:  [phone]   Name: Candace [name] MRN: [id number] Date of Birth: [birth year]

## 2021-01-02 NOTE — Therapy (Signed)
[facility] [address] [place], [place], [address] Phone: [phone]   Fax:  [phone]  Occupational Therapy Treatment  Patient Details  Name: Candace [name] MRN: [id number] Date of Birth: 26-Jul-1980 Referring Provider (OT): Dr. Cecil Cobbs   Encounter Date: 01/02/2021   OT End of Session - 01/02/21 1238     Visit Number 5    Number of Visits 17    Date for OT Re-Evaluation 02/08/21    Authorization Type BCBS--covered 100%, 30 visit limit OT/PT, 30 speech, no auth req.    OT Start Time 7017    OT Stop Time 1315    OT Time Calculation (min) 40 min             Past Medical History:  Diagnosis Date   Brain tumor [facility])    Headache     Past Surgical History:  Procedure Laterality Date   ABDOMINAL SURGERY     APPLICATION OF CRANIAL NAVIGATION Left 12/08/2019   Procedure: APPLICATION OF CRANIAL NAVIGATION;  Surgeon: Vallarie Mare, MD;  Location: [facility];  Service: Neurosurgery;  Laterality: Left;  APPLICATION OF CRANIAL NAVIGATION   FRAMELESS  BIOPSY WITH BRAINLAB Left 12/08/2019   Procedure: Left stereotactic brain biopsy with brainlab;  Surgeon: Vallarie Mare, MD;  Location: [facility];  Service: Neurosurgery;  Laterality: Left;  Left stereotactic brain biopsy with brainlab   WISDOM TOOTH EXTRACTION      There were no vitals filed for this visit.   Subjective Assessment - 01/02/21 1238     Pertinent History Grade III astrocytoma brain tumor (surgery/biopsy 12/08/19).  PMH:  hx of radiation/chemotherapy (03/02/20), hx of migraines, hx of seizures (hospitalized 10/28/20-11/01/20)    Limitations hx of seizures    Patient Stated Goals improve RUE functional use    Currently in Pain? No/denies                        Treatment:Grooved pegboard for increased fine motor coordination and in -hand manipulation, min difficulty and v.c , increased time required. Standing to perform functional  reaching with sustained pinch using RUE to remove graded clothespins for antennae, min v.c for pinch. Distal Finger control Worksheet using pen with foam grip with good performance, and increased time,  followed by continuous "l" then pt transitioned to writing and copying sentences with good legibility and increased time using foam grip.            OT Short Term Goals - 12/26/20 1122       OT SHORT TERM GOAL #1   Title Pt will be independent with initial HEP for RUE strength/coordination.--check STGs 01/08/21    Time 4    Period Weeks    Status On-going      OT SHORT TERM GOAL #2   Title Pt will improve coordination/functional reaching with RUE as shown by improving score on box and blocks test by at least 5.    Baseline R-36 blocks    Time 4    Period Weeks    Status On-going      OT SHORT TERM GOAL #3   Title Pt will improve fine motor coordination for ADLs as shown by improving time on 9-hole peg test by at least 4 sec with dominant RUE.    Baseline 36.41sec    Time 4    Period Weeks    Status On-going      OT SHORT TERM GOAL #4  Title Pt will improve R grip strength by at least 5lbs to assist in gripping/lifting tasks.    Baseline 64.1lbs    Time 4    Period Weeks    Status On-going               OT Long Term Goals - 12/26/20 1122       OT LONG TERM GOAL #1   Title Pt will be independent with updated HEP for RUE strength/coordination.--check LTGs 02/08/21    Time 8    Period Weeks    Status New      OT LONG TERM GOAL #2   Title Pt will verbalize understanding of adaptive strategies/AE to incr ease/safety with ADLs/IADls including precautions for sensory deficits and cognitive/visual compensation strategies.    Time 8    Period Weeks    Status New      OT LONG TERM GOAL #3   Title Pt will improve fine motor coordination for ADLs as shown by improving time on 9-hole peg test by at least 8 sec with dominant RUE.    Baseline 36.41sec    Time 8     Period Weeks    Status New      OT LONG TERM GOAL #4   Title Pt will improve coordination/functional reaching with RUE as shown by improving score on box and blocks test by at least 10.    Baseline 36    Time 8    Period Weeks    Status New      OT LONG TERM GOAL #5   Title Pt will verbalize understanding of visual and cognitive HEP prn.    Time 8    Period Weeks    Status New                    Patient will benefit from skilled therapeutic intervention in order to improve the following deficits and impairments:           Visit Diagnosis: Other lack of coordination  Muscle weakness (generalized)  Other disturbances of skin sensation  Visuospatial deficit  Attention and concentration deficit    Problem List Patient Active Problem List   Diagnosis Date Noted   Seizure ([place]) 10/28/2020   Primary malignant neoplasm of frontal lobe ([place]) 01/13/2020   Focal seizure ([place]) 12/07/2019   Astrocytoma of frontal lobe ([place]) 12/07/2019    Candace [name], OT/L 01/02/2021, 12:47 PM  [facility] [address] [place], [place], [address] Phone: [phone]   Fax:  [phone]  Name: Candace [name] MRN: [id number] Date of Birth: [birth year]

## 2021-01-08 ENCOUNTER — Ambulatory Visit: Payer: BC Managed Care – PPO | Admitting: Speech Pathology

## 2021-01-08 ENCOUNTER — Ambulatory Visit: Payer: BC Managed Care – PPO | Admitting: Occupational Therapy

## 2021-01-14 ENCOUNTER — Ambulatory Visit: Payer: BC Managed Care – PPO | Admitting: Occupational Therapy

## 2021-01-16 ENCOUNTER — Encounter: Payer: BC Managed Care – PPO | Admitting: Occupational Therapy

## 2021-01-17 ENCOUNTER — Ambulatory Visit: Payer: BC Managed Care – PPO | Admitting: Speech Pathology

## 2021-01-21 ENCOUNTER — Ambulatory Visit: Payer: BC Managed Care – PPO | Admitting: Occupational Therapy

## 2021-01-21 ENCOUNTER — Ambulatory Visit: Payer: BC Managed Care – PPO | Admitting: Speech Pathology

## 2021-01-23 ENCOUNTER — Encounter: Payer: BC Managed Care – PPO | Admitting: Occupational Therapy

## 2021-01-24 ENCOUNTER — Ambulatory Visit (HOSPITAL_COMMUNITY)
Admission: RE | Admit: 2021-01-24 | Discharge: 2021-01-24 | Disposition: A | Discharge status: Home / Self Care | Payer: BC Managed Care – PPO | Source: Ambulatory Visit | Attending: Internal Medicine | Admitting: Internal Medicine

## 2021-01-24 ENCOUNTER — Other Ambulatory Visit: Payer: Self-pay | Admitting: Radiation Therapy

## 2021-01-24 ENCOUNTER — Other Ambulatory Visit: Payer: Self-pay

## 2021-01-24 DIAGNOSIS — C711 Malignant neoplasm of frontal lobe: Secondary | ICD-10-CM | POA: Diagnosis not present

## 2021-01-24 IMAGING — MR MR HEAD WO/W CM
13 series · 48 of 48 positions shown · IV contrast (gadavist)
Comparison: [DATE]

CLINICAL DATA: Brain/CNS neoplasm, assess treatment response; grade
3 astrocytoma

EXAM:
MRI HEAD WITHOUT AND WITH CONTRAST
TECHNIQUE: Multiplanar, multiecho pulse sequences of the brain and surrounding
structures were obtained without and with intravenous contrast.
CONTRAST:  6mL GADAVIST GADOBUTROL 1 MMOL/ML IV SOLN

[Series 5: DWI · axial · 3.0mm · 1.36mm/px · z∈[-35,+118]mm · 5 of 103 slices shown (1 of 2)]
[im 1/103]
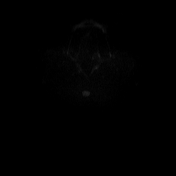
[im 26/103]
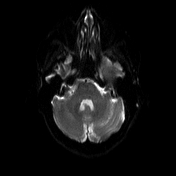
[im 52/103]
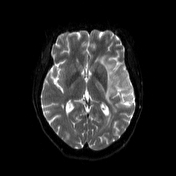
[im 77/103]
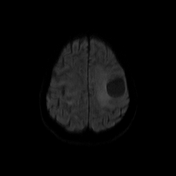
[im 103/103]
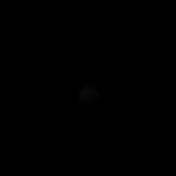

[Series 6: DWI · axial · 3.0mm · 1.36mm/px · z∈[-35,+118]mm · 2 of 52 slices shown (2 of 2)]
[im 1/52]
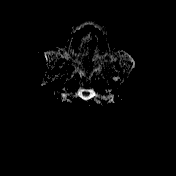
[im 52/52]
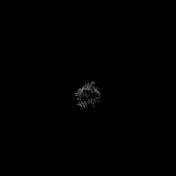

[Series 7: T1 · sagittal · 5.0mm · 0.75mm/px · 2 of 26 slices shown (1 of 2)]
[im 1/26]
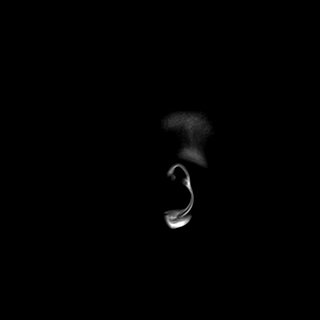
[im 26/26]
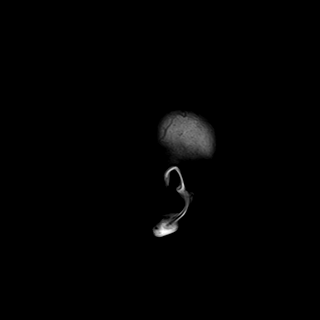

[Series 8: T2 · axial · 5.0mm · 0.62mm/px · 1 of 25 slices shown]
[im 1/25]
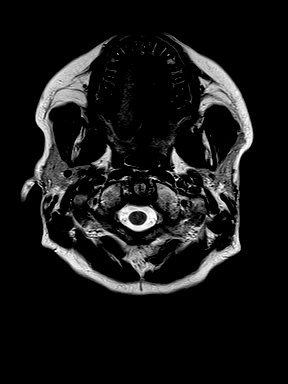

[Series 9: swi_images · axial · 3.0mm · 0.75mm/px · z∈[-37,+116]mm · 3 of 52 slices shown]
[im 1/52]
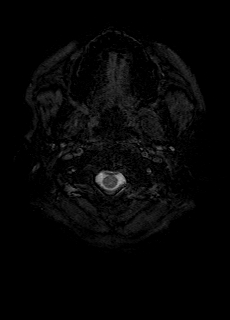
[im 26/52]
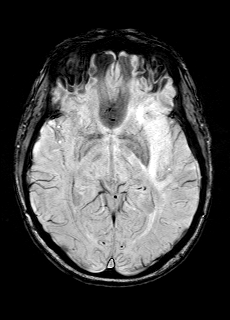
[im 52/52]
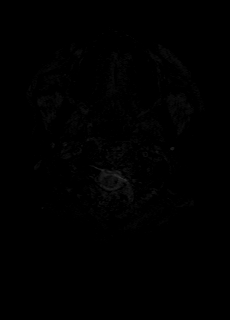

[Series 11: FLAIR · axial · 3.0mm · 0.75mm/px · z∈[-37,+116]mm · 3 of 52 slices shown]
[im 1/52]
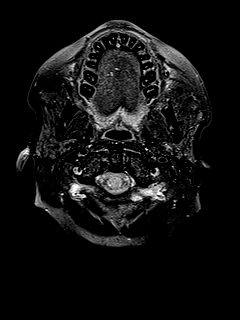
[im 26/52]
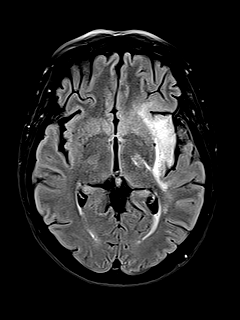
[im 52/52]
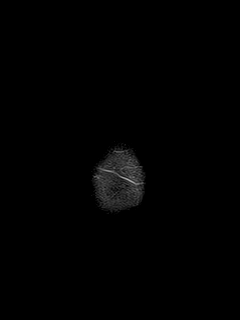

[Series 12: T1 · axial · 1.0mm · 0.94mm/px · z∈[-40,+119]mm · 10 of 160 slices shown (2 of 2)]
[im 1/160]
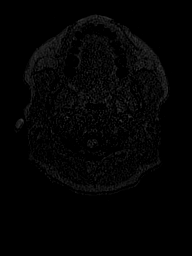
[im 18/160]
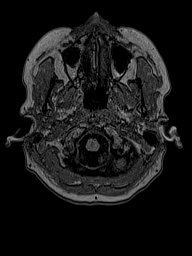
[im 36/160]
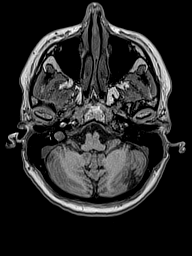
[im 54/160]
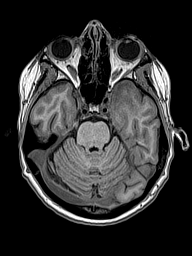
[im 71/160]
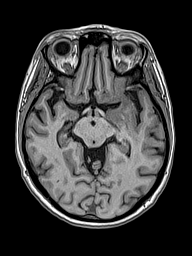
[im 89/160]
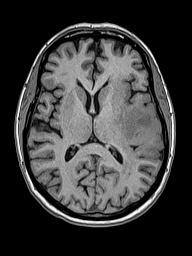
[im 107/160]
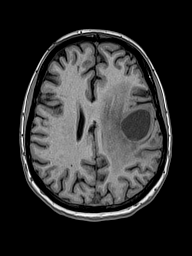
[im 124/160]
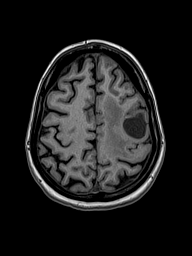
[im 142/160]
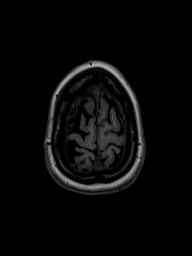
[im 160/160]
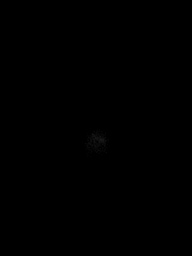

[Series 13: cor dwi_tracew · coronal · 5.0mm · 1.53mm/px · 4 of 60 slices shown]
[im 1/60]
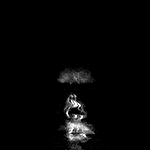
[im 20/60]
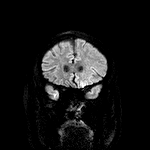
[im 40/60]
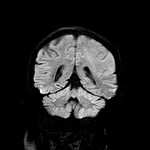
[im 60/60]
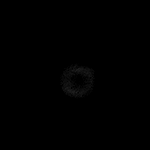

[Series 14: cor dwi_adc · coronal · 5.0mm · 1.53mm/px · 2 of 29 slices shown]
[im 1/29]
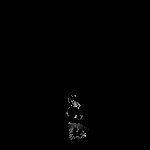
[im 29/29]
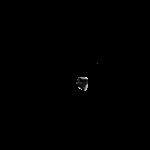

[Series 15: T2 post-contrast · coronal · 5.0mm · 0.57mm/px · 2 of 30 slices shown]
[im 1/30]
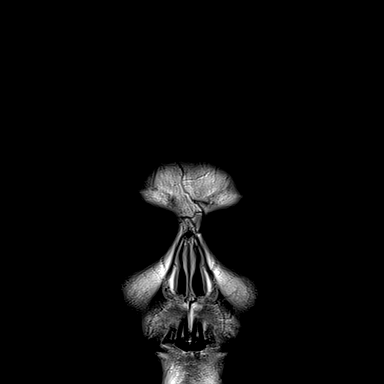
[im 30/30]
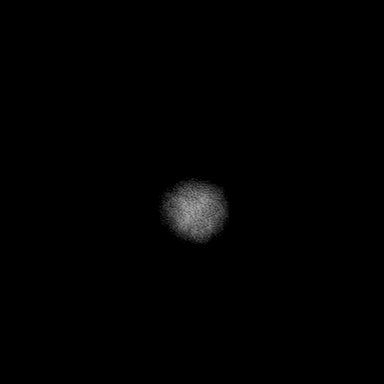

[Series 16: T1 post-contrast · axial · 1.0mm · 0.94mm/px · z∈[-40,+119]mm · 10 of 160 slices shown (1 of 3)]
[im 1/160]
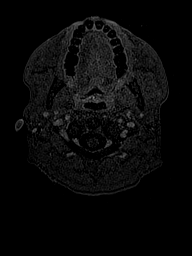
[im 18/160]
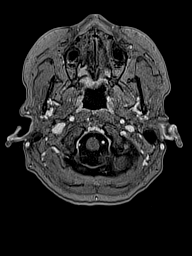
[im 36/160]
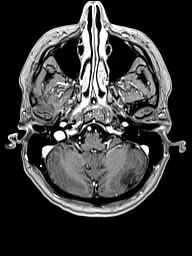
[im 54/160]
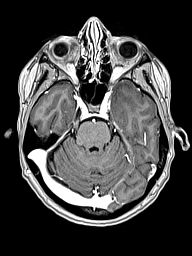
[im 71/160]
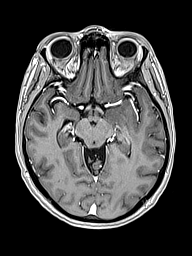
[im 89/160]
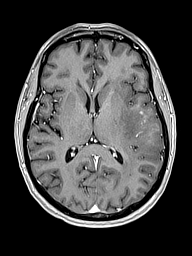
[im 107/160]
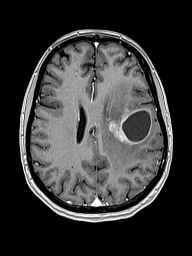
[im 124/160]
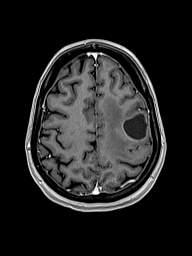
[im 142/160]
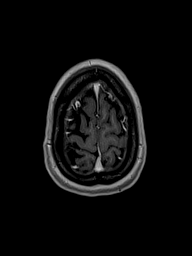
[im 160/160]
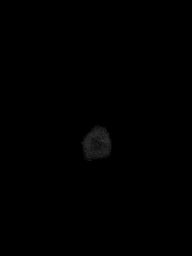

[Series 17: T1 post-contrast · coronal · 5.0mm · 0.43mm/px · 2 of 30 slices shown (2 of 3)]
[im 1/30]
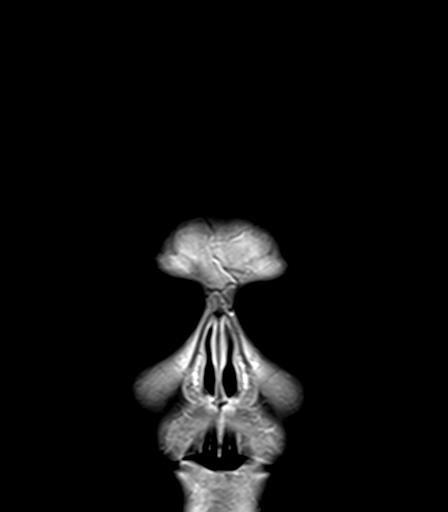
[im 30/30]
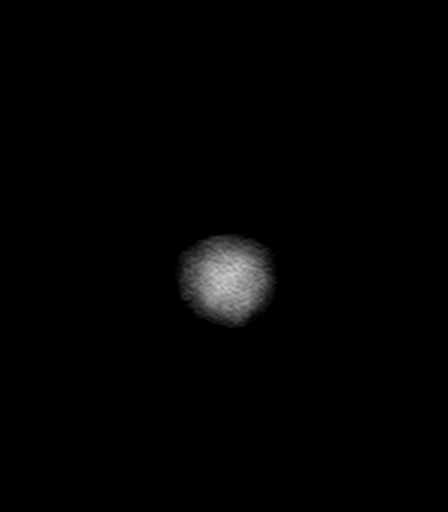

[Series 18: T1 post-contrast · sagittal · 5.0mm · 0.75mm/px · 2 of 26 slices shown (3 of 3)]
[im 1/26]
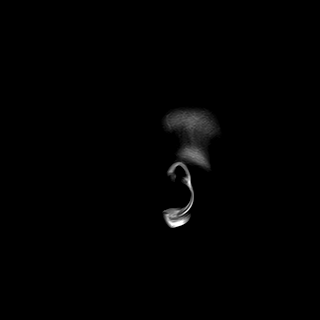
[im 26/26]
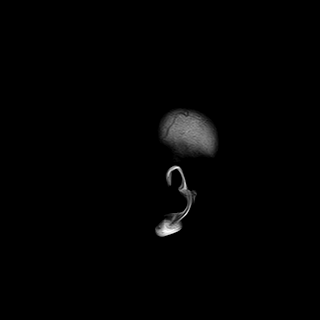

[48 of 48 positions shown; findings below may reference images not displayed]

FINDINGS: Brain: No substantial change in extent of infiltrative T2 FLAIR
hyperintensity in the left cerebral hemisphere involving temporal
lobe, deep white matter and insula, frontal lobe, and parietal lobe.
Similar size of cystic lesion of the left frontal lobe with signal
intensity greater than CSF and thin peripheral enhancement.
Ill-defined enhancement deep and inferior to the cystic lesion
extending to the margin of the body of the left lateral ventricle is
again noted. Enhancement has slightly increased (for example at the
posterior and deep margins on series 16.

Similar mild regional mass effect.

No acute infarction. No hydrocephalus. Small right frontal
developmental venous anomaly.

Vascular: Major vessel flow voids at the skull base are preserved.

Skull and upper cervical spine: Normal marrow signal is preserved.

Sinuses/Orbits: Paranasal sinuses are aerated. Orbits are
unremarkable.

Other: Sella is unremarkable.  Mastoid air cells are clear.
IMPRESSION: Since [DATE],

Similar extent of abnormal left cerebral T2 FLAIR hyperintensity.
Slight increase in ill-defined enhancement.

## 2021-01-24 MED ORDER — GADOBUTROL 1 MMOL/ML IV SOLN
6.0000 mL | Freq: Once | INTRAVENOUS | Status: AC | PRN
  Administered 2021-01-24: 6 mL via INTRAVENOUS

## 2021-01-25 ENCOUNTER — Ambulatory Visit: Payer: BC Managed Care – PPO | Admitting: Occupational Therapy

## 2021-01-25 ENCOUNTER — Encounter: Payer: Self-pay | Admitting: Internal Medicine

## 2021-01-25 ENCOUNTER — Ambulatory Visit: Payer: BC Managed Care – PPO

## 2021-01-25 ENCOUNTER — Encounter: Payer: Self-pay | Admitting: Occupational Therapy

## 2021-01-25 ENCOUNTER — Other Ambulatory Visit: Payer: Self-pay | Admitting: *Deleted

## 2021-01-25 VITALS — BP 131/76 | HR 55

## 2021-01-25 DIAGNOSIS — M6281 Muscle weakness (generalized): Secondary | ICD-10-CM

## 2021-01-25 DIAGNOSIS — R278 Other lack of coordination: Secondary | ICD-10-CM | POA: Diagnosis not present

## 2021-01-25 DIAGNOSIS — R41840 Attention and concentration deficit: Secondary | ICD-10-CM

## 2021-01-25 DIAGNOSIS — R482 Apraxia: Secondary | ICD-10-CM

## 2021-01-25 DIAGNOSIS — R41841 Cognitive communication deficit: Secondary | ICD-10-CM

## 2021-01-25 DIAGNOSIS — R208 Other disturbances of skin sensation: Secondary | ICD-10-CM

## 2021-01-25 DIAGNOSIS — R4701 Aphasia: Secondary | ICD-10-CM

## 2021-01-25 DIAGNOSIS — R41842 Visuospatial deficit: Secondary | ICD-10-CM

## 2021-01-25 MED ORDER — LACOSAMIDE 150 MG PO TABS: 150.0000 mg | ORAL_TABLET | Freq: Two times a day (BID) | ORAL | 3 refills | Status: DC

## 2021-01-25 NOTE — Therapy (Signed)
[facility] [address] [place], [place], [address] Phone: [phone]   Fax:  [phone]  Occupational Therapy Treatment  Patient Details  Name: Candace [name] MRN: [id number] Date of Birth: September 06, 1980 Referring Provider (OT): Dr. Cecil Cobbs   Encounter Date: 01/25/2021   OT End of Session - 01/25/21 1106     Visit Number 6    Number of Visits 17    Date for OT Re-Evaluation 02/08/21    OT Start Time 1104    OT Stop Time 1144    OT Time Calculation (min) 40 min             Past Medical History:  Diagnosis Date   Brain tumor [facility])    Headache     Past Surgical History:  Procedure Laterality Date   ABDOMINAL SURGERY     APPLICATION OF CRANIAL NAVIGATION Left 12/08/2019   Procedure: APPLICATION OF CRANIAL NAVIGATION;  Surgeon: Vallarie Mare, MD;  Location: [facility];  Service: Neurosurgery;  Laterality: Left;  APPLICATION OF CRANIAL NAVIGATION   FRAMELESS  BIOPSY WITH BRAINLAB Left 12/08/2019   Procedure: Left stereotactic brain biopsy with brainlab;  Surgeon: Vallarie Mare, MD;  Location: [facility];  Service: Neurosurgery;  Laterality: Left;  Left stereotactic brain biopsy with brainlab   WISDOM TOOTH EXTRACTION      Vitals:   01/25/21 1121  BP: 131/76  Pulse: (!) 55     Subjective Assessment - 01/25/21 1105     Subjective  Pt reports she is on low dose steroids    Pertinent History Grade III astrocytoma brain tumor (surgery/biopsy 12/08/19).  PMH:  hx of radiation/chemotherapy (03/02/20), hx of migraines, hx of seizures (hospitalized 10/28/20-11/01/20)    Limitations hx of seizures    Patient Stated Goals improve RUE functional use    Currently in Pain? No/denies                    Treatment: Activities on mat for UE strength and core stability. When pt initially transitioned to weightbearing through hands pt became dizzy. Vitals were monitored and pt rested in supine. Pt felt better  and she proceeded with bird dog, rocking forwards and backwards(child's pose, to cobra), plank on elbows, and reverse plank, supervison, min v.c Therapist checked short term goals.              OT Education - 01/25/21 1421     Education Details reveiwed activites from coordination HEP: manipulating and stacking coins, pt practiced rotating 2 golf balls in her right hand, min difficulty/ v.c    Person(s) Educated Patient    Methods Explanation;Demonstration;Handout;Verbal cues    Comprehension Verbalized understanding;Returned demonstration              OT Short Term Goals - 01/25/21 1424       OT SHORT TERM GOAL #1   Title Pt will be independent with initial HEP for RUE strength/coordination.--check STGs 01/08/21    Time 4    Period Weeks    Status Achieved      OT SHORT TERM GOAL #2   Title Pt will improve coordination/functional reaching with RUE as shown by improving score on box and blocks test by at least 5.    Baseline R-36 blocks    Time 4    Period Weeks    Status Achieved   50     OT SHORT TERM GOAL #3   Title Pt will improve fine motor  coordination for ADLs as shown by improving time on 9-hole peg test by at least 4 sec with dominant RUE.    Baseline 36.41sec    Time 4    Period Weeks    Status Achieved   29.75     OT SHORT TERM GOAL #4   Title Pt will improve R grip strength by at least 5lbs to assist in gripping/lifting tasks.    Baseline 64.1lbs    Time 4    Period Weeks    Status On-going   67.4-10/28/22              OT Long Term Goals - 01/25/21 1112       OT LONG TERM GOAL #1   Title Pt will be independent with updated HEP for RUE strength/coordination.--check LTGs 02/08/21    Time 8    Period Weeks    Status On-going      OT LONG TERM GOAL #2   Title Pt will verbalize understanding of adaptive strategies/AE to incr ease/safety with ADLs/IADls including precautions for sensory deficits and cognitive/visual compensation  strategies.    Time 8    Period Weeks    Status On-going      OT LONG TERM GOAL #3   Title Pt will improve fine motor coordination for ADLs as shown by improving time on 9-hole peg test by at least 8 sec with dominant RUE.    Baseline 36.41sec    Time 8    Period Weeks    Status On-going      OT LONG TERM GOAL #4   Title Pt will improve coordination/functional reaching with RUE as shown by improving score on box and blocks test by at least 10.    Baseline 36    Time 8    Period Weeks    Status On-going      OT LONG TERM GOAL #5   Title Pt will verbalize understanding of visual and cognitive HEP prn.    Time 8    Period Weeks    Status On-going                   Plan - 01/25/21 1142     Clinical Impression Statement Pt is progressing towards goals with improving grip strength and coordination. Pt met 3/4 short term goals.    OT Occupational Profile and History Detailed Assessment- Review of Records and additional review of physical, cognitive, psychosocial history related to current functional performance    Occupational performance deficits (Please refer to evaluation for details): ADL's;IADL's;Work;Leisure    Body Structure / Function / Physical Skills ADL;Decreased knowledge of use of DME;Strength;Dexterity;Balance;Proprioception;UE functional use;IADL;ROM;Endurance;Vision;Sensation;Mobility;Coordination;FMC;Decreased knowledge of precautions    Cognitive Skills Attention;Memory    Rehab Potential Good    Clinical Decision Making Several treatment options, min-mod task modification necessary    Comorbidities Affecting Occupational Performance: May have comorbidities impacting occupational performance    Modification or Assistance to Complete Evaluation  Min-Moderate modification of tasks or assist with assess necessary to complete eval    OT Frequency 2x / week    OT Duration 8 weeks   +eval   OT Treatment/Interventions Self-care/ADL training;Moist  Heat;Fluidtherapy;DME and/or AE instruction;Balance training;Therapeutic activities;Aquatic Therapy;Therapeutic exercise;Cognitive remediation/compensation;Visual/perceptual remediation/compensation;Functional Mobility Training;Neuromuscular education;Cryotherapy;Energy conservation;Manual Therapy;Patient/family education    Plan continue to address RUE functional use, work towards long term goals    Consulted and Agree with Plan of Care Patient             Patient  will benefit from skilled therapeutic intervention in order to improve the following deficits and impairments:   Body Structure / Function / Physical Skills: ADL, Decreased knowledge of use of DME, Strength, Dexterity, Balance, Proprioception, UE functional use, IADL, ROM, Endurance, Vision, Sensation, Mobility, Coordination, FMC, Decreased knowledge of precautions Cognitive Skills: Attention, Memory     Visit Diagnosis: Other lack of coordination  Muscle weakness (generalized)  Other disturbances of skin sensation  Visuospatial deficit  Attention and concentration deficit    Problem List Patient Active Problem List   Diagnosis Date Noted   Seizure ([facility]) 10/28/2020   Primary malignant neoplasm of frontal lobe ([employer]) 01/13/2020   Focal seizure ([place]) 12/07/2019   Astrocytoma of frontal lobe ([place]) 12/07/2019    Bhargav Barbaro, OT/L 01/25/2021, 2:29 PM Theone Murdoch, OTR/L [phone] Phone: [phone] 2:29 PM 01/25/21  [facility] [address] [place], [place], [address] Phone: [phone]   Fax:  [phone]  Name: Candace [name] MRN: [id number] Date of Birth: 04-06-1980

## 2021-01-25 NOTE — Therapy (Signed)
[facility] [address] [place], [place], [address] Phone: [phone]   Fax:  [phone]  Speech Language Pathology Treatment  Patient Details  Name: Candace [name] MRN: [id number] Date of Birth: January 04, 1981 Referring Provider (SLP): Dr. Cecil Cobbs   Encounter Date: 01/25/2021   End of Session - 01/25/21 1437     Visit Number 7    Number of Visits 25    Date for SLP Re-Evaluation 03/04/21    Authorization Type 30 VL ST; 30 VL PT/OT    Authorization - Visit Number 7    Authorization - Number of Visits 30    SLP Start Time 1020    SLP Stop Time  1100    SLP Time Calculation (min) 40 min    Activity Tolerance Patient tolerated treatment well             Past Medical History:  Diagnosis Date   Brain tumor [facility])    Headache     Past Surgical History:  Procedure Laterality Date   ABDOMINAL SURGERY     APPLICATION OF CRANIAL NAVIGATION Left 12/08/2019   Procedure: APPLICATION OF CRANIAL NAVIGATION;  Surgeon: Vallarie Mare, MD;  Location: [facility];  Service: Neurosurgery;  Laterality: Left;  APPLICATION OF CRANIAL NAVIGATION   FRAMELESS  BIOPSY WITH BRAINLAB Left 12/08/2019   Procedure: Left stereotactic brain biopsy with brainlab;  Surgeon: Vallarie Mare, MD;  Location: [facility];  Service: Neurosurgery;  Laterality: Left;  Left stereotactic brain biopsy with brainlab   WISDOM TOOTH EXTRACTION      There were no vitals filed for this visit.   Subjective Assessment - 01/25/21 1033     Subjective "I went off the steroids and my speech gradually got worse. I went back on and it got better." (with pauses and hesitation)    Currently in Pain? No/denies                   ADULT SLP TREATMENT - 01/25/21 1046       General Information   Behavior/Cognition Alert;Cooperative;Pleasant mood      Treatment Provided   Treatment provided Cognitive-Linquistic      Cognitive-Linquistic Treatment    Treatment focused on Apraxia;Aphasia    Skilled Treatment SLP engaged pt with mod complex conversation. Pt used slowed rate and some overarticulation and syllable by syllable compensation with focused difficulty. Generally, her speech sounds very much like it did last session (01-02-21). SLP cancelled her appointment for Monday with a new SLP and told her to use that time talking to her family or a close friend focusing on strategies used today. Pt agreed with this.      Assessment / Recommendations / Plan   Plan Continue with current plan of care   possible decr in frequency     Progression Toward Goals   Progression toward goals --   speech today with same characteristics as last session > three weeks ago (01-02-21)               SLP Short Term Goals - 01/02/21 1205       SLP SHORT TERM GOAL #1   Title Pt will be independent in HEP for verbal apraxia over 3 sessions    Baseline 01-02-21    Time 4    Period Weeks    Status On-going      SLP SHORT TERM GOAL #2   Title Pt will ID and self correct aphasic or apraxic  errors in structured speech tasks and simple conversation 4/5 opportunities with occasional  min A    Baseline 01-02-21    Time 4    Period Weeks    Status On-going      SLP SHORT TERM GOAL #3   Title Pt will carryover 2 strategies for attention to read and comprehend 3 paragraph passage with occasional min A    Time 4    Period Weeks    Status On-going      SLP SHORT TERM GOAL #4   Title Pt will use external aids to recall daily tasks to report forgetting 3 or less tasks a week for 2 weeks    Time 4    Period Weeks    Status On-going              SLP Long Term Goals - 01/25/21 1443       SLP LONG TERM GOAL #1   Title Pt will use WNL rate of speech in simple conversation over 5 minutes with occasional min A over 2 sessions    Time 10    Period Weeks    Status On-going      SLP LONG TERM GOAL #2   Title Pt will ID and self correct all aphasic and  apraxic errors during 15 minute conversation with rare min A    Time 10    Period Weeks    Status On-going      SLP LONG TERM GOAL #3   Title Pt will improve score on The Communicative Participation Item Bank Short Form by 3 points (original score 13)    Time 10    Period Weeks    Status On-going      SLP LONG TERM GOAL #4   Title Pt will carryover compensations for slow processing, attention to participate in small group conversation successfully 3x outside of ST per her report    Time 10    Period Weeks    Status On-going      SLP LONG TERM GOAL #5   Title Pt will correct grammar/aphasic errors on texts with mod I over 3 sessions    Time 10    Period Weeks    Status On-going              Plan - 01/25/21 1441     Clinical Impression Statement Candace [name] conitnues to present with mild aphasia, 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 She endorses using environmental and external compensatory strategies for aphasia/apraxia and cognition. She cont to do excellently with speech compensations. See skilled intervention for  more details. Recommend continued skilled ST to maximize intelligibility, language and cognition for independence, participation in social interactions and QOL. Possible decr in frequency in the next 1-2 visits.    Speech Therapy Frequency 2x / week    Duration 12 weeks    Treatment/Interventions Language facilitation;Environmental controls;Cueing hierarchy;Compensatory strategies;Functional tasks;Cognitive reorganization;Compensatory techniques;Patient/family education;Multimodal communcation approach;Internal/external aids;SLP instruction and feedback    Potential to Achieve Goals Good             Patient will benefit from skilled therapeutic intervention in order to improve the following deficits and impairments:   Verbal apraxia  Aphasia  Cognitive communication deficit    Problem  List Patient Active Problem List   Diagnosis Date Noted   Seizure ([place]) 10/28/2020   Primary malignant neoplasm of frontal lobe ([place]) 01/13/2020   Focal seizure ([place]) 12/07/2019  Astrocytoma of frontal lobe ([place]) 12/07/2019    [facility] ,[place], [place]  01/25/2021, 2:44 PM  [facility] [address] [place], [place], [address] Phone: [phone]   Fax:  [phone]   Name: Candace [name] MRN: [id number] Date of Birth: 11-30-1980

## 2021-01-28 ENCOUNTER — Encounter: Payer: Self-pay | Admitting: Occupational Therapy

## 2021-01-28 ENCOUNTER — Other Ambulatory Visit: Payer: Self-pay | Admitting: Internal Medicine

## 2021-01-28 ENCOUNTER — Ambulatory Visit: Payer: BC Managed Care – PPO | Admitting: Occupational Therapy

## 2021-01-28 ENCOUNTER — Other Ambulatory Visit: Payer: Self-pay

## 2021-01-28 ENCOUNTER — Ambulatory Visit: Payer: BC Managed Care – PPO

## 2021-01-28 ENCOUNTER — Inpatient Hospital Stay: Payer: BC Managed Care – PPO | Attending: Internal Medicine

## 2021-01-28 DIAGNOSIS — R208 Other disturbances of skin sensation: Secondary | ICD-10-CM

## 2021-01-28 DIAGNOSIS — R41842 Visuospatial deficit: Secondary | ICD-10-CM

## 2021-01-28 DIAGNOSIS — R278 Other lack of coordination: Secondary | ICD-10-CM | POA: Diagnosis not present

## 2021-01-28 DIAGNOSIS — R41840 Attention and concentration deficit: Secondary | ICD-10-CM

## 2021-01-28 DIAGNOSIS — R2681 Unsteadiness on feet: Secondary | ICD-10-CM

## 2021-01-28 DIAGNOSIS — M6281 Muscle weakness (generalized): Secondary | ICD-10-CM

## 2021-01-28 MED ORDER — LACOSAMIDE 150 MG PO TABS: 150.0000 mg | ORAL_TABLET | Freq: Two times a day (BID) | ORAL | 3 refills | Status: DC

## 2021-01-28 NOTE — Patient Instructions (Signed)
   Memory Compensation Strategies  Use "WARM" strategy. W= write it down A=  associate it R=  repeat it M=  make a mental picture  You can keep a Memory Notebook. Use a 3-ring notebook with sections for the following:  calendar, important names and phone numbers, medications, doctors' names/phone numbers, "to do list"/reminders, and a section to journal what you did each day  Use a calendar to write appointments down.  Write yourself a schedule for the day/make a routine This can be placed on the calendar or in a separate section of the Memory Notebook.  Keeping a regular schedule can help memory.  Use medication organizer with sections for each day or morning/evening pills  You may need help loading it  Keep a basket, or pegboard by the door.   Place items that you need to take out with you in the basket or on the pegboard.  You may also want to include a message board for reminders.  Use sticky notes. Place sticky notes with reminders in a place where the task is performed.  For example:  "turn off the stove" placed by the stove, "lock the door" placed on the door at eye level, "take your medications" on the bathroom mirror or by the place where you normally take your medications  Use alarms/timers.  Use while cooking to remind yourself to check on food or as a reminder to take your medicine, or as a reminder to make a call, or as a reminder to perform another task, etc.  Use a voice memo to record important information and notes for yourself.  10.  Organize and reduce clutter

## 2021-01-28 NOTE — Therapy (Signed)
[facility] [address] [place], [place], [address] Phone: [phone]   Fax:  [phone]  Occupational Therapy Treatment  Patient Details  Name: Candace [name] MRN: [id number] Date of Birth: [birth year] Referring Provider (OT): Dr. Cecil Cobbs   Encounter Date: 01/28/2021   OT End of Session - 01/28/21 1023     Visit Number 7    Number of Visits 17    Date for OT Re-Evaluation 02/08/21    Authorization Type [employer]--covered 100%, 30 visit limit OT/PT, 30 speech, no auth req.    OT Start Time 1020    OT Stop Time 1100    OT Time Calculation (min) 40 min    Activity Tolerance Patient tolerated treatment well    Behavior During Therapy WFL for tasks assessed/performed             Past Medical History:  Diagnosis Date   Brain tumor [facility])    Headache     Past Surgical History:  Procedure Laterality Date   ABDOMINAL SURGERY     APPLICATION OF CRANIAL NAVIGATION Left 12/08/2019   Procedure: APPLICATION OF CRANIAL NAVIGATION;  Surgeon: Vallarie Mare, MD;  Location: [facility];  Service: Neurosurgery;  Laterality: Left;  APPLICATION OF CRANIAL NAVIGATION   FRAMELESS  BIOPSY WITH BRAINLAB Left 12/08/2019   Procedure: Left stereotactic brain biopsy with brainlab;  Surgeon: Vallarie Mare, MD;  Location: [facility];  Service: Neurosurgery;  Laterality: Left;  Left stereotactic brain biopsy with brainlab   WISDOM TOOTH EXTRACTION      There were no vitals filed for this visit.   Subjective Assessment - 01/28/21 1021     Subjective  hand and sometimes whole arm bothers me... feels kind of numb.  Put on necklaces.  I get MRI results tomorrow.  Writing is better    Pertinent History Grade III astrocytoma brain tumor (surgery/biopsy 12/08/19).  PMH:  hx of radiation/chemotherapy (03/02/20), hx of migraines, hx of seizures (hospitalized 10/28/20-11/01/20)    Limitations hx of seizures    Patient Stated Goals improve RUE  functional use    Currently in Pain? Yes    Pain Score 2     Pain Location Hand    Pain Orientation Right    Pain Descriptors / Indicators Aching;Numbness    Pain Onset More than a month ago    Pain Frequency Intermittent    Aggravating Factors  high heat with cooking    Pain Relieving Factors unknown              Placing grooved pegs in pegboard for incr in-hand manipulation with min difficulty/incr time.  Placing O'connor pegs in pegboard with tweezers with min-mod difficulty, improved with cueing/repetition for coordination  Functional reaching to place/remove clothespins with 1-8lb resistance in vertical pegboard with min cueing for shoulder compensation.  Rotating 2 balls in hand for in-hand manipulation with mod difficulty backwards.  Dealing cards with thumb with min difficulty/incr time.    Pt reports only notices visual changes when reading>distance, but hasn't been reading much.  Pt reports that she does not notice difficulty going from near>far distance with watching tv.       OT Education - 01/28/21 1143     Education Details Memory/Cognitive Compensation Strategies--see pt instructions    Person(s) Educated Patient    Methods Explanation;Handout    Comprehension Verbalized understanding                   OT Short  Term Goals - 01/25/21 1424       OT SHORT TERM GOAL #1   Title Pt will be independent with initial HEP for RUE strength/coordination.--check STGs 01/08/21    Time 4    Period Weeks    Status Achieved      OT SHORT TERM GOAL #2   Title Pt will improve coordination/functional reaching with RUE as shown by improving score on box and blocks test by at least 5.    Baseline R-36 blocks    Time 4    Period Weeks    Status Achieved   50     OT SHORT TERM GOAL #3   Title Pt will improve fine motor coordination for ADLs as shown by improving time on 9-hole peg test by at least 4 sec with dominant RUE.    Baseline 36.41sec    Time 4     Period Weeks    Status Achieved   29.75     OT SHORT TERM GOAL #4   Title Pt will improve R grip strength by at least 5lbs to assist in gripping/lifting tasks.    Baseline 64.1lbs    Time 4    Period Weeks    Status On-going   67.4-10/28/22              OT Long Term Goals - 01/25/21 1112       OT LONG TERM GOAL #1   Title Pt will be independent with updated HEP for RUE strength/coordination.--check LTGs 02/08/21    Time 8    Period Weeks    Status On-going      OT LONG TERM GOAL #2   Title Pt will verbalize understanding of adaptive strategies/AE to incr ease/safety with ADLs/IADls including precautions for sensory deficits and cognitive/visual compensation strategies.    Time 8    Period Weeks    Status On-going      OT LONG TERM GOAL #3   Title Pt will improve fine motor coordination for ADLs as shown by improving time on 9-hole peg test by at least 8 sec with dominant RUE.    Baseline 36.41sec    Time 8    Period Weeks    Status On-going      OT LONG TERM GOAL #4   Title Pt will improve coordination/functional reaching with RUE as shown by improving score on box and blocks test by at least 10.    Baseline 36    Time 8    Period Weeks    Status On-going      OT LONG TERM GOAL #5   Title Pt will verbalize understanding of visual and cognitive HEP prn.    Time 8    Period Weeks    Status On-going                   Plan - 01/28/21 1024     Clinical Impression Statement Pt is progressing towards goals with with improving coordination.    OT Occupational Profile and History Detailed Assessment- Review of Records and additional review of physical, cognitive, psychosocial history related to current functional performance    Occupational performance deficits (Please refer to evaluation for details): ADL's;IADL's;Work;Leisure    Body Structure / Function / Physical Skills ADL;Decreased knowledge of use of DME;Strength;Dexterity;Balance;Proprioception;UE  functional use;IADL;ROM;Endurance;Vision;Sensation;Mobility;Coordination;FMC;Decreased knowledge of precautions    Cognitive Skills Attention;Memory    Rehab Potential Good    Clinical Decision Making Several treatment options, min-mod task modification necessary  Comorbidities Affecting Occupational Performance: May have comorbidities impacting occupational performance    Modification or Assistance to Complete Evaluation  Min-Moderate modification of tasks or assist with assess necessary to complete eval    OT Frequency 2x / week    OT Duration 8 weeks   +eval   OT Treatment/Interventions Self-care/ADL training;Moist Heat;Fluidtherapy;DME and/or AE instruction;Balance training;Therapeutic activities;Aquatic Therapy;Therapeutic exercise;Cognitive remediation/compensation;Visual/perceptual remediation/compensation;Functional Mobility Training;Neuromuscular education;Cryotherapy;Energy conservation;Manual Therapy;Patient/family education    Plan continue to address RUE functional use, work towards long term goals    Consulted and Agree with Plan of Care Patient             Patient will benefit from skilled therapeutic intervention in order to improve the following deficits and impairments:   Body Structure / Function / Physical Skills: ADL, Decreased knowledge of use of DME, Strength, Dexterity, Balance, Proprioception, UE functional use, IADL, ROM, Endurance, Vision, Sensation, Mobility, Coordination, FMC, Decreased knowledge of precautions Cognitive Skills: Attention, Memory     Visit Diagnosis: Other lack of coordination  Muscle weakness (generalized)  Other disturbances of skin sensation  Visuospatial deficit  Attention and concentration deficit  Unsteadiness on feet    Problem List Patient Active Problem List   Diagnosis Date Noted   Seizure ([place]) 10/28/2020   Primary malignant neoplasm of frontal lobe ([place]) 01/13/2020   Focal seizure ([place]) 12/07/2019   Astrocytoma  of frontal lobe ([place]) 12/07/2019    Terry Bolotin, OT/L 01/28/2021, 12:28 PM  [facility] [address] [place], [place], [address] Phone: [phone]   Fax:  [phone]  Name: Sheral Pfahler MRN: [id number] Date of Birth: [birth year]   Vianne Bulls, OTR/L [facility] [address] [place], [place]  [address] [phone] phone [phone] 01/28/21 12:28 PM

## 2021-01-29 ENCOUNTER — Other Ambulatory Visit: Payer: Self-pay

## 2021-01-29 ENCOUNTER — Inpatient Hospital Stay: Payer: BC Managed Care – PPO | Attending: Internal Medicine | Admitting: Internal Medicine

## 2021-01-29 DIAGNOSIS — Z808 Family history of malignant neoplasm of other organs or systems: Secondary | ICD-10-CM | POA: Insufficient documentation

## 2021-01-29 DIAGNOSIS — R4781 Slurred speech: Secondary | ICD-10-CM | POA: Insufficient documentation

## 2021-01-29 DIAGNOSIS — R200 Anesthesia of skin: Secondary | ICD-10-CM | POA: Diagnosis not present

## 2021-01-29 DIAGNOSIS — R569 Unspecified convulsions: Secondary | ICD-10-CM

## 2021-01-29 DIAGNOSIS — R269 Unspecified abnormalities of gait and mobility: Secondary | ICD-10-CM | POA: Diagnosis not present

## 2021-01-29 DIAGNOSIS — R531 Weakness: Secondary | ICD-10-CM | POA: Diagnosis not present

## 2021-01-29 DIAGNOSIS — Z79899 Other long term (current) drug therapy: Secondary | ICD-10-CM | POA: Insufficient documentation

## 2021-01-29 DIAGNOSIS — R55 Syncope and collapse: Secondary | ICD-10-CM | POA: Diagnosis not present

## 2021-01-29 DIAGNOSIS — Z880 Allergy status to penicillin: Secondary | ICD-10-CM | POA: Diagnosis not present

## 2021-01-29 DIAGNOSIS — C711 Malignant neoplasm of frontal lobe: Secondary | ICD-10-CM | POA: Diagnosis not present

## 2021-01-29 NOTE — Progress Notes (Signed)
[facility] [place], [place] [address] [phone]   Interval Evaluation  Date of Service: 01/29/21 Patient Name: Candace [name] Patient MRN: [id number] Patient DOB: May 16, 1980 Provider: Ventura Sellers, MD  Identifying Statement:  Candace [name] is a 40 y.o. female with left temporal  grade 3 astrocytoma     Oncologic History: Oncology History  Astrocytoma of frontal lobe ([place])  12/08/2019 Surgery   Stereotactic R temporal biopsy by Dr. Marcello Moores.  Path demonstrates Astrocytoma, IDH-mt, WHO grade 3   01/17/2020 - 03/02/2020 Radiation Therapy   IMRT and concurrent Temodar with Dr. Isidore Moos     Biomarkers:  MGMT Unknown.  IDH 1/2 Mutated.  EGFR Unknown  TERT Unknown   Interval History:  Candace [name] presents today for follow up after recent MRI brain.  She describes overall stability with gait, right sided weakness.  Her language impairments are about the same as last month.  Decadron is currently at 56m BID since the beginning of October.  Sleep has improved with medication changes.  Decadron 12/29/20: 460m11/05/22: 5m38mH+P (12/23/19) Patient presented to medical attention early in September with first ever seizure.  Event was described as "sudden onset left arm numbness" followed by "face twisting, and loss of consciousness for at least 30 minutes".  CNS imaging demonstrated a large left frontal non enhancing mass consistent with primary brain tumor.  She underwent biopsy on 12/08/19 with Dr. ThoMarcello MooresFollowing surgery she has some slurring of speech but no other significant complaints, returned to prior baseline upon discharge.  Now home, she has had no breathrough events and no focal neurologic complaints.    Medications: Current Outpatient Medications on File Prior to Visit  Medication Sig Dispense Refill   famotidine (PEPCID) 20 MG tablet Take 1 tablet (20 mg total) by mouth daily. (Patient not taking: Reported  on 12/10/2020) 30 tablet 0   GENISTEIN PO Take 1 tablet by mouth at bedtime.     ibuprofen (ADVIL) 200 MG tablet Take 600 mg by mouth every 6 (six) hours as needed for headache. (Patient not taking: Reported on 12/10/2020)     Lacosamide 150 MG TABS Take 1 tablet (150 mg total) by mouth 2 (two) times daily. 60 tablet 3   levETIRAcetam (KEPPRA) 1000 MG tablet Take 2 tablets (2,000 mg total) by mouth 2 (two) times daily. 120 tablet 3   LYCOPENE PO Take 1 capsule by mouth daily.     MELATONIN PO Take 1 tablet by mouth at bedtime.     MILK THISTLE EXTRACT PO Take 1 capsule by mouth at bedtime. Silibinin     mirtazapine (REMERON) 7.5 MG tablet Take 1 tablet (7.5 mg total) by mouth at bedtime. (Patient not taking: Reported on 12/10/2020) 30 tablet 3   Omega-3 Fatty Acids (FISH OIL PO) Take 6 tablets by mouth daily.     OVER THE COUNTER MEDICATION Take 1 capsule by mouth daily. [place] mushroom     OVER THE COUNTER MEDICATION Take 1 tablet by mouth daily. Berberine     OVER THE COUNTER MEDICATION Take 1 capsule by mouth daily. Frankincense     RESVERATROL PO Take 1 capsule by mouth at bedtime.     TURMERIC PO Take 1 tablet by mouth daily.     No current facility-administered medications on file prior to visit.    Allergies:  Allergies  Allergen Reactions   Trazodone And Nefazodone Itching   Penicillins Rash  Past Medical History:  Past Medical History:  Diagnosis Date   Brain tumor [facility])    Headache    Past Surgical History:  Past Surgical History:  Procedure Laterality Date   ABDOMINAL SURGERY     APPLICATION OF CRANIAL NAVIGATION Left 12/08/2019   Procedure: APPLICATION OF CRANIAL NAVIGATION;  Surgeon: Vallarie Mare, MD;  Location: [facility];  Service: Neurosurgery;  Laterality: Left;  APPLICATION OF CRANIAL NAVIGATION   FRAMELESS  BIOPSY WITH BRAINLAB Left 12/08/2019   Procedure: Left stereotactic brain biopsy with brainlab;  Surgeon: Vallarie Mare, MD;  Location: [facility];  Service:  Neurosurgery;  Laterality: Left;  Left stereotactic brain biopsy with brainlab   WISDOM TOOTH EXTRACTION     Social History:  Social History   Socioeconomic History   Marital status: Married    Spouse name: Not on file   Number of children: Not on file   Years of education: Not on file   Highest education level: Not on file  Occupational History   Not on file  Tobacco Use   Smoking status: Never   Smokeless tobacco: Never  Substance and Sexual Activity   Alcohol use: Not Currently   Drug use: Never   Sexual activity: Not on file  Other Topics Concern   Not on file  Social History Narrative   Not on file   Social Determinants of Health   Financial Resource Strain: Not on file  Food Insecurity: Not on file  Transportation Needs: Not on file  Physical Activity: Not on file  Stress: Not on file  Social Connections: Not on file  Intimate Partner Violence: Not on file   Family History:  Family History  Problem Relation Age of Onset   Migraines Mother    Thyroid cancer Father    Testicular cancer Father     Review of Systems: Constitutional: Doesn't report fevers, chills or abnormal weight loss Eyes: Doesn't report blurriness of vision Ears, nose, mouth, throat, and face: Doesn't report sore throat Respiratory: Doesn't report cough, dyspnea or wheezes Cardiovascular: Doesn't report palpitation, chest discomfort  Gastrointestinal:  Doesn't report nausea, constipation, diarrhea GU: Doesn't report incontinence Skin: Doesn't report skin rashes Neurological: Per HPI Musculoskeletal: Doesn't report joint pain Behavioral/Psych: +anxiety  Physical Exam: Wt Readings from Last 3 Encounters:  11/27/20 132 lb 14.4 oz (60.3 kg)  11/05/20 132 lb 8 oz (60.1 kg)  10/31/20 134 lb 14.7 oz (61.2 kg)   Temp Readings from Last 3 Encounters:  11/27/20 97.8 F (36.6 C) (Oral)  11/05/20 98.9 F (37.2 C) (Oral)  11/01/20 97.8 F (36.6 C) (Oral)   BP Readings from Last 3  Encounters:  01/25/21 131/76  11/27/20 134/90  11/05/20 115/73   Pulse Readings from Last 3 Encounters:  01/25/21 (!) 55  11/27/20 73  11/05/20 80    KPS: 70. General: Alert, cooperative, pleasant, in no acute distress Head: Normal EENT: No conjunctival injection or scleral icterus.  Lungs: Resp effort normal Cardiac: Regular rate Abdomen: Non-distended abdomen Skin: No rashes cyanosis or petechiae. Extremities: No clubbing or edema  Neurologic Exam: Mental Status: Awake, alert, attentive to examiner. Oriented to self and environment. Expressive dysphasia Cranial Nerves: Visual acuity is grossly normal. Visual fields are full. Extra-ocular movements intact. No ptosis. Face is symmetric Motor: Tone and bulk are normal. Right arm 5/5 with subtly impaired fine motor coordination, R leg 4+/5. Reflexes are symmetric, no pathologic reflexes present.  Sensory: Intact to light touch Gait: Hemiparetic   Labs: I  have reviewed the data as listed    Component Value Date/Time   NA 136 10/29/2020 0158   K 3.9 10/29/2020 0158   CL 108 10/29/2020 0158   CO2 19 (L) 10/29/2020 0158   GLUCOSE 121 (H) 10/29/2020 0158   BUN 16 10/29/2020 0158   CREATININE 0.74 10/29/2020 0158   CREATININE 0.99 08/20/2020 1205   CALCIUM 8.8 (L) 10/29/2020 0158   PROT 6.7 10/28/2020 1128   ALBUMIN 3.8 10/28/2020 1128   AST 15 10/28/2020 1128   AST 14 (L) 08/20/2020 1205   ALT 14 10/28/2020 1128   ALT 16 08/20/2020 1205   ALKPHOS 41 10/28/2020 1128   BILITOT 0.7 10/28/2020 1128   BILITOT 0.6 08/20/2020 1205   GFRNONAA >60 10/29/2020 0158   GFRNONAA >60 08/20/2020 1205   GFRAA >60 12/09/2019 0623   Lab Results  Component Value Date   WBC 8.5 10/29/2020   NEUTROABS 5.3 10/28/2020   HGB 12.7 10/29/2020   HCT 34.9 (L) 10/29/2020   MCV 92.1 10/29/2020   PLT 173 10/29/2020   Imaging:  [place] Clinician Interpretation: I have personally reviewed the CNS images as listed.  My interpretation, in the  context of the patient's clinical presentation, is treatment effect vs true progression  MR BRAIN W WO CONTRAST  Result Date: 01/26/2021 CLINICAL DATA:  Brain/CNS neoplasm, assess treatment response; grade 3 astrocytoma EXAM: MRI HEAD WITHOUT AND WITH CONTRAST TECHNIQUE: Multiplanar, multiecho pulse sequences of the brain and surrounding structures were obtained without and with intravenous contrast. CONTRAST:  48m GADAVIST GADOBUTROL 1 MMOL/ML IV SOLN COMPARISON:  11/25/2020 FINDINGS: Brain: No substantial change in extent of infiltrative T2 FLAIR hyperintensity in the left cerebral hemisphere involving temporal lobe, deep white matter and insula, frontal lobe, and parietal lobe. Similar size of cystic lesion of the left frontal lobe with signal intensity greater than CSF and thin peripheral enhancement. Ill-defined enhancement deep and inferior to the cystic lesion extending to the margin of the body of the left lateral ventricle is again noted. Enhancement has slightly increased (for example at the posterior and deep margins on series 16. Similar mild regional mass effect. No acute infarction. No hydrocephalus. Small right frontal developmental venous anomaly. Vascular: Major vessel flow voids at the skull base are preserved. Skull and upper cervical spine: Normal marrow signal is preserved. Sinuses/Orbits: Paranasal sinuses are aerated. Orbits are unremarkable. Other: Sella is unremarkable.  Mastoid air cells are clear. IMPRESSION: Since 11/25/2020, Similar extent of abnormal left cerebral T2 FLAIR hyperintensity. Slight increase in ill-defined enhancement. Electronically Signed   By: PMacy MisM.D.   On: 01/26/2021 16:16     Assessment/Plan Astrocytoma of frontal lobe (HCC) [C71.1]  JCahterine Heinzelis clinically stable today.  Only one minor event likely consistent with breakthrough seizure over the past month.  MRI brain demonstrates increased burden of enhancement when compared to August  study.  However, enhancement is stable when compared to June.  Patient in August was on 165mdaily dexamethasone, which can impact enhancing volume, thus possibly obscuring this result.  Notably, T2/FLAIR signal intensity and mass effect are both stable going back to June.  The cyst is larger than it was in April, but unchanged from August study.  This constellation of findings is suggestive of stability from an oncologic standpoint.  It is true that overall enhancement and cyst volume are somewhat increased when compared to April, but this change may be driven by increase in cyst volume over the summer, which is now stabilized.  We had considered resection of the cyst, but at this time will continue to monitor.  We recommended continuing Keppra 2060m BID, Vimpat 1576mBID for now.  Decadron should be decreased to 29m58maily if tolerated.  We ask that Candace Yaunturn to clinic in 6 weeks following next brain MRI, or sooner as needed.  She is agreeable with this plan.  All questions were answered. The patient knows to call the clinic with any problems, questions or concerns. No barriers to learning were detected.  The total time spent in the encounter was 40 minutes and more than 50% was on counseling and review of test results   ZacVentura SellersD Medical Director of Neuro-Oncology [facility] [place]/01/22 10:40 AM

## 2021-01-30 ENCOUNTER — Ambulatory Visit: Payer: BC Managed Care – PPO | Admitting: Speech Pathology

## 2021-01-30 ENCOUNTER — Encounter: Payer: Self-pay | Admitting: Speech Pathology

## 2021-01-30 ENCOUNTER — Ambulatory Visit: Payer: BC Managed Care – PPO | Attending: Internal Medicine | Admitting: Occupational Therapy

## 2021-01-30 DIAGNOSIS — R4701 Aphasia: Secondary | ICD-10-CM | POA: Diagnosis present

## 2021-01-30 DIAGNOSIS — R2681 Unsteadiness on feet: Secondary | ICD-10-CM | POA: Insufficient documentation

## 2021-01-30 DIAGNOSIS — R208 Other disturbances of skin sensation: Secondary | ICD-10-CM | POA: Insufficient documentation

## 2021-01-30 DIAGNOSIS — M6281 Muscle weakness (generalized): Secondary | ICD-10-CM | POA: Insufficient documentation

## 2021-01-30 DIAGNOSIS — R41842 Visuospatial deficit: Secondary | ICD-10-CM | POA: Insufficient documentation

## 2021-01-30 DIAGNOSIS — R278 Other lack of coordination: Secondary | ICD-10-CM | POA: Diagnosis not present

## 2021-01-30 DIAGNOSIS — R482 Apraxia: Secondary | ICD-10-CM

## 2021-01-30 DIAGNOSIS — R41840 Attention and concentration deficit: Secondary | ICD-10-CM | POA: Diagnosis present

## 2021-01-30 NOTE — Patient Instructions (Addendum)
Keeping Thinking Skills Sharp: 1. Jigsaw puzzles 2. Card/board games 3. Talking on the phone/social events 4. [email or web address] 5. Online games 6. Word searches/crossword puzzles 7.  Logic puzzles 8. Aerobic exercise (stationary bike) 9. Eating balanced diet (fruits & veggies) 10. Drink water 11. Try something new--new recipe, hobby 12. Crafts 13. Do a variety of activities that are challenging                   Vision strategies:  Take a rest break when vision becomes blurry  Wear readers  Limit how long you read and use larger print.

## 2021-01-30 NOTE — Therapy (Signed)
[facility] [address] [place], [place], [address] Phone: [phone]   Fax:  [phone]  Speech Language Pathology Treatment & Discharge Summary  Patient Details  Name: Candace [name] MRN: [id number] Date of Birth: [birth year] Referring Provider (SLP): Dr. Cecil Cobbs   Encounter Date: 01/30/2021   End of Session - 01/30/21 1336     Visit Number 8    Number of Visits 25    Date for SLP Re-Evaluation 03/04/21    Authorization Type 30 VL ST; 30 VL PT/OT    Authorization - Visit Number 8    Authorization - Number of Visits 30    SLP Start Time 1232    SLP Stop Time  6010    SLP Time Calculation (min) 43 min    Activity Tolerance Patient tolerated treatment well             Past Medical History:  Diagnosis Date   Brain tumor [facility])    Headache     Past Surgical History:  Procedure Laterality Date   ABDOMINAL SURGERY     APPLICATION OF CRANIAL NAVIGATION Left 12/08/2019   Procedure: APPLICATION OF CRANIAL NAVIGATION;  Surgeon: Vallarie Mare, MD;  Location: [place];  Service: Neurosurgery;  Laterality: Left;  APPLICATION OF CRANIAL NAVIGATION   FRAMELESS  BIOPSY WITH BRAINLAB Left 12/08/2019   Procedure: Left stereotactic brain biopsy with brainlab;  Surgeon: Vallarie Mare, MD;  Location: [facility];  Service: Neurosurgery;  Laterality: Left;  Left stereotactic brain biopsy with brainlab   WISDOM TOOTH EXTRACTION      There were no vitals filed for this visit.   Subjective Assessment - 01/30/21 1245     Subjective "It's bette but I'm sure that is from the sterriods"    Currently in Pain? Yes    Pain Score 2     Pain Location Hand    Pain Orientation Right    Pain Descriptors / Indicators Aching;Numbness    Pain Type Chronic pain                   ADULT SLP TREATMENT - 01/30/21 1251       General Information   Behavior/Cognition Alert;Cooperative;Pleasant mood      Treatment  Provided   Treatment provided Cognitive-Linquistic      Cognitive-Linquistic Treatment   Treatment focused on Aphasia;Apraxia;Patient/family/caregiver education    Skilled Treatment Candace [name] reports speech and language is better, due to steriods. Speech fluent, Candace [name] independently used syllabalization to accurately to produce multisyllabic words in conversation. In structured task repeating and generating sentences with multisyllabic words carrying over strategies with mod I to maintian fluent speech and she ID'd and self corrected errors with mod I She continues to note minimal drool which she senses and clears, and minimal buccal residue she is clearing independently. She continues to have some difficulty getting a turn in a group conversation, but this has improved slightly. Adminstered Communicative Participation Item Bank Short form. Candace [name] improved in 7/10 areas  -exception were getting a turn in a fast moving converation, talking with strangers and trying to perusade another to see a different point of view.      Assessment / Recommendations / Plan   Plan Discharge SLP treatment due to (comment)      Progression Toward Goals   Progression toward goals Goals met, education completed, patient discharged from SLP              SLP Education -  01/30/21 1329     Education Details Continue HEP, energy conservation,    Person(s) Educated Patient    Methods Explanation    Comprehension Verbalized understanding;Returned demonstration            SPEECH THERAPY DISCHARGE SUMMARY  Visits from Start of Care: 8  Current functional level related to goals / functional outcomes: See goals below   Remaining deficits: Memory, attention, processing, mild verbal apraxia   Education / Equipment: HEP for apraxia, compensations for apraxia, compensations for cognition   Patient agrees to discharge. Patient goals were partially met. Patient is being discharged due to being pleased with the  current functional level.Marland Kitchen         SLP Short Term Goals - 01/30/21 1315       SLP SHORT TERM GOAL #1   Title Pt will be independent in HEP for verbal apraxia over 3 sessions    Baseline 01-02-21    Time 4    Period Weeks    Status Achieved      SLP SHORT TERM GOAL #2   Title Pt will ID and self correct aphasic or apraxic errors in structured speech tasks and simple conversation 4/5 opportunities with occasional  min A    Baseline 01-02-21    Time 4    Period Weeks    Status Achieved      SLP SHORT TERM GOAL #3   Title Pt will carryover 2 strategies for attention to read and comprehend 3 paragraph passage with occasional min A    Time 4    Period Weeks    Status Deferred      SLP SHORT TERM GOAL #4   Title Pt will use external aids to recall daily tasks to report forgetting 3 or less tasks a week for 2 weeks    Time 4    Period Weeks    Status Not Met              SLP Long Term Goals - 01/30/21 1317       SLP LONG TERM GOAL #1   Title Pt will use WNL rate of speech in simple conversation over 5 minutes with occasional min A over 2 sessions    Time 10    Period Weeks    Status Achieved      SLP LONG TERM GOAL #2   Title Pt will ID and self correct all aphasic and apraxic errors during 15 minute conversation with rare min A    Time 10    Period Weeks    Status Achieved      SLP LONG TERM GOAL #3   Title Pt will improve score on The Communicative Participation Item Bank Short Form by 3 points (original score 13)    Baseline 15    Time 10    Period Weeks    Status Partially Met      SLP LONG TERM GOAL #4   Title Pt will carryover compensations for slow processing, attention to participate in small group conversation successfully 3x outside of ST per her report    Time 10    Period Weeks    Status Achieved      SLP LONG TERM GOAL #5   Title Pt will correct grammar/aphasic errors on texts with mod I over 3 sessions    Time 10    Period Weeks    Status  Achieved  Plan - 01/30/21 1330     Clinical Impression Statement Candace [name] speech and langugae have improved steadily. She believes the steriods have helped. Minimal apraxic erors, wihich she independently self corrects. Candace [name] has consistently carried ove reduced rate of speech and syllablization. She improved score on PROM by 2 points. She has increased social interaction and is making phone calls as needed with success. She continues to have mild attention, memory and processing impairments. At this time, Goals partially met. Candace [name] is pleased with her current function and agrees to d/c ST. I am in agreement. She verbalizes awareness that she can return to ST if speech. language or cognition worsen.    Speech Therapy Frequency 2x / week    Duration 12 weeks    Treatment/Interventions Language facilitation;Environmental controls;Cueing hierarchy;Compensatory strategies;Functional tasks;Cognitive reorganization;Compensatory techniques;Patient/family education;Multimodal communcation approach;Internal/external aids;SLP instruction and feedback    Potential to Achieve Goals Good             Patient will benefit from skilled therapeutic intervention in order to improve the following deficits and impairments:   Verbal apraxia  Aphasia    Problem List Patient Active Problem List   Diagnosis Date Noted   Seizure ([place]) 10/28/2020   Primary malignant neoplasm of frontal lobe ([place]) 01/13/2020   Focal seizure ([place]) 12/07/2019   Astrocytoma of frontal lobe ([place]) 12/07/2019    Candace [name], Candace Rusk MS, CCC-SLP 01/30/2021, 1:37 PM  Candace [name] [address] [place], [place], [address] Phone: [phone]   Fax:  [phone]   Name: Candace [name] MRN: [id number] Date of Birth: 03-23-1981

## 2021-01-31 NOTE — Therapy (Signed)
[facility] [address] [place], [place], [address] Phone: [phone]   Fax:  [phone]  Occupational Therapy Treatment  Patient Details  Name: Candace [name] MRN: [id number] Date of Birth: 03-27-1981 Referring Provider (OT): Dr. Cecil Cobbs   Encounter Date: 01/30/2021   OT End of Session - 01/30/21 1350     Visit Number 8    Number of Visits 17    Date for OT Re-Evaluation 02/08/21    Authorization Type [employer]--covered 100%, 30 visit limit OT/PT, 30 speech, no auth req.    OT Start Time 1322    OT Stop Time 1400    OT Time Calculation (min) 38 min    Activity Tolerance Patient tolerated treatment well    Behavior During Therapy WFL for tasks assessed/performed             Past Medical History:  Diagnosis Date   Brain tumor [facility])    Headache     Past Surgical History:  Procedure Laterality Date   ABDOMINAL SURGERY     APPLICATION OF CRANIAL NAVIGATION Left 12/08/2019   Procedure: APPLICATION OF CRANIAL NAVIGATION;  Surgeon: Vallarie Mare, MD;  Location: [place];  Service: Neurosurgery;  Laterality: Left;  APPLICATION OF CRANIAL NAVIGATION   FRAMELESS  BIOPSY WITH BRAINLAB Left 12/08/2019   Procedure: Left stereotactic brain biopsy with brainlab;  Surgeon: Vallarie Mare, MD;  Location: [place];  Service: Neurosurgery;  Laterality: Left;  Left stereotactic brain biopsy with brainlab   WISDOM TOOTH EXTRACTION      There were no vitals filed for this visit.   Subjective Assessment - 01/31/21 1050     Subjective  pt requests to d/c today    Pertinent History Grade III astrocytoma brain tumor (surgery/biopsy 12/08/19).  PMH:  hx of radiation/chemotherapy (03/02/20), hx of migraines, hx of seizures (hospitalized 10/28/20-11/01/20)    Patient Stated Goals improve RUE functional use    Currently in Pain? Yes    Pain Score 2     Pain Location Hand    Pain Orientation Right    Pain Descriptors /  Indicators Aching    Pain Type Chronic pain    Pain Onset More than a month ago    Pain Frequency Intermittent    Aggravating Factors  unknown    Pain Relieving Factors unknown                Treatment: Therapist checked progress towards goals as pt requests d/c today. Pt reports she has not tried painting. Pt was tearful and encouraged to paint at home for herself.  Pt practiced drawing using a pencil wrapped with coban, pt demo good accuracy.  Coban provided for home.                  OT Education - 01/31/21 1051     Education Details keeping thinking skills sharp/ activities for cognition, vision compensation strategies    Person(s) Educated Patient    Methods Explanation;Handout;Demonstration;Verbal cues    Comprehension Verbalized understanding              OT Short Term Goals - 01/30/21 1350       OT SHORT TERM GOAL #1   Title Pt will be independent with initial HEP for RUE strength/coordination.--check STGs 01/08/21    Time 4    Period Weeks    Status Achieved      OT SHORT TERM GOAL #2   Title Pt will improve  coordination/functional reaching with RUE as shown by improving score on box and blocks test by at least 5.    Baseline R-36 blocks    Time 4    Period Weeks    Status Achieved   50     OT SHORT TERM GOAL #3   Title Pt will improve fine motor coordination for ADLs as shown by improving time on 9-hole peg test by at least 4 sec with dominant RUE.    Baseline 36.41sec    Time 4    Period Weeks    Status Achieved   29.75     OT SHORT TERM GOAL #4   Title Pt will improve R grip strength by at least 5lbs to assist in gripping/lifting tasks.    Baseline 64.1lbs    Time 4    Period Weeks    Status Achieved   74.9 lbs              OT Long Term Goals - 01/30/21 1345       OT LONG TERM GOAL #1   Title Pt will be independent with updated HEP for RUE strength/coordination.--check LTGs 02/08/21    Time 8    Period Weeks     Status Achieved   green putty, and coordination exercises are appropriate     OT LONG TERM GOAL #2   Title Pt will verbalize understanding of adaptive strategies/AE to incr ease/safety with ADLs/IADls including precautions for sensory deficits and cognitive/visual compensation strategies.    Time 8    Period Weeks    Status Achieved      OT LONG TERM GOAL #3   Title Pt will improve fine motor coordination for ADLs as shown by improving time on 9-hole peg test by at least 8 sec with dominant RUE.    Baseline 36.41sec    Time 8    Period Weeks    Status Not Met   29.75-, improved but not fully achieved     OT LONG TERM GOAL #4   Title Pt will improve coordination/functional reaching with RUE as shown by improving score on box and blocks test by at least 10.    Baseline 36    Time 8    Period Weeks    Status Achieved   50 blocks     OT LONG TERM GOAL #5   Title Pt will verbalize understanding of visual and cognitive HEP prn.    Time 8    Period Weeks    Status Partially Met   met for cognitve activities                  Plan - 01/31/21 1221     Clinical Impression Statement Pt demonstrates overall progress towards goals. She requests early d/c and therefore goals are not fully achieved.    OT Occupational Profile and History Detailed Assessment- Review of Records and additional review of physical, cognitive, psychosocial history related to current functional performance    Occupational performance deficits (Please refer to evaluation for details): ADL's;IADL's;Work;Leisure    Body Structure / Function / Physical Skills ADL;Decreased knowledge of use of DME;Strength;Dexterity;Balance;Proprioception;UE functional use;IADL;ROM;Endurance;Vision;Sensation;Mobility;Coordination;FMC;Decreased knowledge of precautions    Cognitive Skills Attention;Memory    Rehab Potential Good    Clinical Decision Making Several treatment options, min-mod task modification necessary     Comorbidities Affecting Occupational Performance: May have comorbidities impacting occupational performance    Modification or Assistance to Complete Evaluation  Min-Moderate modification of tasks or assist  with assess necessary to complete eval    OT Frequency 2x / week    OT Duration 8 weeks   +eval   OT Treatment/Interventions Self-care/ADL training;Moist Heat;Fluidtherapy;DME and/or AE instruction;Balance training;Therapeutic activities;Aquatic Therapy;Therapeutic exercise;Cognitive remediation/compensation;Visual/perceptual remediation/compensation;Functional Mobility Training;Neuromuscular education;Cryotherapy;Energy conservation;Manual Therapy;Patient/family education    Plan d/c OT per pt request    Consulted and Agree with Plan of Care Patient             Patient will benefit from skilled therapeutic intervention in order to improve the following deficits and impairments:   Body Structure / Function / Physical Skills: ADL, Decreased knowledge of use of DME, Strength, Dexterity, Balance, Proprioception, UE functional use, IADL, ROM, Endurance, Vision, Sensation, Mobility, Coordination, FMC, Decreased knowledge of precautions Cognitive Skills: Attention, Memory  OCCUPATIONAL THERAPY DISCHARGE SUMMARY    Current functional level related to goals / functional outcomes: Pt made good overall progress. She did not  fully meet goals due to early d/c but she made good overall progress.   Remaining deficits: Decreased RUE strength  coordination and sensation   Education / Equipment: Pt was instructed in HEP, compensation for visual deficits, and cognitive  activities, and precautions related to sensory deficits. Pt verbalized understanding of all education  Patient agrees to discharge. Patient goals were partially met. Patient is being discharged due to the patient's request..      Visit Diagnosis: Other lack of coordination  Muscle weakness (generalized)  Other disturbances  of skin sensation  Visuospatial deficit  Attention and concentration deficit  Unsteadiness on feet    Problem List Patient Active Problem List   Diagnosis Date Noted   Seizure ([facility]) 10/28/2020   Primary malignant neoplasm of frontal lobe ([place]) 01/13/2020   Focal seizure ([place]) 12/07/2019   Astrocytoma of frontal lobe ([place]) 12/07/2019    Jonnie Kubly, OT/L 01/31/2021, 12:25 PM Theone Murdoch, OTR/L [phone] Phone: [phone] 12:29 PM 01/31/21  [facility] [address] [place], [place], [address] Phone: [phone]   Fax:  [phone]  Name: Massie Cogliano MRN: [id number] Date of Birth: 02-23-1981

## 2021-02-04 ENCOUNTER — Ambulatory Visit: Payer: BC Managed Care – PPO | Admitting: Occupational Therapy

## 2021-02-04 ENCOUNTER — Encounter: Payer: BC Managed Care – PPO | Admitting: Speech Pathology

## 2021-02-06 ENCOUNTER — Encounter: Payer: BC Managed Care – PPO | Admitting: Speech Pathology

## 2021-02-06 ENCOUNTER — Encounter: Payer: BC Managed Care – PPO | Admitting: Occupational Therapy

## 2021-02-24 ENCOUNTER — Other Ambulatory Visit: Payer: Self-pay | Admitting: Internal Medicine

## 2021-02-25 ENCOUNTER — Encounter: Payer: Self-pay | Admitting: Internal Medicine

## 2021-03-03 ENCOUNTER — Other Ambulatory Visit: Payer: Self-pay | Admitting: Internal Medicine

## 2021-03-04 ENCOUNTER — Encounter: Payer: Self-pay | Admitting: Internal Medicine

## 2021-03-07 ENCOUNTER — Other Ambulatory Visit: Payer: Self-pay | Admitting: Radiation Therapy

## 2021-03-13 ENCOUNTER — Encounter: Payer: Self-pay | Admitting: Internal Medicine

## 2021-03-14 ENCOUNTER — Encounter (HOSPITAL_COMMUNITY): Payer: Self-pay

## 2021-03-14 ENCOUNTER — Ambulatory Visit (HOSPITAL_COMMUNITY)
Admission: RE | Admit: 2021-03-14 | Discharge: 2021-03-14 | Disposition: A | Discharge status: Home / Self Care | Payer: BC Managed Care – PPO | Source: Ambulatory Visit | Attending: Internal Medicine | Admitting: Internal Medicine

## 2021-03-14 DIAGNOSIS — C711 Malignant neoplasm of frontal lobe: Secondary | ICD-10-CM | POA: Diagnosis present

## 2021-03-14 IMAGING — MR MR HEAD WO/W CM
13 series · 48 of 48 positions shown · IV contrast (gadavist)
Comparison: [DATE]

CLINICAL DATA: Brain tumor and headache.  Assess treatment response

EXAM:
MRI HEAD WITHOUT AND WITH CONTRAST
TECHNIQUE: Multiplanar, multiecho pulse sequences of the brain and surrounding
structures were obtained without and with intravenous contrast.
CONTRAST:  6mL GADAVIST GADOBUTROL 1 MMOL/ML IV SOLN

[Series 5: DWI · axial · 3.0mm · 1.36mm/px · z∈[-67,+92]mm · 5 of 107 slices shown (1 of 2)]
[im 1/107]
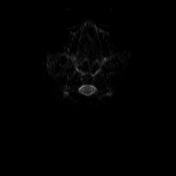
[im 27/107]
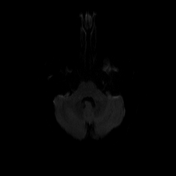
[im 54/107]
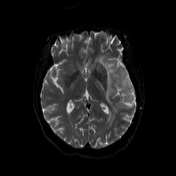
[im 80/107]
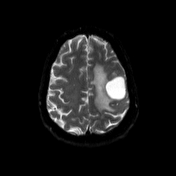
[im 107/107]
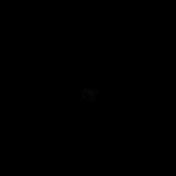

[Series 6: DWI · axial · 3.0mm · 1.36mm/px · z∈[-67,+92]mm · 3 of 54 slices shown (2 of 2)]
[im 1/54]
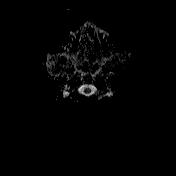
[im 27/54]
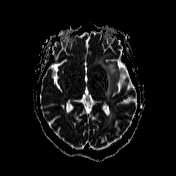
[im 54/54]
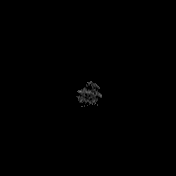

[Series 7: T1 · sagittal · 5.0mm · 0.75mm/px · 2 of 26 slices shown (1 of 2)]
[im 1/26]
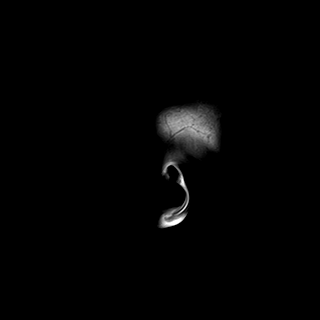
[im 26/26]
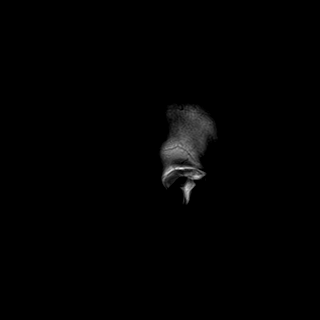

[Series 8: T2 · axial · 5.0mm · 0.62mm/px · 1 of 25 slices shown]
[im 1/25]
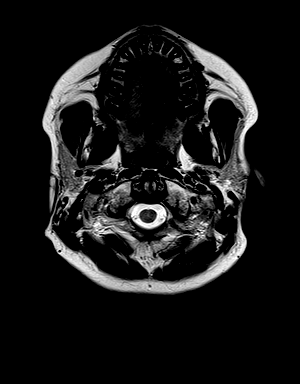

[Series 9: swi_images · axial · 3.0mm · 0.75mm/px · z∈[-59,+93]mm · 3 of 52 slices shown]
[im 1/52]
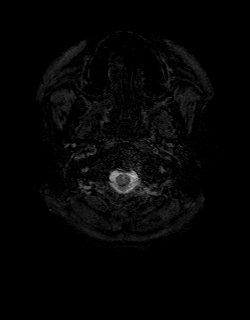
[im 26/52]
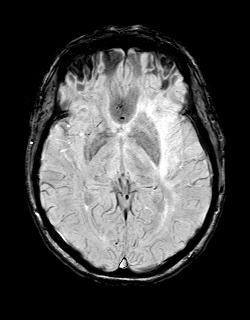
[im 52/52]
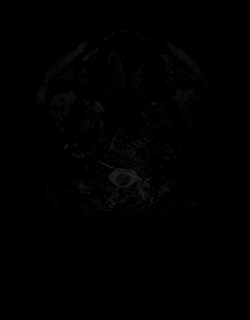

[Series 11: FLAIR · axial · 3.0mm · 0.75mm/px · z∈[-60,+93]mm · 3 of 52 slices shown]
[im 1/52]
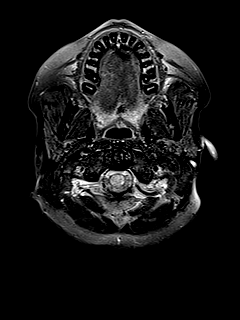
[im 26/52]
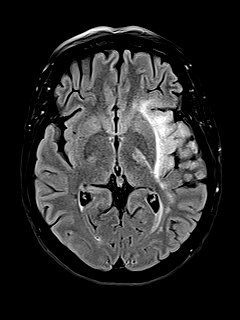
[im 52/52]
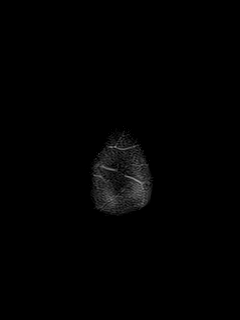

[Series 12: T1 · axial · 1.0mm · 0.94mm/px · z∈[-68,+91]mm · 10 of 160 slices shown (2 of 2)]
[im 1/160]
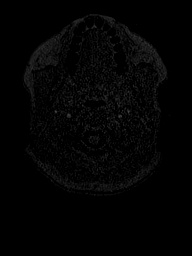
[im 18/160]
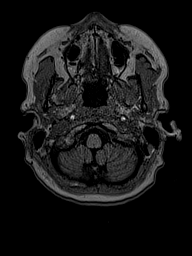
[im 36/160]
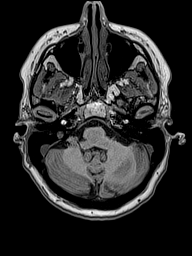
[im 54/160]
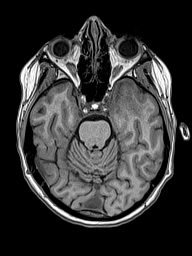
[im 71/160]
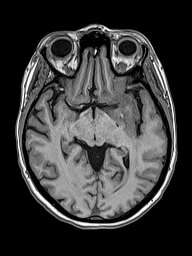
[im 89/160]
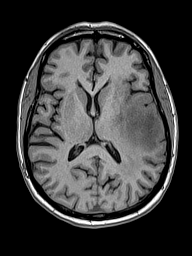
[im 107/160]
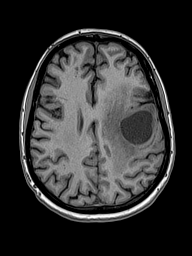
[im 124/160]
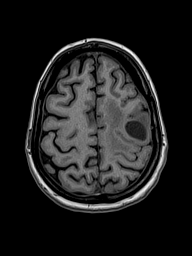
[im 142/160]
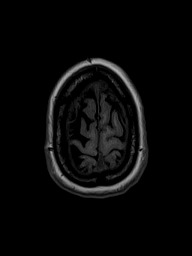
[im 160/160]
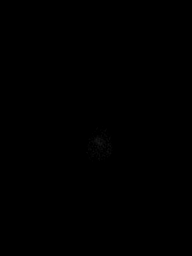

[Series 13: cor dwi_tracew · coronal · 5.0mm · 1.53mm/px · 3 of 58 slices shown]
[im 1/58]
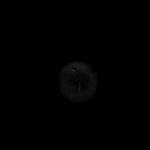
[im 29/58]
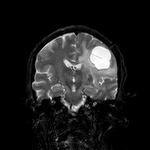
[im 58/58]
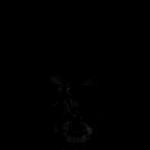

[Series 14: cor dwi_adc · coronal · 5.0mm · 1.53mm/px · 2 of 29 slices shown]
[im 1/29]
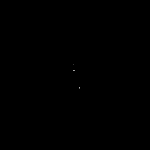
[im 29/29]
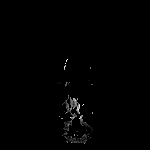

[Series 15: T2 post-contrast · coronal · 5.0mm · 0.57mm/px · 2 of 29 slices shown]
[im 1/29]
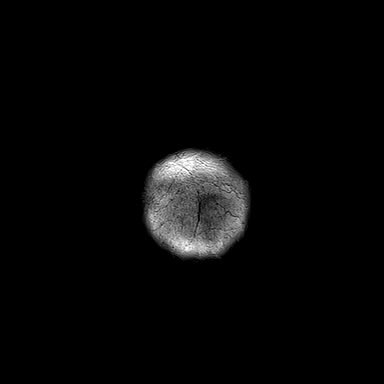
[im 29/29]
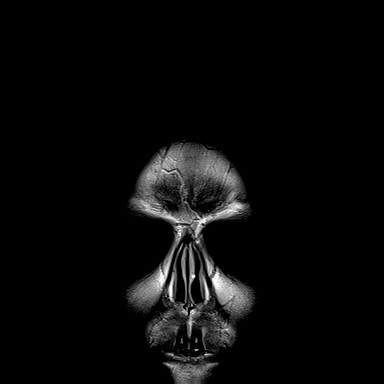

[Series 16: T1 post-contrast · axial · 1.0mm · 0.94mm/px · z∈[-68,+91]mm · 10 of 160 slices shown (1 of 3)]
[im 1/160]
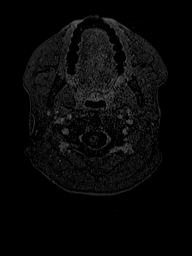
[im 18/160]
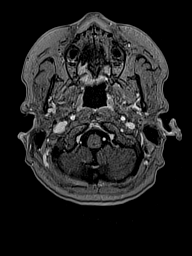
[im 36/160]
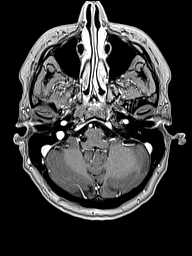
[im 54/160]
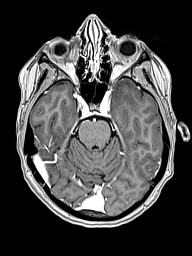
[im 71/160]
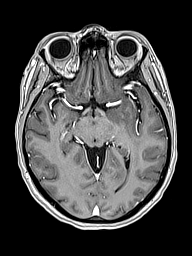
[im 89/160]
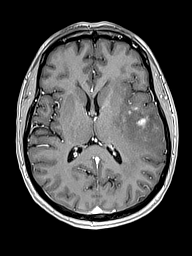
[im 107/160]
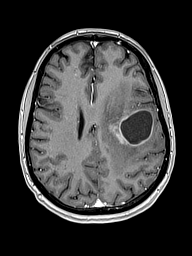
[im 124/160]
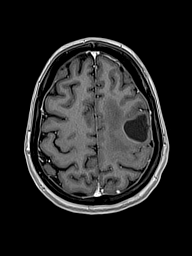
[im 142/160]
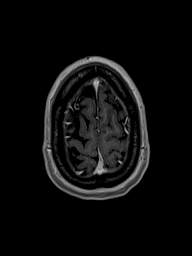
[im 160/160]
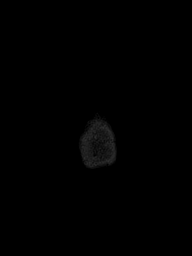

[Series 17: T1 post-contrast · coronal · 5.0mm · 0.43mm/px · 2 of 29 slices shown (2 of 3)]
[im 1/29]
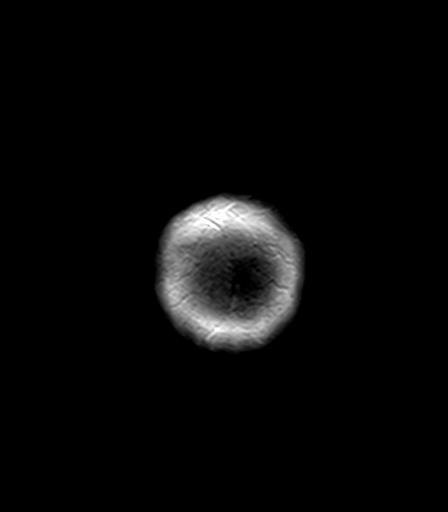
[im 29/29]
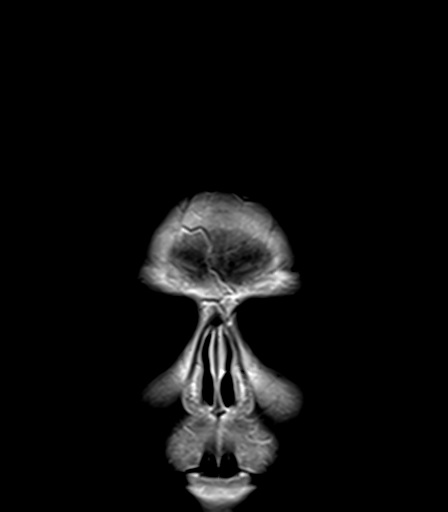

[Series 18: T1 post-contrast · sagittal · 5.0mm · 0.75mm/px · 2 of 26 slices shown (3 of 3)]
[im 1/26]
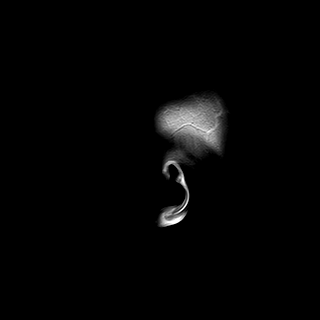
[im 26/26]
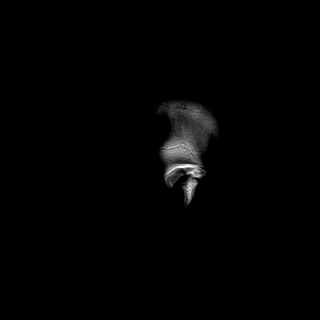

[48 of 48 positions shown; findings below may reference images not displayed]

FINDINGS: Brain: Unchanged infiltrative T2 hyperintensity spanning the
anteromedial left temporal lobe, insula, and lateral frontal lobe
reaching the subcortical space at the vertex. Unchanged ill-defined
enhancement around a smoothly lined cavity in the lateral frontal
lobe, especially inferior to the cyst. Local mass effect and slight
midline shift is non progressed. No new areas of enhancement. No
infarct, hemorrhage, or hydrocephalus

Vascular: Preserved flow voids and vascular enhancements.
Developmental venous anomaly in the right frontal lobe

Skull and upper cervical spine: Unremarkable

Sinuses/Orbits: Unremarkable
IMPRESSION: Compared to [DATE], stable mass related T2 signal, swelling, and
enhancement.

## 2021-03-14 MED ORDER — GADOBUTROL 1 MMOL/ML IV SOLN
6.0000 mL | Freq: Once | INTRAVENOUS | Status: AC | PRN
  Administered 2021-03-14: 6 mL via INTRAVENOUS

## 2021-03-18 ENCOUNTER — Inpatient Hospital Stay: Payer: BC Managed Care – PPO | Attending: Internal Medicine

## 2021-03-18 DIAGNOSIS — Z8249 Family history of ischemic heart disease and other diseases of the circulatory system: Secondary | ICD-10-CM | POA: Insufficient documentation

## 2021-03-18 DIAGNOSIS — R200 Anesthesia of skin: Secondary | ICD-10-CM | POA: Insufficient documentation

## 2021-03-18 DIAGNOSIS — Z79899 Other long term (current) drug therapy: Secondary | ICD-10-CM | POA: Insufficient documentation

## 2021-03-18 DIAGNOSIS — R4781 Slurred speech: Secondary | ICD-10-CM | POA: Insufficient documentation

## 2021-03-18 DIAGNOSIS — Z808 Family history of malignant neoplasm of other organs or systems: Secondary | ICD-10-CM | POA: Insufficient documentation

## 2021-03-18 DIAGNOSIS — C711 Malignant neoplasm of frontal lobe: Secondary | ICD-10-CM | POA: Insufficient documentation

## 2021-03-18 DIAGNOSIS — R55 Syncope and collapse: Secondary | ICD-10-CM | POA: Insufficient documentation

## 2021-03-18 DIAGNOSIS — Z880 Allergy status to penicillin: Secondary | ICD-10-CM | POA: Insufficient documentation

## 2021-03-18 DIAGNOSIS — Z8043 Family history of malignant neoplasm of testis: Secondary | ICD-10-CM | POA: Insufficient documentation

## 2021-03-18 DIAGNOSIS — R519 Headache, unspecified: Secondary | ICD-10-CM | POA: Insufficient documentation

## 2021-03-21 ENCOUNTER — Other Ambulatory Visit: Payer: Self-pay

## 2021-03-21 ENCOUNTER — Encounter: Payer: Self-pay | Admitting: Internal Medicine

## 2021-03-21 ENCOUNTER — Inpatient Hospital Stay (HOSPITAL_BASED_OUTPATIENT_CLINIC_OR_DEPARTMENT_OTHER): Payer: BC Managed Care – PPO | Admitting: Internal Medicine

## 2021-03-21 VITALS — BP 109/70 | HR 65 | Temp 98.1°F | Resp 16 | Ht 68.0 in | Wt 149.6 lb

## 2021-03-21 DIAGNOSIS — R4781 Slurred speech: Secondary | ICD-10-CM | POA: Diagnosis not present

## 2021-03-21 DIAGNOSIS — C711 Malignant neoplasm of frontal lobe: Secondary | ICD-10-CM

## 2021-03-21 DIAGNOSIS — R569 Unspecified convulsions: Secondary | ICD-10-CM

## 2021-03-21 DIAGNOSIS — Z880 Allergy status to penicillin: Secondary | ICD-10-CM | POA: Diagnosis not present

## 2021-03-21 DIAGNOSIS — Z8043 Family history of malignant neoplasm of testis: Secondary | ICD-10-CM | POA: Diagnosis not present

## 2021-03-21 DIAGNOSIS — Z79899 Other long term (current) drug therapy: Secondary | ICD-10-CM | POA: Diagnosis not present

## 2021-03-21 DIAGNOSIS — Z8249 Family history of ischemic heart disease and other diseases of the circulatory system: Secondary | ICD-10-CM | POA: Diagnosis not present

## 2021-03-21 DIAGNOSIS — Z808 Family history of malignant neoplasm of other organs or systems: Secondary | ICD-10-CM | POA: Diagnosis not present

## 2021-03-21 DIAGNOSIS — R519 Headache, unspecified: Secondary | ICD-10-CM | POA: Diagnosis not present

## 2021-03-21 DIAGNOSIS — R55 Syncope and collapse: Secondary | ICD-10-CM | POA: Diagnosis not present

## 2021-03-21 DIAGNOSIS — R200 Anesthesia of skin: Secondary | ICD-10-CM | POA: Diagnosis not present

## 2021-03-21 MED ORDER — HYDROCORTISONE 10 MG PO TABS: ORAL_TABLET | ORAL | 3 refills | Status: DC

## 2021-03-21 MED ORDER — DEXAMETHASONE 1 MG PO TABS: 1.0000 mg | ORAL_TABLET | Freq: Every day | ORAL | Status: DC

## 2021-03-21 MED ORDER — LORAZEPAM 2 MG PO TABS: 2.0000 mg | ORAL_TABLET | Freq: Three times a day (TID) | ORAL | 0 refills | Status: DC

## 2021-03-21 NOTE — Progress Notes (Signed)
[facility] [place], [place] [address] [phone]   Interval Evaluation  Date of Service: 03/21/21 Patient Name: Candace [name] Patient MRN: [id number] Patient DOB: 02-17-1981 Provider: Ventura Sellers, MD  Identifying Statement:  Candace [name] is a 40 y.o. female with left temporal  grade 3 astrocytoma     Oncologic History: Oncology History  Astrocytoma of frontal lobe ([facility])  12/08/2019 Surgery   Stereotactic R temporal biopsy by Dr. Marcello Moores.  Path demonstrates Astrocytoma, IDH-mt, WHO grade 3   01/17/2020 - 03/02/2020 Radiation Therapy   IMRT and concurrent Temodar with Dr. Isidore Moos     Biomarkers:  MGMT Unknown.  IDH 1/2 Mutated.  EGFR Unknown  TERT Unknown   Interval History:  Candace [name] presents today for follow up after recent MRI brain.  No significant changes with right sided weakness, clumsiness.  Her language impairments are also stable.  Decadron is currently at 58m BID, though any lower and she feels worse with regards to right hand and language expression.  Sleep has improved with medication changes.  Decadron 12/29/20: 452m11/05/22: 52m41mH+P (12/23/19) Patient presented to medical attention early in September with first ever seizure.  Event was described as "sudden onset left arm numbness" followed by "face twisting, and loss of consciousness for at least 30 minutes".  CNS imaging demonstrated a large left frontal non enhancing mass consistent with primary brain tumor.  She underwent biopsy on 12/08/19 with Dr. ThoMarcello MooresFollowing surgery she has some slurring of speech but no other significant complaints, returned to prior baseline upon discharge.  Now home, she has had no breathrough events and no focal neurologic complaints.    Medications: Current Outpatient Medications on File Prior to Visit  Medication Sig Dispense Refill   GENISTEIN PO Take 1 tablet by mouth at bedtime.     ibuprofen  (ADVIL) 200 MG tablet Take 600 mg by mouth every 6 (six) hours as needed for headache. (Patient not taking: No sig reported)     Lacosamide 150 MG TABS Take 1 tablet (150 mg total) by mouth 2 (two) times daily. 60 tablet 3   levETIRAcetam (KEPPRA) 1000 MG tablet TAKE 2 TABLETS(2000 MG) BY MOUTH TWICE DAILY 180 tablet 1   LYCOPENE PO Take 1 capsule by mouth daily.     MELATONIN PO Take 1 tablet by mouth at bedtime.     MILK THISTLE EXTRACT PO Take 1 capsule by mouth at bedtime. Silibinin     Omega-3 Fatty Acids (FISH OIL PO) Take 6 tablets by mouth daily.     OVER THE COUNTER MEDICATION Take 1 capsule by mouth daily. [place] mushroom     OVER THE COUNTER MEDICATION Take 1 tablet by mouth daily. Berberine     OVER THE COUNTER MEDICATION Take 1 capsule by mouth daily. Frankincense     RESVERATROL PO Take 1 capsule by mouth at bedtime.     TURMERIC PO Take 1 tablet by mouth daily.     No current facility-administered medications on file prior to visit.    Allergies:  Allergies  Allergen Reactions   Trazodone And Nefazodone Itching   Penicillins Rash   Past Medical History:  Past Medical History:  Diagnosis Date   [facility]  [facility]    Past Surgical History:  Past Surgical History:  Procedure Laterality Date   ABDOMINAL SURGERY     APPLICATION OF CRANIAL NAVIGATION Left 12/08/2019   Procedure:  APPLICATION OF CRANIAL NAVIGATION;  Surgeon: Vallarie Mare, MD;  Location: [facility];  Service: Neurosurgery;  Laterality: Left;  APPLICATION OF CRANIAL NAVIGATION   FRAMELESS  BIOPSY WITH BRAINLAB Left 12/08/2019   Procedure: Left stereotactic brain biopsy with brainlab;  Surgeon: Vallarie Mare, MD;  Location: [facility];  Service: Neurosurgery;  Laterality: Left;  Left stereotactic brain biopsy with brainlab   WISDOM TOOTH EXTRACTION     Social History:  Social History   Socioeconomic History   Marital status: Married    Spouse name: Not on file   Number of children: Not on file    Years of education: Not on file   Highest education level: Not on file  Occupational History   Not on file  Tobacco Use   Smoking status: Never   Smokeless tobacco: Never  Substance and Sexual Activity   Alcohol use: Not Currently   Drug use: Never   Sexual activity: Not on file  Other Topics Concern   Not on file  Social History Narrative   Not on file   Social Determinants of Health   Financial Resource Strain: Not on file  Food Insecurity: Not on file  Transportation Needs: Not on file  Physical Activity: Not on file  Stress: Not on file  Social Connections: Not on file  Intimate Partner Violence: Not on file   Family History:  Family History  Problem Relation Age of Onset   Migraines Mother    Thyroid cancer Father    Testicular cancer Father     Review of Systems: Constitutional: Doesn't report fevers, chills or abnormal weight loss Eyes: Doesn't report blurriness of vision Ears, nose, mouth, throat, and face: Doesn't report sore throat Respiratory: Doesn't report cough, dyspnea or wheezes Cardiovascular: Doesn't report palpitation, chest discomfort  Gastrointestinal:  Doesn't report nausea, constipation, diarrhea GU: Doesn't report incontinence Skin: Doesn't report skin rashes Neurological: Per HPI Musculoskeletal: Doesn't report joint pain Behavioral/Psych: +anxiety  Physical Exam: Wt Readings from Last 3 Encounters:  03/21/21 149 lb 9.6 oz (67.9 kg)  11/27/20 132 lb 14.4 oz (60.3 kg)  11/05/20 132 lb 8 oz (60.1 kg)   Temp Readings from Last 3 Encounters:  03/21/21 98.1 F (36.7 C) (Temporal)  11/27/20 97.8 F (36.6 C) (Oral)  11/05/20 98.9 F (37.2 C) (Oral)   BP Readings from Last 3 Encounters:  03/21/21 109/70  01/25/21 131/76  11/27/20 134/90   Pulse Readings from Last 3 Encounters:  03/21/21 65  01/25/21 (!) 55  11/27/20 73    KPS: 70. General: Alert, cooperative, pleasant, in no acute distress Head: Normal EENT: No  conjunctival injection or scleral icterus.  Lungs: Resp effort normal Cardiac: Regular rate Abdomen: Non-distended abdomen Skin: No rashes cyanosis or petechiae. Extremities: No clubbing or edema  Neurologic Exam: Mental Status: Awake, alert, attentive to examiner. Oriented to self and environment. Expressive dysphasia Cranial Nerves: Visual acuity is grossly normal. Visual fields are full. Extra-ocular movements intact. No ptosis. Face is symmetric Motor: Tone and bulk are normal. Right arm 5/5 with subtly impaired fine motor coordination, R leg 4+/5. Reflexes are symmetric, no pathologic reflexes present.  Sensory: Intact to light touch Gait: Hemiparetic   Labs: I have reviewed the data as listed    Component Value Date/Time   NA 136 10/29/2020 0158   K 3.9 10/29/2020 0158   CL 108 10/29/2020 0158   CO2 19 (L) 10/29/2020 0158   GLUCOSE 121 (H) 10/29/2020 0158   BUN 16 10/29/2020 0158  CREATININE 0.74 10/29/2020 0158   CREATININE 0.99 08/20/2020 1205   CALCIUM 8.8 (L) 10/29/2020 0158   PROT 6.7 10/28/2020 1128   ALBUMIN 3.8 10/28/2020 1128   AST 15 10/28/2020 1128   AST 14 (L) 08/20/2020 1205   ALT 14 10/28/2020 1128   ALT 16 08/20/2020 1205   ALKPHOS 41 10/28/2020 1128   BILITOT 0.7 10/28/2020 1128   BILITOT 0.6 08/20/2020 1205   GFRNONAA >60 10/29/2020 0158   GFRNONAA >60 08/20/2020 1205   GFRAA >60 12/09/2019 0623   Lab Results  Component Value Date   WBC 8.5 10/29/2020   NEUTROABS 5.3 10/28/2020   HGB 12.7 10/29/2020   HCT 34.9 (L) 10/29/2020   MCV 92.1 10/29/2020   PLT 173 10/29/2020   Imaging:  [facility] Clinician Interpretation: I have personally reviewed the CNS images as listed.  My interpretation, in the context of the patient's clinical presentation, is stable disease  MR BRAIN W WO CONTRAST  Result Date: 03/15/2021 CLINICAL DATA:  Brain tumor and headache.  Assess treatment response EXAM: MRI HEAD WITHOUT AND WITH CONTRAST TECHNIQUE: Multiplanar,  multiecho pulse sequences of the brain and surrounding structures were obtained without and with intravenous contrast. CONTRAST:  38m GADAVIST GADOBUTROL 1 MMOL/ML IV SOLN COMPARISON:  01/24/2021 FINDINGS: Brain: Unchanged infiltrative T2 hyperintensity spanning the anteromedial left temporal lobe, insula, and lateral frontal lobe reaching the subcortical space at the vertex. Unchanged ill-defined enhancement around a smoothly lined cavity in the lateral frontal lobe, especially inferior to the cyst. Local mass effect and slight midline shift is non progressed. No new areas of enhancement. No infarct, hemorrhage, or hydrocephalus Vascular: Preserved flow voids and vascular enhancements. Developmental venous anomaly in the right frontal lobe Skull and upper cervical spine: Unremarkable Sinuses/Orbits: Unremarkable IMPRESSION: Compared to 01/24/2021, stable mass related T2 signal, swelling, and enhancement. Electronically Signed   By: JJorje GuildM.D.   On: 03/15/2021 05:54     Assessment/Plan Astrocytoma of frontal lobe ([facility] [C71.1]  JJoesphine Schemmis clinically stable today.  MRI brain demonstrates stable findings when compared to October study.  Back further, main change is increase in volume of cyst, but this has also stabilized over past 2 scans.  This constellation of findings is suggestive of stability from an oncologic standpoint.   We did still recommend adjuvant chemotherapy which she continues to decline.  We recommended continuing Keppra 20072mBID, Vimpat 15062mID for now.  Will attempt corticosteroid bridge with Hydrocortisone.  Can start 81m28m AM, 10mg45mPM, then remove evening dose of decadron.   We ask that JessiLoda Bialasrn to clinic in 4 weeks to continue steroid taper.  Next MRI study in 3 months. She is agreeable with this plan.  All questions were answered. The patient knows to call the clinic with any problems, questions or concerns. No barriers to learning  were detected.  The total time spent in the encounter was 40 minutes and more than 50% was on counseling and review of test results   ZachaVentura SellersMedical Director of Neuro-Oncology [facility]2/22 10:20 AM

## 2021-03-22 ENCOUNTER — Encounter: Payer: Self-pay | Admitting: Internal Medicine

## 2021-03-22 ENCOUNTER — Telehealth: Payer: Self-pay | Admitting: Internal Medicine

## 2021-03-22 NOTE — Telephone Encounter (Signed)
Scheduled per 12/22 los, pt has been called and confirmed  °

## 2021-03-29 ENCOUNTER — Encounter: Payer: Self-pay | Admitting: Internal Medicine

## 2021-04-02 ENCOUNTER — Encounter: Payer: Self-pay | Admitting: Internal Medicine

## 2021-04-18 ENCOUNTER — Inpatient Hospital Stay: Payer: BC Managed Care – PPO | Attending: Internal Medicine | Admitting: Internal Medicine

## 2021-04-18 DIAGNOSIS — C711 Malignant neoplasm of frontal lobe: Secondary | ICD-10-CM

## 2021-04-18 MED ORDER — LACOSAMIDE 150 MG PO TABS: 150.0000 mg | ORAL_TABLET | Freq: Two times a day (BID) | ORAL | 3 refills | Status: DC

## 2021-04-18 MED ORDER — HYDROCORTISONE 10 MG PO TABS: ORAL_TABLET | ORAL | 3 refills | Status: DC
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
## 2021-04-19 ENCOUNTER — Telehealth: Payer: Self-pay | Admitting: Internal Medicine

## 2021-04-19 NOTE — Telephone Encounter (Signed)
Scheduled per 1/19 los, pt has been called and confirmed

## 2021-04-25 ENCOUNTER — Encounter: Payer: Self-pay | Admitting: Internal Medicine

## 2021-04-25 ENCOUNTER — Other Ambulatory Visit: Payer: Self-pay | Admitting: *Deleted

## 2021-04-25 DIAGNOSIS — C711 Malignant neoplasm of frontal lobe: Secondary | ICD-10-CM

## 2021-05-27 ENCOUNTER — Other Ambulatory Visit: Payer: Self-pay | Admitting: Radiation Therapy

## 2021-06-05 ENCOUNTER — Encounter: Payer: Self-pay | Admitting: Internal Medicine

## 2021-06-10 ENCOUNTER — Encounter: Payer: Self-pay | Admitting: Internal Medicine

## 2021-06-14 ENCOUNTER — Ambulatory Visit (HOSPITAL_COMMUNITY)
Admission: RE | Admit: 2021-06-14 | Discharge: 2021-06-14 | Disposition: A | Discharge status: Home / Self Care | Payer: BC Managed Care – PPO | Source: Ambulatory Visit | Attending: Internal Medicine | Admitting: Internal Medicine

## 2021-06-14 ENCOUNTER — Other Ambulatory Visit: Payer: Self-pay

## 2021-06-14 DIAGNOSIS — C711 Malignant neoplasm of frontal lobe: Secondary | ICD-10-CM | POA: Insufficient documentation

## 2021-06-14 IMAGING — MR MR HEAD WO/W CM
13 series · 48 of 48 positions shown · IV contrast (gadavist)
Comparison: [DATE] and earlier.

CLINICAL DATA: 41-year-old female with frontal lobe astrocytoma IDH
mutant, WHO grade 3. Restaging.

EXAM:
MRI HEAD WITHOUT AND WITH CONTRAST
TECHNIQUE: Multiplanar, multiecho pulse sequences of the brain and surrounding
structures were obtained without and with intravenous contrast.
CONTRAST:  7mL GADAVIST GADOBUTROL 1 MMOL/ML IV SOLN

[Series 5: DWI · axial · 3.0mm · 1.36mm/px · z∈[-81,+71]mm · 6 of 104 slices shown (1 of 2)]
[im 1/104]
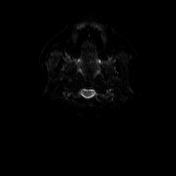
[im 21/104]
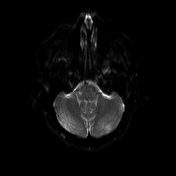
[im 42/104]
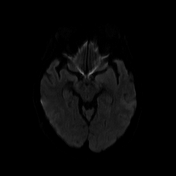
[im 62/104]
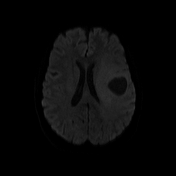
[im 83/104]
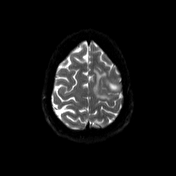
[im 104/104]
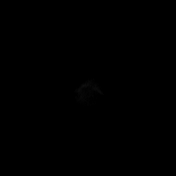

[Series 6: DWI · axial · 3.0mm · 1.36mm/px · z∈[-81,+71]mm · 3 of 52 slices shown (2 of 2)]
[im 1/52]
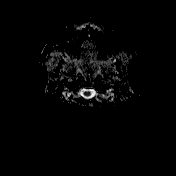
[im 26/52]
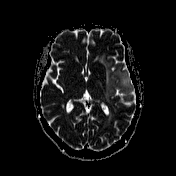
[im 52/52]
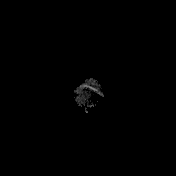

[Series 7: T1 · sagittal · 5.0mm · 0.75mm/px · 1 of 24 slices shown (1 of 2)]
[im 1/24]
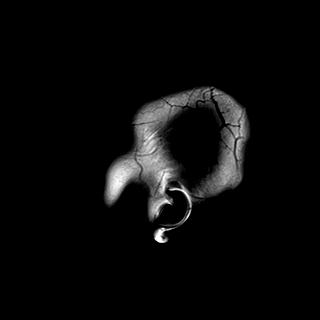

[Series 8: T2 · axial · 5.0mm · 0.62mm/px · z∈[-88,+74]mm · 2 of 26 slices shown]
[im 1/26]
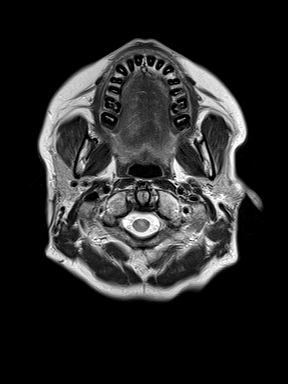
[im 26/26]
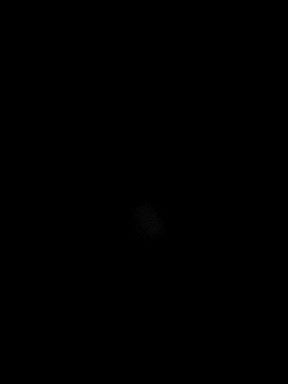

[Series 9: swi_images · axial · 3.0mm · 0.75mm/px · z∈[-89,+76]mm · 3 of 56 slices shown]
[im 1/56]
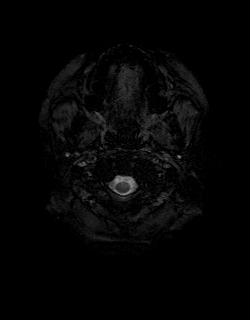
[im 28/56]
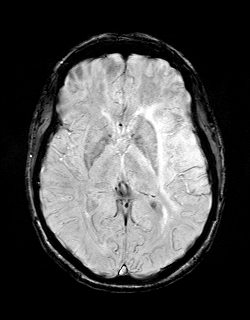
[im 56/56]
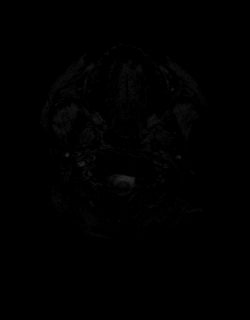

[Series 11: FLAIR · axial · 3.0mm · 0.75mm/px · z∈[-83,+70]mm · 3 of 52 slices shown]
[im 1/52]
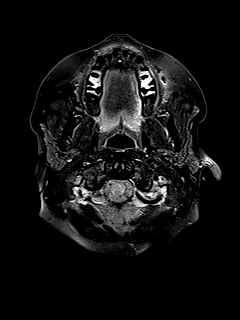
[im 26/52]
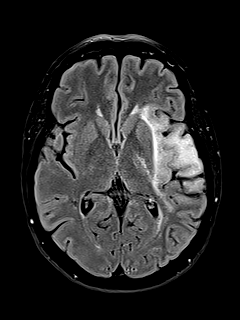
[im 52/52]
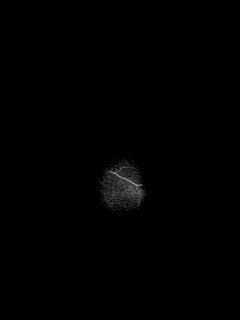

[Series 12: T1 · axial · 1.0mm · 0.94mm/px · z∈[-86,+73]mm · 10 of 160 slices shown (2 of 2)]
[im 1/160]
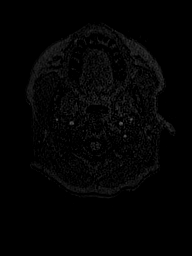
[im 18/160]
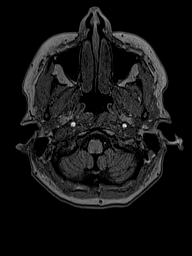
[im 36/160]
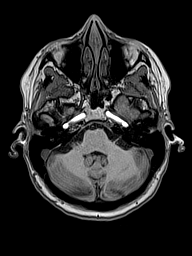
[im 54/160]
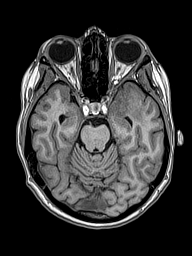
[im 71/160]
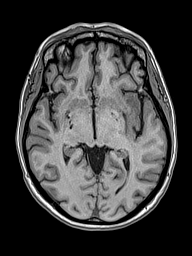
[im 89/160]
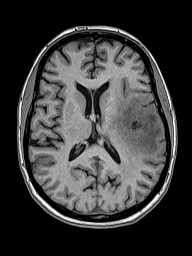
[im 107/160]
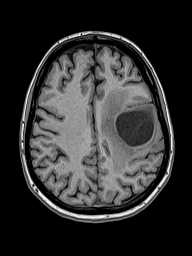
[im 124/160]
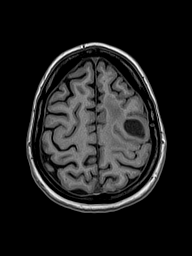
[im 142/160]
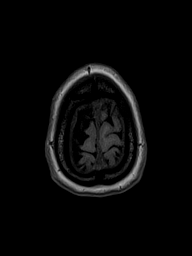
[im 160/160]
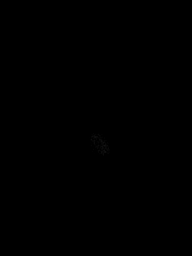

[Series 13: cor dwi_tracew · coronal · 5.0mm · 1.53mm/px · 3 of 56 slices shown]
[im 1/56]
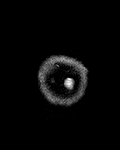
[im 28/56]
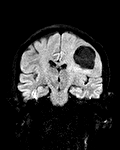
[im 56/56]
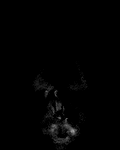

[Series 14: cor dwi_adc · coronal · 5.0mm · 1.53mm/px · 2 of 28 slices shown]
[im 1/28]
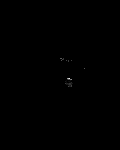
[im 28/28]
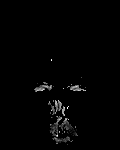

[Series 15: T2 post-contrast · coronal · 5.0mm · 0.57mm/px · 2 of 29 slices shown]
[im 1/29]
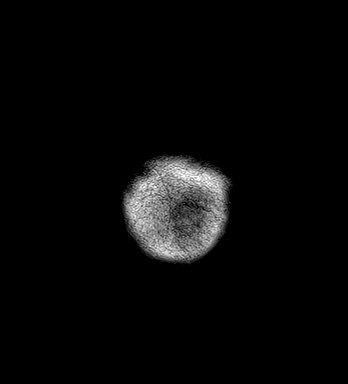
[im 29/29]
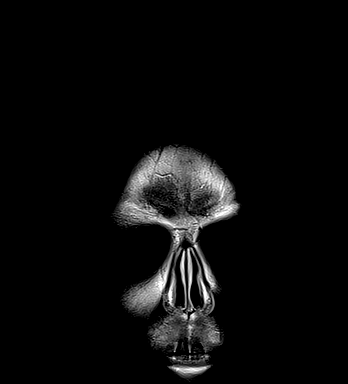

[Series 16: T1 post-contrast · axial · 1.0mm · 0.94mm/px · z∈[-86,+73]mm · 10 of 160 slices shown (1 of 3)]
[im 1/160]
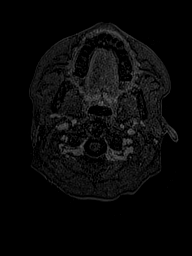
[im 18/160]
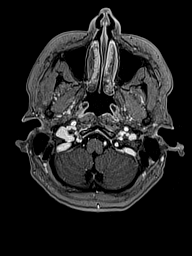
[im 36/160]
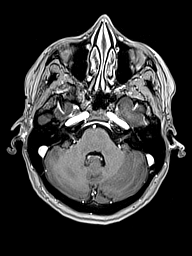
[im 54/160]
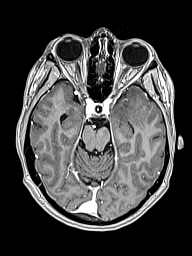
[im 71/160]
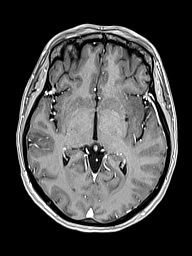
[im 89/160]
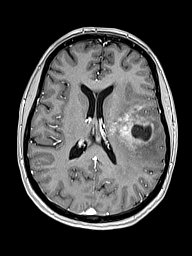
[im 107/160]
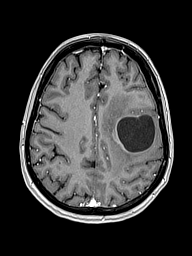
[im 124/160]
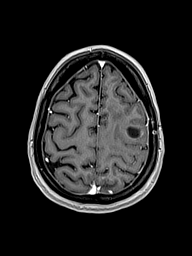
[im 142/160]
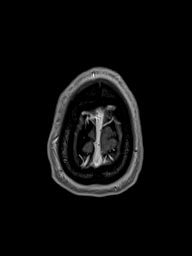
[im 160/160]
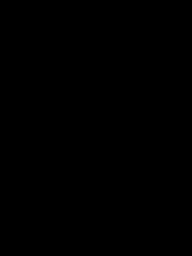

[Series 17: T1 post-contrast · coronal · 5.0mm · 0.43mm/px · 2 of 29 slices shown (2 of 3)]
[im 1/29]
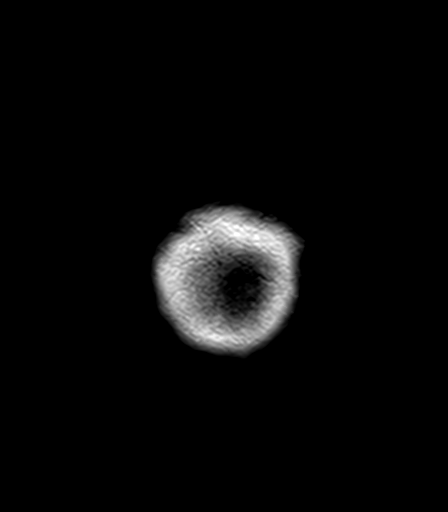
[im 29/29]
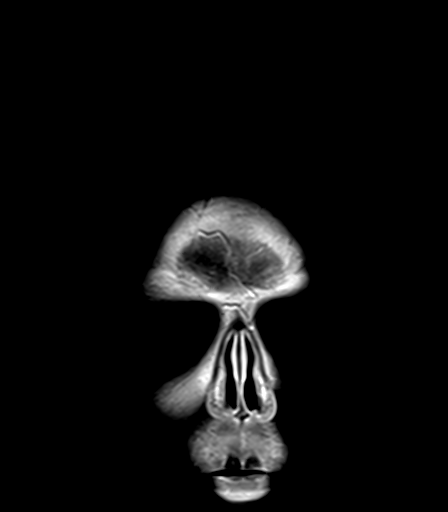

[Series 18: T1 post-contrast · sagittal · 5.0mm · 0.75mm/px · 1 of 24 slices shown (3 of 3)]
[im 1/24]
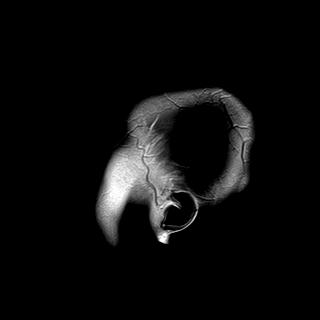

[48 of 48 positions shown; findings below may reference images not displayed]

FINDINGS: Brain: Combined cystic and solid tumor epicenter in the left
posterior frontal gyrus. Cystic component has enlarged and roughly
doubled from last year. The cystic component is more stable from
[place] although up to 2 mm larger in the cc dimension.

Nodular and petechial enhancement along the medial and inferior
margin of the tumoral cyst is stable since [REDACTED], such that the
combined enhancing and cystic components of the mass in compass 48 x
46 by 48 mm (AP by transverse by CC). Larger area of T2 and FLAIR
hyperintensity in the left hemisphere including infiltration and
expansion of the left insula and superior temporal lobe appears
stable from last year. Mild chronic mass effect on the left lateral
ventricle. Trace rightward midline shift is chronic.

Mild T2 hyperintensity tracking to the midline of the body of the
corpus callosum appears unchanged from last year. And there is no
contralateral right hemisphere signal abnormality or enhancement.

Anterior right frontal lobe developmental venous anomaly (normal
variant) with mild associated hemosiderin. No restricted diffusion
suggestive of acute infarction. No ventriculomegaly or acute
intracranial hemorrhage. Cervicomedullary junction and pituitary are
within normal limits.

Vascular: Major intracranial vascular flow voids are preserved. The
major dural venous sinuses are enhancing and appear to be patent.

Skull and upper cervical spine: Negative.

Sinuses/Orbits: Negative.

Other: Visible internal auditory structures appear normal.
IMPRESSION: 1. Slowly enlarging cystic component of the infiltrative left
frontal lobe mass, roughly doubled since [REDACTED]. But the
surrounding multilobar T2/FLAIR hyperintensity is stable over the
same time period. Irregular enhancement along the medial and
inferior margins of the cyst are stable since [REDACTED]. Combined
cystic and enhancing components measure up to 4.8 cm long axis.

2. No extension into the right hemisphere or new intracranial
abnormality.

## 2021-06-14 MED ORDER — GADOBUTROL 1 MMOL/ML IV SOLN
7.0000 mL | Freq: Once | INTRAVENOUS | Status: AC | PRN
  Administered 2021-06-14: 7 mL via INTRAVENOUS

## 2021-06-17 ENCOUNTER — Inpatient Hospital Stay: Payer: BC Managed Care – PPO

## 2021-06-17 ENCOUNTER — Encounter: Payer: Self-pay | Admitting: Internal Medicine

## 2021-06-17 ENCOUNTER — Inpatient Hospital Stay: Payer: BC Managed Care – PPO | Attending: Internal Medicine | Admitting: Internal Medicine

## 2021-06-17 ENCOUNTER — Other Ambulatory Visit: Payer: Self-pay

## 2021-06-17 VITALS — BP 120/79 | HR 78 | Temp 97.8°F | Resp 16 | Wt 144.1 lb

## 2021-06-17 DIAGNOSIS — Z808 Family history of malignant neoplasm of other organs or systems: Secondary | ICD-10-CM | POA: Diagnosis not present

## 2021-06-17 DIAGNOSIS — R55 Syncope and collapse: Secondary | ICD-10-CM | POA: Insufficient documentation

## 2021-06-17 DIAGNOSIS — R4781 Slurred speech: Secondary | ICD-10-CM | POA: Insufficient documentation

## 2021-06-17 DIAGNOSIS — R569 Unspecified convulsions: Secondary | ICD-10-CM | POA: Diagnosis not present

## 2021-06-17 DIAGNOSIS — R233 Spontaneous ecchymoses: Secondary | ICD-10-CM | POA: Diagnosis not present

## 2021-06-17 DIAGNOSIS — Z880 Allergy status to penicillin: Secondary | ICD-10-CM | POA: Insufficient documentation

## 2021-06-17 DIAGNOSIS — Z8043 Family history of malignant neoplasm of testis: Secondary | ICD-10-CM | POA: Insufficient documentation

## 2021-06-17 DIAGNOSIS — R200 Anesthesia of skin: Secondary | ICD-10-CM | POA: Diagnosis not present

## 2021-06-17 DIAGNOSIS — Z8249 Family history of ischemic heart disease and other diseases of the circulatory system: Secondary | ICD-10-CM | POA: Diagnosis not present

## 2021-06-17 DIAGNOSIS — Z79899 Other long term (current) drug therapy: Secondary | ICD-10-CM | POA: Diagnosis not present

## 2021-06-17 DIAGNOSIS — C711 Malignant neoplasm of frontal lobe: Secondary | ICD-10-CM | POA: Diagnosis not present

## 2021-06-17 NOTE — Progress Notes (Signed)
? ?[facility] [address]  ?[place], [place] [address] ?[phone] ? ? ?Interval Evaluation ? ?Date of Service: 06/17/21 ?Patient Name: Candace [name] ?Patient MRN: [id number] ?Patient DOB: 04-Aug-1980 ?Provider: Ventura Sellers, MD ? ?Identifying Statement:  ?Candace [name] is a 41 y.o. female with left temporal  grade 3 astrocytoma    ? ?Oncologic History: ?Oncology History  ?Astrocytoma of frontal lobe ([place])  ?12/08/2019 Surgery  ? Stereotactic R temporal biopsy by Dr. Marcello Moores.  Path demonstrates Astrocytoma, IDH-mt, WHO grade 3 ?  ?01/17/2020 - 03/02/2020 Radiation Therapy  ? IMRT and concurrent Temodar with Dr. Isidore Moos ?  ? ? ?Biomarkers: ? ?MGMT Unknown.  ?IDH 1/2 Mutated.  ?EGFR Unknown  ?TERT Unknown  ? ?Interval History: ? Candace [name] presents today for follow up after recent MRI brain.  No major changes with right hand clumsiness, gait.  Her language impairments are also more or less stable, though clearly worse than 1 year prior.  Seizure frequency is somewhat increased, currently experiencing an event every 3-4 weeks, still on the keppra and vimpat doses.  Hydrocortisone is at 29m BID.  Sleep has improved with medication changes. ? ?Decadron ?12/29/20: 446m?02/02/21: 34m46m ?H+P (12/23/19) Patient presented to medical attention early in September with first ever seizure.  Event was described as "sudden onset left arm numbness" followed by "face twisting, and loss of consciousness for at least 30 minutes".  CNS imaging demonstrated a large left frontal non enhancing mass consistent with primary brain tumor.  She underwent biopsy on 12/08/19 with Dr. ThoMarcello MooresFollowing surgery she has some slurring of speech but no other significant complaints, returned to prior baseline upon discharge.  Now home, she has had no breathrough events and no focal neurologic complaints.   ? ?Medications: ?Current Outpatient Medications on File Prior to Visit  ?Medication Sig Dispense  Refill  ? dexamethasone (DECADRON) 1 MG tablet Take 1 tablet (1 mg total) by mouth daily.    ? GENISTEIN PO Take 1 tablet by mouth at bedtime.    ? hydrocortisone (CORTEF) 10 MG tablet Take 1 tablet (10 mg total) by mouth in the morning AND 1 tablet (10 mg total) at bedtime. 90 tablet 3  ? ibuprofen (ADVIL) 200 MG tablet Take 600 mg by mouth every 6 (six) hours as needed for headache. (Patient not taking: No sig reported)    ? Lacosamide 150 MG TABS Take 1 tablet (150 mg total) by mouth 2 (two) times daily. 60 tablet 3  ? levETIRAcetam (KEPPRA) 1000 MG tablet TAKE 2 TABLETS(2000 MG) BY MOUTH TWICE DAILY 180 tablet 1  ? LORazepam (ATIVAN) 2 MG tablet Take 1 tablet (2 mg total) by mouth every 8 (eight) hours. 30 tablet 0  ? LYCOPENE PO Take 1 capsule by mouth daily.    ? MELATONIN PO Take 1 tablet by mouth at bedtime.    ? MILK THISTLE EXTRACT PO Take 1 capsule by mouth at bedtime. Silibinin    ? Omega-3 Fatty Acids (FISH OIL PO) Take 6 tablets by mouth daily.    ? OVER THE COUNTER MEDICATION Take 1 capsule by mouth daily. Maitake mushroom    ? OVER THE COUNTER MEDICATION Take 1 tablet by mouth daily. Berberine    ? OVER THE COUNTER MEDICATION Take 1 capsule by mouth daily. Frankincense    ? RESVERATROL PO Take 1 capsule by mouth at bedtime.    ? TURMERIC PO Take 1 tablet by mouth daily.    ? ?  No current facility-administered medications on file prior to visit.  ? ? ?Allergies:  ?Allergies  ?Allergen Reactions  ? Trazodone And Nefazodone Itching  ? Penicillins Rash  ? ?Past Medical History:  ?Past Medical History:  ?Diagnosis Date  ? Brain tumor ([place])   ? Headache   ? ?Past Surgical History:  ?Past Surgical History:  ?Procedure Laterality Date  ? ABDOMINAL SURGERY    ? APPLICATION OF CRANIAL NAVIGATION Left 12/08/2019  ? Procedure: APPLICATION OF CRANIAL NAVIGATION;  Surgeon: Vallarie Mare, MD;  Location: [facility];  Service: Neurosurgery;  Laterality: Left;  APPLICATION OF CRANIAL NAVIGATION  ? FRAMELESS  BIOPSY WITH  BRAINLAB Left 12/08/2019  ? Procedure: Left stereotactic brain biopsy with brainlab;  Surgeon: Vallarie Mare, MD;  Location: [facility];  Service: Neurosurgery;  Laterality: Left;  Left stereotactic brain biopsy with brainlab  ? WISDOM TOOTH EXTRACTION    ? ?Social History:  ?Social History  ? ?Socioeconomic History  ? Marital status: Married  ?  Spouse name: Not on file  ? Number of children: Not on file  ? Years of education: Not on file  ? Highest education level: Not on file  ?Occupational History  ? Not on file  ?Tobacco Use  ? Smoking status: Never  ? Smokeless tobacco: Never  ?Substance and Sexual Activity  ? Alcohol use: Not Currently  ? Drug use: Never  ? Sexual activity: Not on file  ?Other Topics Concern  ? Not on file  ?Social History Narrative  ? Not on file  ? ?Social Determinants of Health  ? ?Financial Resource Strain: Not on file  ?Food Insecurity: Not on file  ?Transportation Needs: Not on file  ?Physical Activity: Not on file  ?Stress: Not on file  ?Social Connections: Not on file  ?Intimate Partner Violence: Not on file  ? ?Family History:  ?Family History  ?Problem Relation Age of Onset  ? Migraines Mother   ? Thyroid cancer Father   ? Testicular cancer Father   ? ? ?Review of Systems: ?Constitutional: Doesn't report fevers, chills or abnormal weight loss ?Eyes: Doesn't report blurriness of vision ?Ears, nose, mouth, throat, and face: Doesn't report sore throat ?Respiratory: Doesn't report cough, dyspnea or wheezes ?Cardiovascular: Doesn't report palpitation, chest discomfort  ?Gastrointestinal:  Doesn't report nausea, constipation, diarrhea ?GU: Doesn't report incontinence ?Skin: Doesn't report skin rashes ?Neurological: Per HPI ?Musculoskeletal: Doesn't report joint pain ?Behavioral/Psych: +anxiety ? ?Physical Exam: ?Wt Readings from Last 3 Encounters:  ?03/21/21 149 lb 9.6 oz (67.9 kg)  ?11/27/20 132 lb 14.4 oz (60.3 kg)  ?11/05/20 132 lb 8 oz (60.1 kg)  ? ?Temp Readings from Last 3 Encounters:   ?03/21/21 98.1 ?F (36.7 ?C) (Temporal)  ?11/27/20 97.8 ?F (36.6 ?C) (Oral)  ?11/05/20 98.9 ?F (37.2 ?C) (Oral)  ? ?BP Readings from Last 3 Encounters:  ?03/21/21 109/70  ?01/25/21 131/76  ?11/27/20 134/90  ? ?Pulse Readings from Last 3 Encounters:  ?03/21/21 65  ?01/25/21 (!) 55  ?11/27/20 73  ? ? ?KPS: 70. ?General: Alert, cooperative, pleasant, in no acute distress ?Head: Normal ?EENT: No conjunctival injection or scleral icterus.  ?Lungs: Resp effort normal ?Cardiac: Regular rate ?Abdomen: Non-distended abdomen ?Skin: No rashes cyanosis or petechiae. ?Extremities: No clubbing or edema ? ?Neurologic Exam: ?Mental Status: Awake, alert, attentive to examiner. Oriented to self and environment. Expressive dysphasia ?Cranial Nerves: Visual acuity is grossly normal. Visual fields are full. Extra-ocular movements intact. No ptosis. Face is symmetric ?Motor: Tone and bulk are normal. Right  arm 5/5 with subtly impaired fine motor coordination, R leg 4+/5. Reflexes are symmetric, no pathologic reflexes present.  ?Sensory: Intact to light touch ?Gait: Hemiparetic ? ? ?Labs: ?I have reviewed the data as listed ?   ?Component Value Date/Time  ? NA 136 10/29/2020 0158  ? K 3.9 10/29/2020 0158  ? CL 108 10/29/2020 0158  ? CO2 19 (L) 10/29/2020 0158  ? GLUCOSE 121 (H) 10/29/2020 0158  ? BUN 16 10/29/2020 0158  ? CREATININE 0.74 10/29/2020 0158  ? CREATININE 0.99 08/20/2020 1205  ? CALCIUM 8.8 (L) 10/29/2020 0158  ? PROT 6.7 10/28/2020 1128  ? ALBUMIN 3.8 10/28/2020 1128  ? AST 15 10/28/2020 1128  ? AST 14 (L) 08/20/2020 1205  ? ALT 14 10/28/2020 1128  ? ALT 16 08/20/2020 1205  ? ALKPHOS 41 10/28/2020 1128  ? BILITOT 0.7 10/28/2020 1128  ? BILITOT 0.6 08/20/2020 1205  ? GFRNONAA >60 10/29/2020 0158  ? GFRNONAA >60 08/20/2020 1205  ? GFRAA >60 12/09/2019 3154  ? ?Lab Results  ?Component Value Date  ? WBC 8.5 10/29/2020  ? NEUTROABS 5.3 10/28/2020  ? HGB 12.7 10/29/2020  ? HCT 34.9 (L) 10/29/2020  ? MCV 92.1 10/29/2020  ? PLT 173  10/29/2020  ? ?Imaging: ? ?[place] [facility] Clinician Interpretation: I have personally reviewed the CNS images as listed.  My interpretation, in the context of the patient's clinical presentation, is  continued

## 2021-06-28 ENCOUNTER — Other Ambulatory Visit: Payer: Self-pay | Admitting: Neurosurgery

## 2021-06-30 ENCOUNTER — Encounter (HOSPITAL_COMMUNITY): Payer: Self-pay

## 2021-06-30 ENCOUNTER — Ambulatory Visit (HOSPITAL_COMMUNITY)
Admission: RE | Admit: 2021-06-30 | Discharge: 2021-06-30 | Disposition: A | Discharge status: Home / Self Care | Payer: BC Managed Care – PPO | Source: Ambulatory Visit | Attending: Family Medicine | Admitting: Family Medicine

## 2021-06-30 VITALS — BP 125/80 | HR 81 | Temp 98.1°F | Resp 17

## 2021-06-30 DIAGNOSIS — R569 Unspecified convulsions: Secondary | ICD-10-CM | POA: Diagnosis not present

## 2021-06-30 DIAGNOSIS — Z760 Encounter for issue of repeat prescription: Secondary | ICD-10-CM

## 2021-06-30 DIAGNOSIS — C719 Malignant neoplasm of brain, unspecified: Secondary | ICD-10-CM | POA: Diagnosis not present

## 2021-06-30 MED ORDER — LACOSAMIDE 150 MG PO TABS: 150.0000 mg | ORAL_TABLET | Freq: Two times a day (BID) | ORAL | 3 refills | Status: DC

## 2021-06-30 MED ORDER — LACOSAMIDE 150 MG PO TABS: 150.0000 mg | ORAL_TABLET | Freq: Two times a day (BID) | ORAL | 0 refills | Status: DC

## 2021-06-30 NOTE — Discharge Instructions (Addendum)
You were seen today for refill of medication.  ?I have printed this out for you to take to the pharmacy.  ?Please follow up with your neurologist as you have planned.  ?

## 2021-06-30 NOTE — ED Provider Notes (Signed)
?[facility] ? ? ? ?CSN: [id number] ?Arrival date & time: 06/30/21  1438 ? ? ?  ? ?History   ?Chief Complaint ?Chief Complaint  ?Patient presents with  ? Follow-up  ?  medication refill - Entered by patient  ? Medication Refill  ? ? ?HPI ?Candace [name] is a 41 y.o. female.  ? ?Patient is here for refill of seizure medications.  ?She has a cyst within a brain tumor that is growing rapidly.  She is scheduled to have this drained by neuro this week.  ?As a result she has had more increased seizures.  She did  have a seizure this morning, but feeling okay at this time.  ?She took her last dose of locasamide today and needs enough to get through this week. She has enough keppra.  ? ? ?Past Medical History:  ?Diagnosis Date  ? Brain tumor ([place])   ? Headache   ? ? ?Patient Active Problem List  ? Diagnosis Date Noted  ? Seizure ([place]) 10/28/2020  ? Primary malignant neoplasm of frontal lobe ([facility]) 01/13/2020  ? Focal seizure ([place]) 12/07/2019  ? Astrocytoma of frontal lobe ([place]) 12/07/2019  ? ? ?Past Surgical History:  ?Procedure Laterality Date  ? ABDOMINAL SURGERY    ? APPLICATION OF CRANIAL NAVIGATION Left 12/08/2019  ? Procedure: APPLICATION OF CRANIAL NAVIGATION;  Surgeon: Vallarie Mare, MD;  Location: [facility];  Service: Neurosurgery;  Laterality: Left;  APPLICATION OF CRANIAL NAVIGATION  ? FRAMELESS  BIOPSY WITH BRAINLAB Left 12/08/2019  ? Procedure: Left stereotactic brain biopsy with brainlab;  Surgeon: Vallarie Mare, MD;  Location: [facility];  Service: Neurosurgery;  Laterality: Left;  Left stereotactic brain biopsy with brainlab  ? WISDOM TOOTH EXTRACTION    ? ? ?OB History   ?No obstetric history on file. ?  ? ? ? ?Home Medications   ? ?Prior to Admission medications   ?Medication Sig Start Date End Date Taking? Authorizing Provider  ?hydrocortisone (CORTEF) 10 MG tablet Take 1 tablet (10 mg total) by mouth in the morning AND 1 tablet (10 mg total) at bedtime. ?Patient not taking: Reported on 06/28/2021  04/18/21   Ventura Sellers, MD  ?ibuprofen (ADVIL) 200 MG tablet Take 400 mg by mouth every 8 (eight) hours as needed for headache.    [provider]  ?Lacosamide 150 MG TABS Take 1 tablet (150 mg total) by mouth 2 (two) times daily. 04/18/21   Ventura Sellers, MD  ?levETIRAcetam (KEPPRA) 1000 MG tablet TAKE 2 TABLETS(2000 MG) BY MOUTH TWICE DAILY 03/04/21   Vaslow, Acey Lav, MD  ?LORazepam (ATIVAN) 2 MG tablet Take 1 tablet (2 mg total) by mouth every 8 (eight) hours. ?Patient not taking: Reported on 06/28/2021 03/21/21   Ventura Sellers, MD  ?LYCOPENE PO Take 1 capsule by mouth daily.    [provider]  ?magnesium gluconate (MAGONATE) 500 MG tablet Take 250 mg by mouth daily.    [provider]  ?MELATONIN PO Take 1 tablet by mouth at bedtime.    [provider]  ?Omega-3 Fatty Acids (FISH OIL PO) Take 2 capsules by mouth in the morning, at noon, and at bedtime.    [provider]  ?OVER THE COUNTER MEDICATION Take 1 capsule by mouth daily. Maitake mushroom    [provider]  ?OVER THE COUNTER MEDICATION Take 1 tablet by mouth daily. Berberine    [provider]  ?OVER THE COUNTER MEDICATION Take 1 capsule by mouth daily.  Frankincense    [provider]  ?OVER THE COUNTER MEDICATION Take 1 tablet by mouth daily. Vit C Zinc Vit D    [provider]  ?RESVERATROL PO Take 1 capsule by mouth at bedtime.    [provider]  ?TURMERIC PO Take 1 tablet by mouth daily.    [provider]  ? ? ?Family History ?Family History  ?Problem Relation Age of Onset  ? Migraines Mother   ? Thyroid cancer Father   ? Testicular cancer Father   ? ? ?Social History ?Social History  ? ?Tobacco Use  ? Smoking status: Never  ? Smokeless tobacco: Never  ?Substance Use Topics  ? Alcohol use: Not Currently  ? Drug use: Never  ? ? ? ?Allergies   ?Trazodone and nefazodone, Amitriptyline, and Penicillins ? ? ?Review of Systems ?Review of  Systems  ?Constitutional: Negative.   ?HENT: Negative.    ?Respiratory: Negative.    ?Cardiovascular: Negative.   ?Gastrointestinal: Negative.   ?Neurological:  Positive for seizures and weakness.  ?Psychiatric/Behavioral: Negative.    ? ? ?Physical Exam ?Triage Vital Signs ?ED Triage Vitals [06/30/21 1445]  ?Enc Vitals Group  ?   BP 125/80  ?   Pulse Rate 81  ?   Resp 17  ?   Temp 98.1 ?F (36.7 ?C)  ?   Temp Source Oral  ?   SpO2 100 %  ?   Weight   ?   Height   ?   Head Circumference   ?   Peak Flow   ?   Pain Score 0  ?   Pain Loc   ?   Pain Edu?   ?   Excl. in [place]?   ? ?No data found. ? ?Updated Vital Signs ?BP 125/80 (BP Location: Left Arm)   Pulse 81   Temp 98.1 ?F (36.7 ?C) (Oral)   Resp 17   LMP  (LMP Unknown)   SpO2 100%  ? ?Visual Acuity ?Right Eye Distance:   ?Left Eye Distance:   ?Bilateral Distance:   ? ?Right Eye Near:   ?Left Eye Near:    ?Bilateral Near:    ? ?Physical Exam ?Constitutional:   ?   Appearance: Normal appearance.  ?Cardiovascular:  ?   Rate and Rhythm: Normal rate and regular rhythm.  ?Pulmonary:  ?   Effort: Pulmonary effort is normal.  ?   Breath sounds: Normal breath sounds.  ?Neurological:  ?   Mental Status: She is alert and oriented to person, place, and time.  ?   Motor: Weakness present.  ?Psychiatric:     ?   Mood and Affect: Mood normal.     ?   Behavior: Behavior normal.  ? ? ? ?UC Treatments / Results  ?Labs ?(all labs ordered are listed, but only abnormal results are displayed) ?Labs Reviewed - No data to display ? ?EKG ? ? ?Radiology ?No results found. ? ?Procedures ?Procedures (including critical care time) ? ?Medications Ordered in UC ?Medications - No data to display ? ?Initial Impression / Assessment and Plan / UC Course  ?I have reviewed the triage vital signs and the nursing notes. ? ?Pertinent labs & imaging results that were available during my care of the patient were reviewed by me and considered in my medical decision making (see chart for  details). ? ?Patient was seen today for medication refill of seizure medication.  She took her last dose this morning.  She has a cyst within  the tumor that is growing and therefor having more seizures.  She is scheduled for a procedure this week.  She will call neuro if any further issues.  ?  ?Final Clinical Impressions(s) / UC Diagnoses  ? ?Final diagnoses:  ?Medication refill  ?Astrocytoma ([place])  ?Seizures ([place])  ? ? ? ?Discharge Instructions   ? ?  ?You were seen today for refill of medication.  ?I have printed this out for you to take to the pharmacy.  ?Please follow up with your neurologist as you have planned.  ? ? ? ?ED Prescriptions   ? ? Medication Sig Dispense Auth. Provider  ? Lacosamide 150 MG TABS  (Status: Discontinued) Take 1 tablet (150 mg total) by mouth 2 (two) times daily. 60 tablet Maniah Nading, MD  ? Lacosamide 150 MG TABS Take 1 tablet (150 mg total) by mouth 2 (two) times daily. 60 tablet Rondel Oh, MD  ? ?  ? ?PDMP not reviewed this encounter. ?  Rondel Oh, MD ?06/30/21 1502 ? ?

## 2021-06-30 NOTE — ED Triage Notes (Signed)
Pt presents for medication refill on seizure medication; pt states she took her last dose this morning. ? ?Pt has an appt with neurologist and surgeon on Wednesday.  ?

## 2021-07-01 NOTE — Progress Notes (Addendum)
Surgical Instructions ? ? ? Your procedure is scheduled on April 5th Wednesday. ? Report to [facility] Main Entrance "A" at 1:40 P.M., then check in with the Admitting office. ? Call this number if you have problems the morning of surgery: ? [phone] ? ? If you have any questions prior to your surgery date call [phone]: Open Monday-Friday 8am-4pm ? ? ? Remember: ? Do not eat or drink after midnight the night before your surgery ? ?  ? Take these medicines the morning of surgery with A SIP OF WATER: ?Lacosamide 150 MG TABS ?levETIRAcetam (KEPPRA) 1000 MG tablet ? ? ? ?As of today, STOP taking any Aspirin (unless otherwise instructed by your surgeon) Aleve, Naproxen, Ibuprofen, Motrin, Advil, Goody's, BC's, all herbal medications, fish oil, and all vitamins. ? ?         ?Do not wear jewelry or makeup ?Do not wear lotions, powders, perfumes, or deodorant. ?Do not shave 48 hours prior to surgery.   ?Do not wear nail polish, gel polish, artificial nails, or any other type of covering on natural nails (fingers and toes) ?If you have artificial nails or gel coating that need to be removed by a nail salon, please have this removed prior to surgery. Artificial nails or gel coating may interfere with anesthesia's ability to adequately monitor your vital signs. ? ?[facility] is not responsible for any belongings or valuables. .  ? ?Do NOT Smoke (Tobacco/Vaping)  24 hours prior to your procedure ? ?If you use a CPAP at night, you may bring your mask for your overnight stay. ?  ?Contacts, glasses, hearing aids, dentures or partials may not be worn into surgery, please bring cases for these belongings ?  ?For patients admitted to the hospital, discharge time will be determined by your treatment team. ?  ?Patients discharged the day of surgery will not be allowed to drive home, and someone needs to stay with them for 24 hours. ? ? ?SURGICAL WAITING ROOM VISITATION ?Patients having surgery or a procedure in a hospital may  have two support people. ?Children under the age of 30 must have an adult with them who is not the patient. ?They may stay in the waiting area during the procedure and may switch out with other visitors. If the patient needs to stay at the hospital during part of their recovery, the visitor guidelines for inpatient rooms apply. ? ?Please refer to the [facility] website for the visitor guidelines for Inpatients (after your surgery is over and you are in a regular room).  ? ? ? ? ? ?Special instructions:   ? ?Oral Hygiene is also important to reduce your risk of infection.  Remember - BRUSH YOUR TEETH THE MORNING OF SURGERY WITH YOUR REGULAR TOOTHPASTE ? ? ?Candace [name]- Preparing For Surgery ? ?Before surgery, you can play an important role. Because skin is not sterile, your skin needs to be as free of germs as possible. You can reduce the number of germs on your skin by washing with CHG (chlorahexidine gluconate) Soap before surgery.  CHG is an antiseptic cleaner which kills germs and bonds with the skin to continue killing germs even after washing.   ? ? ?Please do not use if you have an allergy to CHG or antibacterial soaps. If your skin becomes reddened/irritated stop using the CHG.  ?Do not shave (including legs and underarms) for at least 48 hours prior to first CHG shower. It is OK to shave your face. ? ?Please follow these  instructions carefully. ?  ? ? Shower the NIGHT BEFORE SURGERY and the MORNING OF SURGERY with CHG Soap.  ? If you chose to wash your hair, wash your hair first as usual with your normal shampoo. After you shampoo, rinse your hair and body thoroughly to remove the shampoo.  Then [employer] and genitals (private parts) with your normal soap and rinse thoroughly to remove soap. ? ?After that Use CHG Soap as you would any other liquid soap. You can apply CHG directly to the skin and wash gently with a scrungie or a clean washcloth.  ? ?Apply the CHG Soap to your body ONLY FROM THE NECK DOWN.  Do  not use on open wounds or open sores. Avoid contact with your eyes, ears, mouth and genitals (private parts). Wash Face and genitals (private parts)  with your normal soap.  ? ?Wash thoroughly, paying special attention to the area where your surgery will be performed. ? ?Thoroughly rinse your body with warm water from the neck down. ? ?DO NOT shower/wash with your normal soap after using and rinsing off the CHG Soap. ? ?Pat yourself dry with a CLEAN TOWEL. ? ?Wear CLEAN PAJAMAS to bed the night before surgery ? ?Place CLEAN SHEETS on your bed the night before your surgery ? ?DO NOT SLEEP WITH PETS. ? ? ?Day of Surgery: ? ?Take a shower with CHG soap. ?Wear Clean/Comfortable clothing the morning of surgery ?Do not apply any deodorants/lotions.   ?Remember to brush your teeth WITH YOUR REGULAR TOOTHPASTE. ? ? ? ?If you received a COVID test during your pre-op visit  it is requested that you wear a mask when out in public, stay away from anyone that may not be feeling well and notify your surgeon if you develop symptoms. If you have been in contact with anyone that has tested positive in the last 10 days please notify you surgeon. ? ?  ?Please read over the following fact sheets that you were given.  ? ?

## 2021-07-02 ENCOUNTER — Other Ambulatory Visit: Payer: Self-pay

## 2021-07-02 ENCOUNTER — Encounter (HOSPITAL_COMMUNITY)
Admission: RE | Admit: 2021-07-02 | Discharge: 2021-07-02 | Disposition: A | Discharge status: Home / Self Care | Payer: BC Managed Care – PPO | Source: Ambulatory Visit | Attending: Neurosurgery | Admitting: Neurosurgery

## 2021-07-02 ENCOUNTER — Other Ambulatory Visit: Payer: Self-pay | Admitting: Neurosurgery

## 2021-07-02 ENCOUNTER — Encounter (HOSPITAL_COMMUNITY): Payer: Self-pay | Admitting: *Deleted

## 2021-07-02 ENCOUNTER — Telehealth: Payer: Self-pay

## 2021-07-02 LAB — COMPREHENSIVE METABOLIC PANEL
ALT: 17 U/L (ref 0–44)
AST: 19 U/L (ref 15–41)
Albumin: 3.8 g/dL (ref 3.5–5.0)
Alkaline Phosphatase: 52 U/L (ref 38–126)
Anion gap: 8 (ref 5–15)
BUN: 14 mg/dL (ref 6–20)
CO2: 24 mmol/L (ref 22–32)
Calcium: 9.2 mg/dL (ref 8.9–10.3)
Chloride: 109 mmol/L (ref 98–111)
Creatinine, Ser: 1.12 mg/dL — ABNORMAL HIGH (ref 0.44–1.00)
GFR, Estimated: >60 mL/min (ref >60)
Glucose, Bld: 97 mg/dL (ref 70–99)
Potassium: 4.6 mmol/L (ref 3.5–5.1)
Sodium: 141 mmol/L (ref 135–145)
Total Bilirubin: 0.4 mg/dL (ref 0.3–1.2)
Total Protein: 6.5 g/dL (ref 6.5–8.1)

## 2021-07-02 LAB — CBC
HCT: 38.1 % (ref 36.0–46.0)
Hemoglobin: 13.8 g/dL (ref 12.0–15.0)
MCH: 33 pg (ref 26.0–34.0)
MCHC: 36.2 g/dL — ABNORMAL HIGH (ref 30.0–36.0)
MCV: 91.1 fL (ref 80.0–100.0)
Platelets: 217 10*3/uL (ref 150–400)
RBC: 4.18 MIL/uL (ref 3.87–5.11)
RDW: 11.4 % — ABNORMAL LOW (ref 11.5–15.5)
WBC: 5.7 10*3/uL (ref 4.0–10.5)
nRBC: 0 % (ref 0.0–0.2)

## 2021-07-02 NOTE — Progress Notes (Signed)
PCP - No PCP ?Cardiologist - Denies ? ?Chest x-ray - Not indicated  ?EKG - Not indicated  ?Stress Test - Denies ?ECHO - Denies ?Cardiac Cath - Denies ? ?Sleep Study - Denies ? ?Denies: DM ? ?COVID TEST- Not indicated ? ? ?Anesthesia review: No ? ?Patient denies shortness of breath, fever, cough and chest pain at PAT appointment ? ? ?All instructions explained to the patient, with a verbal understanding of the material. Patient agrees to go over the instructions while at home for a better understanding. The opportunity to ask questions was provided. ? ? ?

## 2021-07-02 NOTE — Telephone Encounter (Signed)
Pt called after hours RN due to needing a refill of Lacosamide '150mg'$  BID. Pt states she had a seizure within the past week. She was told to go to the ED. Pt presented to the ED on 06/30/2021 and the Lacosamide has been refilled. ?

## 2021-07-03 ENCOUNTER — Encounter (HOSPITAL_COMMUNITY): Payer: Self-pay

## 2021-07-03 ENCOUNTER — Inpatient Hospital Stay (HOSPITAL_COMMUNITY): Payer: BC Managed Care – PPO | Admitting: Certified Registered"

## 2021-07-03 ENCOUNTER — Other Ambulatory Visit: Payer: Self-pay

## 2021-07-03 ENCOUNTER — Inpatient Hospital Stay (HOSPITAL_COMMUNITY)
Admission: RE | Admit: 2021-07-03 | Discharge: 2021-07-04 | DRG: 027 | Disposition: A | Discharge status: Home / Self Care | Payer: BC Managed Care – PPO | Attending: Neurosurgery | Admitting: Neurosurgery

## 2021-07-03 ENCOUNTER — Encounter (HOSPITAL_COMMUNITY)
Admission: RE | Disposition: A | Discharge status: Home / Self Care | Payer: Self-pay | Source: Home / Self Care | Attending: Neurosurgery

## 2021-07-03 DIAGNOSIS — Z880 Allergy status to penicillin: Secondary | ICD-10-CM | POA: Diagnosis not present

## 2021-07-03 DIAGNOSIS — G930 Cerebral cysts: Secondary | ICD-10-CM | POA: Diagnosis present

## 2021-07-03 DIAGNOSIS — Z881 Allergy status to other antibiotic agents status: Secondary | ICD-10-CM

## 2021-07-03 DIAGNOSIS — Z91048 Other nonmedicinal substance allergy status: Secondary | ICD-10-CM | POA: Diagnosis not present

## 2021-07-03 DIAGNOSIS — Z79899 Other long term (current) drug therapy: Secondary | ICD-10-CM | POA: Diagnosis not present

## 2021-07-03 DIAGNOSIS — T368X5A Adverse effect of other systemic antibiotics, initial encounter: Secondary | ICD-10-CM | POA: Diagnosis present

## 2021-07-03 DIAGNOSIS — R569 Unspecified convulsions: Secondary | ICD-10-CM

## 2021-07-03 DIAGNOSIS — Z888 Allergy status to other drugs, medicaments and biological substances status: Secondary | ICD-10-CM | POA: Diagnosis not present

## 2021-07-03 DIAGNOSIS — C719 Malignant neoplasm of brain, unspecified: Secondary | ICD-10-CM | POA: Diagnosis present

## 2021-07-03 DIAGNOSIS — C711 Malignant neoplasm of frontal lobe: Secondary | ICD-10-CM | POA: Diagnosis present

## 2021-07-03 DIAGNOSIS — Z808 Family history of malignant neoplasm of other organs or systems: Secondary | ICD-10-CM

## 2021-07-03 DIAGNOSIS — Z9221 Personal history of antineoplastic chemotherapy: Secondary | ICD-10-CM | POA: Diagnosis not present

## 2021-07-03 DIAGNOSIS — R29810 Facial weakness: Secondary | ICD-10-CM | POA: Diagnosis present

## 2021-07-03 DIAGNOSIS — G9389 Other specified disorders of brain: Secondary | ICD-10-CM

## 2021-07-03 HISTORY — PX: APPLICATION OF CRANIAL NAVIGATION: SHX6578

## 2021-07-03 HISTORY — PX: CRANIOTOMY: SHX93

## 2021-07-03 HISTORY — DX: Urinary tract infection, site not specified: N39.0

## 2021-07-03 LAB — POCT PREGNANCY, URINE: Preg Test, Ur: NEGATIVE

## 2021-07-03 LAB — MRSA NEXT GEN BY PCR, NASAL: MRSA by PCR Next Gen: NOT DETECTED

## 2021-07-03 SURGERY — CRANIOTOMY HEMATOMA EVACUATION SUBDURAL
Anesthesia: General | Laterality: Left

## 2021-07-03 MED ORDER — ROCURONIUM BROMIDE 10 MG/ML (PF) SYRINGE
PREFILLED_SYRINGE | INTRAVENOUS | Status: DC | PRN
  Administered 2021-07-03: 60 mg via INTRAVENOUS
  Administered 2021-07-03: 40 mg via INTRAVENOUS

## 2021-07-03 MED ORDER — ACETAMINOPHEN 650 MG RE SUPP: 650.0000 mg | RECTAL | Status: DC | PRN

## 2021-07-03 MED ORDER — ENALAPRILAT 1.25 MG/ML IV SOLN: 1.2500 mg | Freq: Four times a day (QID) | INTRAVENOUS | Status: DC | PRN

## 2021-07-03 MED ORDER — SODIUM CHLORIDE 0.9 % IV SOLN: INTRAVENOUS | Status: DC

## 2021-07-03 MED ORDER — THROMBIN 20000 UNITS EX SOLR
CUTANEOUS | Status: AC
  Filled 2021-07-03: qty 20000

## 2021-07-03 MED ORDER — ACETAMINOPHEN 10 MG/ML IV SOLN
1000.0000 mg | Freq: Once | INTRAVENOUS | Status: AC
  Administered 2021-07-03: 1000 mg via INTRAVENOUS

## 2021-07-03 MED ORDER — PROPOFOL 10 MG/ML IV BOLUS
INTRAVENOUS | Status: DC | PRN
  Administered 2021-07-03: 150 mg via INTRAVENOUS
  Administered 2021-07-03: 30 mg via INTRAVENOUS

## 2021-07-03 MED ORDER — LEVETIRACETAM IN NACL 500 MG/100ML IV SOLN
500.0000 mg | Freq: Once | INTRAVENOUS | Status: AC
  Administered 2021-07-03: 500 mg via INTRAVENOUS
  Filled 2021-07-03: qty 100

## 2021-07-03 MED ORDER — FENTANYL CITRATE (PF) 250 MCG/5ML IJ SOLN
INTRAMUSCULAR | Status: DC | PRN
  Administered 2021-07-03: 100 ug via INTRAVENOUS
  Administered 2021-07-03: 50 ug via INTRAVENOUS

## 2021-07-03 MED ORDER — POLYETHYLENE GLYCOL 3350 17 G PO PACK: 17.0000 g | PACK | Freq: Every day | ORAL | Status: DC | PRN

## 2021-07-03 MED ORDER — FENTANYL CITRATE (PF) 250 MCG/5ML IJ SOLN
INTRAMUSCULAR | Status: AC
  Filled 2021-07-03: qty 5

## 2021-07-03 MED ORDER — DEXAMETHASONE SODIUM PHOSPHATE 10 MG/ML IJ SOLN
INTRAMUSCULAR | Status: DC | PRN
Start: 2021-07-03 — End: 2021-07-03
  Administered 2021-07-03: 10 mg via INTRAVENOUS

## 2021-07-03 MED ORDER — ONDANSETRON HCL 4 MG/2ML IJ SOLN
INTRAMUSCULAR | Status: DC | PRN
  Administered 2021-07-03: 4 mg via INTRAVENOUS

## 2021-07-03 MED ORDER — LIDOCAINE-EPINEPHRINE 1 %-1:100000 IJ SOLN
INTRAMUSCULAR | Status: DC | PRN
  Administered 2021-07-03: 3 mL

## 2021-07-03 MED ORDER — DEXAMETHASONE 0.5 MG PO TABS: 1.0000 mg | ORAL_TABLET | Freq: Every day | ORAL | Status: DC

## 2021-07-03 MED ORDER — LEVETIRACETAM 500 MG PO TABS
2000.0000 mg | ORAL_TABLET | Freq: Two times a day (BID) | ORAL | Status: DC
  Administered 2021-07-03 – 2021-07-04 (×2): 2000 mg via ORAL
  Filled 2021-07-03 (×2): qty 1

## 2021-07-03 MED ORDER — PROPOFOL 10 MG/ML IV BOLUS
INTRAVENOUS | Status: AC
  Filled 2021-07-03: qty 20

## 2021-07-03 MED ORDER — LACTATED RINGERS IV SOLN: INTRAVENOUS | Status: DC

## 2021-07-03 MED ORDER — MORPHINE SULFATE (PF) 2 MG/ML IV SOLN
1.0000 mg | INTRAVENOUS | Status: DC | PRN
  Administered 2021-07-03: 2 mg via INTRAVENOUS
  Filled 2021-07-03: qty 1

## 2021-07-03 MED ORDER — LIDOCAINE 2% (20 MG/ML) 5 ML SYRINGE
INTRAMUSCULAR | Status: DC | PRN
  Administered 2021-07-03: 60 mg via INTRAVENOUS

## 2021-07-03 MED ORDER — POTASSIUM CHLORIDE IN NACL 20-0.9 MEQ/L-% IV SOLN
INTRAVENOUS | Status: DC
  Filled 2021-07-03: qty 1000

## 2021-07-03 MED ORDER — THROMBIN 5000 UNITS EX SOLR
OROMUCOSAL | Status: DC | PRN
  Administered 2021-07-03: 5 mL via TOPICAL

## 2021-07-03 MED ORDER — HYDROMORPHONE HCL 1 MG/ML IJ SOLN
INTRAMUSCULAR | Status: AC
  Filled 2021-07-03: qty 1

## 2021-07-03 MED ORDER — HYDROCODONE-ACETAMINOPHEN 5-325 MG PO TABS
1.0000 | ORAL_TABLET | ORAL | Status: DC | PRN
  Administered 2021-07-03: 1 via ORAL
  Filled 2021-07-03: qty 1

## 2021-07-03 MED ORDER — ACETAMINOPHEN 325 MG PO TABS
650.0000 mg | ORAL_TABLET | ORAL | Status: DC | PRN
  Administered 2021-07-03: 650 mg via ORAL
  Filled 2021-07-03: qty 2

## 2021-07-03 MED ORDER — HYDROMORPHONE HCL 1 MG/ML IJ SOLN
0.2500 mg | INTRAMUSCULAR | Status: DC | PRN
  Administered 2021-07-03 (×2): 0.5 mg via INTRAVENOUS

## 2021-07-03 MED ORDER — DEXAMETHASONE 4 MG PO TABS
4.0000 mg | ORAL_TABLET | Freq: Three times a day (TID) | ORAL | Status: DC
  Administered 2021-07-03 – 2021-07-04 (×4): 4 mg via ORAL
  Filled 2021-07-03 (×5): qty 1

## 2021-07-03 MED ORDER — PHENYLEPHRINE HCL-NACL 20-0.9 MG/250ML-% IV SOLN
INTRAVENOUS | Status: DC | PRN
  Administered 2021-07-03: 30 ug/min via INTRAVENOUS

## 2021-07-03 MED ORDER — ONDANSETRON HCL 4 MG PO TABS: 4.0000 mg | ORAL_TABLET | ORAL | Status: DC | PRN

## 2021-07-03 MED ORDER — HEMOSTATIC AGENTS (NO CHARGE) OPTIME
TOPICAL | Status: DC | PRN
  Administered 2021-07-03: 1 via TOPICAL

## 2021-07-03 MED ORDER — PANTOPRAZOLE SODIUM 40 MG PO TBEC
40.0000 mg | DELAYED_RELEASE_TABLET | Freq: Every day | ORAL | Status: DC
  Filled 2021-07-03: qty 1

## 2021-07-03 MED ORDER — CHLORHEXIDINE GLUCONATE CLOTH 2 % EX PADS: 6.0000 | MEDICATED_PAD | Freq: Once | CUTANEOUS | Status: DC

## 2021-07-03 MED ORDER — FENTANYL CITRATE (PF) 100 MCG/2ML IJ SOLN: 25.0000 ug | INTRAMUSCULAR | Status: DC | PRN

## 2021-07-03 MED ORDER — MIDAZOLAM HCL 2 MG/2ML IJ SOLN
INTRAMUSCULAR | Status: AC
  Filled 2021-07-03: qty 2

## 2021-07-03 MED ORDER — 0.9 % SODIUM CHLORIDE (POUR BTL) OPTIME
TOPICAL | Status: DC | PRN
  Administered 2021-07-03: 3000 mL

## 2021-07-03 MED ORDER — BACITRACIN ZINC 500 UNIT/GM EX OINT
TOPICAL_OINTMENT | CUTANEOUS | Status: AC
  Filled 2021-07-03: qty 28.35

## 2021-07-03 MED ORDER — PHENYLEPHRINE HCL (PRESSORS) 10 MG/ML IV SOLN
INTRAVENOUS | Status: AC
  Filled 2021-07-03: qty 1

## 2021-07-03 MED ORDER — ORAL CARE MOUTH RINSE: 15.0000 mL | Freq: Once | OROMUCOSAL | Status: AC

## 2021-07-03 MED ORDER — DEXAMETHASONE 4 MG PO TABS: 2.0000 mg | ORAL_TABLET | Freq: Three times a day (TID) | ORAL | Status: DC

## 2021-07-03 MED ORDER — LABETALOL HCL 5 MG/ML IV SOLN: 10.0000 mg | INTRAVENOUS | Status: DC | PRN

## 2021-07-03 MED ORDER — THROMBIN 20000 UNITS EX SOLR
CUTANEOUS | Status: DC | PRN
  Administered 2021-07-03: 20 mL via TOPICAL

## 2021-07-03 MED ORDER — CHLORHEXIDINE GLUCONATE 0.12 % MT SOLN
15.0000 mL | Freq: Once | OROMUCOSAL | Status: AC
  Administered 2021-07-03: 15 mL via OROMUCOSAL
  Filled 2021-07-03: qty 15

## 2021-07-03 MED ORDER — MIDAZOLAM HCL 5 MG/5ML IJ SOLN
INTRAMUSCULAR | Status: DC | PRN
  Administered 2021-07-03: 2 mg via INTRAVENOUS

## 2021-07-03 MED ORDER — NICARDIPINE HCL IN NACL 20-0.86 MG/200ML-% IV SOLN: 3.0000 mg/h | INTRAVENOUS | Status: DC

## 2021-07-03 MED ORDER — ACETAMINOPHEN 10 MG/ML IV SOLN
INTRAVENOUS | Status: AC
  Filled 2021-07-03: qty 100

## 2021-07-03 MED ORDER — DEXAMETHASONE 0.5 MG PO TABS: 1.0000 mg | ORAL_TABLET | Freq: Two times a day (BID) | ORAL | Status: DC

## 2021-07-03 MED ORDER — FLEET ENEMA 7-19 GM/118ML RE ENEM: 1.0000 | ENEMA | Freq: Once | RECTAL | Status: DC | PRN

## 2021-07-03 MED ORDER — LIDOCAINE-EPINEPHRINE 1 %-1:100000 IJ SOLN
INTRAMUSCULAR | Status: AC
  Filled 2021-07-03: qty 1

## 2021-07-03 MED ORDER — DOCUSATE SODIUM 100 MG PO CAPS
100.0000 mg | ORAL_CAPSULE | Freq: Two times a day (BID) | ORAL | Status: DC
  Filled 2021-07-03: qty 1

## 2021-07-03 MED ORDER — DEXAMETHASONE 4 MG PO TABS: 2.0000 mg | ORAL_TABLET | Freq: Two times a day (BID) | ORAL | Status: DC

## 2021-07-03 MED ORDER — EPHEDRINE SULFATE-NACL 50-0.9 MG/10ML-% IV SOSY
PREFILLED_SYRINGE | INTRAVENOUS | Status: DC | PRN
  Administered 2021-07-03: 5 mg via INTRAVENOUS

## 2021-07-03 MED ORDER — PANTOPRAZOLE SODIUM 40 MG PO TBEC: 40.0000 mg | DELAYED_RELEASE_TABLET | Freq: Every day | ORAL | Status: DC

## 2021-07-03 MED ORDER — VANCOMYCIN HCL 500 MG/100ML IV SOLN
500.0000 mg | Freq: Two times a day (BID) | INTRAVENOUS | Status: DC
  Administered 2021-07-03: 500 mg via INTRAVENOUS
  Filled 2021-07-03 (×2): qty 100

## 2021-07-03 MED ORDER — SUGAMMADEX SODIUM 200 MG/2ML IV SOLN
INTRAVENOUS | Status: DC | PRN
  Administered 2021-07-03: 200 mg via INTRAVENOUS

## 2021-07-03 MED ORDER — THROMBIN 5000 UNITS EX SOLR
CUTANEOUS | Status: AC
  Filled 2021-07-03: qty 5000

## 2021-07-03 MED ORDER — VANCOMYCIN HCL IN DEXTROSE 1-5 GM/200ML-% IV SOLN
1000.0000 mg | INTRAVENOUS | Status: AC
  Administered 2021-07-03: 1000 mg via INTRAVENOUS
  Filled 2021-07-03: qty 200

## 2021-07-03 MED ORDER — LACOSAMIDE 50 MG PO TABS
150.0000 mg | ORAL_TABLET | Freq: Two times a day (BID) | ORAL | Status: DC
  Administered 2021-07-03 – 2021-07-04 (×2): 150 mg via ORAL
  Filled 2021-07-03 (×2): qty 3

## 2021-07-03 MED ORDER — PROMETHAZINE HCL 25 MG PO TABS: 12.5000 mg | ORAL_TABLET | ORAL | Status: DC | PRN

## 2021-07-03 MED ORDER — PHENYLEPHRINE 40 MCG/ML (10ML) SYRINGE FOR IV PUSH (FOR BLOOD PRESSURE SUPPORT)
PREFILLED_SYRINGE | INTRAVENOUS | Status: DC | PRN
Start: 2021-07-03 — End: 2021-07-03
  Administered 2021-07-03: 120 ug via INTRAVENOUS
  Administered 2021-07-03: 80 ug via INTRAVENOUS

## 2021-07-03 MED ORDER — MAGNESIUM GLUCONATE 500 MG PO TABS
250.0000 mg | ORAL_TABLET | Freq: Every day | ORAL | Status: DC
  Administered 2021-07-04: 250 mg via ORAL
  Filled 2021-07-03 (×2): qty 1

## 2021-07-03 MED ORDER — ONDANSETRON HCL 4 MG/2ML IJ SOLN: 4.0000 mg | INTRAMUSCULAR | Status: DC | PRN

## 2021-07-03 SURGICAL SUPPLY — 80 items
BAG COUNTER SPONGE SURGICOUNT (BAG) ×3 IMPLANT
BENZOIN TINCTURE PRP APPL 2/3 (GAUZE/BANDAGES/DRESSINGS) ×1 IMPLANT
BLADE CLIPPER SURG (BLADE) ×2 IMPLANT
BNDG GAUZE ELAST 4 BULKY (GAUZE/BANDAGES/DRESSINGS) IMPLANT
BUR ACORN 9.0 PRECISION (BURR) ×2 IMPLANT
BUR SPIRAL ROUTER 2.3 (BUR) ×2 IMPLANT
CANISTER SUCT 3000ML PPV (MISCELLANEOUS) ×2 IMPLANT
CARTRIDGE OIL MAESTRO DRILL (MISCELLANEOUS) ×1 IMPLANT
CATH VENTRIC 35X38 W/TROCAR LG (CATHETERS) IMPLANT
CLIP VESOCCLUDE MED 6/CT (CLIP) IMPLANT
DERMABOND ADVANCED (GAUZE/BANDAGES/DRESSINGS)
DERMABOND ADVANCED .7 DNX12 (GAUZE/BANDAGES/DRESSINGS) ×1 IMPLANT
DIFFUSER DRILL AIR PNEUMATIC (MISCELLANEOUS) ×2 IMPLANT
DRAPE MICROSCOPE LEICA (MISCELLANEOUS) IMPLANT
DRAPE NEUROLOGICAL W/INCISE (DRAPES) ×2 IMPLANT
DRAPE SHEET LG 3/4 BI-LAMINATE (DRAPES) ×2 IMPLANT
DRAPE WARM FLUID 44X44 (DRAPES) ×2 IMPLANT
DRSG AQUACEL AG ADV 3.5X 6 (GAUZE/BANDAGES/DRESSINGS) ×1 IMPLANT
DRSG MEPILEX BORDER 4X4 (GAUZE/BANDAGES/DRESSINGS) IMPLANT
DRSG XEROFORM 1X8 (GAUZE/BANDAGES/DRESSINGS) IMPLANT
DURAPREP 26ML APPLICATOR (WOUND CARE) ×2 IMPLANT
ELECT COATED BLADE 2.86 ST (ELECTRODE) ×2 IMPLANT
ELECT REM PT RETURN 9FT ADLT (ELECTROSURGICAL) ×2
ELECTRODE REM PT RTRN 9FT ADLT (ELECTROSURGICAL) ×1 IMPLANT
EVACUATOR 1/8 PVC DRAIN (DRAIN) IMPLANT
EVACUATOR SILICONE 100CC (DRAIN) IMPLANT
FORCEPS BIPOLAR SPETZLER 8 1.0 (NEUROSURGERY SUPPLIES) ×1 IMPLANT
GAUZE 4X4 16PLY ~~LOC~~+RFID DBL (SPONGE) ×1 IMPLANT
GAUZE SPONGE 4X4 12PLY STRL (GAUZE/BANDAGES/DRESSINGS) ×2 IMPLANT
GLOVE SURG ENC MOIS LTX SZ8 (GLOVE) ×4 IMPLANT
GLOVE SURG LTX SZ7.5 (GLOVE) ×2 IMPLANT
GLOVE SURG UNDER POLY LF SZ7.5 (GLOVE) ×4 IMPLANT
GLOVE SURG UNDER POLY LF SZ8.5 (GLOVE) ×4 IMPLANT
GOWN STRL REUS W/ TWL LRG LVL3 (GOWN DISPOSABLE) ×2 IMPLANT
GOWN STRL REUS W/ TWL XL LVL3 (GOWN DISPOSABLE) ×2 IMPLANT
GOWN STRL REUS W/TWL 2XL LVL3 (GOWN DISPOSABLE) IMPLANT
GOWN STRL REUS W/TWL LRG LVL3 (GOWN DISPOSABLE)
GOWN STRL REUS W/TWL XL LVL3 (GOWN DISPOSABLE) ×3
HEMOSTAT POWDER KIT SURGIFOAM (HEMOSTASIS) ×2 IMPLANT
HEMOSTAT SURGICEL 2X14 (HEMOSTASIS) ×2 IMPLANT
HOOK RETRACTION 12 ELAST STAY (MISCELLANEOUS) IMPLANT
KIT BASIN OR (CUSTOM PROCEDURE TRAY) ×2 IMPLANT
KIT NDL BIOPSY 1.8X235 CRAN (NEEDLE) IMPLANT
KIT NEEDLE BIOPSY 1.8X235 CRAN (NEEDLE) ×2 IMPLANT
KIT TURNOVER KIT B (KITS) ×2 IMPLANT
KIT VARIOGUIDE DRILL 1.9 (KITS) ×1 IMPLANT
MARKER SPHERE PSV REFLC 13MM (MARKER) ×5 IMPLANT
NEEDLE HYPO 22GX1.5 SAFETY (NEEDLE) ×2 IMPLANT
NS IRRIG 1000ML POUR BTL (IV SOLUTION) ×6 IMPLANT
OIL CARTRIDGE MAESTRO DRILL (MISCELLANEOUS)
PACK CRANIOTOMY CUSTOM (CUSTOM PROCEDURE TRAY) ×2 IMPLANT
PATTIES SURGICAL .5 X.5 (GAUZE/BANDAGES/DRESSINGS) IMPLANT
PATTIES SURGICAL .5 X3 (DISPOSABLE) IMPLANT
PATTIES SURGICAL 1X1 (DISPOSABLE) IMPLANT
PERFORATOR LRG  14-11MM (BIT)
PERFORATOR LRG 14-11MM (BIT) IMPLANT
SEPRAFILM MEMBRANE 5X6 (MISCELLANEOUS) IMPLANT
SPONGE NEURO XRAY DETECT 1X3 (DISPOSABLE) IMPLANT
SPONGE SURGIFOAM ABS GEL 100 (HEMOSTASIS) ×2 IMPLANT
SPONGE T-LAP 18X18 ~~LOC~~+RFID (SPONGE) ×1 IMPLANT
STAPLER VISISTAT 35W (STAPLE) ×4 IMPLANT
STOCKINETTE 6  STRL (DRAPES) ×1
STOCKINETTE 6 STRL (DRAPES) ×1 IMPLANT
STRIP CLOSURE SKIN 1/2X4 (GAUZE/BANDAGES/DRESSINGS) ×1 IMPLANT
SUT ETHILON 3 0 FSL (SUTURE) IMPLANT
SUT ETHILON 3 0 PS 1 (SUTURE) IMPLANT
SUT MON AB 3-0 SH 27 (SUTURE)
SUT MON AB 3-0 SH27 (SUTURE) IMPLANT
SUT NURALON 4 0 TR CR/8 (SUTURE) ×2 IMPLANT
SUT VIC AB 2-0 CP2 18 (SUTURE) ×4 IMPLANT
SUT VIC AB 3-0 SH 8-18 (SUTURE) IMPLANT
SUT VICRYL RAPIDE 3 0 (SUTURE) IMPLANT
SYSTEM CSF EXTERNAL DRAINAGE (MISCELLANEOUS) IMPLANT
TAPE PAPER 1X10 WHT MICROPORE (GAUZE/BANDAGES/DRESSINGS) ×2 IMPLANT
TAPE PAPER 2X10 WHT MICROPORE (GAUZE/BANDAGES/DRESSINGS) ×2 IMPLANT
TOWEL GREEN STERILE (TOWEL DISPOSABLE) ×2 IMPLANT
TOWEL GREEN STERILE FF (TOWEL DISPOSABLE) ×2 IMPLANT
TRAY FOLEY MTR SLVR 16FR STAT (SET/KITS/TRAYS/PACK) ×1 IMPLANT
TUBE CONNECTING 20X1/4 (TUBING) ×2 IMPLANT
WATER STERILE IRR 1000ML POUR (IV SOLUTION) ×2 IMPLANT

## 2021-07-03 NOTE — Anesthesia Preprocedure Evaluation (Addendum)
Anesthesia Evaluation  ?Patient identified by MRN, date of birth, ID band ?Patient awake ? ? ? ?Reviewed: ?Allergy & Precautions, NPO status , Patient's Chart, lab work & pertinent test results ? ?Airway ?Mallampati: II ? ?TM Distance: >3 FB ? ? ? ? Dental ?  ?Pulmonary ? ?  ?breath sounds clear to auscultation ? ? ? ? ? ? Cardiovascular ?negative cardio ROS ? ? ?Rhythm:Regular Rate:Normal ? ? ?  ?Neuro/Psych ? Headaches, Seizures -,    ? GI/Hepatic ?negative GI ROS, Neg liver ROS,   ?Endo/Other  ?negative endocrine ROS ? Renal/GU ?negative Renal ROS  ? ?  ?Musculoskeletal ? ? Abdominal ?  ?Peds ? Hematology ?  ?Anesthesia Other Findings ? ? Reproductive/Obstetrics ? ?  ? ? ? ? ? ? ? ? ? ? ? ? ? ?  ?  ? ? ? ? ? ? ? ? ?Anesthesia Physical ?Anesthesia Plan ? ?ASA: 3 ? ?Anesthesia Plan: General  ? ?Post-op Pain Management:   ? ?Induction:  ? ?PONV Risk Score and Plan: 3 and Ondansetron, Dexamethasone and Midazolam ? ?Airway Management Planned: Oral ETT ? ?Additional Equipment:  ? ?Intra-op Plan:  ? ?Post-operative Plan: Extubation in OR ? ?Informed Consent: I have reviewed the patients History and Physical, chart, labs and discussed the procedure including the risks, benefits and alternatives for the proposed anesthesia with the patient or authorized representative who has indicated his/her understanding and acceptance.  ? ? ? ?Dental advisory given ? ?Plan Discussed with: CRNA and Anesthesiologist ? ?Anesthesia Plan Comments:   ? ? ? ? ? ?Anesthesia Quick Evaluation ? ?

## 2021-07-03 NOTE — Transfer of Care (Signed)
Immediate Anesthesia Transfer of Care Note ? ?Patient: Candace [name] ? ?Procedure(s) Performed: STEREOTACTIC DRAINAGE OF LEFT FRONTAL LOBE CYST (Left) ?APPLICATION OF CRANIAL NAVIGATION (Left) ? ?Patient Location: PACU ? ?Anesthesia Type:General ? ?Level of Consciousness: awake, alert  and oriented ? ?Airway & Oxygen Therapy: Patient Spontanous Breathing ? ?Post-op Assessment: Report given to RN and Post -op Vital signs reviewed and stable ? ?Post vital signs: Reviewed and stable ? ?Last Vitals:  ?Vitals Value Taken Time  ?BP 101/61 07/03/21 1125  ?Temp    ?Pulse 73 07/03/21 1127  ?Resp 18 07/03/21 1127  ?SpO2 93 % 07/03/21 1127  ?Vitals shown include unvalidated device data. ? ?Last Pain:  ?Vitals:  ? 07/03/21 0740  ?TempSrc:   ?PainSc: 0-No pain  ?   ? ?  ? ?Complications: No notable events documented. ?

## 2021-07-03 NOTE — H&P (Signed)
CC: speech difficulty ? ?HPI: ? ?  ? ?Patient is a 41 y.o. female with anaplastic astrocytoma of left frontotemporoinsular lobes developed worsening speech and increased seizure frequency.  She was found on serial imaging to have an enlarging brain cyst.  After interdisciplinary discussion, cyst drainage was recommended to potentially palliate her neurologic symptoms.  ? ? ? ?Patient Active Problem List  ? Diagnosis Date Noted  ? Seizure ([place]) 10/28/2020  ? Primary malignant neoplasm of frontal lobe ([place]) 01/13/2020  ? Focal seizure ([place]) 12/07/2019  ? Astrocytoma of frontal lobe ([place]) 12/07/2019  ? ?Past Medical History:  ?Diagnosis Date  ? Brain tumor ([place])   ? Complication of anesthesia 2005  ? trouble waking  ? Headache   ? Seizure ([place]) 2020  ? UTI (urinary tract infection)   ?  ?Past Surgical History:  ?Procedure Laterality Date  ? ABDOMINAL SURGERY    ? APPLICATION OF CRANIAL NAVIGATION Left 12/08/2019  ? Procedure: APPLICATION OF CRANIAL NAVIGATION;  Surgeon: Vallarie Mare, MD;  Location: [facility];  Service: Neurosurgery;  Laterality: Left;  APPLICATION OF CRANIAL NAVIGATION  ? FRAMELESS  BIOPSY WITH BRAINLAB Left 12/08/2019  ? Procedure: Left stereotactic brain biopsy with brainlab;  Surgeon: Vallarie Mare, MD;  Location: [facility];  Service: Neurosurgery;  Laterality: Left;  Left stereotactic brain biopsy with brainlab  ? WISDOM TOOTH EXTRACTION    ?  ?Medications Prior to Admission  ?Medication Sig Dispense Refill Last Dose  ? ibuprofen (ADVIL) 200 MG tablet Take 400 mg by mouth every 8 (eight) hours as needed for headache.   Past Month  ? Lacosamide 150 MG TABS Take 1 tablet (150 mg total) by mouth 2 (two) times daily. 60 tablet 0 07/03/2021 at 0810  ? levETIRAcetam (KEPPRA) 1000 MG tablet TAKE 2 TABLETS(2000 MG) BY MOUTH TWICE DAILY 180 tablet 1 07/03/2021 at 0810  ? LYCOPENE PO Take 1 capsule by mouth daily.   Past Week  ? magnesium gluconate (MAGONATE) 500 MG tablet Take 250 mg by mouth daily.   Past Week   ? MELATONIN PO Take 1 tablet by mouth at bedtime.   Past Week  ? Omega-3 Fatty Acids (FISH OIL PO) Take 2 capsules by mouth in the morning, at noon, and at bedtime.   07/02/2021  ? OVER THE COUNTER MEDICATION Take 1 capsule by mouth daily. [place] mushroom   Past Week  ? OVER THE COUNTER MEDICATION Take 1 tablet by mouth daily. Berberine   Past Week  ? OVER THE COUNTER MEDICATION Take 1 capsule by mouth daily. Frankincense   07/02/2021  ? OVER THE COUNTER MEDICATION Take 1 tablet by mouth daily. Vit C Zinc Vit D   Past Week  ? RESVERATROL PO Take 1 capsule by mouth at bedtime.   Past Week  ? TURMERIC PO Take 1 tablet by mouth daily.     ? LORazepam (ATIVAN) 2 MG tablet Take 1 tablet (2 mg total) by mouth every 8 (eight) hours. (Patient not taking: Reported on 06/28/2021) 30 tablet 0 Not Taking  ? ?Allergies  ?Allergen Reactions  ? Trazodone And Nefazodone Itching  ? Amitriptyline   ?  Severe joint pain   ? Chlorhexidine Itching  ? Penicillins Rash  ?  ?Social History  ? ?Tobacco Use  ? Smoking status: Never  ? Smokeless tobacco: Never  ?Substance Use Topics  ? Alcohol use: Yes  ?  Comment: social  ?  ?Family History  ?Problem Relation Age of Onset  ?  Migraines Mother   ? Thyroid cancer Father   ? Testicular cancer Father   ?  ? ?Review of Systems ?Pertinent items are noted in HPI. ? ?Objective:  ? ?Patient Vitals for the past 8 hrs: ? BP Temp Temp src Pulse Resp SpO2  ?07/03/21 0727 113/75 98.2 ?F (36.8 ?C) Oral 72 17 100 %  ? ?No intake/output data recorded. ?No intake/output data recorded. ? ? ? ? ? General : Alert, cooperative, no distress, appears stated age  ? Head:  Normocephalic/atraumatic   ? Eyes: PERRL, conjunctiva/corneas clear, EOM's intact. Fundi could not be visualized ?Neck: Supple ?Chest:  Respirations unlabored ?Chest wall: no tenderness or deformity ?Heart: Regular rate and rhythm ?Abdomen: Soft, nontender and nondistended ?Extremities: warm and well-perfused ?Skin: normal turgor, color and  texture ?Neurologic:  Alert, oriented x 3.  Eyes open spontaneously. PERRL, EOMI, VFC, no facial droop. V1-3 intact.  No dysarthria, tongue protrusion symmetric.  CNII-XII intact. Normal strength, sensation and reflexes throughout.  full strength in legs. Word finding difficulty, mild left facial droop, pronator drift.  ?  ? ? ? ?Data ReviewCBC:  ?Lab Results  ?Component Value Date  ? WBC 5.7 07/02/2021  ? RBC 4.18 07/02/2021  ? ?BMP:  ?Lab Results  ?Component Value Date  ? GLUCOSE 97 07/02/2021  ? CO2 24 07/02/2021  ? BUN 14 07/02/2021  ? CREATININE 1.12 (H) 07/02/2021  ? CREATININE 0.99 08/20/2020  ? CALCIUM 9.2 07/02/2021  ? ?Radiology review:  ?See clinic note for details ? ?Assessment:  ? ?Active Problems: ?  * No active hospital problems. * ? ?Astrocytoma s/p chemoXRT with enlarging symptomatic cystic changes ? ?Plan:  ? ?- plan for stereotactic drainage of cyst ? ? ?

## 2021-07-03 NOTE — Progress Notes (Signed)
Pt is s/p stereptactic drainage of frontal cyst. Order for 24 hr post of prophylaxis with vanc. She got a dose at 0800 this AM. We will give vanc '500mg'$  IV q12 starting tonight x 2 doses.  ? ?Rx signs off ? ?Onnie Boer, PharmD, BCIDP, AAHIVP, CPP ?Infectious Disease Pharmacist ?07/03/2021 4:25 PM ? ? ?

## 2021-07-03 NOTE — Op Note (Signed)
Procedure(s): ?STEREOTACTIC DRAINAGE OF LEFT FRONTAL LOBE CYST ?APPLICATION OF CRANIAL NAVIGATION Procedure Note ? ?Candace [name] ?female ?41 y.o. ?07/03/2021 ? ?Procedure(s) and Anesthesia Type: ?   * STEREOTACTIC DRAINAGE OF LEFT FRONTAL LOBE CYST - General ?   * APPLICATION OF CRANIAL NAVIGATION - General ? ?Surgeon(s) and Role: ?   Marcello Moores, Dorcas Carrow, MD - Primary ? ? ?Indications: This is a 41 year old woman with an astrocytoma status postchemotherapy and radiation who developed progressively enlarging cystic change with accompanying worsening speech and increasing frequency of seizures.  She also began developing right-sided weakness.  After interdisciplinary discussion, various treatment options were discussed with the patient and husband.  Options of awake craniotomy for subtotal resection or cyst drainage was discussed.  They wish to proceed with cyst drainage.  They understood there is high likelihood of cyst recurrence eventually.  The general technique of surgery, as well as risk, benefits, alternatives, and expected convalescence were discussed.  Risk discussed included, but were not limited to, bleeding, pain, infection, scar, seizure, neurologic deficit, coma, nondiagnostic sample, and death.  Informed consent was obtained. ?    ?Surgeon: Vallarie Mare  ? ?Assistants: Weston Brass, NP. Please note there were no qualified trainees available to assist with the procedure. ? ?Anesthesia: General endotracheal anesthesia ? ?ASA Class: 2 ? ? ?Procedure Detail ? ?STEREOTACTIC DRAINAGE OF LEFT FRONTAL LOBE CYST, APPLICATION OF CRANIAL NAVIGATION ? ?The patient was brought to the operating.  General anesthesia was induced and patient was intubated by the anesthesia service.  She was positioned supine with all pressure points padded and eyes protected.  Head was placed in [place] head holder and affixed to the bed.  Head was turned slightly to the right.  Using a preoperative thin cut MRI, a 3D  reconstruction of the face and scalp was created.  Surface match registration with the Ava system was performed to allow for intraoperative neuro navigation throughout the case.  A trajectory was planned near her previous biopsy site which would enter the cyst at its superficial location and traversed through a loculation in the center of the cyst.  The scalp was preprepped and prepped and draped in sterile fashion.  A timeout was performed.  The BrainLab Varioguide was locked to the appropriate trajectory.  A small stab incision was made in the scalp with a 15 blade.  Reducing cannula was locked into the Varioguide and high-speed drill was used to drill a small twist drill hole in the skull.  The dura was punctured with a trocar.  The navigated brain biopsy needle was then passed into the cyst and 24 mL of fluid was aspirated.  No bleeding was encountered.  When no further fluid could be aspirated, the needle was withdrawn.  The stab incision was irrigated thoroughly.  Skin was closed with 3-0 Vicryl Rapide followed by bacitracin.  Patient was then removed from the [place] head holder and extubated by the anesthesia service.  All counts were correct at the end of surgery.  No complications were noted. ? ?Findings: ?Yellow-tinged fluid.  No bleeding encountered. ? ?Estimated Blood Loss:  Minimal ?        ?Drains: None ?        ?Total IV Fluids: See anesthesia records ? ?Blood Given: None ?        ?Specimens: Left frontal lobe cyst fluid ?        ?Implants: None ?       ?Complications:  * No complications entered in  OR log * ?        ?Disposition: PACU - hemodynamically stable. ?        ?Condition: stable ?   ?

## 2021-07-03 NOTE — Anesthesia Procedure Notes (Signed)
Procedure Name: Intubation ?Date/Time: 07/03/2021 9:34 AM ?Performed by: Griffin Dakin, CRNA ?Pre-anesthesia Checklist: Patient identified, Emergency Drugs available, Suction available and Patient being monitored ?Patient Re-evaluated:Patient Re-evaluated prior to induction ?Oxygen Delivery Method: Circle system utilized ?Preoxygenation: Pre-oxygenation with 100% oxygen ?Induction Type: IV induction ?Ventilation: Mask ventilation without difficulty ?Laryngoscope Size: Mac and 4 ?Grade View: Grade I ?Tube type: Oral ?Tube size: 7.0 mm ?Number of attempts: 1 ?Airway Equipment and Method: Stylet and Oral airway ?Placement Confirmation: ETT inserted through vocal cords under direct vision, positive ETCO2 and breath sounds checked- equal and bilateral ?Secured at: 23 cm ?Tube secured with: Tape ?Dental Injury: Teeth and Oropharynx as per pre-operative assessment  ? ? ? ? ?

## 2021-07-03 NOTE — Anesthesia Postprocedure Evaluation (Signed)
Anesthesia Post Note ? ?Patient: Candace [name] ? ?Procedure(s) Performed: STEREOTACTIC DRAINAGE OF LEFT FRONTAL LOBE CYST (Left) ?APPLICATION OF CRANIAL NAVIGATION (Left) ? ?  ? ?Patient location during evaluation: PACU ?Anesthesia Type: General ?Level of consciousness: awake ?Pain management: pain level controlled ?Respiratory status: spontaneous breathing ?Cardiovascular status: stable ?Postop Assessment: no apparent nausea or vomiting ?Anesthetic complications: no ? ? ?No notable events documented. ? ?Last Vitals:  ?Vitals:  ? 07/03/21 1210 07/03/21 1225  ?BP: 99/60 (!) 93/58  ?Pulse: 66 63  ?Resp: 13 12  ?Temp:    ?SpO2: 98% 98%  ?  ?Last Pain:  ?Vitals:  ? 07/03/21 1155  ?TempSrc:   ?PainSc: Asleep  ? ? ?  ?  ?  ?  ?  ?  ? ?[name] ? ? ? ? ?

## 2021-07-04 ENCOUNTER — Inpatient Hospital Stay (HOSPITAL_COMMUNITY): Payer: BC Managed Care – PPO

## 2021-07-04 ENCOUNTER — Encounter (HOSPITAL_COMMUNITY): Payer: Self-pay | Admitting: Neurosurgery

## 2021-07-04 LAB — CYTOLOGY - NON PAP

## 2021-07-04 IMAGING — CT CT HEAD W/O CM
4 series · 16 of 47 positions shown, 18 images · non-contrast
Comparison: Brain MRI [DATE]

CLINICAL DATA: Recent surgery



[Series 3: head without · axial · non-contrast · 0.43mm/px · z∈[-90,+30]mm · 7 of 33 slices shown, 9 images]
[im 5/33  brain]
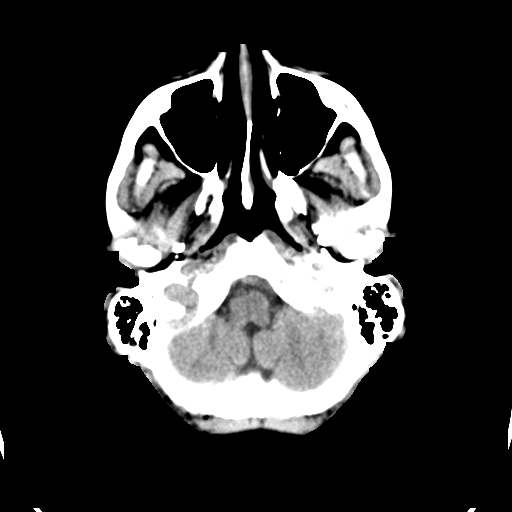
[im 5/33  bone]
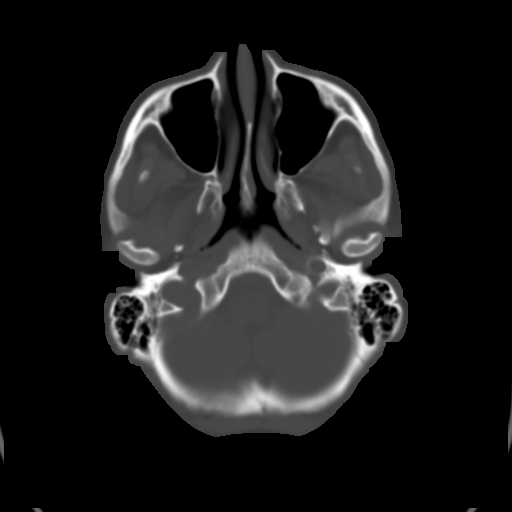
[im 9/33  brain]
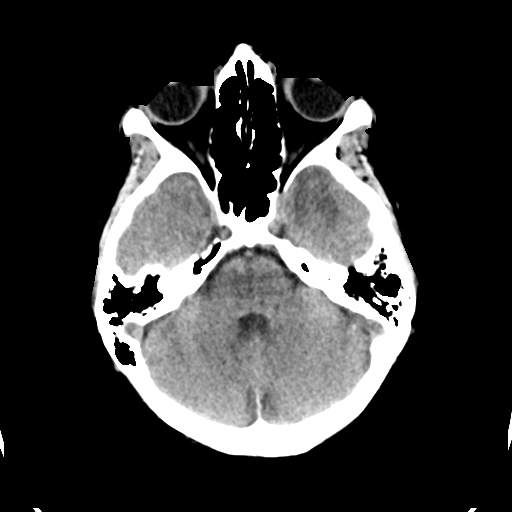
[im 13/33  brain]
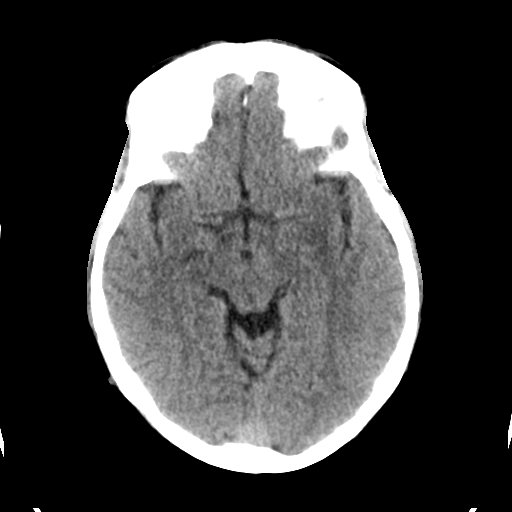
[im 17/33  brain]
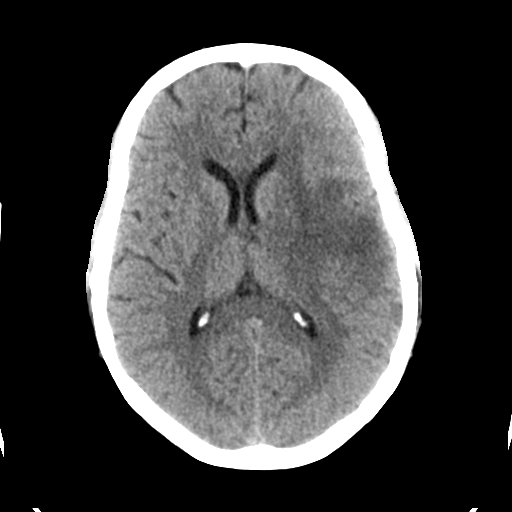
[im 21/33  brain]
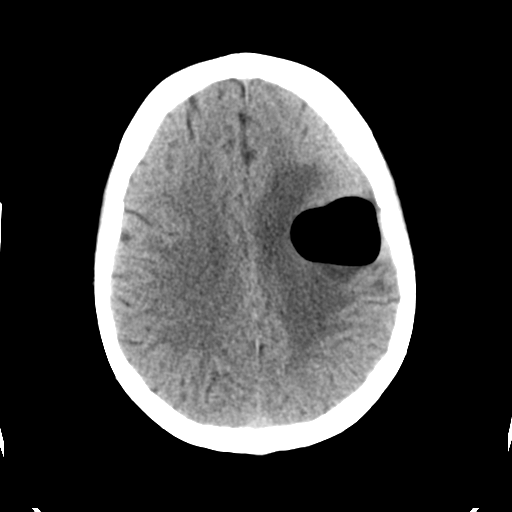
[im 21/33  bone]
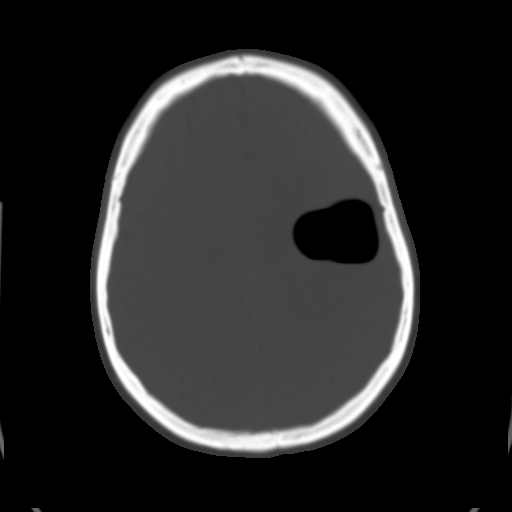
[im 25/33  brain]
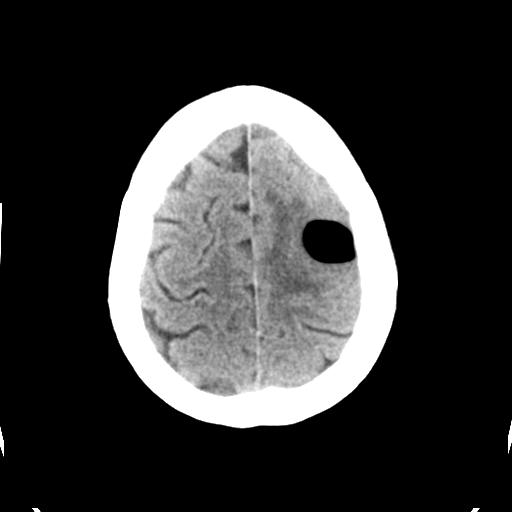
[im 29/33  brain]
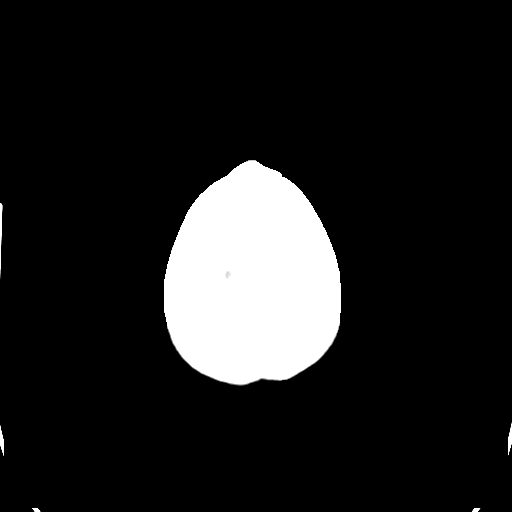

[Series 4: head bone · axial · 0.43mm/px · z∈[-94,-62]mm · 3 of 83 slices shown]
[im 9/83  bone]
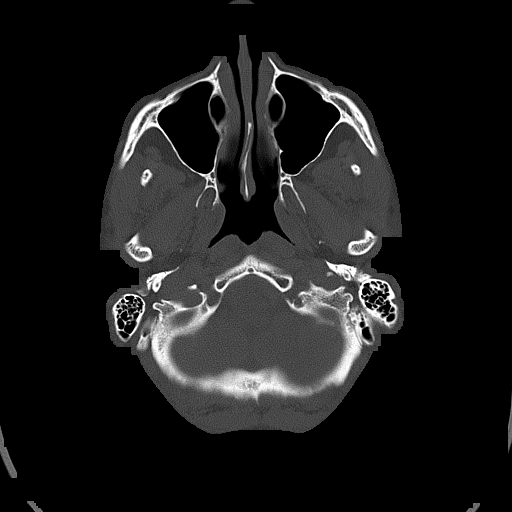
[im 17/83  bone]
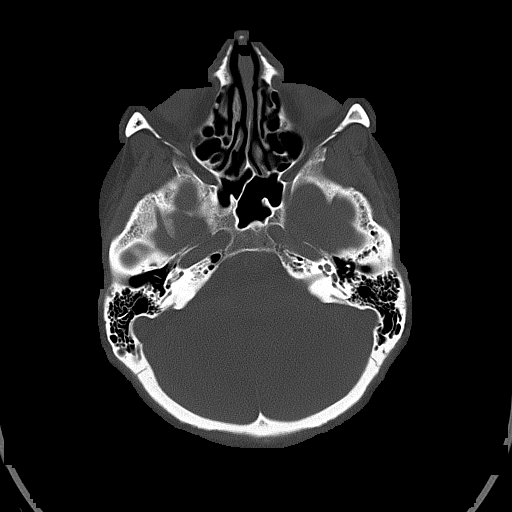
[im 25/83  bone]
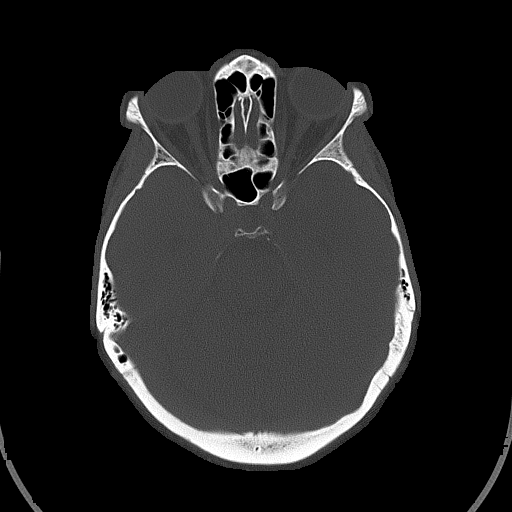

[Series 5: head without cor · coronal · non-contrast · 0.32mm/px · 3 of 67 slices shown]
[im 23/67  brain]
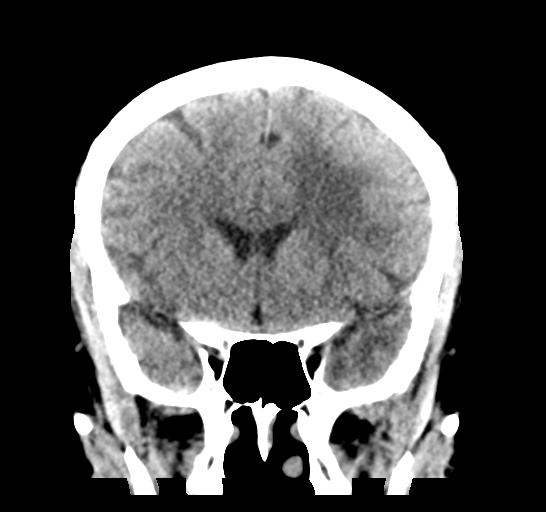
[im 30/67  brain]
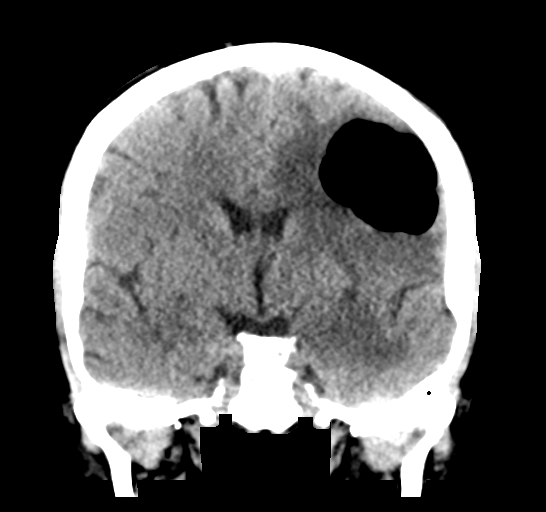
[im 37/67  brain]
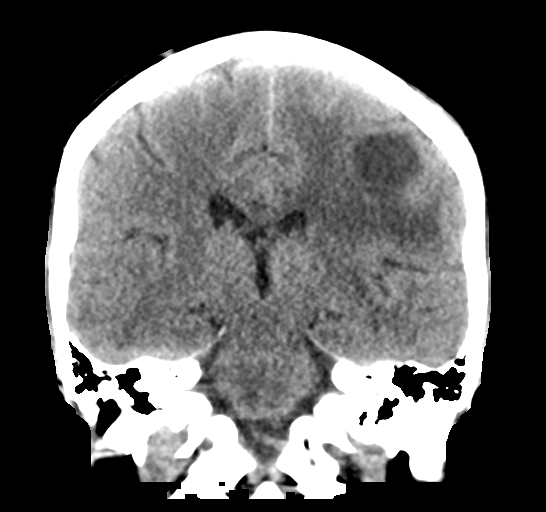

[Series 6: head without sag · sagittal · non-contrast · 0.32mm/px · 3 of 58 slices shown]
[im 20/58  brain]
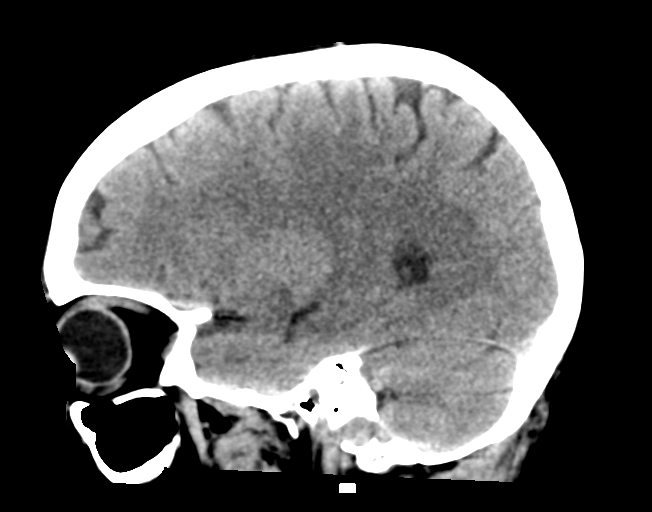
[im 29/58  brain]
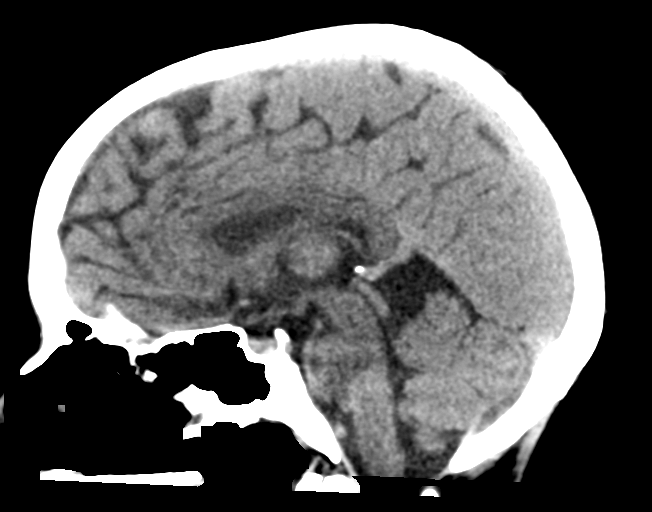
[im 39/58  brain]
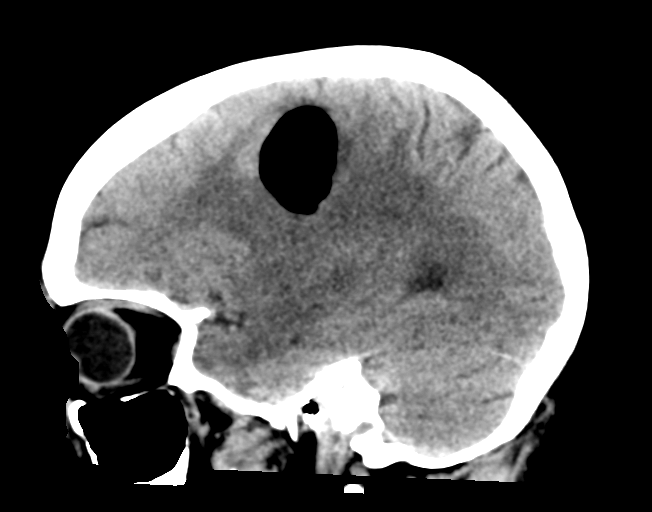

[16 of 47 positions shown; findings below may reference images not displayed]

FINDINGS: Brain: Gas accumulation within the left frontal lobe cavity with
unchanged regional white matter low-density and swelling. No
complicating hemorrhage. Mild local mass effect is stable. No
complicating infarct noted.

Vascular: No hyperdense vessel or unexpected calcification.

Skull: Left frontal burr holes.

Sinuses/Orbits: Negative
IMPRESSION: Interval surgery with gas filling of the left frontal cavity. No
complicating infarct or hemorrhage.

## 2021-07-04 MED ORDER — DIPHENHYDRAMINE HCL 25 MG PO CAPS
25.0000 mg | ORAL_CAPSULE | Freq: Four times a day (QID) | ORAL | Status: DC | PRN
  Administered 2021-07-04: 25 mg via ORAL
  Filled 2021-07-04: qty 1

## 2021-07-04 MED ORDER — DEXAMETHASONE 1 MG PO TABS: ORAL_TABLET | ORAL | 0 refills | Status: AC

## 2021-07-04 MED ORDER — VANCOMYCIN HCL 500 MG/100ML IV SOLN
500.0000 mg | Freq: Two times a day (BID) | INTRAVENOUS | Status: DC
  Filled 2021-07-04: qty 100

## 2021-07-04 MED ORDER — DIPHENHYDRAMINE HCL 25 MG PO CAPS: 25.0000 mg | ORAL_CAPSULE | Freq: Once | ORAL | Status: DC

## 2021-07-04 MED ORDER — HYDROCODONE-ACETAMINOPHEN 5-325 MG PO TABS: 1.0000 | ORAL_TABLET | ORAL | 0 refills | Status: DC | PRN

## 2021-07-04 MED ORDER — FAMOTIDINE 20 MG PO TABS: 20.0000 mg | ORAL_TABLET | Freq: Once | ORAL | Status: DC

## 2021-07-04 NOTE — Progress Notes (Signed)
Neurosurgery  ? ?Patient seen and examined.  Minimal headache, right sided strength improved, now 4+/5 RUE.  Speech unchanged.  CT shows decompression of cyst, with air replacing fluid.  No seizures.  Will dc home with Dex taper

## 2021-07-04 NOTE — Progress Notes (Signed)
Subjective: ?Patient reports that she is doing well. She reports an improvement in her right hand symptoms. Speech is unchanged per patient. No c/o headaches. Mild allergic reaction to vancomycin overnight. ? ?Objective: ?Vital signs in last 24 hours: ?Temp:  [97.5 ?F (36.4 ?C)-98.7 ?F (37.1 ?C)] 98.5 ?F (36.9 ?C) (04/06 0400) ?Pulse Rate:  [58-86] 61 (04/06 0700) ?Resp:  [10-19] 12 (04/06 0700) ?BP: (86-118)/(51-83) 103/65 (04/06 0700) ?SpO2:  [94 %-100 %] 99 % (04/06 0700) ? ?Intake/Output from previous day: ?04/05 0701 - 04/06 0700 ?In: 2039.1 [P.O.:600; I.V.:1439.1] ?Out: 800 [Urine:800] ?Intake/Output this shift: ?No intake/output data recorded. ? ?Physical Exam: Patient is awake, A/O X 4, conversant, and in good spirits. Eyes open spontaneously. They are in NAD and VSS. Doing well. Speech is slightly dysarthric with word finding difficulty. Mild left facial droop. MAEW with good strength that is symmetric bilaterally. Sensation to light touch is intact. PERLA, EOMI. Tongue protrusion symmetric. CNs grossly intact. Incision is well approximated with no drainage, erythema, or edema. ? ?Lab Results: ?Recent Labs  ?  07/02/21 ?1400  ?WBC 5.7  ?HGB 13.8  ?HCT 38.1  ?PLT 217  ? ?BMET ?Recent Labs  ?  07/02/21 ?1400  ?NA 141  ?K 4.6  ?CL 109  ?CO2 24  ?GLUCOSE 97  ?BUN 14  ?CREATININE 1.12*  ?CALCIUM 9.2  ? ? ?Studies/Results: ?CT HEAD WO CONTRAST ? ?Result Date: 07/04/2021 ?CLINICAL DATA:  Recent surgery EXAM: CT HEAD WITHOUT CONTRAST TECHNIQUE: Contiguous axial images were obtained from the base of the skull through the vertex without intravenous contrast. RADIATION DOSE REDUCTION: This exam was performed according to the departmental dose-optimization program which includes automated exposure control, adjustment of the mA and/or kV according to patient size and/or use of iterative reconstruction technique. COMPARISON:  Brain MRI 06/14/2021 FINDINGS: Brain: Gas accumulation within the left frontal lobe cavity with  unchanged regional white matter low-density and swelling. No complicating hemorrhage. Mild local mass effect is stable. No complicating infarct noted. Vascular: No hyperdense vessel or unexpected calcification. Skull: Left frontal burr holes. Sinuses/Orbits: Negative IMPRESSION: Interval surgery with gas filling of the left frontal cavity. No complicating infarct or hemorrhage. Electronically Signed   By: Jorje Guild M.D.   On: 07/04/2021 06:08   ? ?Assessment/Plan: ?Patient is post-op day 1 s/p stereotactic drainage of left frontal lobe cyst. She is recovering well and reports an improvement  in her preoperative symptoms except for the persistence of difficulty with speech. Per the patient's RN and the patient, she had a mild allergic reaction to her 2 doses of vancomycin overnight. The first dose was her most notable reaction which consisted of erythema and pruritus. Second dose administered with Benadryl and patient was able to tolerate better. This mornings dose of vancomycin was discontinued due to her allergic reactions and the likelihood that she was developing red man syndrome. Follow up MRI is stable without any complicating infarct or hemorrhage. Will plan for discharge this afternoon as long as she continues to do well and is stable.   ? ? ? LOS: 1 day  ? ? ? ?Marvis Moeller, DNP, NP-C ?07/04/2021, 8:01 AM ? ? ? ? ?

## 2021-07-04 NOTE — Discharge Summary (Signed)
Physician Discharge Summary  ?Patient ID: ?Candace [name] ?MRN: [id number] ?DOB/AGE: [birth year] 39 y.o. ? ?Admit date: 07/03/2021 ?Discharge date: 07/04/2021 ? ?Admission Diagnoses: Astrocytoma of frontal lobe ?Discharge Diagnoses: Astrocytoma of frontal lobe ? ?Principal Problem: ?  Astrocytoma brain tumor ([place]) ?Active Problems: ?  Frontal mass of brain ? ? ?Discharged Condition: good ? ?Hospital Course: The patient was admitted on 07/03/2021 and taken to the operating room where the patient underwent stereotactic drainage of left frontal lobe cyst . The patient tolerated the procedure well and was taken to the recovery room and then to the floor in stable condition. The hospital course was routine. There were no complications. The wound remained clean dry and intact. Pt had appropriate surgical soreness. No complaints of new neurological deficits. The patient remained afebrile with stable vital signs, and tolerated a regular diet. The patient continued to increase activities, and pain was well controlled with oral pain medications. ? ? ?Consults: None ? ?Significant Diagnostic Studies: radiology: MRI: brain ? ?Treatments: surgery: stereotactic drainage of left frontal lobe cyst, application of cranial navigation ? ?Discharge Exam: ?Blood pressure 104/69, pulse 73, temperature 98.6 ?F (37 ?C), temperature source Oral, resp. rate 15, last menstrual period 06/06/2021, SpO2 98 %. ?Physical Exam: Patient is awake, A/O X 4, conversant, and in good spirits. Eyes open spontaneously. They are in NAD and VSS. Doing well. Speech is slightly dysarthric with word finding difficulty. Mild left facial droop. MAEW with good strength. LUE 5/5, RUE 4+/5. Sensation to light touch is intact. PERLA, EOMI. Tongue protrusion symmetric. CNs grossly intact. Incision is well approximated with no drainage, erythema, or edema. ? ?Disposition: Discharge disposition: 01-Home or Self Care ? ? ? ? ? ? ? ?Allergies as of 07/04/2021   ? ?   Reactions  ?  Trazodone And Nefazodone Itching  ? Amitriptyline   ? Severe joint pain   ? Chlorhexidine Itching  ? Penicillins Rash  ? Tape Rash  ? Clear tape  ? Vancomycin Itching, Rash  ? ?  ? ?  ?Medication List  ?  ? ?TAKE these medications   ? ?dexamethasone 1 MG tablet ?Commonly known as: DECADRON ?Take 4 tablets (4 mg total) by mouth every 8 (eight) hours for 2 days, THEN 2 tablets (2 mg total) every 8 (eight) hours for 2 days, THEN 2 tablets (2 mg total) every 12 (twelve) hours for 2 days, THEN 1 tablet (1 mg total) every 12 (twelve) hours for 2 days, THEN 1 tablet (1 mg total) daily for 2 days. ?Start taking on: July 04, 2021 ?  ?FISH OIL PO ?Take 2 capsules by mouth in the morning, at noon, and at bedtime. ?  ?HYDROcodone-acetaminophen 5-325 MG tablet ?Commonly known as: NORCO/VICODIN ?Take 1 tablet by mouth every 4 (four) hours as needed for moderate pain. ?  ?ibuprofen 200 MG tablet ?Commonly known as: ADVIL ?Take 400 mg by mouth every 8 (eight) hours as needed for headache. ?  ?Lacosamide 150 MG Tabs ?Take 1 tablet (150 mg total) by mouth 2 (two) times daily. ?  ?levETIRAcetam 1000 MG tablet ?Commonly known as: KEPPRA ?TAKE 2 TABLETS(2000 MG) BY MOUTH TWICE DAILY ?  ?LORazepam 2 MG tablet ?Commonly known as: Ativan ?Take 1 tablet (2 mg total) by mouth every 8 (eight) hours. ?  ?LYCOPENE PO ?Take 1 capsule by mouth daily. ?  ?magnesium gluconate 500 MG tablet ?Commonly known as: MAGONATE ?Take 250 mg by mouth daily. ?  ?MELATONIN PO ?Take 1 tablet by mouth at bedtime. ?  ?  OVER THE COUNTER MEDICATION ?Take 1 capsule by mouth daily. [place] mushroom ?  ?OVER THE COUNTER MEDICATION ?Take 1 tablet by mouth daily. Berberine ?  ?OVER THE COUNTER MEDICATION ?Take 1 capsule by mouth daily. Frankincense ?  ?OVER THE COUNTER MEDICATION ?Take 1 tablet by mouth daily. Vit C Zinc Vit D ?  ?RESVERATROL PO ?Take 1 capsule by mouth at bedtime. ?  ?TURMERIC PO ?Take 1 tablet by mouth daily. ?  ? ?  ? ? ? ?Signed: ?Marvis Moeller,  DNP, NP-C ?07/04/2021, 4:05 PM ? ? ?

## 2021-07-08 ENCOUNTER — Telehealth: Payer: Self-pay | Admitting: Internal Medicine

## 2021-07-08 NOTE — Telephone Encounter (Signed)
Per 4/10 inbasket  spoke with patient about appointment.  Pr confirmed appointment  ?

## 2021-07-09 ENCOUNTER — Ambulatory Visit: Payer: BC Managed Care – PPO | Admitting: Internal Medicine

## 2021-07-09 ENCOUNTER — Inpatient Hospital Stay: Payer: BC Managed Care – PPO | Attending: Internal Medicine | Admitting: Internal Medicine

## 2021-07-09 DIAGNOSIS — C711 Malignant neoplasm of frontal lobe: Secondary | ICD-10-CM

## 2021-07-09 DIAGNOSIS — R569 Unspecified convulsions: Secondary | ICD-10-CM

## 2021-07-09 NOTE — Progress Notes (Signed)
I connected with Candace [name] on 07/09/21 at  9:30 AM EDT by telephone visit and verified that I am speaking with the correct person using two identifiers.  ?I discussed the limitations, risks, security and privacy concerns of performing an evaluation and management service by telemedicine and the availability of in-person appointments. I also discussed with the patient that there may be a patient responsible charge related to this service. The patient expressed understanding and agreed to proceed.  ?Other persons participating in the visit and their role in the encounter:  spouse  ?Patient's location:  Home  ?Provider's location:  Office  ?Chief Complaint:  Astrocytoma of frontal lobe ([place]) ? ?Focal seizure ([place]) ? ?History of Present Ilness: Candace [name] tolerated cyst aspiration well overall.  She felt greatly improved with regards to her right arm and hand weakness for ~12 hours after the procedure.  Over the next few days, her weakness returned, possibly even worse than prior.  Over past two days she has felt better, though not as strong as immediate post-op.  No frank seizures, she continues on decadron taper per Dr. Marcello Moores ?Observations: Language notable for expressive dysphasia at baseline ?Assessment and Plan: Astrocytoma of frontal lobe ([place]) ? ?Focal seizure ([place]) ? ?Clinically stable follow cyst aspiration on 07/04/21.  Motor function has fluctuated, improving in past 2 days.   ? ?We discussed slowing down decadron taper if weakness recurs again.  ? ?Pathology is still pending, cytology demonstrated only rare atypical cells.  ? ?We would like to see her in person if clinical status changes again.  If not, then we will plan to obtain another set of imaging in June. ? ?Follow Up Instructions: We ask that Candace [name] return to clinic in 2 months following next brain MRI, or sooner as needed. ? ?I discussed the assessment and treatment plan with the patient.  The patient was provided an  opportunity to ask questions and all were answered.  The patient agreed with the plan and demonstrated understanding of the instructions.   ? ?The patient was advised to call back or seek an in-person evaluation if the symptoms worsen or if the condition fails to improve as anticipated.  I provided 5-10 minutes of non-face-to-face time during this enocunter. ? ?Ventura Sellers, MD ? ? ?I provided 22 minutes of non face-to-face telephone visit time during this encounter, and > 50% was spent counseling as documented under my assessment & plan.  ? ?

## 2021-07-15 ENCOUNTER — Encounter: Payer: Self-pay | Admitting: Internal Medicine

## 2021-07-26 ENCOUNTER — Encounter: Payer: Self-pay | Admitting: Internal Medicine

## 2021-07-26 ENCOUNTER — Other Ambulatory Visit: Payer: Self-pay | Admitting: Internal Medicine

## 2021-07-26 MED ORDER — LACOSAMIDE 150 MG PO TABS
150.0000 mg | ORAL_TABLET | Freq: Two times a day (BID) | ORAL | 2 refills | Status: DC
Start: 2021-07-26 — End: 2021-07-29

## 2021-07-29 ENCOUNTER — Encounter: Payer: Self-pay | Admitting: Internal Medicine

## 2021-07-29 ENCOUNTER — Other Ambulatory Visit: Payer: Self-pay | Admitting: Internal Medicine

## 2021-07-29 MED ORDER — LACOSAMIDE 150 MG PO TABS
150.0000 mg | ORAL_TABLET | Freq: Two times a day (BID) | ORAL | 2 refills | Status: DC
Start: 2021-07-29 — End: 2021-11-25

## 2021-08-22 ENCOUNTER — Other Ambulatory Visit: Payer: Self-pay | Admitting: Radiation Therapy

## 2021-08-28 ENCOUNTER — Encounter: Payer: Self-pay | Admitting: Internal Medicine

## 2021-08-29 ENCOUNTER — Other Ambulatory Visit: Payer: Self-pay | Admitting: Internal Medicine

## 2021-08-29 MED ORDER — LEVETIRACETAM 1000 MG PO TABS
ORAL_TABLET | ORAL | 1 refills | Status: DC
Start: 2021-08-29 — End: 2021-11-25

## 2021-09-06 ENCOUNTER — Ambulatory Visit (HOSPITAL_COMMUNITY)
Admission: RE | Admit: 2021-09-06 | Discharge: 2021-09-06 | Disposition: A | Discharge status: Home / Self Care | Payer: BC Managed Care – PPO | Source: Ambulatory Visit | Attending: Internal Medicine | Admitting: Internal Medicine

## 2021-09-06 DIAGNOSIS — C711 Malignant neoplasm of frontal lobe: Secondary | ICD-10-CM | POA: Diagnosis present

## 2021-09-06 IMAGING — MR MR HEAD WO/W CM
13 series · 48 of 48 positions shown · IV contrast (gadavist)
Comparison: [DATE]

CLINICAL DATA: Brain/CNS neoplasm, assess treatment response;
history of frontal lobe grade 3 astrocytoma

EXAM:
MRI HEAD WITHOUT AND WITH CONTRAST
TECHNIQUE: Multiplanar, multiecho pulse sequences of the brain and surrounding
structures were obtained without and with intravenous contrast.
CONTRAST:  6mL GADAVIST GADOBUTROL 1 MMOL/ML IV SOLN

[Series 5: DWI · axial · 3.0mm · 1.36mm/px · z∈[-68,+90]mm · 7 of 107 slices shown (1 of 2)]
[im 1/107]
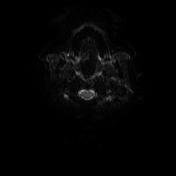
[im 18/107]
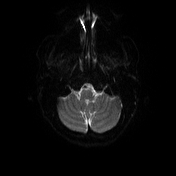
[im 36/107]
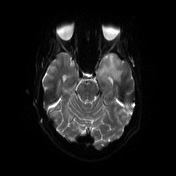
[im 54/107]
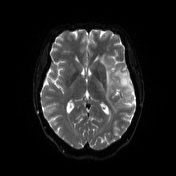
[im 71/107]
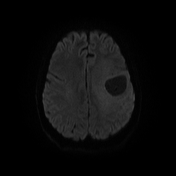
[im 89/107]
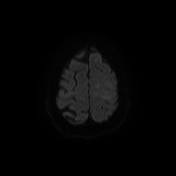
[im 107/107]
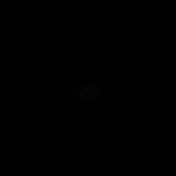

[Series 6: DWI · axial · 3.0mm · 1.36mm/px · z∈[-68,+90]mm · 3 of 53 slices shown (2 of 2)]
[im 1/53]
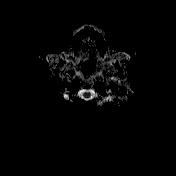
[im 27/53]
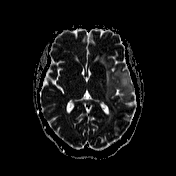
[im 53/53]
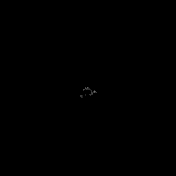

[Series 7: T1 · sagittal · 5.0mm · 0.75mm/px · 1 of 25 slices shown (1 of 2)]
[im 1/25]
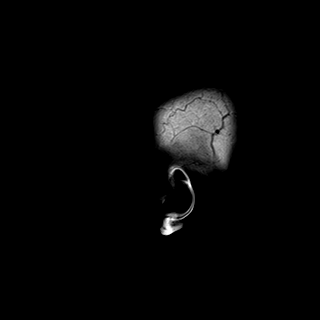

[Series 8: T2 · axial · 5.0mm · 0.62mm/px · 1 of 25 slices shown]
[im 1/25]
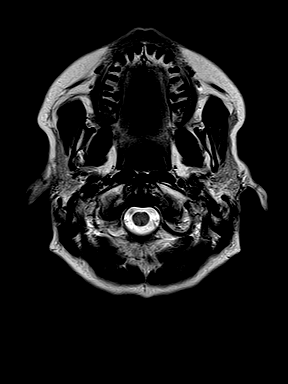

[Series 9: swi_images · axial · 3.0mm · 0.75mm/px · z∈[-64,+89]mm · 3 of 52 slices shown]
[im 1/52]
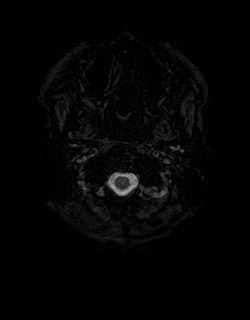
[im 26/52]
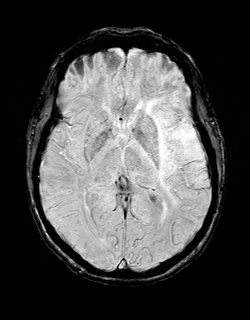
[im 52/52]
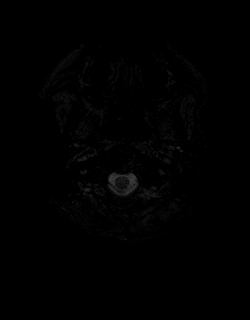

[Series 11: FLAIR · axial · 3.0mm · 0.75mm/px · z∈[-64,+89]mm · 3 of 52 slices shown]
[im 1/52]
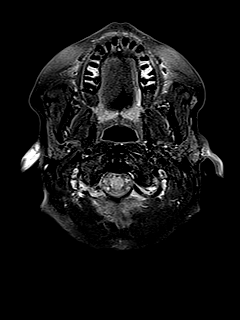
[im 26/52]
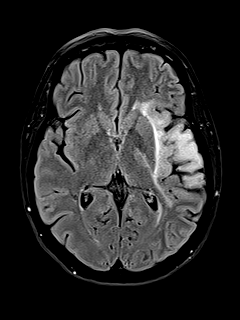
[im 52/52]
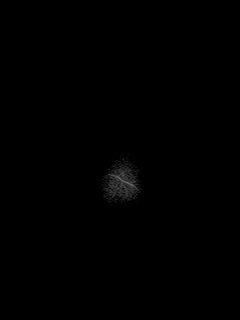

[Series 12: T1 · axial · 1.0mm · 0.94mm/px · z∈[-67,+92]mm · 10 of 160 slices shown (2 of 2)]
[im 1/160]
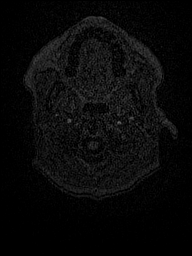
[im 18/160]
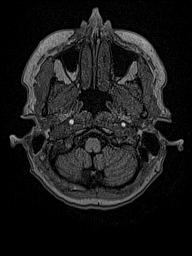
[im 36/160]
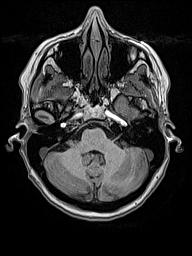
[im 54/160]
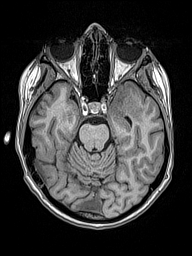
[im 71/160]
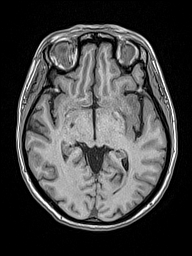
[im 89/160]
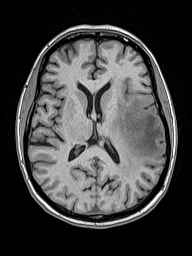
[im 107/160]
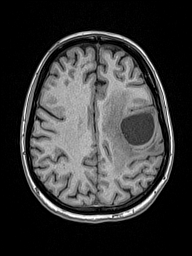
[im 124/160]
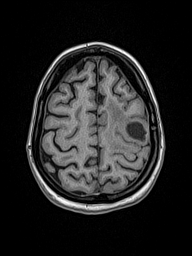
[im 142/160]
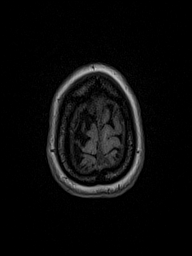
[im 160/160]
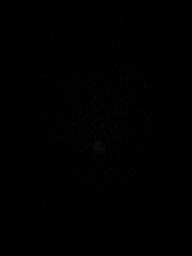

[Series 13: cor dwi_tracew · coronal · 5.0mm · 1.53mm/px · 3 of 58 slices shown]
[im 1/58]
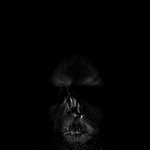
[im 29/58]
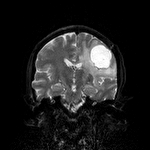
[im 58/58]
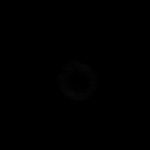

[Series 14: cor dwi_adc · coronal · 5.0mm · 1.53mm/px · 2 of 29 slices shown]
[im 1/29]
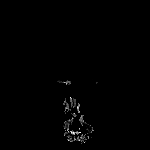
[im 29/29]
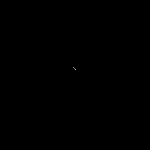

[Series 15: T2 post-contrast · coronal · 5.0mm · 0.57mm/px · 2 of 29 slices shown]
[im 1/29]
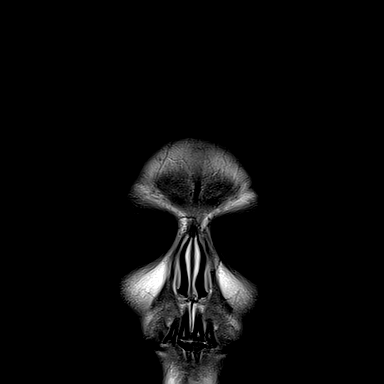
[im 29/29]
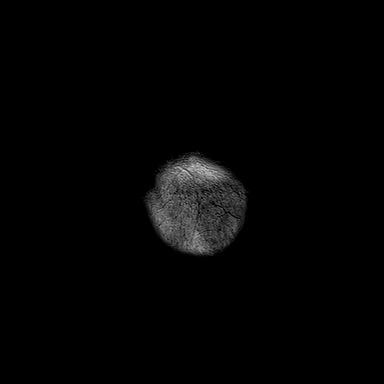

[Series 16: T1 post-contrast · axial · 1.0mm · 0.94mm/px · z∈[-67,+92]mm · 10 of 160 slices shown (1 of 3)]
[im 1/160]
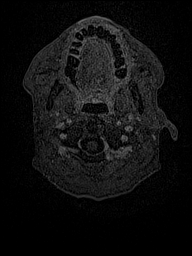
[im 18/160]
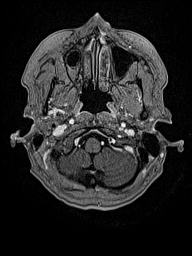
[im 36/160]
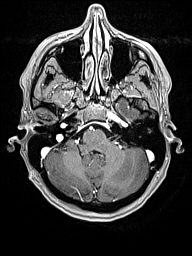
[im 54/160]
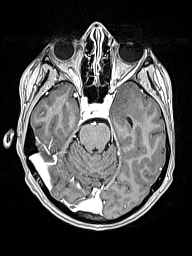
[im 71/160]
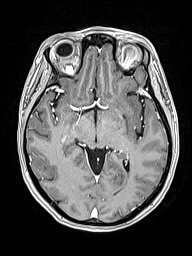
[im 89/160]
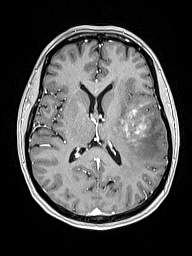
[im 107/160]
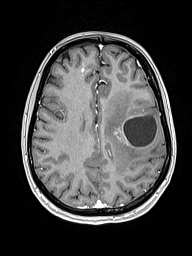
[im 124/160]
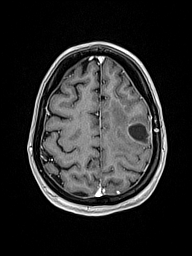
[im 142/160]
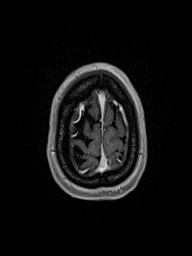
[im 160/160]
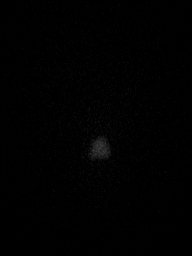

[Series 17: T1 post-contrast · coronal · 5.0mm · 0.43mm/px · 2 of 29 slices shown (2 of 3)]
[im 1/29]
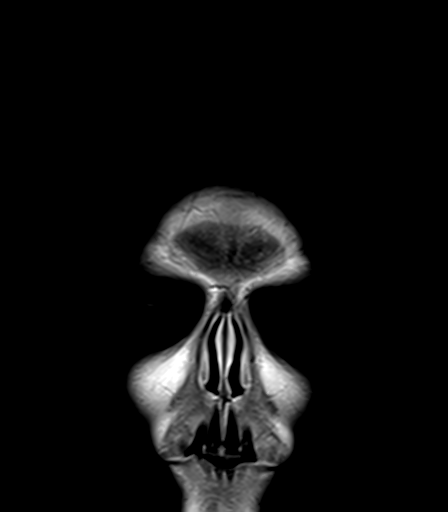
[im 29/29]
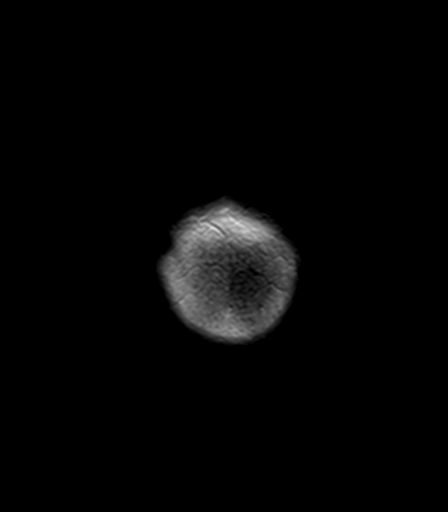

[Series 18: T1 post-contrast · sagittal · 5.0mm · 0.75mm/px · 1 of 25 slices shown (3 of 3)]
[im 1/25]
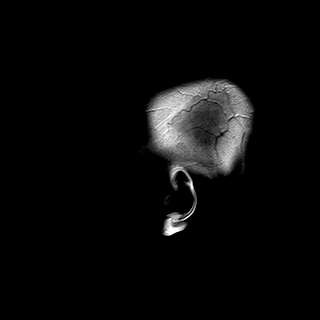

[48 of 48 positions shown; findings below may reference images not displayed]

FINDINGS: Brain: Cystic portion of the left frontal lesion has mildly
decreased in size. Adjacent ill-defined enhancement is again present
particularly at the deep and inferior margins and this is not
substantially changed. As before, this extends to the margin of the
body of the left lateral ventricle. There is no new abnormal
enhancement. Unchanged extent of surrounding T2 FLAIR hyperintensity
in the frontoparietal lobes with extension inferiorly to involve the
insula and temporal lobe.

Mild mass effect is not substantially changed. No acute infarction.
No hydrocephalus.

Vascular: Major vessel flow voids at the skull base are preserved.

Skull and upper cervical spine: Normal marrow signal is preserved.

Sinuses/Orbits: Paranasal sinuses are aerated. Orbits are
unremarkable.

Other: Sella is unremarkable.  Mastoid air cells are clear.
IMPRESSION: Decrease in size of cystic component of the left frontotemporal
mass. Ill-defined enhancement and extent of T2 FLAIR hyperintensity
are stable.

## 2021-09-06 MED ORDER — GADOBUTROL 1 MMOL/ML IV SOLN
6.0000 mL | Freq: Once | INTRAVENOUS | Status: AC | PRN
  Administered 2021-09-06: 6 mL via INTRAVENOUS

## 2021-09-09 ENCOUNTER — Telehealth: Payer: Self-pay | Admitting: Internal Medicine

## 2021-09-09 ENCOUNTER — Inpatient Hospital Stay: Payer: BC Managed Care – PPO

## 2021-09-09 ENCOUNTER — Other Ambulatory Visit: Payer: Self-pay

## 2021-09-09 ENCOUNTER — Inpatient Hospital Stay: Payer: BC Managed Care – PPO | Attending: Internal Medicine | Admitting: Internal Medicine

## 2021-09-09 VITALS — BP 112/83 | HR 73 | Temp 97.7°F | Resp 15 | Wt 142.2 lb

## 2021-09-09 DIAGNOSIS — R55 Syncope and collapse: Secondary | ICD-10-CM | POA: Insufficient documentation

## 2021-09-09 DIAGNOSIS — R200 Anesthesia of skin: Secondary | ICD-10-CM | POA: Diagnosis not present

## 2021-09-09 DIAGNOSIS — Z87440 Personal history of urinary (tract) infections: Secondary | ICD-10-CM | POA: Insufficient documentation

## 2021-09-09 DIAGNOSIS — C711 Malignant neoplasm of frontal lobe: Secondary | ICD-10-CM | POA: Insufficient documentation

## 2021-09-09 DIAGNOSIS — Z881 Allergy status to other antibiotic agents status: Secondary | ICD-10-CM | POA: Diagnosis not present

## 2021-09-09 DIAGNOSIS — Z880 Allergy status to penicillin: Secondary | ICD-10-CM | POA: Insufficient documentation

## 2021-09-09 DIAGNOSIS — Z888 Allergy status to other drugs, medicaments and biological substances status: Secondary | ICD-10-CM | POA: Insufficient documentation

## 2021-09-09 DIAGNOSIS — R569 Unspecified convulsions: Secondary | ICD-10-CM | POA: Diagnosis not present

## 2021-09-09 DIAGNOSIS — R4781 Slurred speech: Secondary | ICD-10-CM | POA: Diagnosis not present

## 2021-09-09 DIAGNOSIS — Z79899 Other long term (current) drug therapy: Secondary | ICD-10-CM | POA: Insufficient documentation

## 2021-09-09 NOTE — Telephone Encounter (Signed)
Per 6/12 los called and spoke to pt about appointment.  Pt confirmed appointment  

## 2021-09-09 NOTE — Progress Notes (Signed)
[facility] [place], [place] [address] [phone]   Interval Evaluation  Date of Service: 09/09/21 Patient Name: Candace [name] Patient MRN: [id number] Patient DOB: 09/04/1980 Provider: Ventura Sellers, MD  Identifying Statement:  Candace [name] is a 41 y.o. female with left temporal  grade 3 astrocytoma     Oncologic History: Oncology History  Astrocytoma of frontal lobe ([place])  12/08/2019 Surgery   Stereotactic R temporal biopsy by Dr. Marcello Moores.  Path demonstrates Astrocytoma, IDH-mt, WHO grade 3   01/17/2020 - 03/02/2020 Radiation Therapy   IMRT and concurrent Temodar with Dr. Isidore Moos   07/03/2021 Surgery   Left temporal cyst aspiration with Dr. Marcello Moores; path is rare atypical cells, inflammatory cells.     Biomarkers:  MGMT Unknown.  IDH 1/2 Mutated.  EGFR Unknown  TERT Unknown   Interval History:  Candace [name] presents today for follow up after recent MRI brain.  She did experience two brief seizure episodes immediately following the MRI study.  No major changes with right hand clumsiness, gait.  Her language impairments are also more or less stable.  Seizure frequency is otherwise decreased from prior to cyst aspiration in April.  Now off all steroids.  Decadron 12/29/20: 63m 02/02/21: 367m H+P (12/23/19) Patient presented to medical attention early in September with first ever seizure.  Event was described as "sudden onset left arm numbness" followed by "face twisting, and loss of consciousness for at least 30 minutes".  CNS imaging demonstrated a large left frontal non enhancing mass consistent with primary brain tumor.  She underwent biopsy on 12/08/19 with Dr. ThMarcello Moores Following surgery she has some slurring of speech but no other significant complaints, returned to prior baseline upon discharge.  Now home, she has had no breathrough events and no focal neurologic complaints.    Medications: Current  Outpatient Medications on File Prior to Visit  Medication Sig Dispense Refill   HYDROcodone-acetaminophen (NORCO/VICODIN) 5-325 MG tablet Take 1 tablet by mouth every 4 (four) hours as needed for moderate pain. 20 tablet 0   ibuprofen (ADVIL) 200 MG tablet Take 400 mg by mouth every 8 (eight) hours as needed for headache.     Lacosamide 150 MG TABS Take 1 tablet (150 mg total) by mouth 2 (two) times daily. 120 tablet 2   levETIRAcetam (KEPPRA) 1000 MG tablet TAKE 2 TABLETS(2000 MG) BY MOUTH TWICE DAILY 180 tablet 1   LORazepam (ATIVAN) 2 MG tablet Take 1 tablet (2 mg total) by mouth every 8 (eight) hours. (Patient not taking: Reported on 06/28/2021) 30 tablet 0   LYCOPENE PO Take 1 capsule by mouth daily.     magnesium gluconate (MAGONATE) 500 MG tablet Take 250 mg by mouth daily.     MELATONIN PO Take 1 tablet by mouth at bedtime.     Omega-3 Fatty Acids (FISH OIL PO) Take 2 capsules by mouth in the morning, at noon, and at bedtime.     OVER THE COUNTER MEDICATION Take 1 capsule by mouth daily. Maitake mushroom     OVER THE COUNTER MEDICATION Take 1 tablet by mouth daily. Berberine     OVER THE COUNTER MEDICATION Take 1 capsule by mouth daily. Frankincense     OVER THE COUNTER MEDICATION Take 1 tablet by mouth daily. Vit C Zinc Vit D     RESVERATROL PO Take 1 capsule by mouth at bedtime.     TURMERIC PO Take 1 tablet by mouth daily.  No current facility-administered medications on file prior to visit.    Allergies:  Allergies  Allergen Reactions   Trazodone And Nefazodone Itching   Amitriptyline     Severe joint pain    Chlorhexidine Itching   Penicillins Rash   Tape Rash    Clear tape   Vancomycin Itching and Rash   Past Medical History:  Past Medical History:  Diagnosis Date   Brain tumor [facility])    Complication of anesthesia 2005   trouble waking   Headache    Seizure ([place]) 2020   UTI (urinary tract infection)    Past Surgical History:  Past Surgical History:   Procedure Laterality Date   ABDOMINAL SURGERY     APPLICATION OF CRANIAL NAVIGATION Left 12/08/2019   Procedure: APPLICATION OF CRANIAL NAVIGATION;  Surgeon: Vallarie Mare, MD;  Location: [facility];  Service: Neurosurgery;  Laterality: Left;  APPLICATION OF CRANIAL NAVIGATION   APPLICATION OF CRANIAL NAVIGATION Left 07/03/2021   Procedure: APPLICATION OF CRANIAL NAVIGATION;  Surgeon: Vallarie Mare, MD;  Location: [place];  Service: Neurosurgery;  Laterality: Left;   CRANIOTOMY Left 07/03/2021   Procedure: STEREOTACTIC DRAINAGE OF LEFT FRONTAL LOBE CYST;  Surgeon: Vallarie Mare, MD;  Location: [place];  Service: Neurosurgery;  Laterality: Left;   FRAMELESS  BIOPSY WITH BRAINLAB Left 12/08/2019   Procedure: Left stereotactic brain biopsy with brainlab;  Surgeon: Vallarie Mare, MD;  Location: [place];  Service: Neurosurgery;  Laterality: Left;  Left stereotactic brain biopsy with brainlab   WISDOM TOOTH EXTRACTION     Social History:  Social History   Socioeconomic History   Marital status: Married    Spouse name: Fara Olden   Number of children: Not on file   Years of education: Not on file   Highest education level: Not on file  Occupational History   Not on file  Tobacco Use   Smoking status: Never   Smokeless tobacco: Never  Vaping Use   Vaping Use: Never used  Substance and Sexual Activity   Alcohol use: Yes    Comment: social   Drug use: Yes    Types: Marijuana   Sexual activity: Yes  Other Topics Concern   Not on file  Social History Narrative   Not on file   Social Determinants of Health   Financial Resource Strain: Not on file  Food Insecurity: Not on file  Transportation Needs: Not on file  Physical Activity: Not on file  Stress: Not on file  Social Connections: Not on file  Intimate Partner Violence: Not on file   Family History:  Family History  Problem Relation Age of Onset   Migraines Mother    Thyroid cancer Father    Testicular cancer Father      Review of Systems: Constitutional: Doesn't report fevers, chills or abnormal weight loss Eyes: Doesn't report blurriness of vision Ears, nose, mouth, throat, and face: Doesn't report sore throat Respiratory: Doesn't report cough, dyspnea or wheezes Cardiovascular: Doesn't report palpitation, chest discomfort  Gastrointestinal:  Doesn't report nausea, constipation, diarrhea GU: Doesn't report incontinence Skin: Doesn't report skin rashes Neurological: Per HPI Musculoskeletal: Doesn't report joint pain Behavioral/Psych: +anxiety  Physical Exam: Wt Readings from Last 3 Encounters:  07/02/21 144 lb 3.2 oz (65.4 kg)  06/17/21 144 lb 1.6 oz (65.4 kg)  03/21/21 149 lb 9.6 oz (67.9 kg)   Temp Readings from Last 3 Encounters:  07/04/21 98.6 F (37 C) (Oral)  07/02/21 98.7 F (37.1 C) (Oral)  06/30/21 98.1 F (36.7 C) (Oral)   BP Readings from Last 3 Encounters:  07/04/21 104/69  07/02/21 108/72  06/30/21 125/80   Pulse Readings from Last 3 Encounters:  07/04/21 73  07/02/21 65  06/30/21 81    KPS: 70. General: Alert, cooperative, pleasant, in no acute distress Head: Normal EENT: No conjunctival injection or scleral icterus.  Lungs: Resp effort normal Cardiac: Regular rate Abdomen: Non-distended abdomen Skin: No rashes cyanosis or petechiae. Extremities: No clubbing or edema  Neurologic Exam: Mental Status: Awake, alert, attentive to examiner. Oriented to self and environment. Expressive dysphasia Cranial Nerves: Visual acuity is grossly normal. Visual fields are full. Extra-ocular movements intact. No ptosis. Face is symmetric Motor: Tone and bulk are normal. Right arm 5/5 with subtly impaired fine motor coordination, R leg 4+/5. Reflexes are symmetric, no pathologic reflexes present.  Sensory: Intact to light touch Gait: Hemiparetic   Labs: I have reviewed the data as listed    Component Value Date/Time   NA 141 07/02/2021 1400   K 4.6 07/02/2021 1400   CL  109 07/02/2021 1400   CO2 24 07/02/2021 1400   GLUCOSE 97 07/02/2021 1400   BUN 14 07/02/2021 1400   CREATININE 1.12 (H) 07/02/2021 1400   CREATININE 0.99 08/20/2020 1205   CALCIUM 9.2 07/02/2021 1400   PROT 6.5 07/02/2021 1400   ALBUMIN 3.8 07/02/2021 1400   AST 19 07/02/2021 1400   AST 14 (L) 08/20/2020 1205   ALT 17 07/02/2021 1400   ALT 16 08/20/2020 1205   ALKPHOS 52 07/02/2021 1400   BILITOT 0.4 07/02/2021 1400   BILITOT 0.6 08/20/2020 1205   GFRNONAA >60 07/02/2021 1400   GFRNONAA >60 08/20/2020 1205   GFRAA >60 12/09/2019 0623   Lab Results  Component Value Date   WBC 5.7 07/02/2021   NEUTROABS 5.3 10/28/2020   HGB 13.8 07/02/2021   HCT 38.1 07/02/2021   MCV 91.1 07/02/2021   PLT 217 07/02/2021   Imaging:  [facility] Clinician Interpretation: I have personally reviewed the CNS images as listed.  My interpretation, in the context of the patient's clinical presentation, is stable disease  MR BRAIN W WO CONTRAST  Result Date: 09/06/2021 CLINICAL DATA:  Brain/CNS neoplasm, assess treatment response; history of frontal lobe grade 3 astrocytoma EXAM: MRI HEAD WITHOUT AND WITH CONTRAST TECHNIQUE: Multiplanar, multiecho pulse sequences of the brain and surrounding structures were obtained without and with intravenous contrast. CONTRAST:  77m GADAVIST GADOBUTROL 1 MMOL/ML IV SOLN COMPARISON:  06/14/2021 FINDINGS: Brain: Cystic portion of the left frontal lesion has mildly decreased in size. Adjacent ill-defined enhancement is again present particularly at the deep and inferior margins and this is not substantially changed. As before, this extends to the margin of the body of the left lateral ventricle. There is no new abnormal enhancement. Unchanged extent of surrounding T2 FLAIR hyperintensity in the frontoparietal lobes with extension inferiorly to involve the insula and temporal lobe. Mild mass effect is not substantially changed. No acute infarction. No hydrocephalus. Vascular: Major  vessel flow voids at the skull base are preserved. Skull and upper cervical spine: Normal marrow signal is preserved. Sinuses/Orbits: Paranasal sinuses are aerated. Orbits are unremarkable. Other: Sella is unremarkable.  Mastoid air cells are clear. IMPRESSION: Decrease in size of cystic component of the left frontotemporal mass. Ill-defined enhancement and extent of T2 FLAIR hyperintensity are stable. Electronically Signed   By: PMacy MisM.D.   On: 09/06/2021 12:46     Assessment/Plan Astrocytoma of frontal lobe (  [place]) [C71.1]  Jenilyn Magana is clinically stable or improved today. Brain MRI demonstrates stable findings.  Recent breakthrough seizures may be attributed to circumstances or provocation from MRI study.  She may dose ativan 35m prior to next MRI if desired.  We recommended continuing Keppra 20052mBID, Vimpat 15071mID for now.  We ask that JesEthne Jeonturn to clinic in 4 months following next brain MRI, or sooner as needed.  All questions were answered. The patient knows to call the clinic with any problems, questions or concerns. No barriers to learning were detected.  The total time spent in the encounter was 30 minutes and more than 50% was on counseling and review of test results   ZacVentura SellersD Medical Director of Neuro-Oncology [facility] [place]/12/23 9:37 AM

## 2021-09-11 ENCOUNTER — Other Ambulatory Visit: Payer: Self-pay | Admitting: Radiation Therapy

## 2021-10-07 ENCOUNTER — Encounter: Payer: Self-pay | Admitting: Internal Medicine

## 2021-10-21 ENCOUNTER — Other Ambulatory Visit: Payer: Self-pay

## 2021-10-22 ENCOUNTER — Other Ambulatory Visit: Payer: Self-pay

## 2021-10-29 ENCOUNTER — Other Ambulatory Visit: Payer: Self-pay

## 2021-10-31 ENCOUNTER — Other Ambulatory Visit: Payer: Self-pay

## 2021-11-04 ENCOUNTER — Encounter: Payer: Self-pay | Admitting: Internal Medicine

## 2021-11-17 ENCOUNTER — Encounter: Payer: Self-pay | Admitting: Internal Medicine

## 2021-11-18 ENCOUNTER — Telehealth: Payer: BC Managed Care – PPO | Admitting: Internal Medicine

## 2021-11-19 ENCOUNTER — Telehealth: Payer: BC Managed Care – PPO | Admitting: Internal Medicine

## 2021-11-20 ENCOUNTER — Other Ambulatory Visit: Payer: Self-pay

## 2021-11-23 ENCOUNTER — Encounter: Payer: Self-pay | Admitting: Internal Medicine

## 2021-11-25 ENCOUNTER — Encounter: Payer: Self-pay | Admitting: Internal Medicine

## 2021-11-25 MED ORDER — LACOSAMIDE 150 MG PO TABS: 150.0000 mg | ORAL_TABLET | Freq: Two times a day (BID) | ORAL | 2 refills | Status: DC

## 2021-11-25 MED ORDER — LEVETIRACETAM 1000 MG PO TABS: ORAL_TABLET | ORAL | 1 refills | Status: DC

## 2021-11-25 NOTE — Addendum Note (Signed)
Addended by: Ventura Sellers on: 11/25/2021 11:41 AM   Modules accepted: Orders

## 2021-12-19 ENCOUNTER — Other Ambulatory Visit: Payer: Self-pay

## 2022-01-06 ENCOUNTER — Ambulatory Visit: Payer: BC Managed Care – PPO | Admitting: Internal Medicine

## 2022-01-10 ENCOUNTER — Other Ambulatory Visit: Payer: Self-pay | Admitting: Internal Medicine

## 2022-01-10 ENCOUNTER — Ambulatory Visit (HOSPITAL_COMMUNITY)
Admission: RE | Admit: 2022-01-10 | Discharge: 2022-01-10 | Disposition: A | Discharge status: Home / Self Care | Payer: BC Managed Care – PPO | Source: Ambulatory Visit | Attending: Internal Medicine | Admitting: Internal Medicine

## 2022-01-10 DIAGNOSIS — C711 Malignant neoplasm of frontal lobe: Secondary | ICD-10-CM

## 2022-01-13 ENCOUNTER — Inpatient Hospital Stay: Payer: BC Managed Care – PPO | Attending: Internal Medicine | Admitting: Internal Medicine

## 2022-01-13 ENCOUNTER — Inpatient Hospital Stay: Payer: BC Managed Care – PPO

## 2022-01-13 VITALS — BP 125/85 | HR 68 | Temp 97.9°F | Resp 18 | Wt 136.0 lb

## 2022-01-13 DIAGNOSIS — Z808 Family history of malignant neoplasm of other organs or systems: Secondary | ICD-10-CM | POA: Insufficient documentation

## 2022-01-13 DIAGNOSIS — R569 Unspecified convulsions: Secondary | ICD-10-CM | POA: Diagnosis not present

## 2022-01-13 DIAGNOSIS — Z79899 Other long term (current) drug therapy: Secondary | ICD-10-CM | POA: Diagnosis not present

## 2022-01-13 DIAGNOSIS — C711 Malignant neoplasm of frontal lobe: Secondary | ICD-10-CM | POA: Insufficient documentation

## 2022-01-13 DIAGNOSIS — F1290 Cannabis use, unspecified, uncomplicated: Secondary | ICD-10-CM | POA: Insufficient documentation

## 2022-01-13 DIAGNOSIS — Z880 Allergy status to penicillin: Secondary | ICD-10-CM | POA: Diagnosis not present

## 2022-01-13 DIAGNOSIS — R4781 Slurred speech: Secondary | ICD-10-CM | POA: Diagnosis not present

## 2022-01-13 DIAGNOSIS — Z87440 Personal history of urinary (tract) infections: Secondary | ICD-10-CM | POA: Diagnosis not present

## 2022-01-13 DIAGNOSIS — Z881 Allergy status to other antibiotic agents status: Secondary | ICD-10-CM | POA: Diagnosis not present

## 2022-01-13 DIAGNOSIS — R200 Anesthesia of skin: Secondary | ICD-10-CM | POA: Insufficient documentation

## 2022-01-13 DIAGNOSIS — Z8249 Family history of ischemic heart disease and other diseases of the circulatory system: Secondary | ICD-10-CM | POA: Insufficient documentation

## 2022-01-13 DIAGNOSIS — Z8043 Family history of malignant neoplasm of testis: Secondary | ICD-10-CM | POA: Insufficient documentation

## 2022-01-13 DIAGNOSIS — Z888 Allergy status to other drugs, medicaments and biological substances status: Secondary | ICD-10-CM | POA: Insufficient documentation

## 2022-01-13 DIAGNOSIS — R55 Syncope and collapse: Secondary | ICD-10-CM | POA: Insufficient documentation

## 2022-01-13 NOTE — Progress Notes (Signed)
[facility] at [place], [place] [address] [phone]   Interval Evaluation  Date of Service: 01/13/22 Patient Name: Candace [name] Patient MRN: [id number] Patient DOB: 13-Nov-1980 Provider: Ventura Sellers, MD  Identifying Statement:  Candace [name] is a 41 y.o. female with left temporal  grade 3 astrocytoma     Oncologic History: Oncology History  Astrocytoma of frontal lobe ([place])  12/08/2019 Surgery   Stereotactic R temporal biopsy by Dr. Marcello Moores.  Path demonstrates Astrocytoma, IDH-mt, WHO grade 3   01/17/2020 - 03/02/2020 Radiation Therapy   IMRT and concurrent Temodar with Dr. Isidore Moos   07/03/2021 Surgery   Left temporal cyst aspiration with Dr. Marcello Moores; path is rare atypical cells, inflammatory cells.     Biomarkers:  MGMT Unknown.  IDH 1/2 Mutated.  EGFR Unknown  TERT Unknown   Interval History:  [name] presents today for follow up after recent MRI brain.  She and her husband do describe some decline in expressive language and right hand fine motor function.  Interval of  change is ~2 weeks.  This past week she also had a seizure cluster, before MRI scan.  Continues on Keppra 2056m twice per day and Vimpat 1540mtwice per day.  Remains off steroids.  Decadron 12/29/20: 1m31m1/05/22: 3mg47m+P (12/23/19) Patient presented to medical attention early in September with first ever seizure.  Event was described as "sudden onset left arm numbness" followed by "face twisting, and loss of consciousness for at least 30 minutes".  CNS imaging demonstrated a large left frontal non enhancing mass consistent with primary brain tumor.  She underwent biopsy on 12/08/19 with Dr. ThomMarcello Mooresollowing surgery she has some slurring of speech but no other significant complaints, returned to prior baseline upon discharge.  Now home, she has had no breathrough events and no focal neurologic complaints.    Medications: Current  Outpatient Medications on File Prior to Visit  Medication Sig Dispense Refill   Lacosamide 150 MG TABS Take 1 tablet (150 mg total) by mouth 2 (two) times daily. 120 tablet 2   levETIRAcetam (KEPPRA) 1000 MG tablet TAKE 2 TABLETS(2000 MG) BY MOUTH TWICE DAILY 180 tablet 1   LORazepam (ATIVAN) 2 MG tablet Take 1 tablet (2 mg total) by mouth every 8 (eight) hours. (Patient not taking: Reported on 06/28/2021) 30 tablet 0   LYCOPENE PO Take 1 capsule by mouth daily.     magnesium gluconate (MAGONATE) 500 MG tablet Take 250 mg by mouth daily.     MELATONIN PO Take 1 tablet by mouth at bedtime.     Omega-3 Fatty Acids (FISH OIL PO) Take 2 capsules by mouth in the morning, at noon, and at bedtime.     OVER THE COUNTER MEDICATION Take 1 capsule by mouth daily. Maitake mushroom     OVER THE COUNTER MEDICATION Take 1 tablet by mouth daily. Berberine     OVER THE COUNTER MEDICATION Take 1 capsule by mouth daily. Frankincense     OVER THE COUNTER MEDICATION Take 1 tablet by mouth daily. Vit C Zinc Vit D     RESVERATROL PO Take 1 capsule by mouth at bedtime.     TURMERIC PO Take 1 tablet by mouth daily.     No current facility-administered medications on file prior to visit.    Allergies:  Allergies  Allergen Reactions   Trazodone And Nefazodone Itching   Amitriptyline     Severe joint pain  Chlorhexidine Itching   Penicillins Rash   Tape Rash    Clear tape   Vancomycin Itching and Rash   Past Medical History:  Past Medical History:  Diagnosis Date   Brain tumor [facility])    Complication of anesthesia 2005   trouble waking   Headache    Seizure ([place]) 2020   UTI (urinary tract infection)    Past Surgical History:  Past Surgical History:  Procedure Laterality Date   ABDOMINAL SURGERY     APPLICATION OF CRANIAL NAVIGATION Left 12/08/2019   Procedure: APPLICATION OF CRANIAL NAVIGATION;  Surgeon: Vallarie Mare, MD;  Location: [place];  Service: Neurosurgery;  Laterality: Left;  APPLICATION  OF CRANIAL NAVIGATION   APPLICATION OF CRANIAL NAVIGATION Left 07/03/2021   Procedure: APPLICATION OF CRANIAL NAVIGATION;  Surgeon: Vallarie Mare, MD;  Location: [place];  Service: Neurosurgery;  Laterality: Left;   CRANIOTOMY Left 07/03/2021   Procedure: STEREOTACTIC DRAINAGE OF LEFT FRONTAL LOBE CYST;  Surgeon: Vallarie Mare, MD;  Location: [place];  Service: Neurosurgery;  Laterality: Left;   FRAMELESS  BIOPSY WITH BRAINLAB Left 12/08/2019   Procedure: Left stereotactic brain biopsy with brainlab;  Surgeon: Vallarie Mare, MD;  Location: [place];  Service: Neurosurgery;  Laterality: Left;  Left stereotactic brain biopsy with brainlab   WISDOM TOOTH EXTRACTION     Social History:  Social History   Socioeconomic History   Marital status: Married    Spouse name: Fara Olden   Number of children: Not on file   Years of education: Not on file   Highest education level: Not on file  Occupational History   Not on file  Tobacco Use   Smoking status: Never   Smokeless tobacco: Never  Vaping Use   Vaping Use: Never used  Substance and Sexual Activity   Alcohol use: Yes    Comment: social   Drug use: Yes    Types: Marijuana   Sexual activity: Yes  Other Topics Concern   Not on file  Social History Narrative   Not on file   Social Determinants of Health   Financial Resource Strain: Not on file  Food Insecurity: Not on file  Transportation Needs: Not on file  Physical Activity: Not on file  Stress: Not on file  Social Connections: Not on file  Intimate Partner Violence: Not on file   Family History:  Family History  Problem Relation Age of Onset   Migraines Mother    Thyroid cancer Father    Testicular cancer Father     Review of Systems: Constitutional: Doesn't report fevers, chills or abnormal weight loss Eyes: Doesn't report blurriness of vision Ears, nose, mouth, throat, and face: Doesn't report sore throat Respiratory: Doesn't report cough, dyspnea or  wheezes Cardiovascular: Doesn't report palpitation, chest discomfort  Gastrointestinal:  Doesn't report nausea, constipation, diarrhea GU: Doesn't report incontinence Skin: Doesn't report skin rashes Neurological: Per HPI Musculoskeletal: Doesn't report joint pain Behavioral/Psych: +anxiety  Physical Exam: Wt Readings from Last 3 Encounters:  09/09/21 142 lb 3.2 oz (64.5 kg)  07/02/21 144 lb 3.2 oz (65.4 kg)  06/17/21 144 lb 1.6 oz (65.4 kg)   Temp Readings from Last 3 Encounters:  09/09/21 97.7 F (36.5 C) (Temporal)  07/04/21 98.6 F (37 C) (Oral)  07/02/21 98.7 F (37.1 C) (Oral)   BP Readings from Last 3 Encounters:  09/09/21 112/83  07/04/21 104/69  07/02/21 108/72   Pulse Readings from Last 3 Encounters:  09/09/21 73  07/04/21 73  07/02/21 65    KPS: 70. General: Alert, cooperative, pleasant, in no acute distress Head: Normal EENT: No conjunctival injection or scleral icterus.  Lungs: Resp effort normal Cardiac: Regular rate Abdomen: Non-distended abdomen Skin: No rashes cyanosis or petechiae. Extremities: No clubbing or edema  Neurologic Exam: Mental Status: Awake, alert, attentive to examiner. Oriented to self and environment. Expressive dysphasia Cranial Nerves: Visual acuity is grossly normal. Visual fields are full. Extra-ocular movements intact. No ptosis. Face is symmetric Motor: Tone and bulk are normal. Right arm 5/5 with subtly impaired fine motor coordination, R leg 4+/5. Reflexes are symmetric, no pathologic reflexes present.  Sensory: Intact to light touch Gait: Hemiparetic   Labs: I have reviewed the data as listed    Component Value Date/Time   NA 141 07/02/2021 1400   K 4.6 07/02/2021 1400   CL 109 07/02/2021 1400   CO2 24 07/02/2021 1400   GLUCOSE 97 07/02/2021 1400   BUN 14 07/02/2021 1400   CREATININE 1.12 (H) 07/02/2021 1400   CREATININE 0.99 08/20/2020 1205   CALCIUM 9.2 07/02/2021 1400   PROT 6.5 07/02/2021 1400   ALBUMIN  3.8 07/02/2021 1400   AST 19 07/02/2021 1400   AST 14 (L) 08/20/2020 1205   ALT 17 07/02/2021 1400   ALT 16 08/20/2020 1205   ALKPHOS 52 07/02/2021 1400   BILITOT 0.4 07/02/2021 1400   BILITOT 0.6 08/20/2020 1205   GFRNONAA >60 07/02/2021 1400   GFRNONAA >60 08/20/2020 1205   GFRAA >60 12/09/2019 0623   Lab Results  Component Value Date   WBC 5.7 07/02/2021   NEUTROABS 5.3 10/28/2020   HGB 13.8 07/02/2021   HCT 38.1 07/02/2021   MCV 91.1 07/02/2021   PLT 217 07/02/2021   Imaging:  [facility] Clinician Interpretation: I have personally reviewed the CNS images as listed.  My interpretation, in the context of the patient's clinical presentation, is  progression of cyst volume, no change in T2/FLAIR, contrast unavailable.  MR BRAIN WO CONTRAST  Result Date: 01/12/2022 CLINICAL DATA:  Brain/CNS neoplasm, assess treatment response. Brain/CNS neoplasm, assess treatment response, Astrocytoma of frontal lobe. EXAM: MRI HEAD WITHOUT CONTRAST TECHNIQUE: Multiplanar, multiecho pulse sequences of the brain and surrounding structures were obtained without intravenous contrast. Originally ordered as w/wo. Pt refused contrast. COMPARISON:  None Available. FINDINGS: Brain: Centrally cystic/necrotic mass of the left frontal lobe now measures 3.9 cm in transverse dimension, previously 3.4 cm. The degree of surrounding hyperintense T2-weighted signal is unchanged. This again extends into the left insula and temporal lobe. No contrast was administered on today's examination. Unchanged petechial hemorrhage within the mass. The remainder of the brain is unremarkable. No hydrocephalus or midline shift. Midline structures are normal. Vascular: Major flow voids are preserved. Skull and upper cervical spine: Normal calvarium and skull base. Visualized upper cervical spine and soft tissues are normal. Sinuses/Orbits:No paranasal sinus fluid levels or advanced mucosal thickening. No mastoid or middle ear effusion. Normal  orbits. IMPRESSION: Slight increase in size of centrally cystic/necrotic mass of the left frontal lobe. The degree of surrounding hyperintense T2-weighted signal is unchanged. Electronically Signed   By: Ulyses Jarred M.D.   On: 01/12/2022 00:34     Assessment/Plan Astrocytoma of frontal lobe ([place]) [C71.1]  Kewanna Kasprzak presents today with modest clinical progression which is focal.  Brain MRI demonstrates increase in volume of cystic component.  No change in T2/FLAIR signal abnormality; no contrast was administered for this study due to patient request.  There is likely an association  with structural changes on imaging and current clinical subjective/objective findings.  There is no explicit evidence of neoplastic progression at this time.  We re-introduced discussion regarding ommaya drain placement or shunt.  She will defer for now, but would consider if decline in function continues.  She did have "several good months" after cyst aspiration earlier in the year.  Will touch base with Dr. Marcello Moores, she has upcoming appt scheduled in November.  We otherwise recommended continuing Keppra 2073m BID, Vimpat 1517mBID for seizure prevention.  We ask that JeSharetha Newsoneturn to clinic in 2 months following next brain MRI, or sooner as needed.  All questions were answered. The patient knows to call the clinic with any problems, questions or concerns. No barriers to learning were detected.  The total time spent in the encounter was 40 minutes and more than 50% was on counseling and review of test results   ZaVentura SellersMD Medical Director of Neuro-Oncology [facility] We[place]otte0/16/23 9:28 AM

## 2022-01-14 ENCOUNTER — Encounter: Payer: Self-pay | Admitting: Internal Medicine

## 2022-01-16 ENCOUNTER — Other Ambulatory Visit: Payer: Self-pay

## 2022-01-21 ENCOUNTER — Encounter: Payer: Self-pay | Admitting: Internal Medicine

## 2022-01-21 ENCOUNTER — Other Ambulatory Visit: Payer: Self-pay | Admitting: Radiation Therapy

## 2022-01-21 MED ORDER — LACOSAMIDE 150 MG PO TABS: 150.0000 mg | ORAL_TABLET | Freq: Two times a day (BID) | ORAL | 2 refills | Status: DC

## 2022-01-22 ENCOUNTER — Other Ambulatory Visit: Payer: Self-pay

## 2022-02-05 ENCOUNTER — Encounter: Payer: Self-pay | Admitting: Internal Medicine

## 2022-02-06 ENCOUNTER — Encounter: Payer: Self-pay | Admitting: *Deleted

## 2022-02-11 ENCOUNTER — Other Ambulatory Visit: Payer: Self-pay | Admitting: Neurosurgery

## 2022-02-12 ENCOUNTER — Other Ambulatory Visit (HOSPITAL_COMMUNITY): Payer: Self-pay | Admitting: Neurosurgery

## 2022-02-12 DIAGNOSIS — C719 Malignant neoplasm of brain, unspecified: Secondary | ICD-10-CM

## 2022-02-17 NOTE — Pre-Procedure Instructions (Signed)
Surgical Instructions    Your procedure is scheduled on February 26, 2022.  Report to [facility] Main Entrance "A" at 5:30 A.M., then check in with the Admitting office.  Call this number if you have problems the morning of surgery:  [phone]   If you have any questions prior to your surgery date call [phone]: Open Monday-Friday 8am-4pm    Remember:  Do not eat or drink after midnight the night before your surgery      Take these medicines the morning of surgery with A SIP OF WATER:  Lacosamide   levETIRAcetam (KEPPRA)   NP THYROID    As of today, STOP taking any Aspirin (unless otherwise instructed by your surgeon) Aleve, Naproxen, Ibuprofen, Motrin, Advil, Goody's, BC's, all herbal medications, fish oil, and all vitamins.                     Do NOT Smoke (Tobacco/Vaping) for 24 hours prior to your procedure.  If you use a CPAP at night, you may bring your mask/headgear for your overnight stay.   Contacts, glasses, piercing's, hearing aid's, dentures or partials may not be worn into surgery, please bring cases for these belongings.    For patients admitted to the hospital, discharge time will be determined by your treatment team.   Patients discharged the day of surgery will not be allowed to drive home, and someone needs to stay with them for 24 hours.  SURGICAL WAITING ROOM VISITATION Patients having surgery or a procedure may have no more than 2 support people in the waiting area - these visitors may rotate.   Children under the age of 18 must have an adult with them who is not the patient. If the patient needs to stay at the hospital during part of their recovery, the visitor guidelines for inpatient rooms apply. Pre-op nurse will coordinate an appropriate time for 1 support person to accompany patient in pre-op.  This support person may not rotate.   Please refer to the [facility] website for the visitor guidelines for Inpatients (after your surgery is over and  you are in a regular room).    Special instructions:   [facility]- Preparing For Surgery  Before surgery, you can play an important role. Because skin is not sterile, your skin needs to be as free of germs as possible. You can reduce the number of germs on your skin by washing with CHG (chlorahexidine gluconate) Soap before surgery.  CHG is an antiseptic cleaner which kills germs and bonds with the skin to continue killing germs even after washing.    Oral Hygiene is also important to reduce your risk of infection.  Remember - BRUSH YOUR TEETH THE MORNING OF SURGERY WITH YOUR REGULAR TOOTHPASTE  Please do not use if you have an allergy to CHG or antibacterial soaps. If your skin becomes reddened/irritated stop using the CHG.  Do not shave (including legs and underarms) for at least 48 hours prior to first CHG shower. It is OK to shave your face.  Please follow these instructions carefully.   Shower the NIGHT BEFORE SURGERY and the MORNING OF SURGERY  If you chose to wash your hair, wash your hair first as usual with your normal shampoo.  After you shampoo, rinse your hair and body thoroughly to remove the shampoo.  Use CHG Soap as you would any other liquid soap. You can apply CHG directly to the skin and wash gently with a scrungie or a clean  washcloth.   Apply the CHG Soap to your body ONLY FROM THE NECK DOWN.  Do not use on open wounds or open sores. Avoid contact with your eyes, ears, mouth and genitals (private parts). Wash Face and genitals (private parts)  with your normal soap.   Wash thoroughly, paying special attention to the area where your surgery will be performed.  Thoroughly rinse your body with warm water from the neck down.  DO NOT shower/wash with your normal soap after using and rinsing off the CHG Soap.  Pat yourself dry with a CLEAN TOWEL.  Wear CLEAN PAJAMAS to bed the night before surgery  Place CLEAN SHEETS on your bed the night before your surgery  DO  NOT SLEEP WITH PETS.   Day of Surgery: Take a shower with CHG soap. Do not wear jewelry or makeup Do not wear lotions, powders, perfumes/colognes, or deodorant. Do not shave 48 hours prior to surgery.  Men may shave face and neck. Do not bring valuables to the hospital.  [facility] is not responsible for any belongings or valuables. Do not wear nail polish, gel polish, artificial nails, or any other type of covering on natural nails (fingers and toes) If you have artificial nails or gel coating that need to be removed by a nail salon, please have this removed prior to surgery. Artificial nails or gel coating may interfere with anesthesia's ability to adequately monitor your vital signs.  Wear Clean/Comfortable clothing the morning of surgery Remember to brush your teeth WITH YOUR REGULAR TOOTHPASTE.   Please read over the following fact sheets that you were given.    If you received a COVID test during your pre-op visit  it is requested that you wear a mask when out in public, stay away from anyone that may not be feeling well and notify your surgeon if you develop symptoms. If you have been in contact with anyone that has tested positive in the last 10 days please notify you surgeon.

## 2022-02-18 ENCOUNTER — Encounter (HOSPITAL_COMMUNITY)
Admission: RE | Admit: 2022-02-18 | Discharge: 2022-02-18 | Disposition: A | Discharge status: Home / Self Care | Payer: BC Managed Care – PPO | Source: Ambulatory Visit | Attending: Neurosurgery | Admitting: Neurosurgery

## 2022-02-18 ENCOUNTER — Other Ambulatory Visit: Payer: Self-pay

## 2022-02-18 ENCOUNTER — Other Ambulatory Visit: Payer: Self-pay | Admitting: Neurosurgery

## 2022-02-18 ENCOUNTER — Encounter (HOSPITAL_COMMUNITY): Payer: Self-pay

## 2022-02-18 VITALS — BP 123/88 | HR 67 | Temp 97.5°F | Resp 17 | Ht 68.0 in | Wt 133.0 lb

## 2022-02-18 DIAGNOSIS — R569 Unspecified convulsions: Secondary | ICD-10-CM | POA: Diagnosis not present

## 2022-02-18 DIAGNOSIS — Z79899 Other long term (current) drug therapy: Secondary | ICD-10-CM | POA: Diagnosis not present

## 2022-02-18 DIAGNOSIS — Z923 Personal history of irradiation: Secondary | ICD-10-CM | POA: Diagnosis not present

## 2022-02-18 DIAGNOSIS — Z01812 Encounter for preprocedural laboratory examination: Secondary | ICD-10-CM | POA: Diagnosis present

## 2022-02-18 DIAGNOSIS — Z01818 Encounter for other preprocedural examination: Secondary | ICD-10-CM

## 2022-02-18 DIAGNOSIS — C719 Malignant neoplasm of brain, unspecified: Secondary | ICD-10-CM | POA: Insufficient documentation

## 2022-02-18 LAB — TYPE AND SCREEN
ABO/RH(D): B POS
Antibody Screen: NEGATIVE

## 2022-02-18 LAB — CBC
HCT: 40.7 % (ref 36.0–46.0)
Hemoglobin: 14.4 g/dL (ref 12.0–15.0)
MCH: 32.2 pg (ref 26.0–34.0)
MCHC: 35.4 g/dL (ref 30.0–36.0)
MCV: 91.1 fL (ref 80.0–100.0)
Platelets: 248 10*3/uL (ref 150–400)
RBC: 4.47 MIL/uL (ref 3.87–5.11)
RDW: 11.4 % — ABNORMAL LOW (ref 11.5–15.5)
WBC: 3.9 10*3/uL — ABNORMAL LOW (ref 4.0–10.5)
nRBC: 0 % (ref 0.0–0.2)

## 2022-02-18 LAB — BASIC METABOLIC PANEL
Anion gap: 9 (ref 5–15)
BUN: 15 mg/dL (ref 6–20)
CO2: 25 mmol/L (ref 22–32)
Calcium: 9.4 mg/dL (ref 8.9–10.3)
Chloride: 104 mmol/L (ref 98–111)
Creatinine, Ser: 0.93 mg/dL (ref 0.44–1.00)
GFR, Estimated: >60 mL/min (ref >60)
Glucose, Bld: 95 mg/dL (ref 70–99)
Potassium: 4 mmol/L (ref 3.5–5.1)
Sodium: 138 mmol/L (ref 135–145)

## 2022-02-18 NOTE — Progress Notes (Signed)
PCP - N/A Cardiologist - Denies  PPM/ICD - Denies  Chest x-ray - N/A EKG - N/A Stress Test - Denies ECHO - Denies Cardiac Cath - Denies  Sleep Study - Denies  Diabetes: Denies  Blood Thinner Instructions: N/A Aspirin Instructions: N/A  ERAS Protcol - No  COVID TEST- N/A   Anesthesia review: Yes, hx of seizures. Last seizure was about a week ago. Patient states she has seizures once a week.  Patient denies shortness of breath, fever, cough and chest pain at PAT appointment   All instructions explained to the patient, with a verbal understanding of the material. Patient agrees to go over the instructions while at home for a better understanding. Patient also instructed to self quarantine after being tested for COVID-19. The opportunity to ask questions was provided.

## 2022-02-18 NOTE — Anesthesia Preprocedure Evaluation (Addendum)
Anesthesia Evaluation  Patient identified by MRN, date of birth, ID band Patient awake    Reviewed: Allergy & Precautions, NPO status , Patient's Chart, lab work & pertinent test results  History of Anesthesia Complications Negative for: history of anesthetic complications  Airway Mallampati: I  TM Distance: >3 FB Neck ROM: Full    Dental  (+) Dental Advisory Given, Teeth Intact   Pulmonary neg pulmonary ROS   breath sounds clear to auscultation       Cardiovascular negative cardio ROS  Rhythm:Regular Rate:Normal     Neuro/Psych  Headaches, Seizures - (frequent),  Astrocytoma: XRT    GI/Hepatic negative GI ROS, Neg liver ROS,,,  Endo/Other  negative endocrine ROS    Renal/GU negative Renal ROS     Musculoskeletal   Abdominal   Peds  Hematology negative hematology ROS (+)   Anesthesia Other Findings   Reproductive/Obstetrics LMP 02/07/2022                              Anesthesia Physical Anesthesia Plan  ASA: 3  Anesthesia Plan: General   Post-op Pain Management: Tylenol PO (pre-op)*   Induction: Intravenous  PONV Risk Score and Plan: 3 and Ondansetron and Dexamethasone  Airway Management Planned: Oral ETT  Additional Equipment: ClearSight  Intra-op Plan:   Post-operative Plan: Extubation in OR  Informed Consent: I have reviewed the patients History and Physical, chart, labs and discussed the procedure including the risks, benefits and alternatives for the proposed anesthesia with the patient or authorized representative who has indicated his/her understanding and acceptance.     Dental advisory given  Plan Discussed with: CRNA and Surgeon  Anesthesia Plan Comments: (PAT note written 02/18/2022 by Myra Gianotti, PA-C.  )        Anesthesia Quick Evaluation

## 2022-02-18 NOTE — Progress Notes (Signed)
Anesthesia Chart Review:  Case: [id number] Date/Time: 02/26/22 0715   Procedures:      FRONTAL PLACEMENT OF OMMAYA RESERVOIR W/ AXIEM (Left)     APPLICATION OF CRANIAL NAVIGATION (Left)   Anesthesia type: General   Pre-op diagnosis: ASTROCYTOMA DETERMINED BY BIOPSY OF BRAIN   Location: [facility] OR ROOM 21 / [facility]   Surgeons: Vallarie Mare, MD       DISCUSSION: Patient is a 41 year old female scheduled for the above procedure.   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  She has known seizures since her brain mass diagnosis in September 2021. She has undergone the above treatments and is followed by Dr. Mickeal Skinner and Dr. Marcello Moores. Last seizure was about 1 week ago, but reports that she typically has about one seizure a week. She is on Lacosamide, Keppra for seizure prevention. Last visit with Dr. Mickeal Skinner was on 01/13/22. 01/10/22 MRI showed increase in volume of cystic component left fontal mass, but per review by Dr. Mickeal Skinner, "There is no explicit evidence of neoplastic progression at this time." He discussed ommaya drain placement or shunt as she had "several good months" after cyst aspiration in April. She now has elected to proceed with above procedure.   Anesthesia team to evaluate on the day of surgery. For urine pregnancy test on arrival.   VS: BP 123/88   Pulse 67   Temp (!) 36.4 C   Resp 17   Ht '5\' 8"'$  (1.727 m)   Wt 60.3 kg   LMP 02/07/2022   SpO2 100%   BMI 20.22 kg/m   PROVIDERS: Patient, No Pcp Per Cecil Cobbs, MD is Wess Botts, MD is [facility]   LABS: Labs reviewed: Acceptable for surgery. (all labs ordered are listed, but only abnormal results are displayed)  Labs Reviewed  CBC - Abnormal; Notable for the following components:      Result Value   WBC 3.9 (*)     RDW 11.4 (*)    All other components within normal limits  BASIC METABOLIC PANEL  TYPE AND SCREEN     IMAGES:  MRI Brain 01/10/22: IMPRESSION: Slight increase in size of centrally cystic/necrotic mass of the left frontal lobe. The degree of surrounding hyperintense T2-weighted signal is unchanged.     EKG: Last EKG > 1 year ago.   CV: N/A  Past Medical History:  Diagnosis Date   Brain tumor [facility])    Complication of anesthesia 2005   trouble waking   Headache    Seizure ([place]) 2020   UTI (urinary tract infection)     Past Surgical History:  Procedure Laterality Date   ABDOMINAL SURGERY     APPLICATION OF CRANIAL NAVIGATION Left 12/08/2019   Procedure: APPLICATION OF CRANIAL NAVIGATION;  Surgeon: Vallarie Mare, MD;  Location: [place];  Service: Neurosurgery;  Laterality: Left;  APPLICATION OF CRANIAL NAVIGATION   APPLICATION OF CRANIAL NAVIGATION Left 07/03/2021   Procedure: APPLICATION OF CRANIAL NAVIGATION;  Surgeon: Vallarie Mare, MD;  Location: [place];  Service: Neurosurgery;  Laterality: Left;   CRANIOTOMY Left 07/03/2021   Procedure: STEREOTACTIC DRAINAGE OF LEFT FRONTAL LOBE CYST;  Surgeon: Vallarie Mare, MD;  Location: [place];  Service: Neurosurgery;  Laterality: Left;   FRAMELESS  BIOPSY WITH BRAINLAB Left 12/08/2019   Procedure: Left stereotactic brain biopsy  with brainlab;  Surgeon: Vallarie Mare, MD;  Location: [place];  Service: Neurosurgery;  Laterality: Left;  Left stereotactic brain biopsy with brainlab   WISDOM TOOTH EXTRACTION      MEDICATIONS:  Barberry-Oreg Grape-Goldenseal (BERBERINE COMPLEX PO)   Green Tea, Camellia sinensis, (GREEN TEA PO)   ibuprofen (ADVIL) 200 MG tablet   Lacosamide 150 MG TABS   levETIRAcetam (KEPPRA) 1000 MG tablet   LYCOPENE PO   MAGNESIUM MALATE PO   Melatonin 10 MG TABS   NP THYROID 60 MG tablet   Omega-3 Fatty Acids (FISH OIL) 1000 MG CAPS   OVER THE COUNTER MEDICATION   OVER THE COUNTER MEDICATION   OVER  THE COUNTER MEDICATION   Prasterone, DHEA, (DHEA PO)   PRESCRIPTION MEDICATION   RESVERATROL PO   SPIRULINA PO   TURMERIC PO   Vitamin D-Vitamin K (VITAMIN K2-VITAMIN D3 PO)   No current facility-administered medications for this encounter.   Myra Gianotti, PA-C Surgical Short Stay/Anesthesiology [facility] Phone [phone] [facility] Phone [phone] 02/18/2022 4:49 PM

## 2022-02-25 ENCOUNTER — Other Ambulatory Visit (HOSPITAL_COMMUNITY): Payer: Self-pay | Admitting: Neurosurgery

## 2022-02-25 ENCOUNTER — Other Ambulatory Visit: Payer: Self-pay | Admitting: *Deleted

## 2022-02-25 ENCOUNTER — Encounter: Payer: Self-pay | Admitting: Internal Medicine

## 2022-02-25 ENCOUNTER — Inpatient Hospital Stay (HOSPITAL_COMMUNITY)
Admission: RE | Admit: 2022-02-25 | Discharge: 2022-02-25 | Disposition: A | Discharge status: Home / Self Care | Payer: BC Managed Care – PPO | Source: Ambulatory Visit | Attending: Neurosurgery | Admitting: Neurosurgery

## 2022-02-25 DIAGNOSIS — C719 Malignant neoplasm of brain, unspecified: Secondary | ICD-10-CM | POA: Insufficient documentation

## 2022-02-25 MED ORDER — LEVETIRACETAM 1000 MG PO TABS: ORAL_TABLET | ORAL | 1 refills | Status: DC

## 2022-02-26 ENCOUNTER — Inpatient Hospital Stay (HOSPITAL_COMMUNITY): Payer: BC Managed Care – PPO | Admitting: Vascular Surgery

## 2022-02-26 ENCOUNTER — Encounter (HOSPITAL_COMMUNITY)
Admission: RE | Disposition: A | Discharge status: Home / Self Care | Payer: Self-pay | Source: Home / Self Care | Attending: Neurosurgery

## 2022-02-26 ENCOUNTER — Encounter (HOSPITAL_COMMUNITY): Payer: Self-pay

## 2022-02-26 ENCOUNTER — Inpatient Hospital Stay (HOSPITAL_COMMUNITY)
Admission: RE | Admit: 2022-02-26 | Discharge: 2022-02-27 | DRG: 026 | Disposition: A | Discharge status: Home / Self Care | Payer: BC Managed Care – PPO | Attending: Neurosurgery | Admitting: Neurosurgery

## 2022-02-26 ENCOUNTER — Other Ambulatory Visit: Payer: Self-pay

## 2022-02-26 ENCOUNTER — Inpatient Hospital Stay (HOSPITAL_COMMUNITY): Payer: BC Managed Care – PPO | Admitting: Physician Assistant

## 2022-02-26 DIAGNOSIS — G930 Cerebral cysts: Principal | ICD-10-CM | POA: Diagnosis present

## 2022-02-26 DIAGNOSIS — Z881 Allergy status to other antibiotic agents status: Secondary | ICD-10-CM

## 2022-02-26 DIAGNOSIS — Z79899 Other long term (current) drug therapy: Secondary | ICD-10-CM

## 2022-02-26 DIAGNOSIS — C719 Malignant neoplasm of brain, unspecified: Secondary | ICD-10-CM | POA: Diagnosis present

## 2022-02-26 DIAGNOSIS — Z888 Allergy status to other drugs, medicaments and biological substances status: Secondary | ICD-10-CM

## 2022-02-26 DIAGNOSIS — Z923 Personal history of irradiation: Secondary | ICD-10-CM | POA: Diagnosis not present

## 2022-02-26 DIAGNOSIS — Z808 Family history of malignant neoplasm of other organs or systems: Secondary | ICD-10-CM | POA: Diagnosis not present

## 2022-02-26 DIAGNOSIS — Z880 Allergy status to penicillin: Secondary | ICD-10-CM

## 2022-02-26 DIAGNOSIS — Z9221 Personal history of antineoplastic chemotherapy: Secondary | ICD-10-CM

## 2022-02-26 DIAGNOSIS — G4089 Other seizures: Secondary | ICD-10-CM | POA: Diagnosis present

## 2022-02-26 DIAGNOSIS — Z01818 Encounter for other preprocedural examination: Secondary | ICD-10-CM

## 2022-02-26 HISTORY — PX: CHEMO RESERVIOR INSERTION: SHX2099

## 2022-02-26 HISTORY — PX: APPLICATION OF CRANIAL NAVIGATION: SHX6578

## 2022-02-26 SURGERY — CHEMO RESERVOIR INSERTION
Anesthesia: General | Laterality: Left

## 2022-02-26 MED ORDER — POTASSIUM CHLORIDE IN NACL 20-0.9 MEQ/L-% IV SOLN
INTRAVENOUS | Status: DC
  Filled 2022-02-26: qty 1000

## 2022-02-26 MED ORDER — ORAL CARE MOUTH RINSE
15.0000 mL | Freq: Once | OROMUCOSAL | Status: AC
  Administered 2022-02-26: 15 mL via OROMUCOSAL

## 2022-02-26 MED ORDER — LIDOCAINE-EPINEPHRINE 1 %-1:100000 IJ SOLN
INTRAMUSCULAR | Status: AC
  Filled 2022-02-26: qty 1

## 2022-02-26 MED ORDER — BUPIVACAINE HCL (PF) 0.25 % IJ SOLN
INTRAMUSCULAR | Status: DC | PRN
  Administered 2022-02-26: 3 mL

## 2022-02-26 MED ORDER — EPHEDRINE SULFATE-NACL 50-0.9 MG/10ML-% IV SOSY
PREFILLED_SYRINGE | INTRAVENOUS | Status: DC | PRN
  Administered 2022-02-26: 5 mg via INTRAVENOUS
  Administered 2022-02-26 (×2): 10 mg via INTRAVENOUS

## 2022-02-26 MED ORDER — THROMBIN 5000 UNITS EX SOLR
CUTANEOUS | Status: AC
  Filled 2022-02-26: qty 5000

## 2022-02-26 MED ORDER — OXYCODONE HCL 5 MG/5ML PO SOLN: 5.0000 mg | Freq: Once | ORAL | Status: DC | PRN

## 2022-02-26 MED ORDER — CHLORHEXIDINE GLUCONATE 0.12 % MT SOLN: 15.0000 mL | Freq: Once | OROMUCOSAL | Status: AC

## 2022-02-26 MED ORDER — ROCURONIUM BROMIDE 10 MG/ML (PF) SYRINGE
PREFILLED_SYRINGE | INTRAVENOUS | Status: DC | PRN
  Administered 2022-02-26: 40 mg via INTRAVENOUS
  Administered 2022-02-26: 60 mg via INTRAVENOUS
  Administered 2022-02-26: 20 mg via INTRAVENOUS

## 2022-02-26 MED ORDER — MAGNESIUM MALATE 1250 (141.7 MG) MG PO TABS: 1350.0000 mg | ORAL_TABLET | Freq: Every day | ORAL | Status: DC

## 2022-02-26 MED ORDER — SODIUM CHLORIDE 0.9 % IV SOLN: INTRAVENOUS | Status: DC | PRN

## 2022-02-26 MED ORDER — LEVETIRACETAM 500 MG PO TABS: 1000.0000 mg | ORAL_TABLET | Freq: Two times a day (BID) | ORAL | Status: DC

## 2022-02-26 MED ORDER — FENTANYL CITRATE (PF) 250 MCG/5ML IJ SOLN
INTRAMUSCULAR | Status: AC
  Filled 2022-02-26: qty 5

## 2022-02-26 MED ORDER — OXYCODONE HCL 5 MG PO TABS: 5.0000 mg | ORAL_TABLET | Freq: Once | ORAL | Status: DC | PRN

## 2022-02-26 MED ORDER — PROMETHAZINE HCL 25 MG/ML IJ SOLN: 6.2500 mg | INTRAMUSCULAR | Status: DC | PRN

## 2022-02-26 MED ORDER — OMEGA-3-ACID ETHYL ESTERS 1 G PO CAPS
2000.0000 mg | ORAL_CAPSULE | Freq: Every day | ORAL | Status: DC
  Administered 2022-02-26: 2000 mg via ORAL
  Filled 2022-02-26 (×2): qty 2

## 2022-02-26 MED ORDER — HYDROMORPHONE HCL 1 MG/ML IJ SOLN: 0.2500 mg | INTRAMUSCULAR | Status: DC | PRN

## 2022-02-26 MED ORDER — LIDOCAINE-EPINEPHRINE 1 %-1:100000 IJ SOLN
INTRAMUSCULAR | Status: DC | PRN
  Administered 2022-02-26: 3 mL

## 2022-02-26 MED ORDER — ONDANSETRON HCL 4 MG/2ML IJ SOLN: 4.0000 mg | INTRAMUSCULAR | Status: DC | PRN

## 2022-02-26 MED ORDER — MEPERIDINE HCL 25 MG/ML IJ SOLN: 6.2500 mg | INTRAMUSCULAR | Status: DC | PRN

## 2022-02-26 MED ORDER — LACOSAMIDE 50 MG PO TABS
150.0000 mg | ORAL_TABLET | Freq: Two times a day (BID) | ORAL | Status: DC
  Administered 2022-02-26 – 2022-02-27 (×2): 150 mg via ORAL
  Filled 2022-02-26 (×2): qty 3

## 2022-02-26 MED ORDER — LIDOCAINE 2% (20 MG/ML) 5 ML SYRINGE
INTRAMUSCULAR | Status: DC | PRN
  Administered 2022-02-26: 30 mg via INTRAVENOUS

## 2022-02-26 MED ORDER — DEXAMETHASONE SODIUM PHOSPHATE 10 MG/ML IJ SOLN
INTRAMUSCULAR | Status: AC
  Filled 2022-02-26: qty 1

## 2022-02-26 MED ORDER — PHENYLEPHRINE 80 MCG/ML (10ML) SYRINGE FOR IV PUSH (FOR BLOOD PRESSURE SUPPORT)
PREFILLED_SYRINGE | INTRAVENOUS | Status: DC | PRN
  Administered 2022-02-26 (×2): 80 ug via INTRAVENOUS
  Administered 2022-02-26: 160 ug via INTRAVENOUS
  Administered 2022-02-26 (×3): 80 ug via INTRAVENOUS

## 2022-02-26 MED ORDER — LEVETIRACETAM 750 MG PO TABS
2000.0000 mg | ORAL_TABLET | Freq: Two times a day (BID) | ORAL | Status: DC
  Administered 2022-02-26 – 2022-02-27 (×2): 2000 mg via ORAL
  Filled 2022-02-26 (×3): qty 1

## 2022-02-26 MED ORDER — DEXAMETHASONE SODIUM PHOSPHATE 10 MG/ML IJ SOLN
INTRAMUSCULAR | Status: DC | PRN
  Administered 2022-02-26: 10 mg via INTRAVENOUS

## 2022-02-26 MED ORDER — ENALAPRILAT 1.25 MG/ML IV SOLN: 1.2500 mg | Freq: Four times a day (QID) | INTRAVENOUS | Status: DC | PRN

## 2022-02-26 MED ORDER — ROCURONIUM BROMIDE 10 MG/ML (PF) SYRINGE
PREFILLED_SYRINGE | INTRAVENOUS | Status: AC
  Filled 2022-02-26: qty 10

## 2022-02-26 MED ORDER — LACTATED RINGERS IV SOLN: INTRAVENOUS | Status: DC

## 2022-02-26 MED ORDER — PROPOFOL 10 MG/ML IV BOLUS
INTRAVENOUS | Status: DC | PRN
  Administered 2022-02-26: 100 mg via INTRAVENOUS

## 2022-02-26 MED ORDER — LIDOCAINE 2% (20 MG/ML) 5 ML SYRINGE
INTRAMUSCULAR | Status: AC
  Filled 2022-02-26: qty 5

## 2022-02-26 MED ORDER — HYDROCODONE-ACETAMINOPHEN 5-325 MG PO TABS
1.0000 | ORAL_TABLET | ORAL | Status: DC | PRN
  Administered 2022-02-26 – 2022-02-27 (×2): 1 via ORAL
  Filled 2022-02-26 (×3): qty 1

## 2022-02-26 MED ORDER — ACETAMINOPHEN 500 MG PO TABS
1000.0000 mg | ORAL_TABLET | Freq: Once | ORAL | Status: AC
  Administered 2022-02-26: 1000 mg via ORAL
  Filled 2022-02-26: qty 2

## 2022-02-26 MED ORDER — RESVERATROL 250 MG PO CAPS: 800.0000 mg | ORAL_CAPSULE | Freq: Every day | ORAL | Status: DC

## 2022-02-26 MED ORDER — ONDANSETRON HCL 4 MG/2ML IJ SOLN
INTRAMUSCULAR | Status: DC | PRN
  Administered 2022-02-26: 4 mg via INTRAVENOUS

## 2022-02-26 MED ORDER — NICARDIPINE HCL IN NACL 20-0.86 MG/200ML-% IV SOLN: 3.0000 mg/h | INTRAVENOUS | Status: DC

## 2022-02-26 MED ORDER — BUPIVACAINE HCL (PF) 0.5 % IJ SOLN
INTRAMUSCULAR | Status: AC
  Filled 2022-02-26: qty 30

## 2022-02-26 MED ORDER — FLEET ENEMA 7-19 GM/118ML RE ENEM: 1.0000 | ENEMA | Freq: Once | RECTAL | Status: DC | PRN

## 2022-02-26 MED ORDER — DOCUSATE SODIUM 100 MG PO CAPS
100.0000 mg | ORAL_CAPSULE | Freq: Two times a day (BID) | ORAL | Status: DC
  Administered 2022-02-26 (×2): 100 mg via ORAL
  Filled 2022-02-26 (×2): qty 1

## 2022-02-26 MED ORDER — ACETAMINOPHEN 650 MG RE SUPP: 650.0000 mg | RECTAL | Status: DC | PRN

## 2022-02-26 MED ORDER — 0.9 % SODIUM CHLORIDE (POUR BTL) OPTIME
TOPICAL | Status: DC | PRN
  Administered 2022-02-26: 1000 mL

## 2022-02-26 MED ORDER — BACITRACIN ZINC 500 UNIT/GM EX OINT
TOPICAL_OINTMENT | CUTANEOUS | Status: AC
  Filled 2022-02-26: qty 28.35

## 2022-02-26 MED ORDER — VANCOMYCIN HCL IN DEXTROSE 1-5 GM/200ML-% IV SOLN
1000.0000 mg | INTRAVENOUS | Status: AC
  Administered 2022-02-26: 1000 mg via INTRAVENOUS
  Filled 2022-02-26: qty 200

## 2022-02-26 MED ORDER — MAGNESIUM GLUCONATE 500 MG PO TABS
500.0000 mg | ORAL_TABLET | Freq: Every day | ORAL | Status: DC
  Administered 2022-02-26: 500 mg via ORAL
  Filled 2022-02-26 (×2): qty 1

## 2022-02-26 MED ORDER — POLYETHYLENE GLYCOL 3350 17 G PO PACK
17.0000 g | PACK | Freq: Every day | ORAL | Status: DC | PRN
  Administered 2022-02-26: 17 g via ORAL
  Filled 2022-02-26: qty 1

## 2022-02-26 MED ORDER — THYROID 60 MG PO TABS
60.0000 mg | ORAL_TABLET | Freq: Every morning | ORAL | Status: DC
  Filled 2022-02-26: qty 1

## 2022-02-26 MED ORDER — VANCOMYCIN HCL 750 MG/150ML IV SOLN
750.0000 mg | Freq: Two times a day (BID) | INTRAVENOUS | Status: DC
  Administered 2022-02-26: 750 mg via INTRAVENOUS
  Filled 2022-02-26 (×2): qty 150

## 2022-02-26 MED ORDER — MELATONIN 5 MG PO TABS
60.0000 mg | ORAL_TABLET | Freq: Every day | ORAL | Status: DC
  Administered 2022-02-26: 60 mg via ORAL
  Filled 2022-02-26 (×3): qty 12

## 2022-02-26 MED ORDER — PROMETHAZINE HCL 25 MG PO TABS: 12.5000 mg | ORAL_TABLET | ORAL | Status: DC | PRN

## 2022-02-26 MED ORDER — TURMERIC 1053 MG PO TABS: 1300.0000 mg | ORAL_TABLET | Freq: Every day | ORAL | Status: DC

## 2022-02-26 MED ORDER — MORPHINE SULFATE (PF) 2 MG/ML IV SOLN: 1.0000 mg | INTRAVENOUS | Status: DC | PRN

## 2022-02-26 MED ORDER — ONDANSETRON HCL 4 MG/2ML IJ SOLN
INTRAMUSCULAR | Status: AC
  Filled 2022-02-26: qty 2

## 2022-02-26 MED ORDER — ACETAMINOPHEN 325 MG PO TABS: 650.0000 mg | ORAL_TABLET | ORAL | Status: DC | PRN

## 2022-02-26 MED ORDER — THROMBIN (RECOMBINANT) 5000 UNITS EX SOLR: CUTANEOUS | Status: DC | PRN

## 2022-02-26 MED ORDER — LABETALOL HCL 5 MG/ML IV SOLN: 10.0000 mg | INTRAVENOUS | Status: DC | PRN

## 2022-02-26 MED ORDER — MIDAZOLAM HCL 2 MG/2ML IJ SOLN: 0.5000 mg | Freq: Once | INTRAMUSCULAR | Status: DC | PRN

## 2022-02-26 MED ORDER — ONDANSETRON HCL 4 MG PO TABS: 4.0000 mg | ORAL_TABLET | ORAL | Status: DC | PRN

## 2022-02-26 MED ORDER — FENTANYL CITRATE (PF) 100 MCG/2ML IJ SOLN
INTRAMUSCULAR | Status: DC | PRN
  Administered 2022-02-26: 50 ug via INTRAVENOUS
  Administered 2022-02-26: 200 ug via INTRAVENOUS

## 2022-02-26 MED ORDER — PROPOFOL 10 MG/ML IV BOLUS
INTRAVENOUS | Status: AC
  Filled 2022-02-26: qty 20

## 2022-02-26 MED ORDER — SUGAMMADEX SODIUM 200 MG/2ML IV SOLN
INTRAVENOUS | Status: DC | PRN
  Administered 2022-02-26: 200 mg via INTRAVENOUS

## 2022-02-26 SURGICAL SUPPLY — 61 items
BAG COUNTER SPONGE SURGICOUNT (BAG) ×1 IMPLANT
BLADE CLIPPER SURG (BLADE) ×1 IMPLANT
BLADE SURG 10 STRL SS (BLADE) ×1 IMPLANT
BLADE SURG 15 STRL LF DISP TIS (BLADE) ×1 IMPLANT
BLADE SURG 15 STRL SS (BLADE) ×1
BNDG GAUZE DERMACEA FLUFF 4 (GAUZE/BANDAGES/DRESSINGS) IMPLANT
BNDG STRETCH 4X75 STRL LF (GAUZE/BANDAGES/DRESSINGS) IMPLANT
BOOT SUTURE AID YELLOW STND (SUTURE) IMPLANT
BUR ACORN 6.0 PRECISION (BURR) ×1 IMPLANT
CANISTER SUCT 3000ML PPV (MISCELLANEOUS) ×1 IMPLANT
CATH VENTRICULAR 14CMX1.4MM (Miscellaneous) IMPLANT
CLIP RANEY DISP (INSTRUMENTS) IMPLANT
CNTNR URN SCR LID CUP LEK RST (MISCELLANEOUS) IMPLANT
CONT SPEC 4OZ STRL OR WHT (MISCELLANEOUS) ×2
COVERAGE SUPPORT O-ARM STEALTH (MISCELLANEOUS) ×1 IMPLANT
DRAPE HALF SHEET 40X57 (DRAPES) IMPLANT
DRAPE INCISE IOBAN 66X45 STRL (DRAPES) ×1 IMPLANT
DRSG AQUACEL AG ADV 3.5X 4 (GAUZE/BANDAGES/DRESSINGS) IMPLANT
ELECT CAUTERY BLADE 6.4 (BLADE) ×1 IMPLANT
ELECT REM PT RETURN 9FT ADLT (ELECTROSURGICAL) ×1
ELECTRODE REM PT RTRN 9FT ADLT (ELECTROSURGICAL) ×1 IMPLANT
FEE COVERAGE SUPPORT O-ARM (MISCELLANEOUS) ×1 IMPLANT
GAUZE 4X4 16PLY ~~LOC~~+RFID DBL (SPONGE) ×1 IMPLANT
GAUZE SPONGE 4X4 12PLY STRL (GAUZE/BANDAGES/DRESSINGS) IMPLANT
GLOVE BIO SURGEON STRL SZ8 (GLOVE) ×1 IMPLANT
GLOVE BIOGEL PI IND STRL 8.5 (GLOVE) ×1 IMPLANT
GLOVE EXAM NITRILE XL STR (GLOVE) IMPLANT
GOWN STRL REUS W/ TWL LRG LVL3 (GOWN DISPOSABLE) IMPLANT
GOWN STRL REUS W/ TWL XL LVL3 (GOWN DISPOSABLE) IMPLANT
GOWN STRL REUS W/TWL 2XL LVL3 (GOWN DISPOSABLE) IMPLANT
GOWN STRL REUS W/TWL LRG LVL3 (GOWN DISPOSABLE)
GOWN STRL REUS W/TWL XL LVL3 (GOWN DISPOSABLE)
HEMOSTAT SURGICEL 2X14 (HEMOSTASIS) IMPLANT
KIT BASIN OR (CUSTOM PROCEDURE TRAY) ×1 IMPLANT
KIT TURNOVER KIT B (KITS) ×1 IMPLANT
NDL HYPO 25X1 1.5 SAFETY (NEEDLE) ×1 IMPLANT
NEEDLE HYPO 25X1 1.5 SAFETY (NEEDLE) ×2 IMPLANT
NS IRRIG 1000ML POUR BTL (IV SOLUTION) ×1 IMPLANT
PACK EENT II TURBAN DRAPE (CUSTOM PROCEDURE TRAY) ×1 IMPLANT
PAD ARMBOARD 7.5X6 YLW CONV (MISCELLANEOUS) ×3 IMPLANT
PATTIES SURGICAL .5 X.5 (GAUZE/BANDAGES/DRESSINGS) IMPLANT
PENCIL BUTTON HOLSTER BLD 10FT (ELECTRODE) ×1 IMPLANT
RESERVOIR CSF FLAT BOTTOM OMMA (Shunt) IMPLANT
SPIKE FLUID TRANSFER (MISCELLANEOUS) ×1 IMPLANT
SPONGE SURGIFOAM ABS GEL 12-7 (HEMOSTASIS) IMPLANT
SPONGE SURGIFOAM ABS GEL SZ50 (HEMOSTASIS) IMPLANT
STAPLER SKIN PROX WIDE 3.9 (STAPLE) ×1 IMPLANT
STAPLER VISISTAT 35W (STAPLE) IMPLANT
STYLET 2 COIL SINGLE (INSTRUMENTS) IMPLANT
SUT BONE WAX W31G (SUTURE) IMPLANT
SUT NURALON 4 0 TR CR/8 (SUTURE) IMPLANT
SUT SILK 2 0 TIES 10X30 (SUTURE) IMPLANT
SUT VIC AB 2-0 CP2 18 (SUTURE) ×1 IMPLANT
SYR BULB EAR ULCER 3OZ GRN STR (SYRINGE) ×1 IMPLANT
SYR CONTROL 10ML LL (SYRINGE) ×1 IMPLANT
TOWEL GREEN STERILE (TOWEL DISPOSABLE) ×1 IMPLANT
TOWEL GREEN STERILE FF (TOWEL DISPOSABLE) ×1 IMPLANT
TRACKER ENT PATIENT (MISCELLANEOUS) IMPLANT
TRAY FOLEY MTR SLVR 16FR STAT (SET/KITS/TRAYS/PACK) IMPLANT
TUBE CONNECTING 12X1/4 (SUCTIONS) ×1 IMPLANT
WATER STERILE IRR 1000ML POUR (IV SOLUTION) ×1 IMPLANT

## 2022-02-26 NOTE — Transfer of Care (Signed)
Immediate Anesthesia Transfer of Care Note  Patient: Kaetlin Bullen  Procedure(s) Performed: FRONTAL PLACEMENT OF OMMAYA CHEMO RESERVOIR (Left) APPLICATION OF CRANIAL NAVIGATION (Left)  Patient Location: PACU  Anesthesia Type:General  Level of Consciousness: oriented and drowsy  Airway & Oxygen Therapy: Patient Spontanous Breathing and Patient connected to nasal cannula oxygen  Post-op Assessment: Report given to RN and Patient moving all extremities X 4  Post vital signs: Reviewed and stable  Last Vitals:  Vitals Value Taken Time  BP 111/64 02/26/22 0936  Temp    Pulse 105 02/26/22 0936  Resp 16 02/26/22 0936  SpO2 97 % 02/26/22 0936  Vitals shown include unvalidated device data.  Last Pain:  Vitals:   02/26/22 0635  TempSrc:   PainSc: 1          Complications: No notable events documented.

## 2022-02-26 NOTE — Anesthesia Procedure Notes (Signed)
Procedure Name: Intubation Date/Time: 02/26/2022 8:13 AM  Performed by: Barrington Ellison, CRNAPre-anesthesia Checklist: Patient identified, Emergency Drugs available, Suction available and Patient being monitored Patient Re-evaluated:Patient Re-evaluated prior to induction Oxygen Delivery Method: Circle System Utilized Preoxygenation: Pre-oxygenation with 100% oxygen Induction Type: IV induction Ventilation: Mask ventilation without difficulty Laryngoscope Size: Mac and 3 Grade View: Grade I Tube type: Oral Tube size: 7.0 mm Number of attempts: 1 Airway Equipment and Method: Stylet and Oral airway Placement Confirmation: ETT inserted through vocal cords under direct vision, positive ETCO2 and breath sounds checked- equal and bilateral Secured at: 21 cm Tube secured with: Tape Dental Injury: Teeth and Oropharynx as per pre-operative assessment

## 2022-02-26 NOTE — Progress Notes (Signed)
Pharmacy Antibiotic Note  Candace [name] is a 41 y.o. female admitted on 02/26/2022 with astrocytoma s/p chemoradiation presents with increasing size of brain cyst here for placement of ommaya chemo reservoir 11/29.   Pharmacy has been consulted for vancomycin dosing for surgical prophylaxis x 24 hrs.  Renal function stable.  Received vancomycin 1gm IV this AM around 0700.  Plan: Vanc '750mg'$  IV Q12H x 2 doses Pharmacy will sign off  Height: '5\' 8"'$  (172.7 cm) Weight: 59.9 kg (132 lb) IBW/kg (Calculated) : 63.9  Temp (24hrs), Avg:98.1 F (36.7 C), Min:97.7 F (36.5 C), Max:98.6 F (37 C)  No results for input(s): "WBC", "CREATININE", "LATICACIDVEN", "VANCOTROUGH", "VANCOPEAK", "VANCORANDOM", "GENTTROUGH", "GENTPEAK", "GENTRANDOM", "TOBRATROUGH", "TOBRAPEAK", "TOBRARND", "AMIKACINPEAK", "AMIKACINTROU", "AMIKACIN" in the last 168 hours.  Estimated Creatinine Clearance: 76.8 mL/min (by C-G formula based on SCr of 0.93 mg/dL).    Allergies  Allergen Reactions   Trazodone And Nefazodone Itching   Amitriptyline     Severe joint pain    Chlorhexidine Itching   Penicillins Rash   Tape Rash    Clear tape   Vancomycin Itching and Rash    Infusion related (red-man); give slower    Merideth Bosque D. Mina Marble, PharmD, BCPS, [place] 02/26/2022, 1:56 PM

## 2022-02-26 NOTE — H&P (Signed)
CC: brain cyst  HPI:     Patient is a 41 y.o. female with astrocytoma s/p chemoradiation presents with increasing size of brain cyst. She has had increased speech difficulty, seizure frequency (now once per week), and hand weakness.  She is here for elective Ommaya placement.    Patient Active Problem List   Diagnosis Date Noted   Astrocytoma brain tumor ([place]) 07/03/2021   Frontal mass of brain 07/03/2021   Seizure ([place]) 10/28/2020   Primary malignant neoplasm of frontal lobe ([place]) 01/13/2020   Focal seizure ([place]) 12/07/2019   Astrocytoma of frontal lobe ([place]) 12/07/2019   Past Medical History:  Diagnosis Date   Brain tumor [facility])    Complication of anesthesia 2005   trouble waking   Headache    Seizure ([place]) 2020   UTI (urinary tract infection)     Past Surgical History:  Procedure Laterality Date   ABDOMINAL SURGERY     APPLICATION OF CRANIAL NAVIGATION Left 12/08/2019   Procedure: APPLICATION OF CRANIAL NAVIGATION;  Surgeon: Vallarie Mare, MD;  Location: [place];  Service: Neurosurgery;  Laterality: Left;  APPLICATION OF CRANIAL NAVIGATION   APPLICATION OF CRANIAL NAVIGATION Left 07/03/2021   Procedure: APPLICATION OF CRANIAL NAVIGATION;  Surgeon: Vallarie Mare, MD;  Location: [place];  Service: Neurosurgery;  Laterality: Left;   CRANIOTOMY Left 07/03/2021   Procedure: STEREOTACTIC DRAINAGE OF LEFT FRONTAL LOBE CYST;  Surgeon: Vallarie Mare, MD;  Location: [place];  Service: Neurosurgery;  Laterality: Left;   FRAMELESS  BIOPSY WITH BRAINLAB Left 12/08/2019   Procedure: Left stereotactic brain biopsy with brainlab;  Surgeon: Vallarie Mare, MD;  Location: [place];  Service: Neurosurgery;  Laterality: Left;  Left stereotactic brain biopsy with brainlab   WISDOM TOOTH EXTRACTION      Medications Prior to Admission  Medication Sig Dispense Refill Last Dose   Barberry-Oreg Grape-Goldenseal (BERBERINE COMPLEX PO) Take 2 capsules by mouth daily.   Past Week   Green Tea,  Camellia sinensis, (GREEN TEA PO) Take 725 mg by mouth daily.   Past Week   Lacosamide 150 MG TABS Take 1 tablet (150 mg total) by mouth 2 (two) times daily. 120 tablet 2 02/25/2022   levETIRAcetam (KEPPRA) 1000 MG tablet TAKE 2 TABLETS(2000 MG) BY MOUTH TWICE DAILY 180 tablet 1 02/25/2022   LYCOPENE PO Take 50 mg by mouth daily.   Past Week   MAGNESIUM MALATE PO Take 1,350 mg by mouth daily.   Past Week   Melatonin 10 MG TABS Take 60 mg by mouth at bedtime.      NP THYROID 60 MG tablet Take 60 mg by mouth every morning.   02/26/2022 at 0500   Omega-3 Fatty Acids (FISH OIL) 1000 MG CAPS Take 2,000 mg by mouth daily.   Past Week   OVER THE COUNTER MEDICATION Take 1 mL by mouth daily. [place] mushroom   Past Week   OVER THE COUNTER MEDICATION Take 1 capsule by mouth daily. METHYLATION COMPLETE supplement   02/25/2022   OVER THE COUNTER MEDICATION Take 1.25 mLs by mouth daily. baikal cap supplement   Past Week   Prasterone, DHEA, (DHEA PO) Take 1 capsule by mouth daily.   Past Week   PRESCRIPTION MEDICATION Testosterone 2% cream USE 4.1YS (TWO CLICKS) BEHIND THE KNEE DAILY, DECREASE DOSE TO ONE CLICK FOR OILY SKIN, ACNE OR HAIR GROWTH.      RESVERATROL PO Take 800 mg by mouth at bedtime.   Past Week  SPIRULINA PO Take 10 tablets by mouth 3 (three) times daily.   02/25/2022   TURMERIC PO Take 1,300 mg by mouth daily.   Past Week   Vitamin D-Vitamin K (VITAMIN K2-VITAMIN D3 PO) Take 1 tablet by mouth daily.   Past Week   ibuprofen (ADVIL) 200 MG tablet Take 400 mg by mouth every 6 (six) hours as needed for moderate pain.   More than a month   Allergies  Allergen Reactions   Trazodone And Nefazodone Itching   Amitriptyline     Severe joint pain    Chlorhexidine Itching   Penicillins Rash   Tape Rash    Clear tape   Vancomycin Itching and Rash    Infusion related (red-man); give slower    Social History   Tobacco Use   Smoking status: Never   Smokeless tobacco: Never  Substance Use  Topics   Alcohol use: Yes    Alcohol/week: 4.0 standard drinks of alcohol    Types: 4 Glasses of wine per week    Family History  Problem Relation Age of Onset   Migraines Mother    Thyroid cancer Father    Testicular cancer Father      Review of Systems Pertinent items noted in HPI and remainder of comprehensive ROS otherwise negative.  Objective:   Patient Vitals for the past 8 hrs:  BP Temp Temp src Pulse Resp SpO2 Height Weight  02/26/22 0601 109/69 98.6 F (37 C) Oral 70 18 100 % '5\' 8"'$  (1.727 m) 59.9 kg   No intake/output data recorded. No intake/output data recorded.      General : Alert, cooperative, no distress, appears stated age   Head:  Normocephalic/atraumatic    Eyes: PERRL, conjunctiva/corneas clear, EOM's intact. Fundi could not be visualized Neck: Supple Chest:  Respirations unlabored Chest wall: no tenderness or deformity Heart: Regular rate and rhythm Abdomen: Soft, nontender and nondistended Extremities: warm and well-perfused Skin: normal turgor, color and texture Neurologic:  Alert, oriented x 3.  Eyes open spontaneously. PERRL, EOMI, VFC, no facial droop. V1-3 intact.  No dysarthria, tongue protrusion symmetric.  CNII-XII intact. Word finding difficulty with scanning speech. R pronator drift       Data ReviewCBC:  Lab Results  Component Value Date   WBC 3.9 (L) 02/18/2022   RBC 4.47 02/18/2022   BMP:  Lab Results  Component Value Date   GLUCOSE 95 02/18/2022   CO2 25 02/18/2022   BUN 15 02/18/2022   CREATININE 0.93 02/18/2022   CREATININE 0.99 08/20/2020   CALCIUM 9.4 02/18/2022   Radiology review:   See clinic note  Assessment:   Active Problems:   * No active hospital problems. *   41 yo F with astrocytoma with recurrent brain cyst Plan:   - Ommaya reservoir today

## 2022-02-26 NOTE — Op Note (Signed)
Procedure(s): FRONTAL PLACEMENT OF OMMAYA RESERVOIR W/ AXIEM APPLICATION OF CRANIAL NAVIGATION Procedure Note  Candace [name] female 41 y.o. 02/26/2022  Procedure(s) and Anesthesia Type:    * FRONTAL PLACEMENT OF OMMAYA RESERVOIR W/ AXIEM - General    * APPLICATION OF CRANIAL NAVIGATION - General  Surgeon(s) and Role:    Marcello Moores, Dorcas Carrow, MD - Primary   Indications:  This is a 41 yo F with astrocytoma with cystic changes.  She had previous stereotactic drainage of large cyst which significantly improved her neurologic symptoms.  However, she began having recurrent growth of the cyst, with increased seizure frequency, speech difficulties and right-sided weakness.  After interdisciplinary discussion, treatment options were discussed with the patient.  She did not wish to proceed with an awake craniotomy for resection.  I discussed the option of palliative Ommaya reservoir placement to allow for repeated drainages of the cyst.  Risk, benefits, terms, and expected convalescence were discussed with her and her husband.  Risk discussed included but were not limited to, bleeding, pain, infection, cyst recurrence, loculation, neurologic deficit, seizure, and death.  Informed consent was obtained and she wished to proceed.     Surgeon: Vallarie Mare   Assistants: None  Anesthesia: General   Procedure Detail  FRONTAL PLACEMENT OF OMMAYA RESERVOIR W/ AXIEM, APPLICATION OF CRANIAL NAVIGATION 2.  Use of intraoperative neuronavigation with the Axiem system  The patient was brought to the operating.  General anesthesia was induced and patient was intubated by the anesthesia service.  After appropriate lines and monitors placed, patient was positioned supine with the head turned to the right.  A preoperative thin CT was reconstructed 3D image.  This was used to perform surface match registration with the Medtronic Axiom system.  Good location for the Ommaya reservoir was selected and the  scalp was clipped, preprepped with alcohol and prepped and draped in sterile fashion.  A timeout was formed and preoperative antibiotics were given.  Semilunar incision was made with a 10 blade.  Small amount of periosteum was removed and a high-speed drill was used to perform a small bur hole.  The dura was coagulated and opened in a cruciate manner and a small corticotomy was made.  Using navigated stylette, the cyst was cannulated and the reservoir was placed and tucked under the skin edges.  25-gauge needle was then used to access the reservoir approximately 30 mL of golden colored fluid was removed.  The fluid was sent for cytology.  The wound was then irrigated thoroughly.  The flange of the reservoir was secured to the underlying periosteum.  The skin was closed with 2-0 Vicryl stitches in buried interrupted fashion.  The skin was closed with staples.  A sterile dressing was placed.  Patient was then extubated by the anesthesia service.  All counts were correct at the end of surgery.  No complications were noted.   Findings: Successful Ommaya reservoir placement  Estimated Blood Loss:  Minimal         Drains: None         Total IV Fluids: See anesthesia records  Blood Given: none          Specimens: Brain cyst fluid for cytology         Implants: none        Complications:  * No complications entered in OR log *         Disposition: PACU - hemodynamically stable.         Condition:  stable

## 2022-02-26 NOTE — Anesthesia Postprocedure Evaluation (Signed)
Anesthesia Post Note  Patient: Candace [name]  Procedure(s) Performed: FRONTAL PLACEMENT OF OMMAYA CHEMO RESERVOIR (Left) APPLICATION OF CRANIAL NAVIGATION (Left)     Patient location during evaluation: PACU Anesthesia Type: General Level of consciousness: awake and alert, oriented and patient cooperative Pain management: pain level controlled Vital Signs Assessment: post-procedure vital signs reviewed and stable Respiratory status: spontaneous breathing, nonlabored ventilation and respiratory function stable Cardiovascular status: blood pressure returned to baseline and stable Postop Assessment: no apparent nausea or vomiting and adequate PO intake Anesthetic complications: no   No notable events documented.  Last Vitals:  Vitals:   02/26/22 1030 02/26/22 1100  BP: (!) 97/58 (!) 95/57  Pulse: 73 87  Resp: 11 11  Temp: 36.5 C   SpO2: 99% 98%    Last Pain:  Vitals:   02/26/22 1030  TempSrc:   PainSc: 0-No pain                 Maie Kesinger,E. Fae Blossom

## 2022-02-27 ENCOUNTER — Encounter (HOSPITAL_COMMUNITY): Payer: Self-pay | Admitting: Neurosurgery

## 2022-02-27 ENCOUNTER — Inpatient Hospital Stay (HOSPITAL_COMMUNITY): Payer: BC Managed Care – PPO

## 2022-02-27 MED ORDER — HYDROCODONE-ACETAMINOPHEN 5-325 MG PO TABS: 1.0000 | ORAL_TABLET | ORAL | 0 refills | Status: AC | PRN

## 2022-02-27 NOTE — Discharge Summary (Signed)
Physician Discharge Summary  Patient ID: Candace [name] MRN: [id number] DOB/AGE: 1980/09/27 41 y.o.  Admit date: 02/26/2022 Discharge date: 02/27/2022  Admission Diagnoses:  astrocytoma  Discharge Diagnoses:  Same Principal Problem:   Astrocytoma [facility])   Discharged Condition: Stable  Hospital Course:  Candace [name] is a 41 y.o. female who was admitted after elective placement of Ommaya reservoir into a brain tumor cyst.  She was admitted postoperatively to the ICU where she was mobilized.  She had improvement in her right-sided weakness postoperatively.  She did not have any seizures.  A postoperative CT showed good placement of her reservoir.  She is deemed ready for discharge home on 11/30.  Treatments: Surgery -placement of left frontal Ommaya reservoir  Discharge Exam: Blood pressure 112/78, pulse 84, temperature 98 F (36.7 C), temperature source Oral, resp. rate 16, height '5\' 8"'$  (1.727 m), weight 59.9 kg, last menstrual period 02/07/2022, SpO2 94 %. Awake, alert, oriented Speech scanning with mild word finding difficulty, appropriate CN grossly intact Mild right-sided pronator drift Wound c/d/i  Disposition: Discharge disposition: 01-Home or Self Care        Allergies as of 02/27/2022       Reactions   Trazodone And Nefazodone Itching   Amitriptyline    Severe joint pain    Chlorhexidine Itching   Penicillins Rash   Tape Rash   Clear tape   Vancomycin Itching, Rash   Infusion related (red-man); give slower        Medication List     TAKE these medications    BERBERINE COMPLEX PO Take 2 capsules by mouth daily.   DHEA PO Take 1 capsule by mouth daily.   Fish Oil 1000 MG Caps Take 2,000 mg by mouth daily.   GREEN TEA PO Take 725 mg by mouth daily.   HYDROcodone-acetaminophen 5-325 MG tablet Commonly known as: NORCO/VICODIN Take 1 tablet by mouth every 4 (four) hours as needed for moderate pain.   ibuprofen 200 MG  tablet Commonly known as: ADVIL Take 400 mg by mouth every 6 (six) hours as needed for moderate pain.   Lacosamide 150 MG Tabs Take 1 tablet (150 mg total) by mouth 2 (two) times daily.   levETIRAcetam 1000 MG tablet Commonly known as: KEPPRA TAKE 2 TABLETS(2000 MG) BY MOUTH TWICE DAILY   LYCOPENE PO Take 50 mg by mouth daily.   MAGNESIUM MALATE PO Take 1,350 mg by mouth daily.   Melatonin 10 MG Tabs Take 60 mg by mouth at bedtime.   NP Thyroid 60 MG tablet Generic drug: thyroid Take 60 mg by mouth every morning.   OVER THE COUNTER MEDICATION Take 1 mL by mouth daily. [place] mushroom   OVER THE COUNTER MEDICATION Take 1 capsule by mouth daily. METHYLATION COMPLETE supplement   OVER THE COUNTER MEDICATION Take 1.25 mLs by mouth daily. baikal cap supplement   PRESCRIPTION MEDICATION Testosterone 2% cream USE 5.0PT (TWO CLICKS) BEHIND THE KNEE DAILY, DECREASE DOSE TO ONE CLICK FOR OILY SKIN, ACNE OR HAIR GROWTH.   RESVERATROL PO Take 800 mg by mouth at bedtime.   SPIRULINA PO Take 10 tablets by mouth 3 (three) times daily.   TURMERIC PO Take 1,300 mg by mouth daily.   VITAMIN K2-VITAMIN D3 PO Take 1 tablet by mouth daily.        Follow-up Information     Vallarie Mare, MD Follow up in 10 day(s).   Specialty: Neurosurgery Contact information: [address] [place] [address]  [phone]                 Signed: Vallarie Mare 02/27/2022, 8:25 AM

## 2022-02-27 NOTE — Discharge Instructions (Signed)
Can remove dressing and shower with baby shampoo 72 hours after surgery Walk as much as possible No heavy lifting >10 lbs

## 2022-02-28 LAB — CYTOLOGY - NON PAP

## 2022-02-28 MED FILL — Thrombin For Soln 5000 Unit: CUTANEOUS | Qty: 2 | Status: AC

## 2022-03-03 ENCOUNTER — Other Ambulatory Visit: Payer: Self-pay | Admitting: *Deleted

## 2022-03-03 ENCOUNTER — Encounter: Payer: Self-pay | Admitting: Internal Medicine

## 2022-03-03 DIAGNOSIS — C711 Malignant neoplasm of frontal lobe: Secondary | ICD-10-CM

## 2022-03-04 ENCOUNTER — Other Ambulatory Visit: Payer: Self-pay

## 2022-03-05 ENCOUNTER — Other Ambulatory Visit: Payer: Self-pay

## 2022-03-07 ENCOUNTER — Other Ambulatory Visit: Payer: Self-pay

## 2022-03-25 ENCOUNTER — Encounter: Payer: Self-pay | Admitting: Internal Medicine

## 2022-03-25 MED ORDER — LACOSAMIDE 150 MG PO TABS: 150.0000 mg | ORAL_TABLET | Freq: Two times a day (BID) | ORAL | 2 refills | Status: DC

## 2022-03-28 ENCOUNTER — Other Ambulatory Visit (HOSPITAL_COMMUNITY): Payer: BC Managed Care – PPO

## 2022-04-01 ENCOUNTER — Ambulatory Visit: Payer: BC Managed Care – PPO | Admitting: Internal Medicine

## 2022-04-30 ENCOUNTER — Encounter: Payer: Self-pay | Admitting: Internal Medicine

## 2022-05-07 ENCOUNTER — Ambulatory Visit (HOSPITAL_COMMUNITY)
Admission: RE | Admit: 2022-05-07 | Discharge: 2022-05-07 | Disposition: A | Discharge status: Home / Self Care | Payer: BC Managed Care – PPO | Source: Ambulatory Visit | Attending: Internal Medicine | Admitting: Internal Medicine

## 2022-05-07 ENCOUNTER — Other Ambulatory Visit: Payer: Self-pay | Admitting: Internal Medicine

## 2022-05-07 DIAGNOSIS — C711 Malignant neoplasm of frontal lobe: Secondary | ICD-10-CM

## 2022-05-12 ENCOUNTER — Inpatient Hospital Stay: Payer: BC Managed Care – PPO | Attending: Internal Medicine | Admitting: Internal Medicine

## 2022-05-12 ENCOUNTER — Inpatient Hospital Stay: Payer: BC Managed Care – PPO

## 2022-05-12 ENCOUNTER — Other Ambulatory Visit: Payer: Self-pay

## 2022-05-12 VITALS — BP 123/79 | HR 63 | Temp 97.7°F | Resp 14 | Wt 139.1 lb

## 2022-05-12 DIAGNOSIS — Z808 Family history of malignant neoplasm of other organs or systems: Secondary | ICD-10-CM | POA: Insufficient documentation

## 2022-05-12 DIAGNOSIS — Z87440 Personal history of urinary (tract) infections: Secondary | ICD-10-CM | POA: Insufficient documentation

## 2022-05-12 DIAGNOSIS — Z880 Allergy status to penicillin: Secondary | ICD-10-CM | POA: Insufficient documentation

## 2022-05-12 DIAGNOSIS — Z8249 Family history of ischemic heart disease and other diseases of the circulatory system: Secondary | ICD-10-CM | POA: Diagnosis not present

## 2022-05-12 DIAGNOSIS — R200 Anesthesia of skin: Secondary | ICD-10-CM | POA: Diagnosis not present

## 2022-05-12 DIAGNOSIS — F419 Anxiety disorder, unspecified: Secondary | ICD-10-CM | POA: Insufficient documentation

## 2022-05-12 DIAGNOSIS — Z79899 Other long term (current) drug therapy: Secondary | ICD-10-CM | POA: Diagnosis not present

## 2022-05-12 DIAGNOSIS — R569 Unspecified convulsions: Secondary | ICD-10-CM | POA: Diagnosis not present

## 2022-05-12 DIAGNOSIS — Z7289 Other problems related to lifestyle: Secondary | ICD-10-CM | POA: Diagnosis not present

## 2022-05-12 DIAGNOSIS — R4781 Slurred speech: Secondary | ICD-10-CM | POA: Diagnosis not present

## 2022-05-12 DIAGNOSIS — Z8043 Family history of malignant neoplasm of testis: Secondary | ICD-10-CM | POA: Insufficient documentation

## 2022-05-12 DIAGNOSIS — Z881 Allergy status to other antibiotic agents status: Secondary | ICD-10-CM | POA: Insufficient documentation

## 2022-05-12 DIAGNOSIS — C711 Malignant neoplasm of frontal lobe: Secondary | ICD-10-CM

## 2022-05-12 NOTE — Progress Notes (Signed)
[facility] [place], [place] [address] [phone]   Interval Evaluation  Date of Service: 05/12/22 Patient Name: Candace [name] Patient MRN: [id number] Patient DOB: [birth year] Provider: Ventura Sellers, MD  Identifying Statement:  Candace [name] is a 42 y.o. female with left temporal  grade 3 astrocytoma     Oncologic History: Oncology History  Astrocytoma of frontal lobe ([place])  12/08/2019 Surgery   Stereotactic R temporal biopsy by Dr. Marcello Moores.  Path demonstrates Astrocytoma, IDH-mt, WHO grade 3   01/17/2020 - 03/02/2020 Radiation Therapy   IMRT and concurrent Temodar with Dr. Isidore Moos   07/03/2021 Surgery   Left temporal cyst aspiration with Dr. Marcello Moores; path is rare atypical cells, inflammatory cells.     Biomarkers:  MGMT Unknown.  IDH 1/2 Mutated.  EGFR Unknown  TERT Unknown   Interval History:  Candace [name] presents today for follow up after recent MRI brain.  She and her husband describe some decline in expressive language and right hand fine motor function since ommaya reservoir was placed on 02/26/22.  She continues to have sporadic seizure events, unchanged from prior.  Continues on Keppra 20662m twice per day and Vimpat 1589mtwice per day.  Remains off steroids.  Decadron 12/29/20: 62m70m1/05/22: 3mg11m+P (12/23/19) Patient presented to medical attention early in September with first ever seizure.  Event was described as "sudden onset left arm numbness" followed by "face twisting, and loss of consciousness for at least 30 minutes".  CNS imaging demonstrated a large left frontal non enhancing mass consistent with primary brain tumor.  She underwent biopsy on 12/08/19 with Dr. ThomMarcello Mooresollowing surgery she has some slurring of speech but no other significant complaints, returned to prior baseline upon discharge.  Now home, she has had no breathrough events and no focal neurologic complaints.     Medications: Current Outpatient Medications on File Prior to Visit  Medication Sig Dispense Refill   Barberry-Oreg Grape-Goldenseal (BERBERINE COMPLEX PO) Take 2 capsules by mouth daily.     Green Tea, Camellia sinensis, (GREEN TEA PO) Take 725 mg by mouth daily.     ibuprofen (ADVIL) 200 MG tablet Take 400 mg by mouth every 6 (six) hours as needed for moderate pain.     Lacosamide 150 MG TABS Take 1 tablet (150 mg total) by mouth 2 (two) times daily. 120 tablet 2   levETIRAcetam (KEPPRA) 1000 MG tablet TAKE 2 TABLETS(2000 MG) BY MOUTH TWICE DAILY 180 tablet 1   LYCOPENE PO Take 50 mg by mouth daily.     MAGNESIUM MALATE PO Take 1,350 mg by mouth daily.     Melatonin 10 MG TABS Take 60 mg by mouth at bedtime.     NP THYROID 60 MG tablet Take 60 mg by mouth every morning.     Omega-3 Fatty Acids (FISH OIL) 1000 MG CAPS Take 2,000 mg by mouth daily.     OVER THE COUNTER MEDICATION Take 1 mL by mouth daily. [place] mushroom     OVER THE COUNTER MEDICATION Take 1 capsule by mouth daily. METHYLATION COMPLETE supplement     OVER THE COUNTER MEDICATION Take 1.25 mLs by mouth daily. baikal cap supplement     Prasterone, DHEA, (DHEA PO) Take 1 capsule by mouth daily.     RESVERATROL PO Take 800 mg by mouth at bedtime.     SPIRULINA PO Take 10 tablets by mouth 3 (three) times daily.  TURMERIC PO Take 1,300 mg by mouth daily.     Vitamin D-Vitamin K (VITAMIN K2-VITAMIN D3 PO) Take 1 tablet by mouth daily.     HYDROcodone-acetaminophen (NORCO/VICODIN) 5-325 MG tablet Take 1 tablet by mouth every 4 (four) hours as needed for moderate pain. (Patient not taking: Reported on 05/12/2022) 20 tablet 0   PRESCRIPTION MEDICATION Testosterone 2% cream USE [id number] (TWO CLICKS) BEHIND THE KNEE DAILY, DECREASE DOSE TO ONE CLICK FOR OILY SKIN, ACNE OR HAIR GROWTH. (Patient not taking: Reported on 05/12/2022)     No current facility-administered medications on file prior to visit.    Allergies:  Allergies   Allergen Reactions   Trazodone And Nefazodone Itching   Amitriptyline     Severe joint pain    Chlorhexidine Itching   Penicillins Rash   Tape Rash    Clear tape   Vancomycin Itching and Rash    Infusion related (red-man); give slower   Past Medical History:  Past Medical History:  Diagnosis Date   Brain tumor ([place])    Complication of anesthesia 2005   trouble waking   Headache    Seizure ([place]) 2020   UTI (urinary tract infection)    Past Surgical History:  Past Surgical History:  Procedure Laterality Date   ABDOMINAL SURGERY     APPLICATION OF CRANIAL NAVIGATION Left 12/08/2019   Procedure: APPLICATION OF CRANIAL NAVIGATION;  Surgeon: Vallarie Mare, MD;  Location: [facility];  Service: Neurosurgery;  Laterality: Left;  APPLICATION OF CRANIAL NAVIGATION   APPLICATION OF CRANIAL NAVIGATION Left 07/03/2021   Procedure: APPLICATION OF CRANIAL NAVIGATION;  Surgeon: Vallarie Mare, MD;  Location: [facility];  Service: Neurosurgery;  Laterality: Left;   APPLICATION OF CRANIAL NAVIGATION Left 02/26/2022   Procedure: APPLICATION OF CRANIAL NAVIGATION;  Surgeon: Vallarie Mare, MD;  Location: [facility];  Service: Neurosurgery;  Laterality: Left;   CHEMO RESERVIOR INSERTION Left 02/26/2022   Procedure: FRONTAL PLACEMENT OF OMMAYA CHEMO RESERVOIR;  Surgeon: Vallarie Mare, MD;  Location: [facility];  Service: Neurosurgery;  Laterality: Left;   CRANIOTOMY Left 07/03/2021   Procedure: STEREOTACTIC DRAINAGE OF LEFT FRONTAL LOBE CYST;  Surgeon: Vallarie Mare, MD;  Location: [facility];  Service: Neurosurgery;  Laterality: Left;   FRAMELESS  BIOPSY WITH BRAINLAB Left 12/08/2019   Procedure: Left stereotactic brain biopsy with brainlab;  Surgeon: Vallarie Mare, MD;  Location: [facility];  Service: Neurosurgery;  Laterality: Left;  Left stereotactic brain biopsy with brainlab   WISDOM TOOTH EXTRACTION     Social History:  Social History   Socioeconomic History   Marital status: Married    Spouse  name: Fara Olden   Number of children: Not on file   Years of education: Not on file   Highest education level: Not on file  Occupational History   Not on file  Tobacco Use   Smoking status: Never   Smokeless tobacco: Never  Vaping Use   Vaping Use: Never used  Substance and Sexual Activity   Alcohol use: Yes    Alcohol/week: 4.0 standard drinks of alcohol    Types: 4 Glasses of wine per week   Drug use: Not Currently    Types: Marijuana   Sexual activity: Yes  Other Topics Concern   Not on file  Social History Narrative   Not on file   Social Determinants of Health   Financial Resource Strain: Not on file  Food Insecurity: Not on file  Transportation Needs: Not on file  Physical Activity: Not on file  Stress: Not on file  Social Connections: Not on file  Intimate Partner Violence: Not on file   Family History:  Family History  Problem Relation Age of Onset   Migraines Mother    Thyroid cancer Father    Testicular cancer Father     Review of Systems: Constitutional: Doesn't report fevers, chills or abnormal weight loss Eyes: Doesn't report blurriness of vision Ears, nose, mouth, throat, and face: Doesn't report sore throat Respiratory: Doesn't report cough, dyspnea or wheezes Cardiovascular: Doesn't report palpitation, chest discomfort  Gastrointestinal:  Doesn't report nausea, constipation, diarrhea GU: Doesn't report incontinence Skin: Doesn't report skin rashes Neurological: Per HPI Musculoskeletal: Doesn't report joint pain Behavioral/Psych: +anxiety  Physical Exam: Wt Readings from Last 3 Encounters:  05/12/22 139 lb 1.6 oz (63.1 kg)  02/26/22 132 lb (59.9 kg)  02/18/22 133 lb (60.3 kg)   Temp Readings from Last 3 Encounters:  05/12/22 97.7 F (36.5 C) (Oral)  02/27/22 98.9 F (37.2 C) (Axillary)  02/18/22 (!) 97.5 F (36.4 C)   BP Readings from Last 3 Encounters:  05/12/22 123/79  02/27/22 108/76  02/18/22 123/88   Pulse Readings from Last 3  Encounters:  05/12/22 63  02/27/22 73  02/18/22 67    KPS: 70. General: Alert, cooperative, pleasant, in no acute distress Head: Normal EENT: No conjunctival injection or scleral icterus.  Lungs: Resp effort normal Cardiac: Regular rate Abdomen: Non-distended abdomen Skin: No rashes cyanosis or petechiae. Extremities: No clubbing or edema  Neurologic Exam: Mental Status: Awake, alert, attentive to examiner. Oriented to self and environment. Expressive dysphasia Cranial Nerves: Visual acuity is grossly normal. Visual fields are full. Extra-ocular movements intact. No ptosis. Face is symmetric Motor: Tone and bulk are normal. Right arm 5/5 with subtly impaired fine motor coordination, R leg 4+/5. Reflexes are symmetric, no pathologic reflexes present.  Sensory: Intact to light touch Gait: Hemiparetic   Labs: I have reviewed the data as listed    Component Value Date/Time   NA 138 02/18/2022 1000   K 4.0 02/18/2022 1000   CL 104 02/18/2022 1000   CO2 25 02/18/2022 1000   GLUCOSE 95 02/18/2022 1000   BUN 15 02/18/2022 1000   CREATININE 0.93 02/18/2022 1000   CREATININE 0.99 08/20/2020 1205   CALCIUM 9.4 02/18/2022 1000   PROT 6.5 07/02/2021 1400   ALBUMIN 3.8 07/02/2021 1400   AST 19 07/02/2021 1400   AST 14 (L) 08/20/2020 1205   ALT 17 07/02/2021 1400   ALT 16 08/20/2020 1205   ALKPHOS 52 07/02/2021 1400   BILITOT 0.4 07/02/2021 1400   BILITOT 0.6 08/20/2020 1205   GFRNONAA >60 02/18/2022 1000   GFRNONAA >60 08/20/2020 1205   GFRAA >60 12/09/2019 0623   Lab Results  Component Value Date   WBC 3.9 (L) 02/18/2022   NEUTROABS 5.3 10/28/2020   HGB 14.4 02/18/2022   HCT 40.7 02/18/2022   MCV 91.1 02/18/2022   PLT 248 02/18/2022   Imaging:  [facility] Clinician Interpretation: I have personally reviewed the CNS images as listed.  My interpretation, in the context of the patient's clinical presentation, is stable disease  MR BRAIN WO CONTRAST  Result Date:  05/08/2022 CLINICAL DATA:  Astrocytoma follow-up EXAM: MRI HEAD WITHOUT CONTRAST TECHNIQUE: Multiplanar, multiecho pulse sequences of the brain and surrounding structures were obtained without intravenous contrast. COMPARISON:  01/10/2022 FINDINGS: Brain: Infiltrating mass in the left cerebral hemisphere spanning superior frontal to anterior temporal lobes with frontal cyst  which has been cannulated by an Ommaya reservoir. The cyst is decreased in size, now up to 2.3 cm as compared to 4.2 cm on prior. Local mass effect is improved. The degree of infiltrating T2 signal and especially insular/temporal cortical thickening is unchanged. Contrast was declined by the patient. No infarct, acute hemorrhage, hydrocephalus, or collection. Vascular: Normal flow voids. Skull and upper cervical spine: Normal marrow signal. Sinuses/Orbits: Negative. IMPRESSION: Infiltrating glioma in the left cerebral hemisphere with unchanged extent by T2 weighted imaging. An associated cyst has significantly decompressed since cannulation. Electronically Signed   By: Jorje Guild M.D.   On: 05/08/2022 19:41     Assessment/Plan Astrocytoma of frontal lobe ([place]) [C71.1]  Candace [name] is clinically and radiographically stable today.  MRI demonstrates contraction of cyst volume s/p ommaya placement.  Dr. Marcello Moores will con't to aspirate the collection as needed with clinical or radiographic progression.  We discussed chronic cognitive impairment from gliomatosis, radiation toxicity.  She asked about hyperbaric oxygen; although there is lack of evidence for glioma and radiation encephalopathy, we agreed to investigate this further.   We otherwise recommended continuing Keppra 2038m BID, Vimpat 1567mBID for seizure prevention.  We ask that Candace [name] to clinic in 4 months following next brain MRI, or sooner as needed.  All questions were answered. The patient knows to call the clinic with any problems, questions or  concerns. No barriers to learning were detected.  The total time spent in the encounter was 40 minutes and more than 50% was on counseling and review of test results   ZaVentura SellersMD Medical Director of Neuro-Oncology [facility]2/12/24 9:19 AM

## 2022-05-13 ENCOUNTER — Other Ambulatory Visit: Payer: Self-pay | Admitting: Radiation Therapy

## 2022-05-14 ENCOUNTER — Other Ambulatory Visit: Payer: Self-pay

## 2022-05-15 ENCOUNTER — Other Ambulatory Visit: Payer: Self-pay

## 2022-05-19 ENCOUNTER — Encounter: Payer: Self-pay | Admitting: Internal Medicine

## 2022-05-23 ENCOUNTER — Encounter: Payer: Self-pay | Admitting: Internal Medicine

## 2022-05-23 ENCOUNTER — Other Ambulatory Visit: Payer: Self-pay

## 2022-05-23 DIAGNOSIS — R569 Unspecified convulsions: Secondary | ICD-10-CM

## 2022-05-23 MED ORDER — LACOSAMIDE 150 MG PO TABS: 150.0000 mg | ORAL_TABLET | Freq: Two times a day (BID) | ORAL | 2 refills | Status: DC

## 2022-05-23 MED ORDER — LEVETIRACETAM 1000 MG PO TABS: ORAL_TABLET | ORAL | 1 refills | Status: DC

## 2022-05-27 ENCOUNTER — Encounter: Payer: Self-pay | Admitting: Internal Medicine

## 2022-05-27 MED ORDER — LACOSAMIDE 150 MG PO TABS: 150.0000 mg | ORAL_TABLET | Freq: Two times a day (BID) | ORAL | 2 refills | Status: DC

## 2022-07-25 ENCOUNTER — Encounter: Payer: Self-pay | Admitting: Internal Medicine

## 2022-07-25 DIAGNOSIS — R569 Unspecified convulsions: Secondary | ICD-10-CM

## 2022-07-25 MED ORDER — LACOSAMIDE 150 MG PO TABS: 150.0000 mg | ORAL_TABLET | Freq: Two times a day (BID) | ORAL | 2 refills | Status: DC

## 2022-08-12 ENCOUNTER — Encounter (HOSPITAL_COMMUNITY): Payer: Self-pay

## 2022-08-21 ENCOUNTER — Other Ambulatory Visit: Payer: Self-pay | Admitting: *Deleted

## 2022-08-21 ENCOUNTER — Encounter: Payer: Self-pay | Admitting: Internal Medicine

## 2022-08-21 DIAGNOSIS — R569 Unspecified convulsions: Secondary | ICD-10-CM

## 2022-08-21 MED ORDER — LEVETIRACETAM 1000 MG PO TABS: ORAL_TABLET | ORAL | 1 refills | Status: DC

## 2022-08-22 ENCOUNTER — Telehealth: Payer: Self-pay | Admitting: Internal Medicine

## 2022-08-22 NOTE — Telephone Encounter (Signed)
Called patient regarding June appointments, patient is notified. 

## 2022-09-12 ENCOUNTER — Other Ambulatory Visit: Payer: Self-pay | Admitting: Internal Medicine

## 2022-09-12 ENCOUNTER — Ambulatory Visit (HOSPITAL_COMMUNITY)
Admission: RE | Admit: 2022-09-12 | Discharge: 2022-09-12 | Disposition: A | Discharge status: Home / Self Care | Payer: BC Managed Care – PPO | Source: Ambulatory Visit | Attending: Internal Medicine | Admitting: Internal Medicine

## 2022-09-12 DIAGNOSIS — C711 Malignant neoplasm of frontal lobe: Secondary | ICD-10-CM

## 2022-09-12 DIAGNOSIS — R569 Unspecified convulsions: Secondary | ICD-10-CM

## 2022-09-15 ENCOUNTER — Inpatient Hospital Stay: Payer: BC Managed Care – PPO | Attending: Internal Medicine

## 2022-09-15 DIAGNOSIS — Z79899 Other long term (current) drug therapy: Secondary | ICD-10-CM | POA: Insufficient documentation

## 2022-09-15 DIAGNOSIS — Z8043 Family history of malignant neoplasm of testis: Secondary | ICD-10-CM | POA: Insufficient documentation

## 2022-09-15 DIAGNOSIS — R4781 Slurred speech: Secondary | ICD-10-CM | POA: Insufficient documentation

## 2022-09-15 DIAGNOSIS — F1290 Cannabis use, unspecified, uncomplicated: Secondary | ICD-10-CM | POA: Insufficient documentation

## 2022-09-15 DIAGNOSIS — R200 Anesthesia of skin: Secondary | ICD-10-CM | POA: Insufficient documentation

## 2022-09-15 DIAGNOSIS — Z87440 Personal history of urinary (tract) infections: Secondary | ICD-10-CM | POA: Insufficient documentation

## 2022-09-15 DIAGNOSIS — Z881 Allergy status to other antibiotic agents status: Secondary | ICD-10-CM | POA: Insufficient documentation

## 2022-09-15 DIAGNOSIS — Z880 Allergy status to penicillin: Secondary | ICD-10-CM | POA: Insufficient documentation

## 2022-09-15 DIAGNOSIS — Z808 Family history of malignant neoplasm of other organs or systems: Secondary | ICD-10-CM | POA: Insufficient documentation

## 2022-09-15 DIAGNOSIS — C711 Malignant neoplasm of frontal lobe: Secondary | ICD-10-CM | POA: Insufficient documentation

## 2022-09-15 DIAGNOSIS — Z7289 Other problems related to lifestyle: Secondary | ICD-10-CM | POA: Insufficient documentation

## 2022-09-15 DIAGNOSIS — Z8249 Family history of ischemic heart disease and other diseases of the circulatory system: Secondary | ICD-10-CM | POA: Insufficient documentation

## 2022-09-16 ENCOUNTER — Encounter: Payer: Self-pay | Admitting: Internal Medicine

## 2022-09-16 ENCOUNTER — Other Ambulatory Visit: Payer: Self-pay

## 2022-09-16 ENCOUNTER — Inpatient Hospital Stay: Payer: BC Managed Care – PPO | Admitting: Internal Medicine

## 2022-09-16 VITALS — BP 118/68 | HR 72 | Temp 97.9°F | Resp 20

## 2022-09-16 DIAGNOSIS — C711 Malignant neoplasm of frontal lobe: Secondary | ICD-10-CM | POA: Diagnosis present

## 2022-09-16 DIAGNOSIS — Z880 Allergy status to penicillin: Secondary | ICD-10-CM | POA: Diagnosis not present

## 2022-09-16 DIAGNOSIS — Z8249 Family history of ischemic heart disease and other diseases of the circulatory system: Secondary | ICD-10-CM | POA: Diagnosis not present

## 2022-09-16 DIAGNOSIS — Z8043 Family history of malignant neoplasm of testis: Secondary | ICD-10-CM | POA: Diagnosis not present

## 2022-09-16 DIAGNOSIS — R4781 Slurred speech: Secondary | ICD-10-CM | POA: Diagnosis not present

## 2022-09-16 DIAGNOSIS — Z808 Family history of malignant neoplasm of other organs or systems: Secondary | ICD-10-CM | POA: Diagnosis not present

## 2022-09-16 DIAGNOSIS — R569 Unspecified convulsions: Secondary | ICD-10-CM | POA: Diagnosis not present

## 2022-09-16 DIAGNOSIS — F1290 Cannabis use, unspecified, uncomplicated: Secondary | ICD-10-CM | POA: Diagnosis not present

## 2022-09-16 DIAGNOSIS — R200 Anesthesia of skin: Secondary | ICD-10-CM | POA: Diagnosis not present

## 2022-09-16 DIAGNOSIS — Z79899 Other long term (current) drug therapy: Secondary | ICD-10-CM | POA: Diagnosis not present

## 2022-09-16 DIAGNOSIS — Z7289 Other problems related to lifestyle: Secondary | ICD-10-CM | POA: Diagnosis not present

## 2022-09-16 DIAGNOSIS — Z881 Allergy status to other antibiotic agents status: Secondary | ICD-10-CM | POA: Diagnosis not present

## 2022-09-16 DIAGNOSIS — Z87440 Personal history of urinary (tract) infections: Secondary | ICD-10-CM | POA: Diagnosis not present

## 2022-09-16 NOTE — Progress Notes (Signed)
[facility] [address]  [place], [place] [address] [phone]   Interval Evaluation  Date of Service: 09/16/22 Patient Name: Candace [name] Patient MRN: [id number] Patient DOB: 06/01/1980 Provider: Henreitta Leber, MD  Identifying Statement:  Candace [name] is a 42 y.o. female with left temporal  grade 3 astrocytoma     Oncologic History: Oncology History  Astrocytoma of frontal lobe (HCC)  12/08/2019 Surgery   Stereotactic R temporal biopsy by Dr. Maisie Fus.  Path demonstrates Astrocytoma, IDH-mt, WHO grade 3   01/17/2020 - 03/02/2020 Radiation Therapy   IMRT and concurrent Temodar with Dr. Basilio Cairo   07/03/2021 Surgery   Left temporal cyst aspiration with Dr. Maisie Fus; path is rare atypical cells, inflammatory cells.     Biomarkers:  MGMT Unknown.  IDH 1/2 Mutated.  EGFR Unknown  TERT Unknown   Interval History:  Candace [name] presents today for follow up after recent MRI brain.  She describes improvement in expressive language and right hand fine motor function since beginning hyperbaric oxygen therapy sessions last month.  She continues to have sporadic seizure events, unchanged from prior.  Continues on Keppra 2000mg  twice per day and Vimpat 150mg  twice per day.  Remains off steroids.  Decadron 12/29/20: 4mg  02/02/21: 3mg   H+P (12/23/19) Patient presented to medical attention early in September with first ever seizure.  Event was described as "sudden onset left arm numbness" followed by "face twisting, and loss of consciousness for at least 30 minutes".  CNS imaging demonstrated a large left frontal non enhancing mass consistent with primary brain tumor.  She underwent biopsy on 12/08/19 with Dr. Maisie Fus.  Following surgery she has some slurring of speech but no other significant complaints, returned to prior baseline upon discharge.  Now home, she has had no breathrough events and no focal neurologic complaints.     Medications: Current Outpatient Medications on File Prior to Visit  Medication Sig Dispense Refill   Barberry-Oreg Grape-Goldenseal (BERBERINE COMPLEX PO) Take 2 capsules by mouth daily.     Green Tea, Camellia sinensis, (GREEN TEA PO) Take 725 mg by mouth daily.     HYDROcodone-acetaminophen (NORCO/VICODIN) 5-325 MG tablet Take 1 tablet by mouth every 4 (four) hours as needed for moderate pain. 20 tablet 0   ibuprofen (ADVIL) 200 MG tablet Take 400 mg by mouth every 6 (six) hours as needed for moderate pain.     Lacosamide 150 MG TABS Take 1 tablet (150 mg total) by mouth 2 (two) times daily. 120 tablet 2   levETIRAcetam (KEPPRA) 1000 MG tablet TAKE 2 TABLETS(2000 MG) BY MOUTH TWICE DAILY 180 tablet 1   LYCOPENE PO Take 50 mg by mouth daily.     MAGNESIUM MALATE PO Take 1,350 mg by mouth daily.     Melatonin 10 MG TABS Take 60 mg by mouth at bedtime.     NP THYROID 60 MG tablet Take 60 mg by mouth every morning.     Omega-3 Fatty Acids (FISH OIL) 1000 MG CAPS Take 2,000 mg by mouth daily.     OVER THE COUNTER MEDICATION Take 1 mL by mouth daily. [place] mushroom     OVER THE COUNTER MEDICATION Take 1 capsule by mouth daily. METHYLATION COMPLETE supplement     OVER THE COUNTER MEDICATION Take 1.25 mLs by mouth daily. baikal cap supplement     Prasterone, DHEA, (DHEA PO) Take 1 capsule by mouth daily.     PRESCRIPTION MEDICATION Testosterone 2% cream USE 0.5GM (  TWO CLICKS) BEHIND THE KNEE DAILY, DECREASE DOSE TO ONE CLICK FOR OILY SKIN, ACNE OR HAIR GROWTH. (Patient not taking: Reported on 05/12/2022)     RESVERATROL PO Take 800 mg by mouth at bedtime.     SPIRULINA PO Take 10 tablets by mouth 3 (three) times daily.     TURMERIC PO Take 1,300 mg by mouth daily.     Vitamin D-Vitamin K (VITAMIN K2-VITAMIN D3 PO) Take 1 tablet by mouth daily.     No current facility-administered medications on file prior to visit.    Allergies:  Allergies  Allergen Reactions   Trazodone And Nefazodone  Itching   Amitriptyline     Severe joint pain    Chlorhexidine Itching   Penicillins Rash   Tape Rash    Clear tape   Vancomycin Itching and Rash    Infusion related (red-man); give slower   Past Medical History:  Past Medical History:  Diagnosis Date   Brain tumor (HCC)    Complication of anesthesia 2005   trouble waking   Headache    Seizure (HCC) 2020   UTI (urinary tract infection)    Past Surgical History:  Past Surgical History:  Procedure Laterality Date   ABDOMINAL SURGERY     APPLICATION OF CRANIAL NAVIGATION Left 12/08/2019   Procedure: APPLICATION OF CRANIAL NAVIGATION;  Surgeon: Bedelia Person, MD;  Location: [facility] OR;  Service: Neurosurgery;  Laterality: Left;  APPLICATION OF CRANIAL NAVIGATION   APPLICATION OF CRANIAL NAVIGATION Left 07/03/2021   Procedure: APPLICATION OF CRANIAL NAVIGATION;  Surgeon: Bedelia Person, MD;  Location: [facility] OR;  Service: Neurosurgery;  Laterality: Left;   APPLICATION OF CRANIAL NAVIGATION Left 02/26/2022   Procedure: APPLICATION OF CRANIAL NAVIGATION;  Surgeon: Bedelia Person, MD;  Location: [facility] OR;  Service: Neurosurgery;  Laterality: Left;   CHEMO RESERVIOR INSERTION Left 02/26/2022   Procedure: FRONTAL PLACEMENT OF OMMAYA CHEMO RESERVOIR;  Surgeon: Bedelia Person, MD;  Location: [facility] OR;  Service: Neurosurgery;  Laterality: Left;   CRANIOTOMY Left 07/03/2021   Procedure: STEREOTACTIC DRAINAGE OF LEFT FRONTAL LOBE CYST;  Surgeon: Bedelia Person, MD;  Location: [facility] OR;  Service: Neurosurgery;  Laterality: Left;   FRAMELESS  BIOPSY WITH BRAINLAB Left 12/08/2019   Procedure: Left stereotactic brain biopsy with brainlab;  Surgeon: Bedelia Person, MD;  Location: [facility] OR;  Service: Neurosurgery;  Laterality: Left;  Left stereotactic brain biopsy with brainlab   WISDOM TOOTH EXTRACTION     Social History:  Social History   Socioeconomic History   Marital status: Married    Spouse name: Francis Dowse   Number of children: Not on file    Years of education: Not on file   Highest education level: Not on file  Occupational History   Not on file  Tobacco Use   Smoking status: Never   Smokeless tobacco: Never  Vaping Use   Vaping Use: Never used  Substance and Sexual Activity   Alcohol use: Yes    Alcohol/week: 4.0 standard drinks of alcohol    Types: 4 Glasses of wine per week   Drug use: Not Currently    Types: Marijuana   Sexual activity: Yes  Other Topics Concern   Not on file  Social History Narrative   Not on file   Social Determinants of Health   Financial Resource Strain: Not on file  Food Insecurity: Not on file  Transportation Needs: Not on file  Physical Activity: Not on file  Stress: Not on  file  Social Connections: Not on file  Intimate Partner Violence: Not on file   Family History:  Family History  Problem Relation Age of Onset   Migraines Mother    Thyroid cancer Father    Testicular cancer Father     Review of Systems: Constitutional: Doesn't report fevers, chills or abnormal weight loss Eyes: Doesn't report blurriness of vision Ears, nose, mouth, throat, and face: Doesn't report sore throat Respiratory: Doesn't report cough, dyspnea or wheezes Cardiovascular: Doesn't report palpitation, chest discomfort  Gastrointestinal:  Doesn't report nausea, constipation, diarrhea GU: Doesn't report incontinence Skin: Doesn't report skin rashes Neurological: Per HPI Musculoskeletal: Doesn't report joint pain Behavioral/Psych: +anxiety  Physical Exam: Wt Readings from Last 3 Encounters:  05/12/22 139 lb 1.6 oz (63.1 kg)  02/26/22 132 lb (59.9 kg)  02/18/22 133 lb (60.3 kg)   Temp Readings from Last 3 Encounters:  09/16/22 97.9 F (36.6 C)  05/12/22 97.7 F (36.5 C) (Oral)  02/27/22 98.9 F (37.2 C) (Axillary)   BP Readings from Last 3 Encounters:  09/16/22 118/68  05/12/22 123/79  02/27/22 108/76   Pulse Readings from Last 3 Encounters:  09/16/22 72  05/12/22 63  02/27/22 73     KPS: 70. General: Alert, cooperative, pleasant, in no acute distress Head: Normal EENT: No conjunctival injection or scleral icterus.  Lungs: Resp effort normal Cardiac: Regular rate Abdomen: Non-distended abdomen Skin: No rashes cyanosis or petechiae. Extremities: No clubbing or edema  Neurologic Exam: Mental Status: Awake, alert, attentive to examiner. Oriented to self and environment. Expressive dysphasia Cranial Nerves: Visual acuity is grossly normal. Visual fields are full. Extra-ocular movements intact. No ptosis. Face is symmetric Motor: Tone and bulk are normal. Right arm 5/5 with subtly impaired fine motor coordination, R leg 4+/5. Reflexes are symmetric, no pathologic reflexes present.  Sensory: Intact to light touch Gait: Hemiparetic   Labs: I have reviewed the data as listed    Component Value Date/Time   NA 138 02/18/2022 1000   K 4.0 02/18/2022 1000   CL 104 02/18/2022 1000   CO2 25 02/18/2022 1000   GLUCOSE 95 02/18/2022 1000   BUN 15 02/18/2022 1000   CREATININE 0.93 02/18/2022 1000   CREATININE 0.99 08/20/2020 1205   CALCIUM 9.4 02/18/2022 1000   PROT 6.5 07/02/2021 1400   ALBUMIN 3.8 07/02/2021 1400   AST 19 07/02/2021 1400   AST 14 (L) 08/20/2020 1205   ALT 17 07/02/2021 1400   ALT 16 08/20/2020 1205   ALKPHOS 52 07/02/2021 1400   BILITOT 0.4 07/02/2021 1400   BILITOT 0.6 08/20/2020 1205   GFRNONAA >60 02/18/2022 1000   GFRNONAA >60 08/20/2020 1205   GFRAA >60 12/09/2019 0623   Lab Results  Component Value Date   WBC 3.9 (L) 02/18/2022   NEUTROABS 5.3 10/28/2020   HGB 14.4 02/18/2022   HCT 40.7 02/18/2022   MCV 91.1 02/18/2022   PLT 248 02/18/2022   Imaging:  [facility] Clinician Interpretation: I have personally reviewed the CNS images as listed.  My interpretation, in the context of the patient's clinical presentation, is stable disease  No results found.   Assessment/Plan Astrocytoma of frontal lobe (HCC) [C71.1]  Quorra Herry  is clinically and radiographically stable today.  MRI demonstrates stable findings, pending official read.  Ok with outside provider ordering hyperbaric oxygen.  We otherwise recommended continuing Keppra 2000mg  BID, Vimpat 150mg  BID for seizure prevention.  We ask that Neria Thibaudeau return to clinic in 4 months following next brain  MRI, or sooner as needed.  All questions were answered. The patient knows to call the clinic with any problems, questions or concerns. No barriers to learning were detected.  The total time spent in the encounter was 40 minutes and more than 50% was on counseling and review of test results   Henreitta Leber, MD Medical Director of Neuro-Oncology [facility] [place] 09/16/22 2:58 PM

## 2022-09-20 ENCOUNTER — Other Ambulatory Visit: Payer: Self-pay

## 2022-09-21 ENCOUNTER — Encounter: Payer: Self-pay | Admitting: Internal Medicine

## 2022-11-08 ENCOUNTER — Encounter: Payer: Self-pay | Admitting: Internal Medicine

## 2022-11-08 DIAGNOSIS — R569 Unspecified convulsions: Secondary | ICD-10-CM

## 2022-11-10 MED ORDER — LEVETIRACETAM 1000 MG PO TABS: ORAL_TABLET | ORAL | 1 refills | Status: DC

## 2022-11-20 ENCOUNTER — Other Ambulatory Visit: Payer: Self-pay | Admitting: *Deleted

## 2022-11-20 ENCOUNTER — Encounter: Payer: Self-pay | Admitting: Internal Medicine

## 2022-11-20 DIAGNOSIS — R569 Unspecified convulsions: Secondary | ICD-10-CM

## 2022-11-20 MED ORDER — LACOSAMIDE 150 MG PO TABS: 150.0000 mg | ORAL_TABLET | Freq: Two times a day (BID) | ORAL | 2 refills | Status: DC

## 2023-01-07 ENCOUNTER — Encounter: Payer: Self-pay | Admitting: Internal Medicine

## 2023-01-07 ENCOUNTER — Other Ambulatory Visit: Payer: Self-pay

## 2023-01-07 DIAGNOSIS — R569 Unspecified convulsions: Secondary | ICD-10-CM

## 2023-01-07 MED ORDER — LEVETIRACETAM 1000 MG PO TABS: ORAL_TABLET | ORAL | 1 refills | Status: DC

## 2023-01-08 ENCOUNTER — Other Ambulatory Visit: Payer: Self-pay

## 2023-01-09 ENCOUNTER — Telehealth: Payer: Self-pay | Admitting: Internal Medicine

## 2023-01-09 NOTE — Telephone Encounter (Signed)
Called patient regarding upcoming October appointments, left a voicemail.  

## 2023-01-12 ENCOUNTER — Other Ambulatory Visit: Payer: Self-pay | Admitting: *Deleted

## 2023-01-12 DIAGNOSIS — C711 Malignant neoplasm of frontal lobe: Secondary | ICD-10-CM

## 2023-01-12 NOTE — Progress Notes (Signed)
Patient needs to have MRI's done at [facility] per insurance.  Patient also wanted order without contrast.  Approved per Dr Barbaraann Cao.  Orders updated.

## 2023-01-13 ENCOUNTER — Encounter: Payer: Self-pay | Admitting: Internal Medicine

## 2023-01-14 ENCOUNTER — Other Ambulatory Visit: Payer: Self-pay

## 2023-01-15 ENCOUNTER — Ambulatory Visit (HOSPITAL_COMMUNITY): Payer: BC Managed Care – PPO

## 2023-01-17 ENCOUNTER — Other Ambulatory Visit: Payer: Self-pay

## 2023-01-19 ENCOUNTER — Encounter: Payer: Self-pay | Admitting: Internal Medicine

## 2023-01-19 DIAGNOSIS — R569 Unspecified convulsions: Secondary | ICD-10-CM

## 2023-01-19 MED ORDER — LACOSAMIDE 150 MG PO TABS: 150.0000 mg | ORAL_TABLET | Freq: Two times a day (BID) | ORAL | 2 refills | Status: DC

## 2023-01-22 ENCOUNTER — Telehealth: Payer: Self-pay | Admitting: Internal Medicine

## 2023-01-22 ENCOUNTER — Ambulatory Visit: Payer: BC Managed Care – PPO | Admitting: Internal Medicine

## 2023-01-22 NOTE — Telephone Encounter (Signed)
Called patient regarding upcoming follow-up. Patient has been called and notified.

## 2023-02-02 ENCOUNTER — Encounter: Payer: Self-pay | Admitting: Internal Medicine

## 2023-02-07 ENCOUNTER — Inpatient Hospital Stay: Admission: RE | Admit: 2023-02-07 | Payer: BC Managed Care – PPO | Source: Ambulatory Visit

## 2023-02-09 ENCOUNTER — Encounter: Payer: Self-pay | Admitting: Internal Medicine

## 2023-02-10 ENCOUNTER — Other Ambulatory Visit: Payer: Self-pay

## 2023-02-12 ENCOUNTER — Ambulatory Visit: Payer: BC Managed Care – PPO | Admitting: Internal Medicine

## 2023-02-13 ENCOUNTER — Encounter: Payer: Self-pay | Admitting: Internal Medicine

## 2023-02-28 ENCOUNTER — Ambulatory Visit
Admission: RE | Admit: 2023-02-28 | Discharge: 2023-02-28 | Disposition: A | Discharge status: Home / Self Care | Payer: BC Managed Care – PPO | Source: Ambulatory Visit | Attending: Internal Medicine | Admitting: Internal Medicine

## 2023-02-28 DIAGNOSIS — C711 Malignant neoplasm of frontal lobe: Secondary | ICD-10-CM

## 2023-03-05 ENCOUNTER — Inpatient Hospital Stay: Payer: BC Managed Care – PPO | Attending: Internal Medicine | Admitting: Internal Medicine

## 2023-03-05 VITALS — BP 127/83 | HR 82 | Temp 97.7°F | Resp 20 | Wt 137.6 lb

## 2023-03-05 DIAGNOSIS — Z79890 Hormone replacement therapy (postmenopausal): Secondary | ICD-10-CM | POA: Diagnosis not present

## 2023-03-05 DIAGNOSIS — R4781 Slurred speech: Secondary | ICD-10-CM | POA: Insufficient documentation

## 2023-03-05 DIAGNOSIS — Z8043 Family history of malignant neoplasm of testis: Secondary | ICD-10-CM | POA: Insufficient documentation

## 2023-03-05 DIAGNOSIS — R569 Unspecified convulsions: Secondary | ICD-10-CM | POA: Diagnosis not present

## 2023-03-05 DIAGNOSIS — Z79899 Other long term (current) drug therapy: Secondary | ICD-10-CM | POA: Diagnosis not present

## 2023-03-05 DIAGNOSIS — Z87440 Personal history of urinary (tract) infections: Secondary | ICD-10-CM | POA: Diagnosis not present

## 2023-03-05 DIAGNOSIS — Z880 Allergy status to penicillin: Secondary | ICD-10-CM | POA: Diagnosis not present

## 2023-03-05 DIAGNOSIS — Z808 Family history of malignant neoplasm of other organs or systems: Secondary | ICD-10-CM | POA: Diagnosis not present

## 2023-03-05 DIAGNOSIS — Z8249 Family history of ischemic heart disease and other diseases of the circulatory system: Secondary | ICD-10-CM | POA: Insufficient documentation

## 2023-03-05 DIAGNOSIS — Z881 Allergy status to other antibiotic agents status: Secondary | ICD-10-CM | POA: Diagnosis not present

## 2023-03-05 DIAGNOSIS — R200 Anesthesia of skin: Secondary | ICD-10-CM | POA: Diagnosis not present

## 2023-03-05 DIAGNOSIS — C711 Malignant neoplasm of frontal lobe: Secondary | ICD-10-CM | POA: Insufficient documentation

## 2023-03-05 DIAGNOSIS — Z7289 Other problems related to lifestyle: Secondary | ICD-10-CM | POA: Insufficient documentation

## 2023-03-05 NOTE — Progress Notes (Signed)
[facility] [address]  [place], [place] [address] [phone]   Interval Evaluation  Date of Service: 03/05/23 Patient Name: Candace [name] Patient MRN: [id number] Patient DOB: 1980-04-26 Provider: Henreitta Leber, MD  Identifying Statement:  Candace [name] is a 42 y.o. female with left temporal  grade 3 astrocytoma     Oncologic History: Oncology History  Astrocytoma of frontal lobe (HCC)  12/08/2019 Surgery   Stereotactic R temporal biopsy by Dr. Maisie Fus.  Path demonstrates Astrocytoma, IDH-mt, WHO grade 3   01/17/2020 - 03/02/2020 Radiation Therapy   IMRT and concurrent Temodar with Dr. Basilio Cairo   07/03/2021 Surgery   Left temporal cyst aspiration with Dr. Maisie Fus; path is rare atypical cells, inflammatory cells.     Biomarkers:  MGMT Unknown.  IDH 1/2 Mutated.  EGFR Unknown  TERT Unknown   Interval History:  Candace [name] presents today for follow up after recent MRI brain.  She continues to undergo hyperbaric oxygen therapy sessions through her chiropractor.  She continues to have sporadic seizure events, including one this past week.  Continues on Keppra 2000mg  twice per day and Vimpat 150mg  twice per day.  Remains off steroids.  Decadron 12/29/20: 4mg  02/02/21: 3mg   H+P (12/23/19) Patient presented to medical attention early in September with first ever seizure.  Event was described as "sudden onset left arm numbness" followed by "face twisting, and loss of consciousness for at least 30 minutes".  CNS imaging demonstrated a large left frontal non enhancing mass consistent with primary brain tumor.  She underwent biopsy on 12/08/19 with Dr. Maisie Fus.  Following surgery she has some slurring of speech but no other significant complaints, returned to prior baseline upon discharge.  Now home, she has had no breathrough events and no focal neurologic complaints.    Medications: Current Outpatient Medications on File Prior to  Visit  Medication Sig Dispense Refill   Barberry-Oreg Grape-Goldenseal (BERBERINE COMPLEX PO) Take 2 capsules by mouth daily.     Green Tea, Camellia sinensis, (GREEN TEA PO) Take 725 mg by mouth daily.     ibuprofen (ADVIL) 200 MG tablet Take 400 mg by mouth every 6 (six) hours as needed for moderate pain.     Lacosamide 150 MG TABS Take 1 tablet (150 mg total) by mouth 2 (two) times daily. 120 tablet 2   levETIRAcetam (KEPPRA) 1000 MG tablet TAKE 2 TABLETS(2000 MG) BY MOUTH TWICE DAILY 180 tablet 1   LYCOPENE PO Take 50 mg by mouth daily.     MAGNESIUM MALATE PO Take 1,350 mg by mouth daily.     Melatonin 10 MG TABS Take 60 mg by mouth at bedtime.     NP THYROID 60 MG tablet Take 60 mg by mouth every morning.     Omega-3 Fatty Acids (FISH OIL) 1000 MG CAPS Take 2,000 mg by mouth daily.     OVER THE COUNTER MEDICATION Take 1 mL by mouth daily. [place] mushroom     OVER THE COUNTER MEDICATION Take 1 capsule by mouth daily. METHYLATION COMPLETE supplement     OVER THE COUNTER MEDICATION Take 1.25 mLs by mouth daily. baikal cap supplement     Prasterone, DHEA, (DHEA PO) Take 1 capsule by mouth daily.     PRESCRIPTION MEDICATION Testosterone 2% cream USE 0.5GM (TWO CLICKS) BEHIND THE KNEE DAILY, DECREASE DOSE TO ONE CLICK FOR OILY SKIN, ACNE OR HAIR GROWTH. (Patient not taking: Reported on 05/12/2022)     RESVERATROL PO Take  800 mg by mouth at bedtime.     SPIRULINA PO Take 10 tablets by mouth 3 (three) times daily.     TURMERIC PO Take 1,300 mg by mouth daily.     Vitamin D-Vitamin K (VITAMIN K2-VITAMIN D3 PO) Take 1 tablet by mouth daily.     No current facility-administered medications on file prior to visit.    Allergies:  Allergies  Allergen Reactions   Trazodone And Nefazodone Itching   Amitriptyline     Severe joint pain    Chlorhexidine Itching   Penicillins Rash   Tape Rash    Clear tape   Vancomycin Itching and Rash    Infusion related (red-man); give slower   Past  Medical History:  Past Medical History:  Diagnosis Date   Brain tumor (HCC)    Complication of anesthesia 2005   trouble waking   Headache    Seizure (HCC) 2020   UTI (urinary tract infection)    Past Surgical History:  Past Surgical History:  Procedure Laterality Date   ABDOMINAL SURGERY     APPLICATION OF CRANIAL NAVIGATION Left 12/08/2019   Procedure: APPLICATION OF CRANIAL NAVIGATION;  Surgeon: Bedelia Person, MD;  Location: [facility] OR;  Service: Neurosurgery;  Laterality: Left;  APPLICATION OF CRANIAL NAVIGATION   APPLICATION OF CRANIAL NAVIGATION Left 07/03/2021   Procedure: APPLICATION OF CRANIAL NAVIGATION;  Surgeon: Bedelia Person, MD;  Location: [facility] OR;  Service: Neurosurgery;  Laterality: Left;   APPLICATION OF CRANIAL NAVIGATION Left 02/26/2022   Procedure: APPLICATION OF CRANIAL NAVIGATION;  Surgeon: Bedelia Person, MD;  Location: [facility] OR;  Service: Neurosurgery;  Laterality: Left;   CHEMO RESERVIOR INSERTION Left 02/26/2022   Procedure: FRONTAL PLACEMENT OF OMMAYA CHEMO RESERVOIR;  Surgeon: Bedelia Person, MD;  Location: [facility] OR;  Service: Neurosurgery;  Laterality: Left;   CRANIOTOMY Left 07/03/2021   Procedure: STEREOTACTIC DRAINAGE OF LEFT FRONTAL LOBE CYST;  Surgeon: Bedelia Person, MD;  Location: [facility] OR;  Service: Neurosurgery;  Laterality: Left;   FRAMELESS  BIOPSY WITH BRAINLAB Left 12/08/2019   Procedure: Left stereotactic brain biopsy with brainlab;  Surgeon: Bedelia Person, MD;  Location: [facility] OR;  Service: Neurosurgery;  Laterality: Left;  Left stereotactic brain biopsy with brainlab   WISDOM TOOTH EXTRACTION     Social History:  Social History   Socioeconomic History   Marital status: Married    Spouse name: Candace [name]   Number of children: Not on file   Years of education: Not on file   Highest education level: Not on file  Occupational History   Not on file  Tobacco Use   Smoking status: Never   Smokeless tobacco: Never  Vaping Use   Vaping status:  Never Used  Substance and Sexual Activity   Alcohol use: Yes    Alcohol/week: 4.0 standard drinks of alcohol    Types: 4 Glasses of wine per week   Drug use: Not Currently    Types: Marijuana   Sexual activity: Yes  Other Topics Concern   Not on file  Social History Narrative   Not on file   Social Determinants of Health   Financial Resource Strain: Not on file  Food Insecurity: Not on file  Transportation Needs: Not on file  Physical Activity: Not on file  Stress: Not on file  Social Connections: Not on file  Intimate Partner Violence: Not on file   Family History:  Family History  Problem Relation Age of Onset   Migraines Mother  Thyroid cancer Father    Testicular cancer Father     Review of Systems: Constitutional: Doesn't report fevers, chills or abnormal weight loss Eyes: Doesn't report blurriness of vision Ears, nose, mouth, throat, and face: Doesn't report sore throat Respiratory: Doesn't report cough, dyspnea or wheezes Cardiovascular: Doesn't report palpitation, chest discomfort  Gastrointestinal:  Doesn't report nausea, constipation, diarrhea GU: Doesn't report incontinence Skin: Doesn't report skin rashes Neurological: Per HPI Musculoskeletal: Doesn't report joint pain Behavioral/Psych: +anxiety  Physical Exam: Wt Readings from Last 3 Encounters:  03/05/23 137 lb 9.6 oz (62.4 kg)  05/12/22 139 lb 1.6 oz (63.1 kg)  02/26/22 132 lb (59.9 kg)   Temp Readings from Last 3 Encounters:  03/05/23 97.7 F (36.5 C)  09/16/22 97.9 F (36.6 C)  05/12/22 97.7 F (36.5 C) (Oral)   BP Readings from Last 3 Encounters:  03/05/23 127/83  09/16/22 118/68  05/12/22 123/79   Pulse Readings from Last 3 Encounters:  03/05/23 82  09/16/22 72  05/12/22 63    KPS: 70. General: Alert, cooperative, pleasant, in no acute distress Head: Normal EENT: No conjunctival injection or scleral icterus.  Lungs: Resp effort normal Cardiac: Regular rate Abdomen:  Non-distended abdomen Skin: No rashes cyanosis or petechiae. Extremities: No clubbing or edema  Neurologic Exam: Mental Status: Awake, alert, attentive to examiner. Oriented to self and environment. Expressive dysphasia Cranial Nerves: Visual acuity is grossly normal. Visual fields are full. Extra-ocular movements intact. No ptosis. Face is symmetric Motor: Tone and bulk are normal. Right arm 5/5 with subtly impaired fine motor coordination, R leg 4+/5. Reflexes are symmetric, no pathologic reflexes present.  Sensory: Intact to light touch Gait: Hemiparetic   Labs: I have reviewed the data as listed    Component Value Date/Time   NA 138 02/18/2022 1000   K 4.0 02/18/2022 1000   CL 104 02/18/2022 1000   CO2 25 02/18/2022 1000   GLUCOSE 95 02/18/2022 1000   BUN 15 02/18/2022 1000   CREATININE 0.93 02/18/2022 1000   CREATININE 0.99 08/20/2020 1205   CALCIUM 9.4 02/18/2022 1000   PROT 6.5 07/02/2021 1400   ALBUMIN 3.8 07/02/2021 1400   AST 19 07/02/2021 1400   AST 14 (L) 08/20/2020 1205   ALT 17 07/02/2021 1400   ALT 16 08/20/2020 1205   ALKPHOS 52 07/02/2021 1400   BILITOT 0.4 07/02/2021 1400   BILITOT 0.6 08/20/2020 1205   GFRNONAA >60 02/18/2022 1000   GFRNONAA >60 08/20/2020 1205   GFRAA >60 12/09/2019 0623   Lab Results  Component Value Date   WBC 3.9 (L) 02/18/2022   NEUTROABS 5.3 10/28/2020   HGB 14.4 02/18/2022   HCT 40.7 02/18/2022   MCV 91.1 02/18/2022   PLT 248 02/18/2022   Imaging:  [facility] Clinician Interpretation: I have personally reviewed the CNS images as listed.  My interpretation, in the context of the patient's clinical presentation, is stable disease  MR Brain Wo Contrast  Result Date: 02/28/2023 CLINICAL DATA:  Provided history: Brain/CNS neoplasm, assess treatment response. Astrocytoma of frontal lobe. EXAM: MRI HEAD WITHOUT CONTRAST TECHNIQUE: Multiplanar, multiecho pulse sequences of the brain and surrounding structures were obtained without  intravenous contrast. COMPARISON:  Prior brain MRI examinations 09/12/2022 and earlier. FINDINGS: Brain: Infiltrative T2 FLAIR hyperintense mass within the left cerebral hemisphere involving the cortex and subcortical white matter of the left frontal lobe, left insula and left temporal lobe, as well as the left parietal/periatrial white matter, unchanged in extent from the prior brain MRI of  09/12/2022. No progressive mass effect. No midline shift. The cystic component within the left frontal lobe (with traversing Ommaya reservoir catheter) is also unchanged in size, measuring 2.9 x 1.9 x 3.0 cm (remeasured on prior). Chronic blood products again noted about the cystic component of the mass. Right frontal lobe developmental venous anomaly (with a small amount of adjacent chronic hemosiderin staining) again noted. There is no acute infarct. No extra-axial fluid collection. No midline shift. Vascular: Maintained flow voids within the proximal large arterial vessels. Skull and upper cervical spine: No focal worrisome marrow lesion. Sinuses/Orbits: No mass or acute finding within the imaged orbits. 5 mm mucous retention cyst, and moderate-sized fluid level, within the right maxillary sinus. Small fluid level within the left maxillary sinus. IMPRESSION: 1. Non-contrast examination demonstrating stable extent of an infiltrative tumor within the left cerebral hemisphere as compared to the prior brain MRI of 09/12/2022. The cystic component of the mass (with traversing Ommaya reservoir catheter) is also unchanged. No progressive mass effect. No midline shift. 2. Paranasal sinus disease as described. Electronically Signed   By: Jackey Loge D.O.   On: 02/28/2023 10:26     Assessment/Plan Astrocytoma of frontal lobe (HCC) [C71.1]  Candace [name] is clinically and radiographically stable today.  MRI demonstrates stable findings, maybe some contraction of T2/FLAIR signal noted over the past year.  Ok with continuing  hyperbaric oxygen.  We otherwise recommended continuing Keppra 2000mg  BID, Vimpat 150mg  BID for seizure prevention.  We ask that Candace [name] return to clinic in 6 months following next brain MRI, or sooner as needed.  All questions were answered. The patient knows to call the clinic with any problems, questions or concerns. No barriers to learning were detected.  The total time spent in the encounter was 40 minutes and more than 50% was on counseling and review of test results   Henreitta Leber, MD Medical Director of Neuro-Oncology [facility] 03/05/23 12:40 PM

## 2023-03-06 NOTE — Telephone Encounter (Signed)
Called and scheduled  Candace [name] (patient) six month follow up.

## 2023-03-07 ENCOUNTER — Other Ambulatory Visit: Payer: Self-pay

## 2023-03-16 ENCOUNTER — Other Ambulatory Visit: Payer: Self-pay | Admitting: *Deleted

## 2023-03-16 DIAGNOSIS — C711 Malignant neoplasm of frontal lobe: Secondary | ICD-10-CM

## 2023-03-18 ENCOUNTER — Encounter: Payer: Self-pay | Admitting: Internal Medicine

## 2023-04-04 ENCOUNTER — Other Ambulatory Visit: Payer: Self-pay | Admitting: Internal Medicine

## 2023-04-04 DIAGNOSIS — R569 Unspecified convulsions: Secondary | ICD-10-CM

## 2023-04-06 ENCOUNTER — Encounter: Payer: Self-pay | Admitting: Internal Medicine

## 2023-04-23 ENCOUNTER — Other Ambulatory Visit: Payer: Self-pay

## 2023-07-07 ENCOUNTER — Other Ambulatory Visit: Payer: Self-pay | Admitting: Internal Medicine

## 2023-07-07 ENCOUNTER — Encounter: Payer: Self-pay | Admitting: Internal Medicine

## 2023-07-07 DIAGNOSIS — R569 Unspecified convulsions: Secondary | ICD-10-CM

## 2023-07-09 ENCOUNTER — Encounter: Payer: Self-pay | Admitting: Internal Medicine

## 2023-07-09 MED ORDER — LACOSAMIDE 150 MG PO TABS: 150.0000 mg | ORAL_TABLET | Freq: Two times a day (BID) | ORAL | 2 refills | Status: DC

## 2023-08-17 ENCOUNTER — Encounter: Payer: Self-pay | Admitting: Internal Medicine

## 2023-08-27 ENCOUNTER — Ambulatory Visit
Admission: RE | Admit: 2023-08-27 | Discharge: 2023-08-27 | Disposition: A | Discharge status: Home / Self Care | Source: Ambulatory Visit | Attending: Internal Medicine | Admitting: Internal Medicine

## 2023-08-27 DIAGNOSIS — C711 Malignant neoplasm of frontal lobe: Secondary | ICD-10-CM

## 2023-08-31 ENCOUNTER — Encounter: Payer: Self-pay | Admitting: Internal Medicine

## 2023-09-02 ENCOUNTER — Other Ambulatory Visit: Payer: Self-pay

## 2023-09-03 ENCOUNTER — Inpatient Hospital Stay: Attending: Internal Medicine | Admitting: Internal Medicine

## 2023-09-03 ENCOUNTER — Other Ambulatory Visit: Payer: BC Managed Care – PPO

## 2023-09-03 VITALS — BP 114/72 | HR 70 | Temp 97.9°F | Resp 20 | Wt 137.5 lb

## 2023-09-03 DIAGNOSIS — Z8249 Family history of ischemic heart disease and other diseases of the circulatory system: Secondary | ICD-10-CM | POA: Insufficient documentation

## 2023-09-03 DIAGNOSIS — Z7289 Other problems related to lifestyle: Secondary | ICD-10-CM | POA: Insufficient documentation

## 2023-09-03 DIAGNOSIS — Z8043 Family history of malignant neoplasm of testis: Secondary | ICD-10-CM | POA: Insufficient documentation

## 2023-09-03 DIAGNOSIS — Z808 Family history of malignant neoplasm of other organs or systems: Secondary | ICD-10-CM | POA: Diagnosis not present

## 2023-09-03 DIAGNOSIS — Z881 Allergy status to other antibiotic agents status: Secondary | ICD-10-CM | POA: Diagnosis not present

## 2023-09-03 DIAGNOSIS — Z87440 Personal history of urinary (tract) infections: Secondary | ICD-10-CM | POA: Insufficient documentation

## 2023-09-03 DIAGNOSIS — Z9889 Other specified postprocedural states: Secondary | ICD-10-CM | POA: Diagnosis not present

## 2023-09-03 DIAGNOSIS — Z79899 Other long term (current) drug therapy: Secondary | ICD-10-CM | POA: Insufficient documentation

## 2023-09-03 DIAGNOSIS — R4781 Slurred speech: Secondary | ICD-10-CM | POA: Insufficient documentation

## 2023-09-03 DIAGNOSIS — Z880 Allergy status to penicillin: Secondary | ICD-10-CM | POA: Diagnosis not present

## 2023-09-03 DIAGNOSIS — R569 Unspecified convulsions: Secondary | ICD-10-CM

## 2023-09-03 DIAGNOSIS — C711 Malignant neoplasm of frontal lobe: Secondary | ICD-10-CM | POA: Diagnosis present

## 2023-09-03 DIAGNOSIS — R200 Anesthesia of skin: Secondary | ICD-10-CM | POA: Insufficient documentation

## 2023-09-03 NOTE — Progress Notes (Signed)
 [facility] at [facility] [address]  [place], [place] [address] [phone]   Interval Evaluation  Date of Service: 09/03/23 Patient Name: Candace [name] Patient MRN: [id number] Patient DOB: Jul 30, 1980 Provider: Mamie Searles, MD  Identifying Statement:  Candace [name] is a 43 y.o. female with left temporal grade 3 astrocytoma    Oncologic History: Oncology History  Astrocytoma of frontal lobe (HCC)  12/08/2019 Surgery   Stereotactic R temporal biopsy by Dr. Andy Bannister.  Path demonstrates Astrocytoma, IDH-mt, WHO grade 3   01/17/2020 - 03/02/2020 Radiation Therapy   IMRT and concurrent Temodar  with Dr. Lurena Sally   07/03/2021 Surgery   Left temporal cyst aspiration with Dr. Andy Bannister; path is rare atypical cells, inflammatory cells.     Biomarkers:  MGMT Unknown.  IDH 1/2 Mutated.  EGFR Unknown  TERT Unknown   Interval History:  Candace [name] presents today for follow up after recent MRI brain.  She is currently undergoing chelation therapy sessions with a provider in [place].  She continues to have occasional seizure events, not changed from prior.  Continues on Keppra  2000mg  twice per day and Vimpat  150mg  twice per day.  Remains off steroids.   H+P (12/23/19) Patient presented to medical attention early in September with first ever seizure.  Event was described as "sudden onset left arm numbness" followed by "face twisting, and loss of consciousness for at least 30 minutes".  CNS imaging demonstrated a large left frontal non enhancing mass consistent with primary brain tumor.  She underwent biopsy on 12/08/19 with Dr. Andy Bannister.  Following surgery she has some slurring of speech but no other significant complaints, returned to prior baseline upon discharge.  Now home, she has had no breathrough events and no focal neurologic complaints.    Medications: Current Outpatient Medications on File Prior to Visit  Medication Sig Dispense Refill    Barberry-Oreg Grape-Goldenseal (BERBERINE COMPLEX PO) Take 2 capsules by mouth daily.     Green Tea, Camellia sinensis, (GREEN TEA PO) Take 725 mg by mouth daily.     Lacosamide  150 MG TABS Take 1 tablet (150 mg total) by mouth 2 (two) times daily. 120 tablet 2   levETIRAcetam  (KEPPRA ) 1000 MG tablet TAKE 2 TABLETS(2000 MG) BY MOUTH TWICE DAILY 180 tablet 1   LYCOPENE PO Take 50 mg by mouth daily.     MAGNESIUM  MALATE PO Take 1,350 mg by mouth daily.     Melatonin 10 MG TABS Take 60 mg by mouth at bedtime.     MILK THISTLE PO Take by mouth.     NP THYROID  60 MG tablet Take 60 mg by mouth every morning.     Omega-3 Fatty Acids (FISH OIL) 1000 MG CAPS Take 2,000 mg by mouth daily.     OVER THE COUNTER MEDICATION Take 1 mL by mouth daily. [place] mushroom     OVER THE COUNTER MEDICATION Take 1 capsule by mouth daily. METHYLATION COMPLETE supplement     OVER THE COUNTER MEDICATION Take 1.25 mLs by mouth daily. baikal cap supplement     OVER THE COUNTER MEDICATION Apple Pectin     Prasterone, DHEA, (DHEA PO) Take 1 capsule by mouth daily.     PRESCRIPTION MEDICATION Testosterone 2% cream USE 0.5GM (TWO CLICKS) BEHIND THE KNEE DAILY, DECREASE DOSE TO ONE CLICK FOR OILY SKIN, ACNE OR HAIR GROWTH.     RESVERATROL PO Take 800 mg by mouth at bedtime.     SPIRULINA PO Take 10 tablets by  mouth 3 (three) times daily.     TURMERIC PO Take 1,300 mg by mouth daily.     UNABLE TO FIND Med Name: Liver pill     Vitamin D-Vitamin K (VITAMIN K2-VITAMIN D3 PO) Take 1 tablet by mouth daily.     ibuprofen (ADVIL) 200 MG tablet Take 400 mg by mouth every 6 (six) hours as needed for moderate pain. (Patient not taking: Reported on 09/03/2023)     No current facility-administered medications on file prior to visit.    Allergies:  Allergies  Allergen Reactions   Trazodone  And Nefazodone Itching   Amitriptyline      Severe joint pain    Chlorhexidine  Itching   Penicillins Rash   Tape Rash    Clear tape    Vancomycin  Itching and Rash    Infusion related (red-man); give slower   Past Medical History:  Past Medical History:  Diagnosis Date   Brain tumor (HCC)    Complication of anesthesia 2005   trouble waking   Headache    Seizure (HCC) 2020   UTI (urinary tract infection)    Past Surgical History:  Past Surgical History:  Procedure Laterality Date   ABDOMINAL SURGERY     APPLICATION OF CRANIAL NAVIGATION Left 12/08/2019   Procedure: APPLICATION OF CRANIAL NAVIGATION;  Surgeon: Van Gelinas, MD;  [facility] OR;  Service: Neurosurgery;  Laterality: Left;  APPLICATION OF CRANIAL NAVIGATION   APPLICATION OF CRANIAL NAVIGATION Left 07/03/2021   Procedure: APPLICATION OF CRANIAL NAVIGATION;  Surgeon: Van Gelinas, MD;  Location[facility] OR;  Service: Neurosurgery;  Laterality: Left;   APPLICATION OF CRANIAL NAVIGATION Left 02/26/2022   Procedure: APPLICATION OF CRANIAL NAVIGATION;  Surgeon: Van Gelinas, MD;  Location: [facility] OR;  Service: Neurosurgery;  Laterality: Left;   CHEMO RESERVIOR INSERTION Left 02/26/2022   Procedure: FRONTAL PLACEMENT OF OMMAYA CHEMO RESERVOIR;  Surgeon: Van Gelinas, MD;  Location[facility] OR;  Service: Neurosurgery;  Laterality: Left;   CRANIOTOMY Left 07/03/2021   Procedure: STEREOTACTIC DRAINAGE OF LEFT FRONTAL LOBE CYST;  Surgeon: Van Gelinas, MD;  Location: [facility] OR;  Service: Neurosurgery;  Laterality: Left;   FRAMELESS  BIOPSY WITH BRAINLAB Left 12/08/2019   Procedure: Left stereotactic brain biopsy with brainlab;  Surgeon: Van Gelinas, MD;  Location: [facility] OR;  Service: Neurosurgery;  Laterality: Left;  Left stereotactic brain biopsy with brainlab   WISDOM TOOTH EXTRACTION     Social History:  Social History   Socioeconomic History   Marital status: Married    Spouse name: Cherise Cornelia   Number of children: Not on file   Years of education: Not on file   Highest education level: Not on file  Occupational History   Not on file  Tobacco Use    Smoking status: Never   Smokeless tobacco: Never  Vaping Use   Vaping status: Never Used  Substance and Sexual Activity   Alcohol use: Yes    Alcohol/week: 4.0 standard drinks of alcohol    Types: 4 Glasses of wine per week   Drug use: Not Currently    Types: Marijuana   Sexual activity: Yes  Other Topics Concern   Not on file  Social History Narrative   Not on file   Social Drivers of Health   Financial Resource Strain: Not on file  Food Insecurity: Not on file  Transportation Needs: Not on file  Physical Activity: Not on file  Stress: Not on file  Social Connections: Not on file  Intimate Partner Violence: Not on file   Family History:  Family History  Problem Relation Age of Onset   Migraines Mother    Thyroid  cancer Father    Testicular cancer Father     Review of Systems: Constitutional: Doesn't report fevers, chills or abnormal weight loss Eyes: Doesn't report blurriness of vision Ears, nose, mouth, throat, and face: Doesn't report sore throat Respiratory: Doesn't report cough, dyspnea or wheezes Cardiovascular: Doesn't report palpitation, chest discomfort  Gastrointestinal:  Doesn't report nausea, constipation, diarrhea GU: Doesn't report incontinence Skin: Doesn't report skin rashes Neurological: Per HPI Musculoskeletal: Doesn't report joint pain Behavioral/Psych: +anxiety  Physical Exam: Wt Readings from Last 3 Encounters:  09/03/23 137 lb 8 oz (62.4 kg)  03/05/23 137 lb 9.6 oz (62.4 kg)  05/12/22 139 lb 1.6 oz (63.1 kg)   Temp Readings from Last 3 Encounters:  09/03/23 97.9 F (36.6 C)  03/05/23 97.7 F (36.5 C)  09/16/22 97.9 F (36.6 C)   BP Readings from Last 3 Encounters:  09/03/23 114/72  03/05/23 127/83  09/16/22 118/68   Pulse Readings from Last 3 Encounters:  09/03/23 70  03/05/23 82  09/16/22 72    KPS: 70. General: Alert, cooperative, pleasant, in no acute distress Head: Normal EENT: No conjunctival injection or scleral  icterus.  Lungs: Resp effort normal Cardiac: Regular rate Abdomen: Non-distended abdomen Skin: No rashes cyanosis or petechiae. Extremities: No clubbing or edema  Neurologic Exam: Mental Status: Awake, alert, attentive to examiner. Oriented to self and environment. Expressive dysphasia Cranial Nerves: Visual acuity is grossly normal. Visual fields are full. Extra-ocular movements intact. No ptosis. Face is symmetric Motor: Tone and bulk are normal. Right arm 5/5 with subtly impaired fine motor coordination, R leg 4+/5. Reflexes are symmetric, no pathologic reflexes present.  Sensory: Intact to light touch Gait: Hemiparetic   Labs: I have reviewed the data as listed    Component Value Date/Time   NA 138 02/18/2022 1000   K 4.0 02/18/2022 1000   CL 104 02/18/2022 1000   CO2 25 02/18/2022 1000   GLUCOSE 95 02/18/2022 1000   BUN 15 02/18/2022 1000   CREATININE 0.93 02/18/2022 1000   CREATININE 0.99 08/20/2020 1205   CALCIUM 9.4 02/18/2022 1000   PROT 6.5 07/02/2021 1400   ALBUMIN 3.8 07/02/2021 1400   AST 19 07/02/2021 1400   AST 14 (L) 08/20/2020 1205   ALT 17 07/02/2021 1400   ALT 16 08/20/2020 1205   ALKPHOS 52 07/02/2021 1400   BILITOT 0.4 07/02/2021 1400   BILITOT 0.6 08/20/2020 1205   GFRNONAA >60 02/18/2022 1000   GFRNONAA >60 08/20/2020 1205   GFRAA >60 12/09/2019 0623   Lab Results  Component Value Date   WBC 3.9 (L) 02/18/2022   NEUTROABS 5.3 10/28/2020   HGB 14.4 02/18/2022   HCT 40.7 02/18/2022   MCV 91.1 02/18/2022   PLT 248 02/18/2022   Imaging:  [facility] Clinician Interpretation: I have personally reviewed the CNS images as listed.  My interpretation, in the context of the patient's clinical presentation, is stable disease  MR Brain Wo Contrast Result Date: 08/27/2023 CLINICAL DATA:  Frontal lobe astrocytoma EXAM: MRI HEAD WITHOUT CONTRAST TECHNIQUE: Multiplanar, multiecho pulse sequences of the brain and surrounding structures were obtained without  intravenous contrast. COMPARISON:  02/28/2023 FINDINGS: Brain: No acute infarct, mass effect or extra-axial collection. Chronic blood products surrounding the cavity in the left frontal lobe. Chronic microhemorrhage in the right frontal lobe. Unchanged region of hyperintense T2-weighted signal within  the left frontal white matter that extends into the left insula and anterior left temporal lobe. Unchanged cystic component of the mass. The midline structures are normal. Vascular: Normal flow voids. Skull and upper cervical spine: Reservoir and catheter at the anterior left parietal calvarium Sinuses/Orbits:No paranasal sinus fluid levels or advanced mucosal thickening. No mastoid or middle ear effusion. Normal orbits. IMPRESSION: Unchanged appearance of infiltrative left frontal lobe mass with cystic component Electronically Signed   By: Juanetta Nordmann M.D.   On: 08/27/2023 23:32     Assessment/Plan Astrocytoma of frontal lobe (HCC) [C71.1]  Candace [name] is clinically and radiographically stable today.  No new or progressive deficits. MRI demonstrates stable findings overall.  We otherwise recommended continuing Keppra  2000mg  BID, Vimpat  150mg  BID for seizure prevention.  We ask that Candace [name] return to clinic in 6 months following next brain MRI, or sooner as needed.  All questions were answered. The patient knows to call the clinic with any problems, questions or concerns. No barriers to learning were detected.  The total time spent in the encounter was 40 minutes and more than 50% was on counseling and review of test results   Mamie Searles, MD Medical Director of Neuro-Oncology [facility] 09/03/23 11:09 AM

## 2023-09-06 ENCOUNTER — Other Ambulatory Visit: Payer: Self-pay

## 2023-09-08 ENCOUNTER — Ambulatory Visit: Payer: BC Managed Care – PPO | Admitting: Internal Medicine

## 2023-09-08 ENCOUNTER — Other Ambulatory Visit: Payer: BC Managed Care – PPO

## 2023-09-09 ENCOUNTER — Encounter: Payer: Self-pay | Admitting: Internal Medicine

## 2023-09-23 ENCOUNTER — Ambulatory Visit: Admitting: Internal Medicine

## 2023-09-23 ENCOUNTER — Encounter: Payer: Self-pay | Admitting: Internal Medicine

## 2023-09-23 ENCOUNTER — Other Ambulatory Visit (INDEPENDENT_AMBULATORY_CARE_PROVIDER_SITE_OTHER)

## 2023-09-23 ENCOUNTER — Ambulatory Visit: Payer: Self-pay | Admitting: Internal Medicine

## 2023-09-23 VITALS — BP 102/70 | HR 58 | Ht 68.0 in | Wt 134.0 lb

## 2023-09-23 DIAGNOSIS — B009 Herpesviral infection, unspecified: Secondary | ICD-10-CM

## 2023-09-23 DIAGNOSIS — Z8669 Personal history of other diseases of the nervous system and sense organs: Secondary | ICD-10-CM

## 2023-09-23 DIAGNOSIS — C719 Malignant neoplasm of brain, unspecified: Secondary | ICD-10-CM

## 2023-09-23 LAB — CBC WITH DIFFERENTIAL/PLATELET
Basophils Absolute: 0 10*3/uL (ref 0.0–0.1)
Basophils Relative: 0.5 % (ref 0.0–3.0)
Eosinophils Absolute: 0 10*3/uL (ref 0.0–0.7)
Eosinophils Relative: 0.1 % (ref 0.0–5.0)
HCT: 42.8 % (ref 36.0–46.0)
Hemoglobin: 14.9 g/dL (ref 12.0–15.0)
Lymphocytes Relative: 15.2 % (ref 12.0–46.0)
Lymphs Abs: 1 10*3/uL (ref 0.7–4.0)
MCHC: 34.7 g/dL (ref 30.0–36.0)
MCV: 91 fl (ref 78.0–100.0)
Monocytes Absolute: 0.3 10*3/uL (ref 0.1–1.0)
Monocytes Relative: 4.5 % (ref 3.0–12.0)
Neutro Abs: 5 10*3/uL (ref 1.4–7.7)
Neutrophils Relative %: 79.7 % — ABNORMAL HIGH (ref 43.0–77.0)
Platelets: 230 10*3/uL (ref 150.0–400.0)
RBC: 4.71 Mil/uL (ref 3.87–5.11)
RDW: 11.8 % (ref 11.5–15.5)
WBC: 6.3 10*3/uL (ref 4.0–10.5)

## 2023-09-23 LAB — COMPREHENSIVE METABOLIC PANEL WITH GFR
ALT: 15 U/L (ref 0–35)
AST: 16 U/L (ref 0–37)
Albumin: 4.8 g/dL (ref 3.5–5.2)
Alkaline Phosphatase: 52 U/L (ref 39–117)
BUN: 15 mg/dL (ref 6–23)
CO2: 23 meq/L (ref 19–32)
Calcium: 9.4 mg/dL (ref 8.4–10.5)
Chloride: 103 meq/L (ref 96–112)
Creatinine, Ser: 0.89 mg/dL (ref 0.40–1.20)
GFR: 80.55 mL/min (ref >60.00)
Glucose, Bld: 87 mg/dL (ref 70–99)
Potassium: 4.1 meq/L (ref 3.5–5.1)
Sodium: 139 meq/L (ref 135–145)
Total Bilirubin: 0.7 mg/dL (ref 0.2–1.2)
Total Protein: 7.5 g/dL (ref 6.0–8.3)

## 2023-09-23 LAB — PROTIME-INR
INR: 1 ratio (ref 0.8–1.0)
Prothrombin Time: 10.9 s (ref 9.6–13.1)

## 2023-09-23 NOTE — Progress Notes (Signed)
 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  Subsequently treated with radiation therapy then IMRT with concurrent Temodar .  Has been under surveillance with oncology.  Currently receiving chelation therapy in [place].  Last seen by oncology September 03, 2023 (Dr. Buckley), reviewed.  The patient scheduled this appointment today concerned that she may have liver disease.  Prior to her office visit she prepared a 4 page typed letter outlining part of her medical history, clinical concerns, and why she is concerned about liver disease.  In short, patient states that she was diagnosed with herpes simplex virus 1 utero.  She developed HSV related eye infection in her 30s.  And notes that herpetic infections have been associated with brain cancer.  Next, she reports issues with rash, fluid retention and cysts.  She feels these are signs of liver disease.  She goes on to discuss a number of other concerns/associations/theories that are not relevant to this visit.  The patient denies a family history of liver disease.  No personal history of hepatitis.  She has 3 or less alcoholic beverages per week.  Laboratories: Most recent laboratories April 2023.  Unremarkable comprehensive metabolic panel and CBC at that time.  REVIEW OF SYSTEMS:  All non-GI ROS negative except for back pain, fever, headaches, itching, menstrual pain, night sweats, nosebleeds, skin rash, sleeping problems, ankle swelling  Past Medical History:  Diagnosis Date   Brain tumor [facility])    Complication of anesthesia 2005   trouble waking   Headache    Seizure (HCC) 2020   UTI (urinary tract infection)     Past Surgical History:  Procedure Laterality Date   ABDOMINAL SURGERY     APPLICATION OF CRANIAL NAVIGATION Left 12/08/2019   Procedure: APPLICATION OF CRANIAL NAVIGATION;  Surgeon: Debby Dorn MATSU, MD;  Location: [facility] OR;  Service: Neurosurgery;  Laterality: Left;  APPLICATION OF CRANIAL NAVIGATION   APPLICATION OF CRANIAL NAVIGATION Left 07/03/2021   Procedure: APPLICATION OF CRANIAL NAVIGATION;  Surgeon: Debby Dorn MATSU, MD;  Location: [facility] OR;  Service: Neurosurgery;  Laterality: Left;   APPLICATION OF CRANIAL NAVIGATION Left 02/26/2022   Procedure: APPLICATION OF CRANIAL NAVIGATION;  Surgeon: Debby Dorn MATSU, MD;  Location: [facility] OR;  Service: Neurosurgery;  Laterality: Left;   CHEMO RESERVIOR INSERTION Left 02/26/2022   Procedure: FRONTAL PLACEMENT OF OMMAYA CHEMO RESERVOIR;  Surgeon: Debby Dorn MATSU, MD;  Location: [facility] OR;  Service: Neurosurgery;  Laterality: Left;   CRANIOTOMY Left 07/03/2021   Procedure: STEREOTACTIC DRAINAGE OF LEFT FRONTAL LOBE CYST;  Surgeon: Debby Dorn MATSU, MD;  Location: [facility] OR;  Service: Neurosurgery;  Laterality: Left;   FRAMELESS  BIOPSY WITH BRAINLAB Left 12/08/2019   Procedure: Left stereotactic brain biopsy with brainlab;  Surgeon: Debby Dorn MATSU, MD;  Location: [facility] OR;  Service: Neurosurgery;  Laterality: Left;  Left stereotactic brain biopsy with brainlab   WISDOM TOOTH EXTRACTION      Social History Ruey Storer  reports that she has never smoked. She has never used smokeless tobacco. She reports current alcohol use of about 4.0 standard drinks of alcohol per week. She reports that she does not currently use drugs after having used the following drugs: Marijuana.  family history includes Migraines in her mother; Testicular cancer in her father; Thyroid  cancer in her father.  Allergies  Allergen Reactions   Trazodone  And Nefazodone Itching   Amitriptyline   Severe joint pain    Chlorhexidine  Itching   Penicillins Rash   Tape Rash    Clear tape   Vancomycin  Itching and Rash    Infusion related (red-man); give slower       PHYSICAL EXAMINATION: Vital signs: BP 102/70   Pulse (!) 58   Ht 5' 8 (1.727 m)   Wt 134 lb (60.8 kg)    BMI 20.37 kg/m   Constitutional: Pleasant, generally well-appearing, no acute distress.  Slurred speech Psychiatric: alert and oriented x3, cooperative Eyes: extraocular movements intact, anicteric, conjunctiva pink Mouth: oral pharynx moist, no lesions Neck: supple no lymphadenopathy Cardiovascular: heart regular rate and rhythm, no murmur Lungs: clear to auscultation bilaterally Abdomen: soft, nontender, nondistended, no obvious ascites, no peritoneal signs, normal bowel sounds, no organomegaly Rectal: Omitted Extremities: no clubbing, cyanosis, or lower extremity edema bilaterally Skin: no lesions on visible extremities Neuro: No focal deficits. No asterixis.   ASSESSMENT:  1.  Patient concerns over liver disease.  No evidence of such clinically.  He has not had recent blood work or imaging to evaluate this concern further.  The likelihood of liver disease is low. 2.  Astrocytoma.  Prior treatment and current surveillance as noted above 3.  History of seizure disorder secondary to astrocytoma.  On Keppra    PLAN:  1.  Blood work today to further assess for any evidence of liver disease.  CBC, comprehensive metabolic panel, PT/INR 2.  Right upper quadrant ultrasound to evaluate hepatic architecture 3.  Ongoing general medical care with oncology A total time of 45 minutes was spent preparing to see the patient, obtaining comprehensive history, performing medically appropriate physical examination, counseling and educating the patient regarding the above listed issues, ordering  laboratories, ordering radiology study, and documenting clinical information in the health record

## 2023-09-23 NOTE — Patient Instructions (Signed)
 Your provider has requested that you go to the basement level for lab work before leaving today. Press B on the elevator. The lab is located at the first door on the left as you exit the elevator.  You have been scheduled for an abdominal ultrasound at [facility] (1st floor of hospital) on 09/25/2023 at 7:00am. Please arrive 45 minutes prior to your appointment for registration. Make certain not to have anything to eat or drink after midnight prior to your appointment. Should you need to reschedule your appointment, please contact radiology at [phone]. This test typically takes about 30 minutes to perform.   _______________________________________________________  If your blood pressure at your visit was 140/90 or greater, please contact your primary care physician to follow up on this.  _______________________________________________________  If you are age 41 or older, your body mass index should be between 23-30. Your Body mass index is 20.37 kg/m. If this is out of the aforementioned range listed, please consider follow up with your Primary Care Provider.  If you are age 93 or younger, your body mass index should be between 19-25. Your Body mass index is 20.37 kg/m. If this is out of the aformentioned range listed, please consider follow up with your Primary Care Provider.   ________________________________________________________  The  GI providers would like to encourage you to use MYCHART to communicate with providers for non-urgent requests or questions.  Due to long hold times on the telephone, sending your provider a message by [facility] may be a faster and more efficient way to get a response.  Please allow 48 business hours for a response.  Please remember that this is for non-urgent requests.  _______________________________________________________

## 2023-09-25 ENCOUNTER — Ambulatory Visit (HOSPITAL_COMMUNITY)
Admission: RE | Admit: 2023-09-25 | Discharge: 2023-09-25 | Disposition: A | Discharge status: Home / Self Care | Source: Ambulatory Visit | Attending: Internal Medicine | Admitting: Internal Medicine

## 2023-09-25 DIAGNOSIS — C719 Malignant neoplasm of brain, unspecified: Secondary | ICD-10-CM | POA: Diagnosis present

## 2023-09-30 ENCOUNTER — Other Ambulatory Visit: Payer: Self-pay | Admitting: Internal Medicine

## 2023-09-30 ENCOUNTER — Encounter: Payer: Self-pay | Admitting: Internal Medicine

## 2023-09-30 DIAGNOSIS — R569 Unspecified convulsions: Secondary | ICD-10-CM

## 2023-11-21 ENCOUNTER — Other Ambulatory Visit: Payer: Self-pay | Admitting: Internal Medicine

## 2023-11-21 DIAGNOSIS — R569 Unspecified convulsions: Secondary | ICD-10-CM

## 2023-11-23 ENCOUNTER — Encounter: Payer: Self-pay | Admitting: Internal Medicine

## 2024-01-12 ENCOUNTER — Other Ambulatory Visit: Payer: Self-pay | Admitting: Internal Medicine

## 2024-01-12 DIAGNOSIS — R569 Unspecified convulsions: Secondary | ICD-10-CM

## 2024-01-13 ENCOUNTER — Encounter: Payer: Self-pay | Admitting: Internal Medicine

## 2024-02-29 ENCOUNTER — Encounter (HOSPITAL_COMMUNITY): Payer: Self-pay | Admitting: Neurology

## 2024-02-29 ENCOUNTER — Encounter: Payer: Self-pay | Admitting: Internal Medicine

## 2024-03-03 ENCOUNTER — Other Ambulatory Visit: Payer: Self-pay | Admitting: Radiation Therapy

## 2024-03-03 ENCOUNTER — Inpatient Hospital Stay: Admission: RE | Admit: 2024-03-03 | Discharge: 2024-03-03 | Attending: Internal Medicine

## 2024-03-03 DIAGNOSIS — C711 Malignant neoplasm of frontal lobe: Secondary | ICD-10-CM

## 2024-03-07 ENCOUNTER — Inpatient Hospital Stay: Attending: Internal Medicine

## 2024-03-07 DIAGNOSIS — Z87440 Personal history of urinary (tract) infections: Secondary | ICD-10-CM | POA: Insufficient documentation

## 2024-03-07 DIAGNOSIS — Z8249 Family history of ischemic heart disease and other diseases of the circulatory system: Secondary | ICD-10-CM | POA: Insufficient documentation

## 2024-03-07 DIAGNOSIS — Z79899 Other long term (current) drug therapy: Secondary | ICD-10-CM | POA: Insufficient documentation

## 2024-03-07 DIAGNOSIS — Z880 Allergy status to penicillin: Secondary | ICD-10-CM | POA: Insufficient documentation

## 2024-03-07 DIAGNOSIS — Z881 Allergy status to other antibiotic agents status: Secondary | ICD-10-CM | POA: Insufficient documentation

## 2024-03-07 DIAGNOSIS — R609 Edema, unspecified: Secondary | ICD-10-CM | POA: Insufficient documentation

## 2024-03-07 DIAGNOSIS — Z808 Family history of malignant neoplasm of other organs or systems: Secondary | ICD-10-CM | POA: Insufficient documentation

## 2024-03-07 DIAGNOSIS — Z79890 Hormone replacement therapy (postmenopausal): Secondary | ICD-10-CM | POA: Insufficient documentation

## 2024-03-07 DIAGNOSIS — G40909 Epilepsy, unspecified, not intractable, without status epilepticus: Secondary | ICD-10-CM | POA: Insufficient documentation

## 2024-03-07 DIAGNOSIS — Z8043 Family history of malignant neoplasm of testis: Secondary | ICD-10-CM | POA: Insufficient documentation

## 2024-03-07 DIAGNOSIS — C711 Malignant neoplasm of frontal lobe: Secondary | ICD-10-CM | POA: Insufficient documentation

## 2024-03-07 DIAGNOSIS — R253 Fasciculation: Secondary | ICD-10-CM | POA: Insufficient documentation

## 2024-03-10 ENCOUNTER — Inpatient Hospital Stay: Admitting: Internal Medicine

## 2024-03-10 VITALS — BP 117/80 | HR 66 | Temp 98.2°F | Resp 16 | Ht 68.0 in | Wt 137.0 lb

## 2024-03-10 DIAGNOSIS — Z8043 Family history of malignant neoplasm of testis: Secondary | ICD-10-CM | POA: Diagnosis not present

## 2024-03-10 DIAGNOSIS — Z79890 Hormone replacement therapy (postmenopausal): Secondary | ICD-10-CM | POA: Diagnosis not present

## 2024-03-10 DIAGNOSIS — R609 Edema, unspecified: Secondary | ICD-10-CM | POA: Diagnosis not present

## 2024-03-10 DIAGNOSIS — C711 Malignant neoplasm of frontal lobe: Secondary | ICD-10-CM

## 2024-03-10 DIAGNOSIS — R569 Unspecified convulsions: Secondary | ICD-10-CM

## 2024-03-10 DIAGNOSIS — R253 Fasciculation: Secondary | ICD-10-CM | POA: Diagnosis not present

## 2024-03-10 DIAGNOSIS — Z79899 Other long term (current) drug therapy: Secondary | ICD-10-CM | POA: Diagnosis not present

## 2024-03-10 DIAGNOSIS — Z8249 Family history of ischemic heart disease and other diseases of the circulatory system: Secondary | ICD-10-CM | POA: Diagnosis not present

## 2024-03-10 DIAGNOSIS — Z880 Allergy status to penicillin: Secondary | ICD-10-CM | POA: Diagnosis not present

## 2024-03-10 DIAGNOSIS — Z808 Family history of malignant neoplasm of other organs or systems: Secondary | ICD-10-CM | POA: Diagnosis not present

## 2024-03-10 DIAGNOSIS — Z87440 Personal history of urinary (tract) infections: Secondary | ICD-10-CM | POA: Diagnosis not present

## 2024-03-10 DIAGNOSIS — Z881 Allergy status to other antibiotic agents status: Secondary | ICD-10-CM | POA: Diagnosis not present

## 2024-03-10 DIAGNOSIS — G40909 Epilepsy, unspecified, not intractable, without status epilepticus: Secondary | ICD-10-CM | POA: Diagnosis not present

## 2024-03-10 MED ORDER — DEXAMETHASONE 4 MG PO TABS: 4.0000 mg | ORAL_TABLET | Freq: Every day | ORAL | 0 refills | Status: DC

## 2024-03-10 NOTE — Progress Notes (Signed)
 [facility] at [facility] [address]  [place], [place] [address] [phone]   Interval Evaluation  Date of Service: 03/10/2024 Patient Name: Candace [name] Patient MRN: [id number] Patient DOB: 28-Apr-1980 Provider: Arthea MARLA Manns, MD  Identifying Statement:  Candace [name] is a 43 y.o. female with left temporal grade 3 astrocytoma    Oncologic History: Oncology History  Astrocytoma of frontal lobe (HCC)  12/08/2019 Surgery   Stereotactic R temporal biopsy by Dr. Debby.  Path demonstrates Astrocytoma, IDH-mt, WHO grade 3   01/17/2020 - 03/02/2020 Radiation Therapy   IMRT and concurrent Temodar  with Dr. Izell   07/03/2021 Surgery   Left temporal cyst aspiration with Dr. Debby; path is rare atypical cells, inflammatory cells.     Biomarkers:  MGMT Unknown.  IDH 1/2 Mutated.  EGFR Unknown  TERT Unknown   Interval History:  [name] presents today for follow up after recent MRI brain.  She and her husband describe overall worsening of speaking and language over the past 1-2 months.  Balance is worse, as is the function in her right hand.  Still walking independently.  Seizure frequency has increased to 3-4x per week.  Semiology remains facial twitching or disconnection for 1-2 minutes without notable post-ictal period.  She is currently undergoing monthly chelation therapy sessions with a provider in [place].  Continues on Keppra  2000mg  twice per day and Vimpat  150mg  twice per day.  Remains off steroids.  H+P (12/23/19) Patient presented to medical attention early in September with first ever seizure.  Event was described as sudden onset left arm numbness followed by face twisting, and loss of consciousness for at least 30 minutes.  CNS imaging demonstrated a large left frontal non enhancing mass consistent with primary brain tumor.  She underwent biopsy on 12/08/19 with Dr. Debby.  Following surgery she has some slurring of speech but  no other significant complaints, returned to prior baseline upon discharge.  Now home, she has had no breathrough events and no focal neurologic complaints.    Medications: Current Outpatient Medications on File Prior to Visit  Medication Sig Dispense Refill   Barberry-Oreg Grape-Goldenseal (BERBERINE COMPLEX PO) Take 2 capsules by mouth daily.     Green Tea, Camellia sinensis, (GREEN TEA PO) Take 725 mg by mouth daily.     Lacosamide  150 MG TABS TAKE 1 TABLET(150 MG) BY MOUTH TWICE DAILY 120 tablet 1   levETIRAcetam  (KEPPRA ) 1000 MG tablet TAKE 2 TABLETS(2000 MG) BY MOUTH TWICE DAILY 180 tablet 1   LYCOPENE PO Take 50 mg by mouth daily.     MAGNESIUM  MALATE PO Take 1,350 mg by mouth daily.     Melatonin 10 MG TABS Take 60 mg by mouth at bedtime.     MILK THISTLE PO Take by mouth.     NP THYROID  60 MG tablet Take 60 mg by mouth every morning.     Omega-3 Fatty Acids (FISH OIL) 1000 MG CAPS Take 2,000 mg by mouth daily.     OVER THE COUNTER MEDICATION Take 1 mL by mouth daily. [place] mushroom     OVER THE COUNTER MEDICATION Take 1 capsule by mouth daily. METHYLATION COMPLETE supplement     OVER THE COUNTER MEDICATION Take 1.25 mLs by mouth daily. baikal cap supplement     OVER THE COUNTER MEDICATION Apple Pectin     Prasterone, DHEA, (DHEA PO) Take 1 capsule by mouth daily.     RESVERATROL PO Take 800 mg by mouth at  bedtime.     SPIRULINA PO Take 10 tablets by mouth 3 (three) times daily.     TURMERIC PO Take 1,300 mg by mouth daily.     UNABLE TO FIND Med Name: Liver pill     Vitamin D-Vitamin K (VITAMIN K2-VITAMIN D3 PO) Take 1 tablet by mouth daily.     No current facility-administered medications on file prior to visit.    Allergies:  Allergies  Allergen Reactions   Trazodone  And Nefazodone Itching   Amitriptyline      Severe joint pain    Chlorhexidine  Itching   Penicillins Rash   Tape Rash    Clear tape   Vancomycin  Itching and Rash    Infusion related (red-man); give  slower   Past Medical History:  Past Medical History:  Diagnosis Date   Brain tumor (HCC)    Complication of anesthesia 2005   trouble waking   Headache    Seizure (HCC) 2020   UTI (urinary tract infection)    Past Surgical History:  Past Surgical History:  Procedure Laterality Date   ABDOMINAL SURGERY     APPLICATION OF CRANIAL NAVIGATION Left 12/08/2019   Procedure: APPLICATION OF CRANIAL NAVIGATION;  Surgeon: Debby Dorn MATSU, MD;  Location[facility] OR;  Service: Neurosurgery;  Laterality: Left;  APPLICATION OF CRANIAL NAVIGATION   APPLICATION OF CRANIAL NAVIGATION Left 07/03/2021   Procedure: APPLICATION OF CRANIAL NAVIGATION;  Surgeon: Debby Dorn MATSU, MD;  Location: [facility] OR;  Service: Neurosurgery;  Laterality: Left;   APPLICATION OF CRANIAL NAVIGATION Left 02/26/2022   Procedure: APPLICATION OF CRANIAL NAVIGATION;  Surgeon: Debby Dorn MATSU, MD;  Location: [facility] OR;  Service: Neurosurgery;  Laterality: Left;   CHEMO RESERVIOR INSERTION Left 02/26/2022   Procedure: FRONTAL PLACEMENT OF OMMAYA CHEMO RESERVOIR;  Surgeon: Debby Dorn MATSU, MD;  Location: [facility] OR;  Service: Neurosurgery;  Laterality: Left;   CRANIOTOMY Left 07/03/2021   Procedure: STEREOTACTIC DRAINAGE OF LEFT FRONTAL LOBE CYST;  Surgeon: Debby Dorn MATSU, MD;  Location: [facility] OR;  Service: Neurosurgery;  Laterality: Left;   FRAMELESS  BIOPSY WITH BRAINLAB Left 12/08/2019   Procedure: Left stereotactic brain biopsy with brainlab;  Surgeon: Debby Dorn MATSU, MD;  Location: [facility] OR;  Service: Neurosurgery;  Laterality: Left;  Left stereotactic brain biopsy with brainlab   WISDOM TOOTH EXTRACTION     Social History:  Social History   Socioeconomic History   Marital status: Married    Spouse name: Marty   Number of children: 0   Years of education: Not on file   Highest education level: Not on file  Occupational History   Occupation: artist  Tobacco Use   Smoking status: Never   Smokeless tobacco: Never  Vaping  Use   Vaping status: Never Used  Substance and Sexual Activity   Alcohol use: Yes    Alcohol/week: 4.0 standard drinks of alcohol    Types: 4 Glasses of wine per week    Comment: 2-3 times a week   Drug use: Not Currently    Types: Marijuana   Sexual activity: Yes  Other Topics Concern   Not on file  Social History Narrative   Not on file   Social Drivers of Health   Tobacco Use: Low Risk (09/23/2023)   Patient History    Smoking Tobacco Use: Never    Smokeless Tobacco Use: Never    Passive Exposure: Not on file  Financial Resource Strain: Not on file  Food Insecurity: Not on file  Transportation Needs: Not  on file  Physical Activity: Not on file  Stress: Not on file  Social Connections: Not on file  Intimate Partner Violence: Not on file  Depression (PHQ2-9): Not on file  Alcohol Screen: Not on file  Housing: Not on file  Utilities: Not on file  Health Literacy: Not on file   Family History:  Family History  Problem Relation Age of Onset   Migraines Mother    Thyroid  cancer Father    Testicular cancer Father     Review of Systems: Constitutional: Doesn't report fevers, chills or abnormal weight loss Eyes: Doesn't report blurriness of vision Ears, nose, mouth, throat, and face: Doesn't report sore throat Respiratory: Doesn't report cough, dyspnea or wheezes Cardiovascular: Doesn't report palpitation, chest discomfort  Gastrointestinal:  Doesn't report nausea, constipation, diarrhea GU: Doesn't report incontinence Skin: Doesn't report skin rashes Neurological: Per HPI Musculoskeletal: Doesn't report joint pain Behavioral/Psych: +anxiety  Physical Exam: Wt Readings from Last 3 Encounters:  03/10/24 137 lb (62.1 kg)  09/23/23 134 lb (60.8 kg)  09/03/23 137 lb 8 oz (62.4 kg)   Temp Readings from Last 3 Encounters:  03/10/24 98.2 F (36.8 C) (Temporal)  09/03/23 97.9 F (36.6 C)  03/05/23 97.7 F (36.5 C)   BP Readings from Last 3 Encounters:  03/10/24  117/80  09/23/23 102/70  09/03/23 114/72   Pulse Readings from Last 3 Encounters:  03/10/24 66  09/23/23 (!) 58  09/03/23 70    KPS: 70. General: Alert, cooperative, pleasant, in no acute distress Head: Normal EENT: No conjunctival injection or scleral icterus.  Lungs: Resp effort normal Cardiac: Regular rate Abdomen: Non-distended abdomen Skin: No rashes cyanosis or petechiae. Extremities: No clubbing or edema  Neurologic Exam: Mental Status: Awake, alert, attentive to examiner. Oriented to self and environment. Expressive dysphasia Cranial Nerves: Visual acuity is grossly normal. Visual fields are full. Extra-ocular movements intact. No ptosis. Face is symmetric Motor: Tone and bulk are normal. Right arm 4/5 with densely impaired distal motor coordination, R leg 4+/5. Reflexes are symmetric, no pathologic reflexes present.  Sensory: Intact to light touch Gait: Hemiparetic   Labs: I have reviewed the data as listed    Component Value Date/Time   NA 139 09/23/2023 1110   K 4.1 09/23/2023 1110   CL 103 09/23/2023 1110   CO2 23 09/23/2023 1110   GLUCOSE 87 09/23/2023 1110   BUN 15 09/23/2023 1110   CREATININE 0.89 09/23/2023 1110   CREATININE 0.99 08/20/2020 1205   CALCIUM 9.4 09/23/2023 1110   PROT 7.5 09/23/2023 1110   ALBUMIN 4.8 09/23/2023 1110   AST 16 09/23/2023 1110   AST 14 (L) 08/20/2020 1205   ALT 15 09/23/2023 1110   ALT 16 08/20/2020 1205   ALKPHOS 52 09/23/2023 1110   BILITOT 0.7 09/23/2023 1110   BILITOT 0.6 08/20/2020 1205   GFRNONAA >60 02/18/2022 1000   GFRNONAA >60 08/20/2020 1205   GFRAA >60 12/09/2019 0623   Lab Results  Component Value Date   WBC 6.3 09/23/2023   NEUTROABS 5.0 09/23/2023   HGB 14.9 09/23/2023   HCT 42.8 09/23/2023   MCV 91.0 09/23/2023   PLT 230.0 09/23/2023   Imaging:  [facility] Clinician Interpretation: I have personally reviewed the CNS images as listed.  My interpretation, in the context of the patient's clinical  presentation, is progressive disease  MR BRAIN WO CONTRAST Result Date: 03/03/2024 EXAM: MRI BRAIN WITHOUT CONTRAST 03/03/2024 11:54:00 AM TECHNIQUE: Multiplanar multisequence MRI of the head/brain was performed without the administration  of intravenous contrast. COMPARISON: Head MRI 08/27/2023. CLINICAL HISTORY: Brain/CNS neoplasm, assess treatment response. Follow up frontal lobe astrocytoma. FINDINGS: BRAIN AND VENTRICLES: A T2 hyperintense infiltrative mass is again seen involving cortex and white matter in the left frontal lobe, insula, and temporal lobe. The cystic component of the mass has substantially enlarged, now measuring 4.5 x 3.7 cm (previously 3.5 x 2.5 cm). An Ommaya reservoir remains in place with catheter terminating in the medial aspect of the cystic portion of the mass. There is extensive, confluent surrounding T2 hyperintensity in the white matter of the frontal and parietal lobes extending into the deep white matter tracts at the level of the basal ganglia and into the temporal lobe, markedly increased from prior. This most resembles edema but may also reflect tumor, and the more infiltrative tumoral type T2 hyperintensity involving cortex at the level of the left insula and temporal lobe has also mildly progressed. There is regional mass effect including partial effacement of the left lateral and third ventricles with 5 mm of rightward midline shift. Chronic blood products are again noted within the cystic portion of the mass. No acute infarct, hydrocephalus, or extra axial fluid collection is identified. A developmental venous anomaly and adjacent chronic microhemorrhage are again noted in the right frontal lobe. Normal flow voids. ORBITS: No acute abnormality. SINUSES AND MASTOIDS: No acute abnormality. BONES AND SOFT TISSUES: Normal marrow signal. No acute soft tissue abnormality. These results will be called to the ordering clinician or representative by the radiologist assistant, and  communication documented in the PACS or Clario dashboard. IMPRESSION: 1. Marked interval worsening with increased size of the cystic left frontal mass and extensive T2 hyperintensity throughout the left cerebral hemisphere, which may reflect a combination of new edema and progressive tumor. 2. Associated mass effect with 5 mm of rightward midline shift. Electronically signed by: Dasie Hamburg MD 03/03/2024 12:47 PM EST RP Workstation: [id number]     Assessment/Plan Astrocytoma of frontal lobe (HCC) [C71.1]  Waverley Krempasky is clinically progressive today, with worsening language, motor function and increased seizure frequency. MRI demonstrates clear increase in cyst volume, with increased surrounding T2/FLAIR signal abnormality.  Etiology of changes is either edema from cyst enlargement, gliomatosis and tumor infiltration, or both processes.  Recommended the following: -Cyst aspiration through Ommaya with Dr. Debby -Start decadron  4mg  daily -Repeat MRI brain in 1 month, she will not consent for gadolinium contrast -If changes persist, will recommend IDH inhibitor.  She will not consent for chemotherapy  We otherwise recommended continuing Keppra  2000mg  BID, Vimpat  150mg  BID for seizure prevention.  She does not wish to increase the Vimpat  at this time.  We ask that Chayce Rullo return to clinic in 1 month following next brain MRI, or sooner as needed.  All questions were answered. The patient knows to call the clinic with any problems, questions or concerns. No barriers to learning were detected.  The total time spent in the encounter was 40 minutes and more than 50% was on counseling and review of test results   Arthea MARLA Manns, MD Medical Director of Neuro-Oncology [facility] at [facility] [place] 03/10/2024 10:46 AM

## 2024-03-11 ENCOUNTER — Telehealth: Payer: Self-pay | Admitting: Internal Medicine

## 2024-03-11 ENCOUNTER — Other Ambulatory Visit: Payer: Self-pay

## 2024-03-11 NOTE — Telephone Encounter (Signed)
 Scheduled patient for next appointment. Called and left a voicemail with the details.

## 2024-03-13 ENCOUNTER — Other Ambulatory Visit: Payer: Self-pay

## 2024-03-18 ENCOUNTER — Encounter: Payer: Self-pay | Admitting: Internal Medicine

## 2024-03-22 ENCOUNTER — Inpatient Hospital Stay: Admitting: Internal Medicine

## 2024-03-22 DIAGNOSIS — C711 Malignant neoplasm of frontal lobe: Secondary | ICD-10-CM | POA: Diagnosis not present

## 2024-03-22 NOTE — Progress Notes (Signed)
 I connected with Candace [name] on 03/22/2024 at 10:00 AM EST by telephone visit and verified that I am speaking with the correct person using two identifiers.  I discussed the limitations, risks, security and privacy concerns of performing an evaluation and management service by telemedicine and the availability of in-person appointments. I also discussed with the patient that there may be a patient responsible charge related to this service. The patient expressed understanding and agreed to proceed.  Other persons participating in the visit and their role in the encounter:  spouse   Patient's location:  Home Provider's location:  Office Chief Complaint:  Astrocytoma of frontal lobe (HCC)  History of Present Ilness: Candace [name] reports a breakthrough seizure over the weekend.  She experienced worsening language deficits afterwards, but she is now back to prior baseline.  Doing ok with the decadron  4mg  daily, she feels it may be helping modestly with her right sided weakness and speech issues, despite the seizure.  Visited with Dr. Debby last week, seeking second surgical opinion at [facility].  Observations: Language and cognition at baseline  Assessment and Plan: Astrocytoma of frontal lobe (HCC)  Discussed risks and benefits of cyst resection, following Dr. Debby input this past week.    They are looking forward to meeting with Dr. Saturnino for a second opinion.  We will follow up with them shortly afterwards to revisit goals of care, treatment plan.  For now will con't decadron  4mg  daily as prior.  Follow Up Instructions: RTC following surgical evaluation at [facility]  I discussed the assessment and treatment plan with the patient.  The patient was provided an opportunity to ask questions and all were answered.  The patient agreed with the plan and demonstrated understanding of the instructions.    The patient was advised to call back or seek an in-person evaluation if the symptoms worsen  or if the condition fails to improve as anticipated.    Marvelous Bouwens K Field Staniszewski, MD   I provided 20 minutes of non face-to-face telephone visit time during this encounter, and > 50% was spent counseling as documented under my assessment & plan.

## 2024-04-01 ENCOUNTER — Telehealth: Payer: Self-pay | Admitting: *Deleted

## 2024-04-01 ENCOUNTER — Inpatient Hospital Stay: Attending: Internal Medicine | Admitting: Internal Medicine

## 2024-04-01 DIAGNOSIS — C719 Malignant neoplasm of brain, unspecified: Secondary | ICD-10-CM | POA: Diagnosis not present

## 2024-04-01 NOTE — Telephone Encounter (Signed)
 Received vm message from [facility], Dr. Dasie Roca nurse at [facility]. She states that Dr. Saturnino would like to discuss Candace [name]'s case with Dr. Buckley. Dr. Saturnino saw this pt in clinic at [facility] this week. TCT [place] and spoke with her. Provided Dr. Eward cell # to her so that Dr. Saturnino can get in touch with him.

## 2024-04-01 NOTE — Progress Notes (Signed)
 I connected with Candace [name] on 04/01/2024 at 10:00 AM EST by telephone visit and verified that I am speaking with the correct person using two identifiers.  I discussed the limitations, risks, security and privacy concerns of performing an evaluation and management service by telemedicine and the availability of in-person appointments. I also discussed with the patient that there may be a patient responsible charge related to this service. The patient expressed understanding and agreed to proceed.  Other persons participating in the visit and their role in the encounter:  spouse   Patient's location:  Home Provider's location:  Office Chief Complaint:  Astrocytoma brain tumor [facility])  History of Present Ilness: Candace [name] denies any clinical changes today.  She had productive meeting with [facility] surgical team earlier this week.  Denies any further seizures.  Continues on decadron  4mg  once daily.  Observations: Language and cognition at baseline  Assessment and Plan: Astrocytoma brain tumor [facility])  Discussed surgical plan put forward by [facility] team.  It seems they are favoring proceeding with cyst fluid diversion and biopsy.  We did our best to review risks and benefits of surgery in larger context of her potentially progressive astrocytoma.   Updated tissue would help guide treatment planning and could be sent for next-gen sequencing.    For now will con't decadron  4mg  daily as prior.  Follow Up Instructions: RTC following surgery or TBD  I discussed the assessment and treatment plan with the patient.  The patient was provided an opportunity to ask questions and all were answered.  The patient agreed with the plan and demonstrated understanding of the instructions.    The patient was advised to call back or seek an in-person evaluation if the symptoms worsen or if the condition fails to improve as anticipated.    Maddex Garlitz K Dsean Vantol, MD   I provided 20 minutes of non face-to-face  telephone visit time during this encounter, and > 50% was spent counseling as documented under my assessment & plan.

## 2024-04-02 ENCOUNTER — Other Ambulatory Visit: Payer: Self-pay | Admitting: Internal Medicine

## 2024-04-02 DIAGNOSIS — R569 Unspecified convulsions: Secondary | ICD-10-CM

## 2024-04-04 ENCOUNTER — Encounter: Payer: Self-pay | Admitting: Internal Medicine

## 2024-04-07 ENCOUNTER — Encounter: Payer: Self-pay | Admitting: Internal Medicine

## 2024-04-07 MED ORDER — DEXAMETHASONE 1 MG PO TABS: ORAL_TABLET | ORAL | 0 refills | Status: AC

## 2024-04-08 ENCOUNTER — Encounter: Payer: Self-pay | Admitting: Internal Medicine

## 2024-04-13 ENCOUNTER — Other Ambulatory Visit: Payer: Self-pay | Admitting: Radiation Therapy

## 2024-04-14 ENCOUNTER — Other Ambulatory Visit: Payer: Self-pay

## 2024-04-15 ENCOUNTER — Other Ambulatory Visit

## 2024-04-18 ENCOUNTER — Inpatient Hospital Stay: Admitting: Internal Medicine

## 2024-04-18 ENCOUNTER — Telehealth: Payer: Self-pay | Admitting: Internal Medicine

## 2024-04-18 NOTE — Telephone Encounter (Signed)
 Patients husband called to cancel his wife's appointment dor today, said she is getting surgery and that he will call back when they need to reschedule.

## 2024-04-23 ENCOUNTER — Encounter: Payer: Self-pay | Admitting: Internal Medicine

## 2024-04-27 ENCOUNTER — Other Ambulatory Visit: Payer: Self-pay

## 2024-05-01 ENCOUNTER — Other Ambulatory Visit: Payer: Self-pay

## 2024-05-02 ENCOUNTER — Inpatient Hospital Stay

## 2024-05-04 ENCOUNTER — Telehealth: Payer: Self-pay | Admitting: Internal Medicine

## 2024-05-04 NOTE — Telephone Encounter (Signed)
 Scheduled patient for Tuesday for a follow-up. Called and left a vm with details.

## 2024-05-10 ENCOUNTER — Inpatient Hospital Stay: Admitting: Internal Medicine

## 2024-05-16 ENCOUNTER — Inpatient Hospital Stay

## 2024-06-22 ENCOUNTER — Ambulatory Visit: Admitting: Nurse Practitioner
